# Patient Record
Sex: Male | Born: 1955 | Race: White | Hispanic: No | Marital: Married | Smoking: Former smoker
Health system: Southern US, Community
[De-identification: ages and names within clinical notes are randomized; demographics above are authoritative.]

## PROBLEM LIST (undated history)

## (undated) DIAGNOSIS — F419 Anxiety disorder, unspecified: Secondary | ICD-10-CM

## (undated) DIAGNOSIS — G4733 Obstructive sleep apnea (adult) (pediatric): Secondary | ICD-10-CM

## (undated) DIAGNOSIS — R030 Elevated blood-pressure reading, without diagnosis of hypertension: Secondary | ICD-10-CM

## (undated) DIAGNOSIS — R0609 Other forms of dyspnea: Secondary | ICD-10-CM

## (undated) DIAGNOSIS — IMO0002 Reserved for concepts with insufficient information to code with codable children: Secondary | ICD-10-CM

## (undated) DIAGNOSIS — Z0000 Encounter for general adult medical examination without abnormal findings: Secondary | ICD-10-CM

## (undated) DIAGNOSIS — E119 Type 2 diabetes mellitus without complications: Secondary | ICD-10-CM

## (undated) DIAGNOSIS — Z923 Personal history of irradiation: Secondary | ICD-10-CM

## (undated) DIAGNOSIS — H6091 Unspecified otitis externa, right ear: Secondary | ICD-10-CM

## (undated) DIAGNOSIS — J189 Pneumonia, unspecified organism: Secondary | ICD-10-CM

## (undated) DIAGNOSIS — R0989 Other specified symptoms and signs involving the circulatory and respiratory systems: Secondary | ICD-10-CM

## (undated) DIAGNOSIS — J301 Allergic rhinitis due to pollen: Secondary | ICD-10-CM

## (undated) DIAGNOSIS — T148XXA Other injury of unspecified body region, initial encounter: Secondary | ICD-10-CM

## (undated) DIAGNOSIS — M940 Chondrocostal junction syndrome [Tietze]: Secondary | ICD-10-CM

## (undated) DIAGNOSIS — E039 Hypothyroidism, unspecified: Secondary | ICD-10-CM

## (undated) DIAGNOSIS — E782 Mixed hyperlipidemia: Secondary | ICD-10-CM

## (undated) DIAGNOSIS — C4441 Basal cell carcinoma of skin of scalp and neck: Secondary | ICD-10-CM

## (undated) DIAGNOSIS — C801 Malignant (primary) neoplasm, unspecified: Secondary | ICD-10-CM

## (undated) DIAGNOSIS — M79661 Pain in right lower leg: Secondary | ICD-10-CM

## (undated) DIAGNOSIS — R0602 Shortness of breath: Secondary | ICD-10-CM

## (undated) DIAGNOSIS — E663 Overweight: Secondary | ICD-10-CM

## (undated) DIAGNOSIS — E118 Type 2 diabetes mellitus with unspecified complications: Secondary | ICD-10-CM

## (undated) DIAGNOSIS — E785 Hyperlipidemia, unspecified: Secondary | ICD-10-CM

## (undated) DIAGNOSIS — K219 Gastro-esophageal reflux disease without esophagitis: Secondary | ICD-10-CM

## (undated) DIAGNOSIS — B353 Tinea pedis: Secondary | ICD-10-CM

## (undated) DIAGNOSIS — E669 Obesity, unspecified: Secondary | ICD-10-CM

## (undated) DIAGNOSIS — R600 Localized edema: Secondary | ICD-10-CM

## (undated) DIAGNOSIS — E1165 Type 2 diabetes mellitus with hyperglycemia: Secondary | ICD-10-CM

## (undated) DIAGNOSIS — I10 Essential (primary) hypertension: Secondary | ICD-10-CM

## (undated) DIAGNOSIS — C4490 Unspecified malignant neoplasm of skin, unspecified: Secondary | ICD-10-CM

## (undated) DIAGNOSIS — C3490 Malignant neoplasm of unspecified part of unspecified bronchus or lung: Secondary | ICD-10-CM

## (undated) HISTORY — DX: Pneumonia, unspecified organism: J18.9

## (undated) HISTORY — DX: Other injury of unspecified body region, initial encounter: T14.8XXA

## (undated) HISTORY — PX: OTHER SURGICAL HISTORY: SHX169

## (undated) HISTORY — DX: Hyperlipidemia, unspecified: E78.5

## (undated) HISTORY — DX: Pain in right lower leg: M79.661

## (undated) HISTORY — DX: Malignant (primary) neoplasm, unspecified: C80.1

## (undated) HISTORY — DX: Other forms of dyspnea: R06.09

## (undated) HISTORY — DX: Allergic rhinitis due to pollen: J30.1

## (undated) HISTORY — DX: Shortness of breath: R06.02

## (undated) HISTORY — DX: Mixed hyperlipidemia: E78.2

## (undated) HISTORY — DX: Other specified symptoms and signs involving the circulatory and respiratory systems: R09.89

## (undated) HISTORY — DX: Obstructive sleep apnea (adult) (pediatric): G47.33

## (undated) HISTORY — DX: Overweight: E66.3

## (undated) HISTORY — DX: Type 2 diabetes mellitus with hyperglycemia: E11.65

## (undated) HISTORY — DX: Reserved for concepts with insufficient information to code with codable children: IMO0002

## (undated) HISTORY — PX: TONSILLECTOMY AND ADENOIDECTOMY: SHX28

## (undated) HISTORY — DX: Tinea pedis: B35.3

## (undated) HISTORY — DX: Basal cell carcinoma of skin of scalp and neck: C44.41

## (undated) HISTORY — DX: Encounter for general adult medical examination without abnormal findings: Z00.00

## (undated) HISTORY — DX: Localized edema: R60.0

## (undated) HISTORY — DX: Elevated blood-pressure reading, without diagnosis of hypertension: R03.0

## (undated) HISTORY — DX: Obesity, unspecified: E66.9

## (undated) HISTORY — DX: Anxiety disorder, unspecified: F41.9

## (undated) HISTORY — DX: Hypothyroidism, unspecified: E03.9

## (undated) HISTORY — DX: Unspecified otitis externa, right ear: H60.91

## (undated) HISTORY — DX: Type 2 diabetes mellitus with unspecified complications: E11.8

## (undated) HISTORY — DX: Chondrocostal junction syndrome (tietze): M94.0

## (undated) HISTORY — DX: Type 2 diabetes mellitus without complications: E11.9

---

## 2001-06-06 ENCOUNTER — Encounter: Admission: RE | Admit: 2001-06-06 | Discharge: 2001-09-04 | Payer: Self-pay | Admitting: Internal Medicine

## 2003-06-11 ENCOUNTER — Emergency Department (HOSPITAL_COMMUNITY): Admission: EM | Admit: 2003-06-11 | Discharge: 2003-06-11 | Payer: Self-pay

## 2003-09-07 ENCOUNTER — Encounter: Admission: RE | Admit: 2003-09-07 | Discharge: 2003-09-07 | Payer: Self-pay | Admitting: Family Medicine

## 2008-12-07 ENCOUNTER — Ambulatory Visit: Admission: RE | Admit: 2008-12-07 | Discharge: 2008-12-07 | Payer: Self-pay | Admitting: Family Medicine

## 2009-04-25 ENCOUNTER — Encounter: Payer: Self-pay | Admitting: Family Medicine

## 2009-10-23 ENCOUNTER — Ambulatory Visit: Payer: Self-pay | Admitting: Family Medicine

## 2009-10-23 DIAGNOSIS — E119 Type 2 diabetes mellitus without complications: Secondary | ICD-10-CM | POA: Insufficient documentation

## 2009-10-23 DIAGNOSIS — R0609 Other forms of dyspnea: Secondary | ICD-10-CM | POA: Insufficient documentation

## 2009-10-23 DIAGNOSIS — J301 Allergic rhinitis due to pollen: Secondary | ICD-10-CM

## 2009-10-23 DIAGNOSIS — G4733 Obstructive sleep apnea (adult) (pediatric): Secondary | ICD-10-CM

## 2009-10-23 DIAGNOSIS — E782 Mixed hyperlipidemia: Secondary | ICD-10-CM

## 2009-10-23 DIAGNOSIS — E663 Overweight: Secondary | ICD-10-CM

## 2009-10-23 DIAGNOSIS — Z9989 Dependence on other enabling machines and devices: Secondary | ICD-10-CM

## 2009-10-23 DIAGNOSIS — R0789 Other chest pain: Secondary | ICD-10-CM | POA: Insufficient documentation

## 2009-10-23 DIAGNOSIS — R0989 Other specified symptoms and signs involving the circulatory and respiratory systems: Secondary | ICD-10-CM

## 2009-10-23 HISTORY — DX: Other specified symptoms and signs involving the circulatory and respiratory systems: R09.89

## 2009-10-23 HISTORY — DX: Other specified symptoms and signs involving the circulatory and respiratory systems: R06.09

## 2009-10-23 HISTORY — DX: Overweight: E66.3

## 2009-10-23 HISTORY — DX: Mixed hyperlipidemia: E78.2

## 2009-10-23 HISTORY — DX: Obstructive sleep apnea (adult) (pediatric): G47.33

## 2009-10-23 HISTORY — DX: Type 2 diabetes mellitus without complications: E11.9

## 2009-10-23 HISTORY — DX: Allergic rhinitis due to pollen: J30.1

## 2009-10-25 LAB — CONVERTED CEMR LAB
Albumin: 4.4 g/dL (ref 3.5–5.2)
BUN: 18 mg/dL (ref 6–23)
Basophils Absolute: 0 10*3/uL (ref 0.0–0.1)
CO2: 28 meq/L (ref 19–32)
Calcium: 9 mg/dL (ref 8.4–10.5)
Chloride: 99 meq/L (ref 96–112)
Cholesterol: 166 mg/dL (ref 0–200)
Eosinophils Absolute: 0.2 10*3/uL (ref 0.0–0.7)
HCT: 44.8 % (ref 39.0–52.0)
HDL: 34 mg/dL — ABNORMAL LOW (ref >39.00)
Hemoglobin: 15.4 g/dL (ref 13.0–17.0)
Lymphs Abs: 2 10*3/uL (ref 0.7–4.0)
MCHC: 34.5 g/dL (ref 30.0–36.0)
MCV: 89.9 fL (ref 78.0–100.0)
Neutro Abs: 5 10*3/uL (ref 1.4–7.7)
RDW: 14.3 % (ref 11.5–14.6)
TSH: 2.39 microintl units/mL (ref 0.35–5.50)
Total Protein: 6.9 g/dL (ref 6.0–8.3)
VLDL: 99.4 mg/dL — ABNORMAL HIGH (ref 0.0–40.0)

## 2009-10-28 ENCOUNTER — Telehealth: Payer: Self-pay | Admitting: Family Medicine

## 2009-11-01 ENCOUNTER — Encounter: Payer: Self-pay | Admitting: Family Medicine

## 2009-11-04 ENCOUNTER — Encounter: Payer: Self-pay | Admitting: Family Medicine

## 2009-11-07 ENCOUNTER — Telehealth: Payer: Self-pay | Admitting: Family Medicine

## 2010-03-07 ENCOUNTER — Telehealth (INDEPENDENT_AMBULATORY_CARE_PROVIDER_SITE_OTHER): Payer: Self-pay | Admitting: *Deleted

## 2010-03-25 ENCOUNTER — Encounter (INDEPENDENT_AMBULATORY_CARE_PROVIDER_SITE_OTHER): Payer: Self-pay | Admitting: *Deleted

## 2010-03-25 ENCOUNTER — Emergency Department (HOSPITAL_COMMUNITY)
Admission: EM | Admit: 2010-03-25 | Discharge: 2010-03-26 | Payer: Self-pay | Source: Home / Self Care | Admitting: Emergency Medicine

## 2010-03-26 ENCOUNTER — Telehealth: Payer: Self-pay | Admitting: Family Medicine

## 2010-03-28 ENCOUNTER — Ambulatory Visit
Admission: RE | Admit: 2010-03-28 | Discharge: 2010-03-28 | Payer: Self-pay | Source: Home / Self Care | Attending: Family Medicine | Admitting: Family Medicine

## 2010-04-01 ENCOUNTER — Telehealth (INDEPENDENT_AMBULATORY_CARE_PROVIDER_SITE_OTHER): Payer: Self-pay | Admitting: *Deleted

## 2010-04-08 ENCOUNTER — Telehealth: Payer: Self-pay | Admitting: Family Medicine

## 2010-04-18 ENCOUNTER — Telehealth (INDEPENDENT_AMBULATORY_CARE_PROVIDER_SITE_OTHER): Payer: Self-pay | Admitting: *Deleted

## 2010-04-23 ENCOUNTER — Ambulatory Visit
Admission: RE | Admit: 2010-04-23 | Discharge: 2010-04-23 | Payer: Self-pay | Source: Home / Self Care | Attending: Family Medicine | Admitting: Family Medicine

## 2010-04-23 DIAGNOSIS — R030 Elevated blood-pressure reading, without diagnosis of hypertension: Secondary | ICD-10-CM | POA: Insufficient documentation

## 2010-04-23 DIAGNOSIS — J209 Acute bronchitis, unspecified: Secondary | ICD-10-CM | POA: Insufficient documentation

## 2010-04-23 HISTORY — DX: Elevated blood-pressure reading, without diagnosis of hypertension: R03.0

## 2010-04-24 ENCOUNTER — Telehealth: Payer: Self-pay | Admitting: Family Medicine

## 2010-04-25 ENCOUNTER — Telehealth: Payer: Self-pay | Admitting: Family Medicine

## 2010-04-29 NOTE — Assessment & Plan Note (Signed)
Summary: TO BE EST/NJR   Vital Signs:  Patient profile:   55 year old male Height:      78 inches (198.12 cm) Weight:      276 pounds (125.45 kg) BMI:     32.01 O2 Sat:      97 % on Room air Temp:     98.0 degrees F (36.67 degrees C) oral Pulse rate:   98 / minute BP sitting:   134 / 92  (left arm) Cuff size:   large  Vitals Entered By: Josph Macho RMA (October 23, 2009 8:08 AM)  O2 Flow:  Room air CC: Establis new pt/ CF Is Patient Diabetic? Yes   History of Present Illness: Patient in for new patient appt. His previous MD is leaving the area. He is a diabetic who has been having some right shoulder and atypical CP. He reports a roughly 3 week history of fleeting right shoulder pain, anterior and posteriorly, with some associated right upper chest wall pain which he says happens when he rotates his shoulder or does heavy lifting. The pain resolves when he moves his shoulder to a different position. He has an episode every few days and there are no associated symptoms such as SOB/palp/diaphoresis/nausea/heartburn. He does note about a 3 month history of mild increase in DOE, never at rest never awakening him from sleep.  He acknowledges doing a bad job of following a diabetic diet. He has been eating out a lot and eating lots of baked goods and carbs. His am sugars have been above 200 frequently in the past month  Preventive Screening-Counseling & Management  Alcohol-Tobacco     Alcohol drinks/day: <1     Smoking Status: quit  Caffeine-Diet-Exercise     Does Patient Exercise: no  Safety-Violence-Falls     Seat Belt Use: yes      Sexual History:  currently monogamous.        Drug Use:  never and no.    Current Problems (verified): 1)  Overweight  (ICD-278.02) 2)  Mixed Hyperlipidemia  (ICD-272.2) 3)  Sleep Apnea, Obstructive  (ICD-327.23) 4)  Allergic Rhinitis, Seasonal  (ICD-477.0)  Current Medications (verified): 1)  Januvia 100 Mg Tabs (Sitagliptin Phosphate) ....  Take 1 Tablet Once A Day 2)  Fenofibrate 160 Mg Tabs (Fenofibrate) .... Take 1 Tablet Once A Day 3)  Crestor 20 Mg Tabs (Rosuvastatin Calcium) .... Take 1/2-1 Tablet As Directed Once A Day At Bedtime 4)  Glipizide Xl 10 Mg Xr24h-Tab (Glipizide) .... Take 2 Tablets Every Day 5)  Metformin Hcl 500 Mg Tabs (Metformin Hcl) .... Take 2 Tablets Two Times A Day With Meals  Allergies (verified): No Known Drug Allergies  Past History:  Past Surgical History: Tonsillectomy & Adenoidectomy  Family History: Father: deceased@74 , stroke, hyperlipidemia, heart disease s/p valve replacement Mother: deceased@75 , CHF Siblings:  M1/2Brother: 46, hyperlipidemia M1/2Brother: 63< hyperlipidemia, overweight M1/2Brother: 60, overweight, back pain Brother: 7, panic attacks, hperlipidemia, HTN Brother: 14, hyperlidipemia MGM: deceased@79 , old age MGF: deceased in 72s PGM: deceased in 61s PGF: deceased in 55s Children: Son: 39, A&W  Social History: 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  The patient denies anorexia, fever, weight loss, weight gain, vision loss, decreased hearing, hoarseness, syncope, peripheral edema, prolonged cough, headaches, hemoptysis, abdominal pain, melena, hematochezia, severe indigestion/heartburn, hematuria, incontinence, muscle weakness, suspicious skin lesions, transient blindness, difficulty walking, depression, unusual weight change, abnormal bleeding, and enlarged lymph nodes.    Physical Exam  General:  Well-developed,well-nourished,in no acute distress; alert,appropriate and cooperative throughout examination Head:  Normocephalic and  atraumatic without obvious abnormalities. No apparent alopecia or balding. Eyes:  No corneal or conjunctival inflammation noted. EOMI. Perrla. Funduscopic exam benign, without hemorrhages, exudates or papilledema. Vision grossly normal. Ears:  External ear exam shows no significant lesions or deformities.  Otoscopic examination reveals clear canals, tympanic membranes are intact bilaterally without bulging, retraction, inflammation or discharge. Hearing is grossly normal bilaterally. Nose:  External nasal examination shows no deformity or inflammation. Nasal mucosa are pink and moist without lesions or exudates. Mouth:  Oral mucosa and oropharynx without lesions or exudates.  Teeth in good repair. Neck:  No deformities, masses, or tenderness noted. Lungs:  Normal respiratory effort, chest expands symmetrically. Lungs are clear to auscultation, no crackles or wheezes. Heart:  Normal rate and regular rhythm. S1 and S2 normal without gallop, murmur, click, rub or other extra sounds. Abdomen:  Bowel sounds positive,abdomen soft and non-tender without masses, organomegaly or hernias noted. Msk:  No deformity or scoliosis noted of thoracic or lumbar spine.   Pulses:  R and L carotid,radial,femoral,dorsalis pedis and posterior tibial pulses are full and equal bilaterally Extremities:  No clubbing, cyanosis, edema, or deformity noted with normal full range of motion of all joints.   Neurologic:  No cranial nerve deficits noted. Station and gait are normal. Plantar reflexes are down-going bilaterally. DTRs are symmetrical throughout. Sensory, motor and coordinative functions appear intact. Skin:  Intact without suspicious lesions or rashes. Scattered cherry angiomas and benign appearing freckles on trunk Cervical Nodes:  No lymphadenopathy noted Psych:  Cognition and judgment appear intact. Alert and cooperative with normal attention span and concentration. No apparent delusions, illusions,  hallucinations   Impression & Recommendations:  Problem # 1:  DIABETES MELLITUS, TYPE II (ICD-250.00)  His updated medication list for this problem includes:    Januvia 100 Mg Tabs (Sitagliptin phosphate) .Marland Kitchen... Take 1 tablet once a day    Glipizide Xl 10 Mg Xr24h-tab (Glipizide) .Marland Kitchen... Take 2 tablets every day    Metformin Hcl 500 Mg Tabs (Metformin hcl) .Marland Kitchen... Take 2 tablets two times a day with meals  Orders: TLB-Renal Function Panel (80069-RENAL) TLB-CBC Platelet - w/Differential (85025-CBCD) TLB-A1C / Hgb A1C (Glycohemoglobin) (83036-A1C) Cardiology Referral (Cardiology) Cardio-Pulmonary Stress Test Referral (Cardio-Pulmon) discussed at length need to avoid simple carbs, use complex carbs, lean proteins, increase exercise and monitor sugars daily and prn  Problem # 2:  CHEST PAIN, ATYPICAL (ICD-786.59)  Orders: Cardiology Referral (Cardiology) Cardio-Pulmonary Stress Test Referral (Cardio-Pulmon) Likely musculoskeletal but with significant risk factors will refer for further testing at this time  Problem # 3:  MIXED HYPERLIPIDEMIA (ICD-272.2)  His updated medication list for this problem includes:    Fenofibrate 160 Mg Tabs (Fenofibrate) .Marland Kitchen... Take 1 tablet once a day    Crestor 20 Mg Tabs (Rosuvastatin calcium) .Marland Kitchen... Take 1/2-1 tablet as directed once a day at bedtime  Orders: TLB-Renal Function Panel (80069-RENAL) TLB-Hepatic/Liver Function Pnl (80076-HEPATIC) TLB-Lipid Panel (80061-LIPID) TLB-A1C / Hgb A1C (Glycohemoglobin) (24401-U2V) Cardiology Referral (Cardiology) Cardio-Pulmonary Stress Test Referral (Cardio-Pulmon) Avoid trans fats, minimize saturated fats and await test results  Problem # 4:  OVERWEIGHT (  ICD-278.02)  Orders: TLB-TSH (Thyroid Stimulating Hormone) (03474-QVZ) Cardiology Referral (Cardiology) Cardio-Pulmonary Stress Test Referral (Cardio-Pulmon) Encouraged walking 30 minutes daily and decrease serving sizes  Problem # 5:  SLEEP APNEA,  OBSTRUCTIVE (ICD-327.23)  Orders: Cardiology Referral (Cardiology) Cardio-Pulmonary Stress Test Referral (Cardio-Pulmon) Continue CPAP use nightly  Complete Medication List: 1)  Januvia 100 Mg Tabs (Sitagliptin phosphate) .... Take 1 tablet once a day 2)  Fenofibrate 160 Mg Tabs (Fenofibrate) .... Take 1 tablet once a day 3)  Crestor 20 Mg Tabs (Rosuvastatin calcium) .... Take 1/2-1 tablet as directed once a day at bedtime 4)  Glipizide Xl 10 Mg Xr24h-tab (Glipizide) .... Take 2 tablets every day 5)  Metformin Hcl 500 Mg Tabs (Metformin hcl) .... Take 2 tablets two times a day with meals  Patient Instructions: 1)  Please schedule a follow-up appointment in 3 months .  2)  It is important that you exercise reguarly at least 20 minutes 5 times a week. If you develop chest pain, have severe difficulty breathing, or feel very tired, stop exercising immediately and seek medical attention.  3)  You need to lose weight. Consider a lower calorie diet and regular exercise.  4)  Check your blood sugars regularly. If your readings are usually above:  or below 70 you should contact our office.  5)  Check your feet each night  for sore areas, calluses or signs of infection.  6)  BMP prior to visit, ICD-9: 250.0 7)  Hepatic Panel prior to visit ICD-9: 272.0 8)  Lipid panel prior to visit ICD-9 : 272.0 9)  TSH prior to visit ICD-9 : 250.0 10)  CBC w/ Diff prior to visit ICD-9 : 250.0 11)  HgBA1c prior to visit  ICD-9: 250.0 12)  Urine Microalbumin prior to visit ICD-9 : 250.0 13)  Release of Records [facility], Dr Joselyn Arrow   Appended Document: Orders Update    Clinical Lists Changes  Orders: Added new Service order of Venipuncture (56387) - Signed Added new Service order of Specimen Handling (56433) - Signed

## 2010-04-29 NOTE — Progress Notes (Signed)
  Phone Note Call from Patient   Caller: Patient Call For: Danise Edge MD Summary of Call: Calling regarding stress test?  Please call [phone] Initial call taken by: [facility] CMA,  October 28, 2009 10:41 AM  Follow-up for Phone Call        I called pt and explained we were waiting to hear back from his Insurance co.  I will request consult appt for Cardiologist and call pt back with appt info. Follow-up by: Corky Mull,  October 28, 2009 11:30 AM

## 2010-04-29 NOTE — Miscellaneous (Signed)
Summary: Orders Update  Clinical Lists Changes  Orders: Added new Referral order of Cardiolite (Cardiolite) - Signed 

## 2010-04-29 NOTE — Progress Notes (Signed)
Summary: med refills  Phone Note Call from Patient Call back at Home Phone [phone]   Caller: Patient Call For: Danise Edge MD Summary of Call: pt needs metformin 500mg  ,crestor20 mg ,glipizde 10 mg xr,januvia 100 mg and fenofibrate 160 mg [phone] pt is out of meds Initial call taken by: Heron Sabins,  November 07, 2009 4:15 PM    Prescriptions: METFORMIN HCL 500 MG TABS (METFORMIN HCL) take 2 tablets two times a day with meals  #180 x 3   Entered by:   Lynann Beaver CMA   Authorized by:   Danise Edge MD   Signed by:   Lynann Beaver CMA on 11/07/2009   Method used:   [facility] [phone][facility] (retail)       [address]       [place], [place]  [address]       Ph: [phone] or [phone]       Fax: [phone]   RxID:   [id number] GLIPIZIDE XL 10 MG XR24H-TAB (GLIPIZIDE) Take 2 tablets every day  #90 x 3   Entered by:   Lynann Beaver CMA   Authorized by:   Danise Edge MD   Signed by:   Lynann Beaver CMA on 11/07/2009   Method used:   [facility]ically to        [facility] [phone]* (retail)       [address]       [place], [place]  [address]       Ph: [phone] or [phone]       Fax: [phone]   RxID:   [id number] CRESTOR 20 MG TABS (ROSUVASTATIN CALCIUM) take 1/2-1 tablet as directed once a day at bedtime  #90 x 3   Entered by:   Lynann Beaver CMA   Authorized by:   Danise Edge MD   Signed by:   Lynann Beaver CMA on 11/07/2009   Method used:   Electronically to        [facility] [phone]* (retail)       [address]       [place], [place]  [address]       Ph: [phone] or [phone]       Fax: [phone]   RxID:   [id number] FENOFIBRATE 160 MG TABS (FENOFIBRATE) take 1 tablet once a day  #90 x 3   Entered by:   Lynann Beaver CMA   Authorized by:   Danise Edge MD   Signed by:   Lynann Beaver CMA on 11/07/2009   Method used:    Electronically to        [facility] [phone]* (retail)       [address]       [place], [place]  [address]       Ph: [phone] or [phone]       Fax: [phone]   RxID:   [id number] JANUVIA 100 MG TABS (SITAGLIPTIN PHOSPHATE) take 1 tablet once a day  #90 x 3   Entered by:   Lynann Beaver CMA   Authorized by:   Danise Edge MD   Signed by:   Lynann Beaver CMA on 11/07/2009   Method used:   Electronically to  [facility]  [address] [phone]* (retail)       [address]       [place], [place]  [address]       Ph: [phone] or [phone]       Fax: [phone]   RxID:   [id number]

## 2010-04-29 NOTE — Letter (Signed)
Summary: Records from [facility] 2009 - 2011  Records from [facility] 2009 - 2011   Imported By: Maryln Gottron 11/07/2009 10:14:04  _____________________________________________________________________  External Attachment:    Type:   Image     Comment:   External Document

## 2010-04-29 NOTE — Progress Notes (Signed)
Summary: Flu vaccination    Immunization History:  Influenza Immunization History:    Influenza:  historical (02/11/2010)     Review of Systems       [facility] [place]

## 2010-04-29 NOTE — Miscellaneous (Signed)
Summary: Appointment Canceled  Appointment status changed to canceled by [employer] on 11/04/2009 12:03 PM.  Cancellation Comments --------------------- crs/dx:chest pain/wt:276/ins:bcbs/Dr blyth  Appointment Information ----------------------- Appt Type:  CARDIOLOGY NUCLEAR TESTING      Date:  Thursday, November 07, 2009      Time:  8:00 AM for 15 min   Urgency:  Routine   Made By:  Pearson Grippe  To Visit:  [id number]    Reason:  crs/dx:chest pain/wt:276/ins:bcbs/Dr blyth  Appt Comments ------------- -- 11/04/09 12:03: ([facility]) CANCELED -- crs/dx:chest pain/wt:276/ins:bcbs/Dr blyth -- 11/01/09 10:15: ([facility]EM[facility]) BOOKED -- Routine CARDIOLOGY NUCLEAR TESTING at 11/07/2009 8:00 AM for 15 min crs/dx:chest pain/wt:276/ins:bcbs/Dr blyth

## 2010-05-01 NOTE — Assessment & Plan Note (Signed)
Summary: DISCUSS ELEVATED BLOOD SUGARS//SP   Vital Signs:  Patient profile:   55 year old male Height:      78 inches Weight:      270 pounds BMI:     31.31 Pulse rate:   112 / minute BP sitting:   131 / 90  (right arm) Cuff size:   large  Vitals Entered By: Francee Piccolo CMA Duncan Dull) (March 28, 2010 8:35 AM) CC: discuss elevated blood sugars/SP Is Patient Diabetic? Yes   History of Present Illness: 55 y/o WM here for emergency dept visit follow up. Last o/v was 09/2009, at which time his HbA1c was 10% and he increased his metformin. Has been compliant with meds, says fasting glucose range is 240-270, occasional 2H PP check reveals same range.  No hypoglycemia.      Developed acute GI illness 03/22/10---n/v, abd cramps, subjective fever, malaise.  Two other family members developed the same symptoms.  He presented to [facility] ED 03/25/10, where he was given some IVF and phenergan, labs done (all normal except glucose was 340).  He has felt much better since then, feels almost back to normal now.  Didn't f/u as scheduled after 09/2009 visit b/c of some work issues that required extra long hours. No exercise.  Adheres to diabetic diet poorly most of the time (has "spurts" of doing good with this).  Medications Prior to Update: 1)  Januvia 100 Mg Tabs (Sitagliptin Phosphate) .... Take 1 Tablet Once A Day 2)  Fenofibrate 160 Mg Tabs (Fenofibrate) .... Take 1 Tablet Once A Day 3)  Crestor 20 Mg Tabs (Rosuvastatin Calcium) .... Take 1/2-1 Tablet As Directed Once A Day At Bedtime 4)  Glipizide Xl 10 Mg Xr24h-Tab (Glipizide) .... Take 2 Tablets Every Day 5)  Metformin Hcl 500 Mg Tabs (Metformin Hcl) .... Take 2 Tablets Two Times A Day With Meals  Current Medications (verified): 1)  Januvia 100 Mg Tabs (Sitagliptin Phosphate) .... Take 1 Tablet Once A Day 2)  Fenofibrate 160 Mg Tabs (Fenofibrate) .... Take 1 Tablet Once A Day 3)  Crestor 20 Mg Tabs (Rosuvastatin Calcium) .... Take 1/2-1  Tablet As Directed Once A Day At Bedtime 4)  Glipizide Xl 10 Mg Xr24h-Tab (Glipizide) .... Take 2 Tablets Every Day 5)  Metformin Hcl 500 Mg Tabs (Metformin Hcl) .... Take 2 Tablets Two Times A Day With Meals 6)  Promethazine Hcl 25 Mg Tabs (Promethazine Hcl) .... Take 1 Tablet By Mouth Every 6  Hours As Needed As Nausea  Allergies (verified): No Known Drug Allergies  Past History:  Past Surgical History: Last updated: 11-07-09 Tonsillectomy & Adenoidectomy  Family History: Last updated: 11-07-09 Father: deceased@74 , stroke, hyperlipidemia, heart disease s/p valve replacement Mother: deceased@75 , CHF Siblings:  M1/2Brother: 56, hyperlipidemia M1/2Brother: 63< hyperlipidemia, overweight M1/2Brother: 60, overweight, back pain Brother: 28, panic attacks, hperlipidemia, HTN Brother: 19, hyperlidipemia MGM: deceased@79 , old age MGF: deceased in 85s PGM: deceased in 90s PGF: deceased in 49s Children: Son: 74, A&W  Social History: Last updated: 11-07-09 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 Hyperlipidemia OSA Obesity  Review of Systems  The patient denies weight loss, weight gain, vision loss, decreased hearing, hoarseness, chest pain, syncope, dyspnea on exertion, peripheral edema, prolonged cough, hemoptysis, melena, hematochezia, hematuria, incontinence, genital sores,  muscle weakness, suspicious skin lesions, transient blindness, difficulty walking, depression, unusual weight change, abnormal bleeding, enlarged lymph nodes, angioedema, breast masses, and testicular masses.    Physical Exam  General:  VS: noted, all normal. Gen: Alert, well appearing, oriented x 4. HEENT: Scalp without lesions or hair  loss.  Ears: EACs clear, normal epithelium.  TMs with good light reflex and landmarks bilaterally.  Eyes: no injection, icteris, swelling, or exudate.  EOMI, PERRLA. Nose: no drainage or turbinate edema/swelling.  No inection or focal lesion.  Mouth: lips without lesion/swelling.  Oral mucosa pink and moist.  Dentition intact and without obvious caries or gingival swelling.  Oropharynx without erythema, exudate, or swelling.  Neck: supple.  No lymphadenopathy, thyromegaly, or mass. Chest: symmetric expansion, with nonlabored respirations.  Clear and equal breath sounds in all lung fields.   CV: RRR, no m/r/g.  Peripheral pulses 2+/symmetric. ABD: soft, NT, ND, BS normal.  No hepatospenomegaly or mass.  No bruits. EXT: no clubbing, cyanosis, or edema.     Impression & Recommendations:  Problem # 1:  GASTROENTERITIS, ACUTE (ICD-558.9) Assessment New This has now resolved.  Entire record from ED visit 03/25/10 was reviewed today.  Problem # 2:  DIABETES MELLITUS, TYPE II (ICD-250.00) Assessment: Deteriorated His control continues to be poor.  Somewhat poor compliance with f/u. Discussed options and decided to d/c januvia and glipizide and start lantus at 10 units at bedtime. We discussed titration of lantus to get to a goal fasting glucose range of 100-110. Continue metformin 1000 mg two times a day.  Elected NOT to do HbA1c or lipids today. He'll call in 2 wks with report of his glucoses, and he'll return to see Dr. Abner Greenspan for o/v in 6 wks.  The following medications were removed from the medication list:    Januvia 100 Mg Tabs (Sitagliptin phosphate) .Marland Kitchen... Take 1 tablet once a day    Glipizide Xl 10 Mg Xr24h-tab (Glipizide) .Marland Kitchen... Take 2 tablets every day His updated medication list for this problem includes:    Metformin Hcl 500 Mg Tabs (Metformin hcl) .Marland Kitchen... Take 2 tablets two times a day with meals    Lantus Solostar 100 Unit/ml Soln (Insulin glargine) .Marland KitchenMarland KitchenMarland KitchenMarland Kitchen 10 u subq at  bedtime  Complete Medication List: 1)  Fenofibrate 160 Mg Tabs (Fenofibrate) .... Take 1 tablet once a day 2)  Crestor 20 Mg Tabs (Rosuvastatin calcium) .... Take 1/2-1 tablet as directed once a day at bedtime 3)  Metformin Hcl 500 Mg Tabs (Metformin hcl) .... Take 2 tablets two times a day with meals 4)  Promethazine Hcl 25 Mg Tabs (Promethazine hcl) .... Take 1 tablet by mouth every 6  hours as needed as nausea 5)  Lantus Solostar 100 Unit/ml Soln (Insulin glargine) .Marland Kitchen.. 10 u subq at bedtime  Patient Instructions: 1)  Arrange f/u appt in 6 wks with Dr. Abner Greenspan. 2)  Call in 2 wks and report your blood sugars to Dr. Mariel Aloe nurse. 3)  Call or return as needed for any problems. Prescriptions: LANTUS SOLOSTAR 100 UNIT/ML SOLN (INSULIN GLARGINE) 10 U subQ at bedtime  #1 box x 3   Entered and Authorized by:   Michell Heinrich M.D.   Signed by:   Michell Heinrich M.D. on 03/28/2010   Method used:   [facility] to        [facility] [phone]* (retail)       [address]       [place]  [place], [place]  [address]       Ph: [phone] or [phone]       Fax: [phone]   RxID:   [id number]    Orders Added: 1)  Est. Patient Level IV [03474]

## 2010-05-01 NOTE — Progress Notes (Signed)
Summary: Lantus Refill  Phone Note Refill Request Call back at Work Phone [phone] Message from:  Patient on April 24, 2010 9:25 AM  pt only received 1 box of Lantus which will only last 5 days, he needs more   Method Requested: Electronic Initial call taken by: Lannette Donath,  April 24, 2010 9:27 AM  Follow-up for Phone Call        I left a message for pt to return my call. RX that was sent for 3 pens X3? Follow-up by: Josph Macho RMA,  April 24, 2010 10:04 AM  Additional Follow-up for Phone Call Additional follow up Details #1::        Pt states he received a box with 5 pens. Pt states there is 100 units in each pen. Pt needs a new RX sent to pharmacy? Additional Follow-up by: Josph Macho RMA,  April 24, 2010 11:53 AM    Additional Follow-up for Phone Call Additional follow up Details #2::    rewrote rx for 4 boxes instead of pens and resent to pharmacy, please check with pharmacy and notify patient Follow-up by: Danise Edge MD,  April 24, 2010 12:14 PM  Additional Follow-up for Phone Call Additional follow up Details #3:: Details for Additional Follow-up Action Taken: Spoke with pharmacist and pen has 300 units in it and pt was given 5 pens. Pharmacist states that is enough for roughly 24 days. Pharmacist states that she believes pts insurance wouldn' t pay for anymore? Pharmacist is going to look into it further.  Pt informed and is going to contact pharmacy. Additional Follow-up by: Josph Macho RMA,  April 24, 2010 12:37 PM  Prescriptions: LANTUS SOLOSTAR 100 UNIT/ML SOLN (INSULIN GLARGINE) 50 units subcutaneously in am 10 units subcutaneously in pm  #4 boxes x 3   Entered and Authorized by:   Danise Edge MD   Signed by:   Danise Edge MD on 04/24/2010   Method used:   Electronically to        [facility] [phone]* (retail)       [address]       [place], [place]  [address]       Ph: [phone]       Fax:  [phone]   RxID:   [id number]

## 2010-05-01 NOTE — Progress Notes (Signed)
Summary: question about high blood sugar  Phone Note Call from Patient Call back at Work Phone [phone]   Caller: Patient Reason for Call: Talk to Nurse Summary of Call: Pt went to the hospital 03/25/10 and blood sugar 340, pls call, pt has questions about how to give himself insulin Initial call taken by: Lannette Donath,  March 26, 2010 4:32 PM  Follow-up for Phone Call        [facility] to pt.  Pt was seen in ER on 12/27 for nausea and vomiting.  Pt was given IV fluids and phenergan and feels somewhat better today.   Pt was also noted to have an elevated glucose.  Pt was given insulin while in the ED.  Pt states he continues to take oral diabetic meds.   Pt will come for appt on 12/30 to see Dr. Milinda Cave to eval elevated blood sugars. Follow-up by: Francee Piccolo CMA Duncan Dull),  March 26, 2010 5:00 PM

## 2010-05-01 NOTE — Assessment & Plan Note (Signed)
Summary: follow up, meds ,sugar check/vfw   Vital Signs:  Patient profile:   55 year old male Height:      78 inches (198.12 cm) Weight:      278.50 pounds (126.59 kg) O2 Sat:      95 % on Room air Temp:     97.4 degrees F (36.33 degrees C) oral Pulse rate:   90 / minute BP sitting:   145 / 97  (right arm) Cuff size:   large  Vitals Entered By: Josph Macho RMA (April 23, 2010 8:25 AM)  O2 Flow:  Room air  Serial Vital Signs/Assessments:  Time      Position  BP       Pulse  Resp  Temp     By                     148/90                         Danise Edge MD  CC: Follow up visit, meds, sugar- 260 in am, 260 in pm/ CF Is Patient Diabetic? Yes   History of Present Illness: Patient is a 55 yo Caucasian male in today for follow up on multiple medical problems and diabetes. He has changed his Lantus to am and is up to 52 units but continues to numbers consistently in the 200s. Yesterday in am and in pm it was 260. The lowest he has seen is 190 once. his numbers are down from the 300s and he does report feeling less fatigued and is struggling for his polyuria and polydipsia. He has been struggling with congestion going on 2 weeks now. He notes some dry cough irritated throat some increased shortness of breath. He has been using nyquil intermittently and did use it last night and he does believe it helps him sleep better. Denies fevers, chills, rhinorrhea, ear pain, headache, GI or GU complaints. He has tried to move more taking some short walks and is trying to avoid simple carbs and heavy meals. He is eating smaller amounts more frequently does believe that is helping him to feel somewhat better.  Anticoagulation Management History:      Positive risk factors for bleeding include presence of serious comorbidities.  Negative risk factors for bleeding include an age less than 57 years old.  The bleeding index is 'intermediate risk'.  Positive CHADS2 values include History of Diabetes.   Negative CHADS2 values include Age > 62 years old.     Current Medications (verified): 1)  Fenofibrate 160 Mg Tabs (Fenofibrate) .... Take 1 Tablet Once A Day 2)  Crestor 20 Mg Tabs (Rosuvastatin Calcium) .... Take 1/2-1 Tablet As Directed Once A Day At Bedtime 3)  Metformin Hcl 500 Mg Tabs (Metformin Hcl) .... Take 2 Tablets Two Times A Day With Meals 4)  Promethazine Hcl 25 Mg Tabs (Promethazine Hcl) .... Take 1 Tablet By Mouth Every 6  Hours As Needed As Nausea 5)  Lantus Solostar 100 Unit/ml Soln (Insulin Glargine) .... 52 U Subq in Am 6)  Accu-Chek Multiclix Lancets  Misc (Lancets) .... Use As Directed 7)  Accu-Chek Aviva  Strp (Glucose Blood) .... Check 3-4 Times A Day-Use As Directed  Allergies (verified): No Known Drug Allergies  Past History:  Past medical history reviewed for relevance to current acute and chronic problems. Social history (including risk factors) reviewed for relevance to current acute and chronic problems.  Past Medical History:  Reviewed history from 03/28/2010 and no changes required. DM 2, dx'd approx 2003. Hyperlipidemia OSA Obesity  Social History: Reviewed history from 10/23/2009 and no changes required. Occupation: deliver auto parts for [employer] Married Former Smoker started @18  stopped @ 27, 1ppd Alcohol use-yes, rarely Drug use-no  Review of Systems      See HPI  Physical Exam  General:  Well-developed,well-nourished,in no acute distress; alert,appropriate and cooperative throughout examination Head:  Normocephalic and atraumatic without obvious abnormalities. No apparent alopecia or balding. Mouth:  Oral mucosa and oropharynx without lesions or exudates.  Teeth in good repair. Mild erythema in oropharynx Neck:  No deformities, masses, or tenderness noted. Lungs:  Normal respiratory effort, chest expands symmetrically. Lungs with good aeration but scattered rhonchi Heart:  Normal rate and regular rhythm. S1 and S2 normal without gallop,  murmur, click, rub or other extra sounds. Abdomen:  Bowel sounds positive,abdomen soft and non-tender without masses, organomegaly or hernias noted. Msk:  No deformity or scoliosis noted of thoracic or lumbar spine.   Extremities:  No clubbing, cyanosis, edema, or deformity noted with normal full range of motion of all joints.   Skin:  Intact without suspicious lesions or rashes. Psych:  Cognition and judgment appear intact. Alert and cooperative with normal attention span and concentration. No apparent delusions, illusions, hallucinations   Impression & Recommendations:  Problem # 1:  DIABETES MELLITUS, TYPE II (ICD-250.00)  His updated medication list for this problem includes:    Metformin Hcl 500 Mg Tabs (Metformin hcl) .Marland Kitchen... Take 2 tablets two times a day with meals    Lantus Solostar 100 Unit/ml Soln (Insulin glargine) .Marland KitchenMarland KitchenMarland KitchenMarland Kitchen 50 units subcutaneously in am 10 units subcutaneously in pm    Lisinopril 10 Mg Tabs (Lisinopril) .Marland Kitchen... 1 tab by mouth daily, increase by 2 units every 3 days start by increasing in am and 3 days later increase pm dose if indicated Avoid simple carbs, use lean proteins, small frequent meals, increase exercise call with any concerning numbers  Problem # 2:  ELEVATED BLOOD PRESSURE (ICD-796.2)  His updated medication list for this problem includes:    Lisinopril 10 Mg Tabs (Lisinopril) .Marland Kitchen... 1 tab by mouth daily Avoid sodium, DM, D and Nyquil  Problem # 3:  GASTROENTERITIS, ACUTE (ICD-558.9) Resolved, no concerns  Problem # 4:  ACUTE BRONCHITIS (ICD-466.0)  His updated medication list for this problem includes:    Zithromax 250 Mg Tabs (Azithromycin) .Marland Kitchen... 2 tabs by mouth once and then 1 tab by mouth daily x 4 day    Mucinex 600 Mg Xr12h-tab (Guaifenesin) .Marland Kitchen... 1 tab by mouth two times a day x 10 day Stop Nyquil  Problem # 5:  ALLERGIC RHINITIS, SEASONAL (ICD-477.0) May cont Zyrtec in am and try Diphenhydramine at bedtime as needed   Problem # 6:  MIXED  HYPERLIPIDEMIA (ICD-272.2)  His updated medication list for this problem includes:    Fenofibrate 160 Mg Tabs (Fenofibrate) .Marland Kitchen... Take 1 tablet once a day    Crestor 20 Mg Tabs (Rosuvastatin calcium) .Marland Kitchen... Take 1/2-1 tablet as directed once a day at bedtime Avoid trans fats and continue efforts at weight loss and increased exercise  Complete Medication List: 1)  Fenofibrate 160 Mg Tabs (Fenofibrate) .... Take 1 tablet once a day 2)  Crestor 20 Mg Tabs (Rosuvastatin calcium) .... Take 1/2-1 tablet as directed once a day at bedtime 3)  Metformin Hcl 500 Mg Tabs (Metformin hcl) .... Take 2 tablets two times a day with meals 4)  Promethazine  Hcl 25 Mg Tabs (Promethazine hcl) .... Take 1 tablet by mouth every 6  hours as needed as nausea 5)  Lantus Solostar 100 Unit/ml Soln (Insulin glargine) .... 50 units subcutaneously in am 10 units subcutaneously in pm 6)  Accu-chek Multiclix Lancets Misc (Lancets) .... Use as directed 7)  Accu-chek Aviva Strp (Glucose blood) .... Check 3-4 times a day-use as directed 8)  Zithromax 250 Mg Tabs (Azithromycin) .... 2 tabs by mouth once and then 1 tab by mouth daily x 4 day 9)  Benadryl 25 Mg Tabs (Diphenhydramine hcl) .Marland Kitchen.. 1-2 tabs by mouth at bedtime as needed congestion/insomnia 10)  Mucinex 600 Mg Xr12h-tab (Guaifenesin) .Marland Kitchen.. 1 tab by mouth two times a day x 10 day 11)  Left Knee Soft Brace  .... Wear daily dx: knee instability 12)  Bd Ultra-fine Pen Needles, Mini 31 Gauge, 5mm  .... Use as directed to administer lantus two times a day 13)  Lisinopril 10 Mg Tabs (Lisinopril) .Marland Kitchen.. 1 tab by mouth daily 14)  Zyrtec Allergy 10 Mg Tabs (Cetirizine hcl) .Marland Kitchen.. 1 tab by mouth once daily as needed allergies  Patient Instructions: 1)  Please schedule a follow-up appointment in 1 month or as needed 2)  Avoid simple carbs, continue to exercise, track blood sugars carefully 3)  Increase Lantus as directed 4)  Take your antibiotic as prescribed until ALL of it is gone,  but stop if you develop a rash or swelling and contact our office as soon as possible.  5)  Acute Bronchitis symptoms for less then 10 days are not  helped by antibiotics. Take over the counter cough medications. Call if no improvement in 5-7 days, sooner if increasing cough, fever, or new symptoms ( shortness of breath, chest pain) .  6)  Start  Benefiber and use Almonds with breakfast in am. Prescriptions: LISINOPRIL 10 MG TABS (LISINOPRIL) 1 tab by mouth daily  #30 x 3   Entered and Authorized by:   Danise Edge MD   Signed by:   Danise Edge MD on 04/23/2010   Method used:   [facility] to        [facility] [phone]* (retail)       [address]       [place], [place]  [address]       Ph: [phone]       Fax: [phone]   RxID:   [id number] BD ULTRA-FINE PEN NEEDLES, MINI 31 GAUGE, Use as directed to administer Lantus two times a day  #1 box x 5   Entered and Authorized by:   Danise Edge MD   Signed by:   Danise Edge MD on 04/23/2010   Method used:   Faxed to ...       [facility]  [address] [phone]* (retail)       [address]       [place], [place]  [address]       Ph: [phone]       Fax: [phone]   RxID:   [id number] LEFT KNEE SOFT BRACE wear daily dx: knee instability  #1 x 0   Entered and Authorized by:   Danise Edge MD   Signed by:   Danise Edge MD on 04/23/2010   Method used:   Print then Give to Patient   RxID:   [id number] LANTUS SOLOSTAR 100 UNIT/ML SOLN (INSULIN GLARGINE) 50 units subcutaneously  in am 10 units subcutaneously in pm  #3 pens x 5   Entered and Authorized by:   Danise Edge MD   Signed by:   Danise Edge MD on 04/23/2010   Method used:   Electronically to        [facility]. [phone]* (retail)       [address]       [place], [place]  [address]       Ph: [phone]       Fax: [phone]   RxID:   [id number] ZITHROMAX 250 MG TABS (AZITHROMYCIN) 2 tabs by  mouth once and then 1 tab by mouth daily x 4 day  #6 x 0   Entered and Authorized by:   Danise Edge MD   Signed by:   Danise Edge MD on 04/23/2010   Method used:   Electronically to        [facility] [phone]* (retail)       [address]       [place], [place]  [address]       Ph: [phone]       Fax: [phone]   RxID:   [id number] FENOFIBRATE 160 MG TABS (FENOFIBRATE) take 1 tablet once a day  #90 x 3   Entered and Authorized by:   Danise Edge MD   Signed by:   Danise Edge MD on 04/23/2010   Method used:   Electronically to        [facility]. [phone]* (retail)       [address]       [place], [place]  [address]       Ph: [phone]       Fax: [phone]   RxID:   [id number] ACCU-CHEK AVIVA  STRP (GLUCOSE BLOOD) check 3-4 times a day-use as directed  #1 month x 3   Entered by:   Josph Macho RMA   Authorized by:   Danise Edge MD   Signed by:   Josph Macho RMA on 04/23/2010   Method used:   Electronically to        [facility]  [address] [phone]* (retail)       [address]       [place], [place]  [address]       Ph: [phone]       Fax: [phone]   RxID:   [id number] ACCU-CHEK MULTICLIX LANCETS  MISC (LANCETS) use as directed  #1 month x 3   Entered by:   Josph Macho RMA   Authorized by:   Danise Edge MD   Signed by:   Josph Macho RMA on 04/23/2010   Method used:   Electronically to        [facility]  [address] [phone]* (retail)       [address]       [place], [place]  [address]       Ph: [phone]       Fax: [phone]   RxID:   [id number]    Orders Added: 1)  Est. Patient Level IV [89211]

## 2010-05-01 NOTE — Progress Notes (Signed)
Summary: Insulin to [facility]  Phone Note Call from Patient   Summary of Call: Pt called stating he would like his Lantus to go to [facility]. He would like a 90 day supply sent there.  Initial call taken by: Josph Macho RMA,  April 25, 2010 9:32 AM    Prescriptions: LANTUS SOLOSTAR 100 UNIT/ML SOLN (INSULIN GLARGINE) 50 units subcutaneously in am 10 units subcutaneously in pm  #4 boxes x 3   Entered by:   Josph Macho RMA   Authorized by:   Danise Edge MD   Signed by:   Josph Macho RMA on 04/25/2010   Method used:   Faxed to ...       [facility] (mail-order)       [address] [place], [place]  [address]       Ph: [phone]       Fax: [phone]   RxID:   [id number]

## 2010-05-01 NOTE — Progress Notes (Signed)
Summary: Blood Sugar level  Phone Note Other Incoming Call back at Work Phone [phone]   Summary of Call: Pt called stating that his BS are usually in the 300's every now and then its around 250. Pt states he bumped his Lantus up to 13 last night, but BS was 275 when patient woke up and two hours after eating BS was 335. Pt states he is having loose bowel movements? Pt is wandering if this is a side effect from the Lantus? Pt would like a call back to [phone]. Initial call taken by: Josph Macho RMA,  April 01, 2010 5:02 PM Summary of Call: Patient informed Initial call taken by: Josph Macho RMA,  April 02, 2010 10:17 AM  Follow-up for Phone Call        Tell him to continue increasing his Lantus. His loose stools are not coming from this. He likely has a little bit of short term malabsorption that you can get from an acute case of gastroenteritis like he recently had.  This malabsorption can lead to loose stools for days or even weeks after the acute GI illness.  Tell him it may be helpful to avoid dairy products (milk, cheese, butter, cream) for a few weeks and take an over the counter probiotic (any) daily for a couple of weeks. Follow-up by: Michell Heinrich M.D.,  April 01, 2010 5:52 PM     Appended Document: Blood Sugar level Patient informed.

## 2010-05-01 NOTE — Progress Notes (Signed)
Summary: cold symptoms  Phone Note Call from Patient Call back at Work Phone [phone]   Summary of Call: Pt states he has a dry cough and sinus drainage X3-4 days. Pt states he feels like its settling in his chest. Pt would like to know if anything could be called in? Or could MD suggest anything over the counter. Pt has appt next Wed. If anything gets called [facility] [place]. Initial call taken by: Josph Macho RMA,  April 18, 2010 3:15 PM  Follow-up for Phone Call        Mucinex 600mg  by mouth two times a day x 10 days, increase clear fluids and consider a Zyrtec 10 mg daily, if symptoms persist fo r10 plus days then would be willing to call in abx or if hi fevers, green sputum develop Follow-up by: Danise Edge MD,  April 18, 2010 3:19 PM  Additional Follow-up for Phone Call Additional follow up Details #1::        Pt informed Additional Follow-up by: Josph Macho RMA,  April 18, 2010 3:57 PM

## 2010-05-01 NOTE — Progress Notes (Signed)
Summary: Pt's sugars are too high  Phone Note Call from Patient Call back at Work Phone [phone]   Caller: Patient Reason for Call: Talk to Nurse Summary of Call: pt states his meds are not "bringing his sugars down", he says his numbers are staying around 300 Initial call taken by: Lannette Donath,  April 08, 2010 9:34 AM  Follow-up for Phone Call        Pt states he is now up to 30 on his Lantus once daily. Pt is taking Lantus at bedtime. Pt wants to know if he should start the Januvia again? Pt is taking Metformin two times a day. Pt states we can leave a message on vm [phone]. Follow-up by: Josph Macho RMA,  April 08, 2010 9:44 AM  Additional Follow-up for Phone Call Additional follow up Details #1::        continue to monitor and record FSG and food intake, switch Lantus to am and increase to 32 units, continue to increase Lantust by 2 units every 3 days until bs consistently below 200, can refer to nutritionist and/or endocrinolgy for further education. Avoid simple carbs, eat small, frequent meals with lean proteins and brown carbs. Come in next week with sugar and food log Additional Follow-up by: Danise Edge MD,  April 08, 2010 2:29 PM    Additional Follow-up for Phone Call Additional follow up Details #2::    After speaking with MD do 32 today at 4pm and 32 units in the morning. Then on Thursday am go to 34. Pt informed all information and states he will call tomorrow to schedule an appt. Follow-up by: Josph Macho RMA,  April 08, 2010 2:37 PM

## 2010-05-09 ENCOUNTER — Encounter: Payer: Self-pay | Admitting: Family Medicine

## 2010-05-12 ENCOUNTER — Telehealth: Payer: Self-pay | Admitting: Family Medicine

## 2010-05-21 NOTE — Progress Notes (Signed)
Summary: Metformin refill  Phone Note Refill Request Message from:  Fax from Pharmacy on May 12, 2010 5:01 PM  Refills Requested: Medication #1:  METFORMIN HCL 500 MG TABS take 2 tablets two times a day with meals Initial call taken by: Josph Macho RMA,  May 12, 2010 5:01 PM    Prescriptions: METFORMIN HCL 500 MG TABS (METFORMIN HCL) take 2 tablets two times a day with meals  #180 x 3   Entered by:   Josph Macho RMA   Authorized by:   Danise Edge MD   Signed by:   Josph Macho RMA on 05/12/2010   Method used:   Electronically to        [facility] [phone]* (retail)       [address]       [place], [place]  [address]       Ph: [phone]       Fax: [phone]   RxID:   [id number]

## 2010-06-09 LAB — BASIC METABOLIC PANEL
BUN: 18 mg/dL (ref 6–23)
Chloride: 96 mEq/L (ref 96–112)
Creatinine, Ser: 0.82 mg/dL (ref 0.4–1.5)
GFR calc Af Amer: >60 mL/min (ref >60)
GFR calc non Af Amer: >60 mL/min (ref >60)

## 2010-06-09 LAB — DIFFERENTIAL
Basophils Absolute: 0 K/uL (ref 0.0–0.1)
Basophils Relative: 0 % (ref 0–1)
Eosinophils Absolute: 0.1 K/uL (ref 0.0–0.7)
Eosinophils Relative: 2 % (ref 0–5)
Lymphocytes Relative: 23 % (ref 12–46)
Lymphs Abs: 1.6 K/uL (ref 0.7–4.0)
Monocytes Absolute: 0.7 K/uL (ref 0.1–1.0)
Monocytes Relative: 10 % (ref 3–12)
Neutro Abs: 4.3 K/uL (ref 1.7–7.7)
Neutrophils Relative %: 65 % (ref 43–77)

## 2010-06-09 LAB — URINALYSIS, ROUTINE W REFLEX MICROSCOPIC
Bilirubin Urine: NEGATIVE
Glucose, UA: >1000 mg/dL — AB
Hgb urine dipstick: NEGATIVE
Ketones, ur: 15 mg/dL — AB
Leukocytes, UA: NEGATIVE
Nitrite: NEGATIVE
Protein, ur: NEGATIVE mg/dL
Specific Gravity, Urine: 1.02 (ref 1.005–1.030)
Urobilinogen, UA: 0.2 mg/dL (ref 0.0–1.0)
pH: 5 (ref 5.0–8.0)

## 2010-06-09 LAB — CBC
HCT: 42.1 % (ref 39.0–52.0)
Hemoglobin: 15.6 g/dL (ref 13.0–17.0)
MCH: 30.3 pg (ref 26.0–34.0)
MCHC: 37.1 g/dL — ABNORMAL HIGH (ref 30.0–36.0)
MCV: 81.7 fL (ref 78.0–100.0)
Platelets: 176 K/uL (ref 150–400)
RBC: 5.15 MIL/uL (ref 4.22–5.81)
RDW: 13.2 % (ref 11.5–15.5)
WBC: 6.7 K/uL (ref 4.0–10.5)

## 2010-06-09 LAB — GLUCOSE, CAPILLARY: Glucose-Capillary: 327 mg/dL — ABNORMAL HIGH (ref 70–99)

## 2010-06-09 LAB — URINE MICROSCOPIC-ADD ON

## 2010-07-10 ENCOUNTER — Encounter: Payer: Self-pay | Admitting: Family Medicine

## 2010-07-12 ENCOUNTER — Other Ambulatory Visit: Payer: Self-pay | Admitting: Family Medicine

## 2010-07-18 ENCOUNTER — Encounter: Payer: Self-pay | Admitting: Family Medicine

## 2010-07-18 ENCOUNTER — Ambulatory Visit (INDEPENDENT_AMBULATORY_CARE_PROVIDER_SITE_OTHER): Payer: BC Managed Care – PPO | Admitting: Family Medicine

## 2010-07-18 DIAGNOSIS — E785 Hyperlipidemia, unspecified: Secondary | ICD-10-CM

## 2010-07-18 DIAGNOSIS — I10 Essential (primary) hypertension: Secondary | ICD-10-CM

## 2010-07-18 DIAGNOSIS — E782 Mixed hyperlipidemia: Secondary | ICD-10-CM

## 2010-07-18 DIAGNOSIS — E119 Type 2 diabetes mellitus without complications: Secondary | ICD-10-CM

## 2010-07-18 DIAGNOSIS — E663 Overweight: Secondary | ICD-10-CM

## 2010-07-18 DIAGNOSIS — R0789 Other chest pain: Secondary | ICD-10-CM

## 2010-07-18 DIAGNOSIS — R0989 Other specified symptoms and signs involving the circulatory and respiratory systems: Secondary | ICD-10-CM

## 2010-07-18 DIAGNOSIS — R059 Cough, unspecified: Secondary | ICD-10-CM

## 2010-07-18 DIAGNOSIS — R030 Elevated blood-pressure reading, without diagnosis of hypertension: Secondary | ICD-10-CM

## 2010-07-18 DIAGNOSIS — R05 Cough: Secondary | ICD-10-CM

## 2010-07-18 DIAGNOSIS — J209 Acute bronchitis, unspecified: Secondary | ICD-10-CM

## 2010-07-18 DIAGNOSIS — R0609 Other forms of dyspnea: Secondary | ICD-10-CM

## 2010-07-18 DIAGNOSIS — G4733 Obstructive sleep apnea (adult) (pediatric): Secondary | ICD-10-CM

## 2010-07-18 DIAGNOSIS — J301 Allergic rhinitis due to pollen: Secondary | ICD-10-CM

## 2010-07-18 MED ORDER — LOSARTAN POTASSIUM 25 MG PO TABS: 25.0000 mg | ORAL_TABLET | Freq: Every day | ORAL | Status: DC

## 2010-07-18 MED ORDER — INSULIN GLARGINE 100 UNIT/ML ~~LOC~~ SOLN: SUBCUTANEOUS | Status: DC

## 2010-07-18 MED ORDER — RANITIDINE HCL 300 MG PO TABS: 300.0000 mg | ORAL_TABLET | Freq: Every day | ORAL | Status: DC

## 2010-07-18 MED ORDER — CIPROFLOXACIN HCL 500 MG PO TABS: 500.0000 mg | ORAL_TABLET | Freq: Two times a day (BID) | ORAL | Status: AC

## 2010-07-18 MED ORDER — ROSUVASTATIN CALCIUM 20 MG PO TABS: 20.0000 mg | ORAL_TABLET | Freq: Every day | ORAL | Status: DC

## 2010-07-18 MED ORDER — METFORMIN HCL ER (MOD) 500 MG PO TB24: ORAL_TABLET | ORAL | Status: DC

## 2010-07-18 MED ORDER — FENOFIBRATE 160 MG PO TABS: 160.0000 mg | ORAL_TABLET | Freq: Every day | ORAL | Status: DC

## 2010-07-18 NOTE — Patient Instructions (Addendum)
Diabetes and Exercise Regular exercise is important and can help:   Control blood glucose (sugar).   Decrease blood pressure.   Control blood lipids (cholesterol and triglycerides).   Improve overall health.  BENEFITS FROM EXERCISE:  Improved fitness.  Improved flexibility. Diabetes, Type 2 Diabetes is a lasting (chronic) disease. In type 2 diabetes, the pancreas does not make enough insulin (a hormone), and the body does not respond normally to the insulin that is made. This type of diabetes was also previously called adult onset diabetes. About 90% of all those who have diabetes have type 2. It usually occurs after the age of 74 but can occur at any age. CAUSES Unlike type 1 diabetes, which happens because insulin is no longer being made, type 2 diabetes happens because the body is making less insulin and has trouble using the insulin properly. SYMPTOMS Drinking more than usual.  Urinating more than usual.  Blurred vision.  Dry, itchy skin.  Frequent infection like yeast infections in women.  More tired than usual (fatigue).  TREATMENT Healthy eating.  Exercise.  Medication, if needed.  Monitoring blood glucose (sugar).  Seeing your caregiver regularly.  HOME CARE INSTRUCTIONS Check your blood glucose (sugar) at least once daily. More frequent monitoring may be necessary, depending on your medications and on how well your diabetes is controlled. Your caregiver will advise you.  Take your medicine as directed by your caregiver.  Do not smoke.  Make wise food choices. Ask your caregiver for information. Weight loss can improve your diabetes.  Learn about low blood glucose (hypoglycemia) and how to treat it.  Get your eyes checked regularly.  Have a yearly physical exam. Have your blood pressure checked. Get your blood and urine tested.  Wear a pendant or bracelet saying that you have diabetes.  Check your feet every night for sores. Let your caregiver know if you have sores  that are not healing.  SEEK MEDICAL CARE IF: You are having problems keeping your blood glucose at target range.  You feel you might be having problems with your medicines.  You have symptoms of an illness that is not improving after 24 hours.  You have a sore or wound that is not healing.  You notice a change in vision or a new problem with your vision.  You develop a fever of more than 121f.  Document Released: 03/16/2005 Document Re-Released: 04/07/2009  [facility] Patient Information 2011 [place], [place].  Improved endurance.   Increased bone density.   Weight control.   Increased muscle strength.   Decreased body fat.   Improvement of the body's use of a hormone called insulin.   Increased insulin sensitivity.   Reduction of insulin needs.   Helps you feel better.   Reduces stress and tension.  People with diabetes who add exercise to their lifestyle gain additional benefits.   Weight loss.   Reduces appetite.   Improves body's use of blood glucose (sugar).   Decreases risk factors for heart disease:   Lowering of cholesterol and triglycerides.   Raising the level of good cholesterol (high-density lipoproteins [HDL]).   Lowering blood sugar.   Decreases blood pressure.  TYPE 1 DIABETES AND EXERCISE  Exercise will usually lower your blood glucose.   If blood glucose is greater than 240 mg/dl, check urine ketones. If ketones are present, do not exercise.   Location of the insulin injection sites may need to be adjusted with exercise. Avoid injecting insulin into areas of the body that  will be exercised. For example, avoid injecting insulin into:   The arms when playing tennis.   The legs when jogging. For more information, discuss this with your caregiver.   Keep a record of:   Food intake.   Type and amount of exercise.   Expected peak times of insulin action.   Blood glucose (sugar) levels.  Do this before, during and after exercise. Review your  records with your caregiver(s). This will help you to develop guidelines for adjusting food intake and/or insulin amounts.  TYPE 2 DIABETES AND EXERCISE  Regular physical activity can help control blood glucose.   Exercise is important because it may:   Increase the body's sensitivity to insulin.   Improve blood glucose control.   Exercise reduces the risk of heart disease. It decreases serum cholesterol and triglycerides. It also lowers blood pressure.   Those who take insulin or oral hypoglycemic agents should watch for signs of hypoglycemia. These signs include dizziness, shaking, sweating, chills and confusion.   Body water is lost during exercise. It must be replaced. This will help to avoid loss of body fluids (dehydration) and/or heat stroke.  Be sure to talk to your caregiver before starting an exercise program to make sure it is safe for you. Remember, any activity is better than none.  Document Released: 06/06/2003 Document Re-Released: 01/11/2009 [facility] Patient Information 2011 [place], [place].  64 oz clear fluids daily and Mucinex 600mg  twice daily x 10 days

## 2010-07-22 ENCOUNTER — Encounter: Payer: Self-pay | Admitting: Family Medicine

## 2010-07-22 NOTE — Assessment & Plan Note (Signed)
No c/o today, continue exercise as tolerated

## 2010-07-22 NOTE — Assessment & Plan Note (Signed)
Avoid trans fats, minimize simple carbs, continue fish oil and Crestor continue to monitor

## 2010-07-22 NOTE — Assessment & Plan Note (Signed)
Improved, report return of symptoms

## 2010-07-22 NOTE — Assessment & Plan Note (Signed)
Patient has been titrating his Lantus as directed and is now at 73 units in am and 24 units in pm. Minimize simple carbs and since blood sugars still running slightly hi, may continue to titrate Lantus slowly or we may increase Metfromin to max dosing, patient will call if numbers running over 150

## 2010-07-22 NOTE — Assessment & Plan Note (Signed)
Improved with repeat check, continue Losartan

## 2010-07-22 NOTE — Assessment & Plan Note (Addendum)
Encouraged increased exercise and decreased po intake, consider DASH diet

## 2010-07-22 NOTE — Assessment & Plan Note (Signed)
Encouraged daily antihistamine and nasal saline prn and report worsening symptoms

## 2010-07-22 NOTE — Assessment & Plan Note (Addendum)
With morbid obesity, patient is attempting weight loss but continues to struggle with his weight, encouraged him to consider DASH diet

## 2010-07-23 NOTE — Progress Notes (Signed)
Steven Ibarra [id number] 05-20-1955 07/23/2010      Progress Note-Follow Up  Subjective  Chief Complaint  Chief Complaint  Patient presents with  . Diabetes    FBS 150 this AM, FBS has been as low as 100  . Cough    dry    HPI  Patient is a 55 year old Caucasian male in today for followup on multiple medical problems. He's generally been feeling well since his last visit and denies any recent illness, fevers, chills, headache, chest pain palpitations, shortness of breath, GI or GU complaints. He does have some mild trouble with his allergies with some itching and congestion at times but the symptoms are tolerable and he has not taken regular medications. Zyrtec is helpful when he needs it. He has titrated his Lantus to 73 units in the morning and 24 creatinine but still has blood sugars in the 140-160 range on regular occasions. Denies polyuria or polydipsia. He is trying to watch his diet and decrease his sugar intake. Past Medical History  Diagnosis Date  . Diabetes mellitus approx 2003    type 2  . Hyperlipidemia   . OSA (obstructive sleep apnea)   . Obesity   . SLEEP APNEA, OBSTRUCTIVE 10/23/2009  . Overweight 10/23/2009  . Mixed hyperlipidemia 10/23/2009  . ELEVATED BLOOD PRESSURE 04/23/2010  . DYSPNEA ON EXERTION 10/23/2009  . DIABETES MELLITUS, TYPE II 10/23/2009  . CHEST PAIN, ATYPICAL 10/23/2009  . ALLERGIC RHINITIS, SEASONAL 10/23/2009    Past Surgical History  Procedure Date  . Tonsillectomy and adenoidectomy     Family History  Problem Relation Age of Onset  . Other Mother     CHF  . Stroke Father   . Hyperlipidemia Father   . Heart disease Father     s/p valve replacement  . Hyperlipidemia Brother   . Obesity Brother   . Other Brother     Back pain  . Hyperlipidemia Brother   . Hypertension Brother   . Other Brother     Panic attacks  . Hyperlipidemia Brother     History   Social History  . Marital Status: Married    Spouse Name: N/A    Number of  Children: N/A  . Years of Education: N/A   Occupational History  . Not on file.   Social History Main Topics  . Smoking status: Former Smoker -- 1.0 packs/day for 9 years    Types: Cigarettes    Quit date: 03/31/1975  . Smokeless tobacco: Never Used  . Alcohol Use: 0.0 oz/week    0 drink(s) per week  . Drug Use: No  . Sexually Active: Not on file   Other Topics Concern  . Not on file   Social History Narrative  . No narrative on file    Current Outpatient Prescriptions on File Prior to Visit  Medication Sig Dispense Refill  . cetirizine (ZYRTEC) 10 MG tablet Take 10 mg by mouth daily as needed. For allergies        . glucose blood test strip 1 each by Other route as needed. Check 3-4 times a day       . NON FORMULARY Accu-check multiclix lancets- use as directed       . NON FORMULARY 2 (two) times daily. BD Ultra-fine pen needles, mini 31 gauge, - use as directed to administer Lantus       . Elastic Bandages & Supports (KNEE BRACE) MISC by Does not apply route. Left knee soft brace        .  guaiFENesin (MUCINEX) 600 MG 12 hr tablet Take 1,200 mg by mouth 2 (two) times daily. X 10 days         No Known Allergies  Review of Systems  Review of Systems  Constitutional: Negative for fever and malaise/fatigue.  HENT: Negative for congestion.   Eyes: Negative for discharge.  Respiratory: Negative for shortness of breath.   Cardiovascular: Negative for chest pain, palpitations and leg swelling.  Gastrointestinal: Negative for nausea, abdominal pain and diarrhea.  Genitourinary: Negative for dysuria.  Musculoskeletal: Negative for falls.  Skin: Negative for rash.  Neurological: Negative for loss of consciousness and headaches.  Endo/Heme/Allergies: Negative for polydipsia.  Psychiatric/Behavioral: Negative for depression and suicidal ideas. The patient is not nervous/anxious and does not have insomnia.     Objective  BP 126/80  Pulse 102  Wt 276 lb (125.193  kg)  Physical Exam  Physical Exam  Constitutional: He is oriented to person, place, and time and well-developed, well-nourished, and in no distress. No distress.  HENT:  Head: Normocephalic and atraumatic.  Eyes: Conjunctivae are normal.  Neck: Neck supple. No thyromegaly present.  Cardiovascular: Normal rate, regular rhythm and normal heart sounds.   No murmur heard. Pulmonary/Chest: Effort normal and breath sounds normal. No respiratory distress.  Abdominal: He exhibits no distension and no mass. There is no tenderness.  Musculoskeletal: He exhibits no edema.  Neurological: He is alert and oriented to person, place, and time.  Skin: Skin is warm.  Psychiatric: Memory, affect and judgment normal.    Lab Results  Component Value Date   TSH 2.39 10/23/2009   Lab Results  Component Value Date   WBC 6.7 03/25/2010   HGB 15.6 03/25/2010   HCT 42.1 03/25/2010   MCV 81.7 03/25/2010   PLT 176 03/25/2010   Lab Results  Component Value Date   CREATININE 0.82 03/25/2010   BUN 18 03/25/2010   NA 131* 03/25/2010   K 4.0 03/25/2010   CL 96 03/25/2010   CO2 24 03/25/2010   Lab Results  Component Value Date   ALT 27 10/23/2009   AST 22 10/23/2009   ALKPHOS 44 10/23/2009   BILITOT 0.7 10/23/2009   Lab Results  Component Value Date   CHOL 166 10/23/2009   Lab Results  Component Value Date   HDL 34.00* 10/23/2009   No results found for this basename: LDLCALC   Lab Results  Component Value Date   TRIG 497.0* 10/23/2009   Lab Results  Component Value Date   CHOLHDL 5 10/23/2009     Assessment & Plan  SLEEP APNEA, OBSTRUCTIVE With morbid obesity, patient is attempting weight loss but continues to struggle with his weight, encouraged him to consider DASH diet  OVERWEIGHT Encouraged increased exercise and decreased po intake, consider DASH diet  MIXED HYPERLIPIDEMIA Avoid trans fats, minimize simple carbs, continue fish oil and Crestor continue to monitor  ELEVATED  BLOOD PRESSURE Improved with repeat check, continue Losartan  DYSPNEA ON EXERTION No c/o today, continue exercise as tolerated  DIABETES MELLITUS, TYPE II Patient has been titrating his Lantus as directed and is now at 73 units in am and 24 units in pm. Minimize simple carbs and since blood sugars still running slightly hi, may continue to titrate Lantus slowly or we may increase Metfromin to max dosing, patient will call if numbers running over 150  CHEST PAIN, ATYPICAL Improved, report return of symptoms  ALLERGIC RHINITIS, SEASONAL Encouraged daily antihistamine and nasal saline prn and report worsening symptoms

## 2010-10-21 ENCOUNTER — Other Ambulatory Visit: Payer: Self-pay | Admitting: Family Medicine

## 2010-12-02 ENCOUNTER — Other Ambulatory Visit: Payer: Self-pay

## 2010-12-02 DIAGNOSIS — I10 Essential (primary) hypertension: Secondary | ICD-10-CM

## 2010-12-02 MED ORDER — LOSARTAN POTASSIUM 25 MG PO TABS: 25.0000 mg | ORAL_TABLET | Freq: Every day | ORAL | Status: DC

## 2011-02-03 ENCOUNTER — Other Ambulatory Visit: Payer: Self-pay | Admitting: Family Medicine

## 2011-02-11 ENCOUNTER — Other Ambulatory Visit: Payer: Self-pay

## 2011-02-11 MED ORDER — METFORMIN HCL ER (MOD) 500 MG PO TB24: ORAL_TABLET | ORAL | Status: DC

## 2011-02-20 ENCOUNTER — Other Ambulatory Visit: Payer: Self-pay | Admitting: Family Medicine

## 2011-03-19 ENCOUNTER — Encounter: Payer: Self-pay | Admitting: Family Medicine

## 2011-03-19 ENCOUNTER — Ambulatory Visit (INDEPENDENT_AMBULATORY_CARE_PROVIDER_SITE_OTHER): Payer: BC Managed Care – PPO | Admitting: Family Medicine

## 2011-03-19 VITALS — BP 136/87 | HR 90 | Temp 98.2°F | Ht 78.0 in | Wt 293.6 lb

## 2011-03-19 DIAGNOSIS — L0291 Cutaneous abscess, unspecified: Secondary | ICD-10-CM

## 2011-03-19 DIAGNOSIS — L02221 Furuncle of abdominal wall: Secondary | ICD-10-CM

## 2011-03-19 DIAGNOSIS — L0390 Cellulitis, unspecified: Secondary | ICD-10-CM

## 2011-03-19 DIAGNOSIS — L02229 Furuncle of trunk, unspecified: Secondary | ICD-10-CM

## 2011-03-19 MED ORDER — MUPIROCIN 2 % EX OINT
TOPICAL_OINTMENT | CUTANEOUS | Status: DC
Start: 2011-03-19 — End: 2011-05-19

## 2011-03-19 MED ORDER — "INSULIN SYRINGE-NEEDLE U-100 31G X 5/16"" 0.5 ML MISC": Status: DC

## 2011-03-19 MED ORDER — MUPIROCIN 2 % EX OINT: TOPICAL_OINTMENT | CUTANEOUS | Status: DC

## 2011-03-19 MED ORDER — SULFAMETHOXAZOLE-TMP DS 800-160 MG PO TABS: 1.0000 | ORAL_TABLET | Freq: Two times a day (BID) | ORAL | Status: DC

## 2011-03-19 MED ORDER — SULFAMETHOXAZOLE-TMP DS 800-160 MG PO TABS: 1.0000 | ORAL_TABLET | Freq: Two times a day (BID) | ORAL | Status: AC

## 2011-03-19 NOTE — Progress Notes (Signed)
OFFICE NOTE  03/19/2011  CC:  Chief Complaint  Patient presents with  . boil    on stomach X 7 days- sore     HPI: Patient is a 55 y.o. Caucasian male who is here for boil on stomach. Onset about a week ago as "pimple" sized bump that began to get red and enlarge. Now about the size of a quarter, leaks a bit, a bit painful b/c his belt/pant line rub against it a lot. No fever or malaise.  Glucoses have been high lately: 200s fasting, 300s later in day, says he's slowly titrating insulin. He has done nothing to try to make it better.    Pertinent PMH:  Denies hx of boil or cellulitis in the past. DM 2 HTN Hyperlipidemia OSA  Pertinent Meds: Crestor, cozaar, losartan, fenofibrate, zyrtec, Lantus, metformin, zantac, ASA   PE: Blood pressure 136/87, pulse 90, temperature 98.2 F (36.8 C), temperature source Oral, height 6\' 6"  (1.981 m), weight 293 lb 9.6 oz (133.176 kg), SpO2 94.00%. Gen: Alert, well appearing.  Patient is oriented to person, place, time, and situation.  Pleasant affect. ABD: right lower quadrant abd wall with 2cm circular subQ abscess--very superficial-feeling, with maybe a pea sized area beneath that could represent a pus pocket.  This 2cm superficial abscess area had an area of erythematous skin surrounding it that measured 6cm top to bottom and 11 cm across.  Mild tenderness to palpation.  Pinpoint drainage hole present with old dried drainage present.  No active drainage.   IMPRESSION AND PLAN: Abdominal wall abscess with cellulitis. Will treat as if this is MRSA--Bactrim DS 1 tab bid x 10d, mupirocin 2% ointment to the opening, apply dry heat 20 min at least 3 times per day to encourage drainage through skin.  Return in 5-7 d for recheck, earlier if worsening.  FOLLOW UP: as above

## 2011-03-19 NOTE — Progress Notes (Signed)
Addended by: Court Joy on: 03/19/2011 04:56 PM   Modules accepted: Orders

## 2011-03-26 ENCOUNTER — Encounter: Payer: Self-pay | Admitting: Family Medicine

## 2011-03-26 ENCOUNTER — Telehealth: Payer: Self-pay | Admitting: Family Medicine

## 2011-03-26 ENCOUNTER — Ambulatory Visit (INDEPENDENT_AMBULATORY_CARE_PROVIDER_SITE_OTHER): Payer: BC Managed Care – PPO | Admitting: Family Medicine

## 2011-03-26 VITALS — BP 134/91 | HR 97 | Temp 97.8°F | Ht 78.0 in | Wt 292.0 lb

## 2011-03-26 DIAGNOSIS — L02221 Furuncle of abdominal wall: Secondary | ICD-10-CM | POA: Insufficient documentation

## 2011-03-26 DIAGNOSIS — K118 Other diseases of salivary glands: Secondary | ICD-10-CM

## 2011-03-26 DIAGNOSIS — L02229 Furuncle of trunk, unspecified: Secondary | ICD-10-CM

## 2011-03-26 DIAGNOSIS — J029 Acute pharyngitis, unspecified: Secondary | ICD-10-CM

## 2011-03-26 LAB — CBC WITH DIFFERENTIAL/PLATELET
Basophils Relative: 1 % (ref 0.0–3.0)
Eosinophils Absolute: 0.3 10*3/uL (ref 0.0–0.7)
Eosinophils Relative: 4.7 % (ref 0.0–5.0)
HCT: 45.1 % (ref 39.0–52.0)
Hemoglobin: 15.6 g/dL (ref 13.0–17.0)
Lymphs Abs: 1.4 10*3/uL (ref 0.7–4.0)
MCHC: 34.5 g/dL (ref 30.0–36.0)
MCV: 89.4 fl (ref 78.0–100.0)
Monocytes Absolute: 0.7 10*3/uL (ref 0.1–1.0)
Neutro Abs: 4.3 10*3/uL (ref 1.4–7.7)
Neutrophils Relative %: 63.6 % (ref 43.0–77.0)
RBC: 5.05 Mil/uL (ref 4.22–5.81)
WBC: 6.7 10*3/uL (ref 4.5–10.5)

## 2011-03-26 LAB — COMPREHENSIVE METABOLIC PANEL
ALT: 23 U/L (ref 0–53)
AST: 20 U/L (ref 0–37)
Alkaline Phosphatase: 52 U/L (ref 39–117)
BUN: 17 mg/dL (ref 6–23)
Creatinine, Ser: 1.1 mg/dL (ref 0.4–1.5)
Total Bilirubin: 0.7 mg/dL (ref 0.3–1.2)

## 2011-03-26 LAB — SEDIMENTATION RATE: Sed Rate: 8 mm/hr (ref 0–22)

## 2011-03-26 LAB — POCT RAPID STREP A (OFFICE): Rapid Strep A Screen: NEGATIVE

## 2011-03-26 NOTE — Telephone Encounter (Signed)
Please call Steven Ibarra and tell him we need him to go to the lab for some tests prior to getting his CT scan: please order CBC, CMET, and ESR (doesn't need to be fasting) to be done here OR at [facility] in [place].--PM

## 2011-03-26 NOTE — Progress Notes (Signed)
Addended by: Luisa Dago on: 03/26/2011 09:44 AM   Modules accepted: Orders

## 2011-03-26 NOTE — Telephone Encounter (Signed)
Pt notified and has already returned for labs.

## 2011-03-26 NOTE — Progress Notes (Signed)
OFFICE NOTE  03/26/2011  CC:  Chief Complaint  Patient presents with  . Follow-up    furnucle of abdominal wall     HPI: Patient is a 55 y.o. Caucasian male who is here for 7 day f/u for abd wall furuncle (bactrim and bactroban + heat), plus has a ST. Furuncle draining a bit, is smaller, less sore.  No malaise or fever.  Taking abx--applying heat at least once daily.  ST onset yesterday, slight PND/nasal congestion but not too bad.  No cough, no f/c/malaise or body aches. No rash.  Has had swelling under right jaw bone about 6 wks now, says he thinks it may have gone down some briefly but then got bigger again. Mild discomfort, mainly when he touches it.  No worsening with eating/salivating.  Nothing has bee tried to make it better.  Pertinent PMH:  Past Medical History  Diagnosis Date  . Diabetes mellitus approx 2003    type 2  . Hyperlipidemia   . OSA (obstructive sleep apnea)   . Obesity   . SLEEP APNEA, OBSTRUCTIVE 10/23/2009  . Overweight 10/23/2009  . Mixed hyperlipidemia 10/23/2009  . ELEVATED BLOOD PRESSURE 04/23/2010  . DYSPNEA ON EXERTION 10/23/2009  . DIABETES MELLITUS, TYPE II 10/23/2009  . CHEST PAIN, ATYPICAL 10/23/2009  . ALLERGIC RHINITIS, SEASONAL 10/23/2009    Pertinent Meds: Bactrim DS 1 bid day 7 of 10, bactroban ointment, fenofibrate, lantus, metformin, losartan, zantac, crestor  PE: Blood pressure 134/91, pulse 97, temperature 97.8 F (36.6 C), temperature source Oral, height 6\' 6"  (1.981 m), weight 292 lb (132.45 kg). VS: noted--normal. Gen: alert, NAD, NONTOXIC APPEARING. HEENT: eyes without injection, drainage, or swelling.  Ears: EACs clear, TMs with normal light reflex and landmarks.  Nose: Clear rhinorrhea, with some dried, crusty exudate adherent to mildly injected mucosa.  No purulent d/c.  No paranasal sinus TTP.  No facial swelling.  Throat and mouth without focal lesion.  Mild uvula erythema/swelling, otherwise no pharyngial swelling, erythema,  or exudate.   Neck: supple, no LAD.  I feel a 3 cm oval subQ mass that is firm, slightly irregular-feeling on surface, and mildly tender to palpation located in right submandibular gland region.  It is moveable.  No overlying skin changes. LUNGS: CTA bilat, nonlabored resps.   CV: RRR, no m/r/g. EXT: no c/c/e SKIN: abd wall with 1-2 cm oval area of pinkish erythema, central drainage area without active drainage.  I can't feel any subQ abscess/fluctuance.  No surrounding erythema, no tenderness.    IMPRESSION AND PLAN: 1) Abd wall furuncle--resolving.  Finish abx.  Monitor for complete resolution, return if persists or worsens. 2) Viral pharyngitis, sent throat clx.  Discussed symptomatic care. 3) Right sided neck mass: feels like very enlarged submadibular gland--present >6 wks. Will further eval with neck CT with contrast.  FOLLOW UP: 24mo

## 2011-03-30 ENCOUNTER — Encounter (HOSPITAL_COMMUNITY): Payer: Self-pay

## 2011-03-30 ENCOUNTER — Ambulatory Visit (HOSPITAL_COMMUNITY)
Admission: RE | Admit: 2011-03-30 | Discharge: 2011-03-30 | Disposition: A | Discharge status: Home / Self Care | Payer: BC Managed Care – PPO | Source: Ambulatory Visit | Attending: Family Medicine | Admitting: Family Medicine

## 2011-03-30 DIAGNOSIS — R221 Localized swelling, mass and lump, neck: Secondary | ICD-10-CM | POA: Insufficient documentation

## 2011-03-30 DIAGNOSIS — K118 Other diseases of salivary glands: Secondary | ICD-10-CM

## 2011-03-30 DIAGNOSIS — R220 Localized swelling, mass and lump, head: Secondary | ICD-10-CM | POA: Insufficient documentation

## 2011-03-30 DIAGNOSIS — R599 Enlarged lymph nodes, unspecified: Secondary | ICD-10-CM | POA: Insufficient documentation

## 2011-03-30 MED ORDER — IOHEXOL 300 MG/ML  SOLN
75.0000 mL | Freq: Once | INTRAMUSCULAR | Status: AC | PRN
  Administered 2011-03-30: 75 mL via INTRAVENOUS

## 2011-03-31 NOTE — Progress Notes (Signed)
Quick Note:  Spoke with pt on phone 03/30/11 and discussed results and plan for ENT referral ASAP for further e/m. Will have Francee Piccolo, CMA, arrange this appt and get back with pt with details.---PM ______

## 2011-04-08 ENCOUNTER — Other Ambulatory Visit (HOSPITAL_COMMUNITY)
Admission: RE | Admit: 2011-04-08 | Discharge: 2011-04-08 | Disposition: A | Discharge status: Home / Self Care | Payer: BC Managed Care – PPO | Source: Ambulatory Visit | Attending: Otolaryngology | Admitting: Otolaryngology

## 2011-04-08 ENCOUNTER — Other Ambulatory Visit: Payer: Self-pay | Admitting: Otolaryngology

## 2011-04-08 DIAGNOSIS — R220 Localized swelling, mass and lump, head: Secondary | ICD-10-CM | POA: Insufficient documentation

## 2011-04-09 ENCOUNTER — Other Ambulatory Visit: Payer: Self-pay

## 2011-04-09 MED ORDER — INSULIN GLARGINE 100 UNIT/ML ~~LOC~~ SOLN: SUBCUTANEOUS | Status: DC

## 2011-04-21 ENCOUNTER — Other Ambulatory Visit (HOSPITAL_COMMUNITY): Payer: Self-pay | Admitting: Otolaryngology

## 2011-04-21 DIAGNOSIS — R221 Localized swelling, mass and lump, neck: Secondary | ICD-10-CM

## 2011-04-22 ENCOUNTER — Other Ambulatory Visit (HOSPITAL_COMMUNITY): Payer: Self-pay | Admitting: Otolaryngology

## 2011-05-01 ENCOUNTER — Other Ambulatory Visit (HOSPITAL_COMMUNITY): Payer: BC Managed Care – PPO

## 2011-05-02 ENCOUNTER — Other Ambulatory Visit: Payer: Self-pay | Admitting: Family Medicine

## 2011-05-04 ENCOUNTER — Encounter (HOSPITAL_COMMUNITY)
Admission: RE | Admit: 2011-05-04 | Discharge: 2011-05-04 | Disposition: A | Discharge status: Home / Self Care | Payer: BC Managed Care – PPO | Source: Ambulatory Visit | Attending: Otolaryngology | Admitting: Otolaryngology

## 2011-05-04 DIAGNOSIS — N281 Cyst of kidney, acquired: Secondary | ICD-10-CM | POA: Insufficient documentation

## 2011-05-04 DIAGNOSIS — K7689 Other specified diseases of liver: Secondary | ICD-10-CM | POA: Insufficient documentation

## 2011-05-04 DIAGNOSIS — R599 Enlarged lymph nodes, unspecified: Secondary | ICD-10-CM | POA: Insufficient documentation

## 2011-05-04 DIAGNOSIS — R221 Localized swelling, mass and lump, neck: Secondary | ICD-10-CM

## 2011-05-04 DIAGNOSIS — J9819 Other pulmonary collapse: Secondary | ICD-10-CM | POA: Insufficient documentation

## 2011-05-04 DIAGNOSIS — C44300 Unspecified malignant neoplasm of skin of unspecified part of face: Secondary | ICD-10-CM | POA: Insufficient documentation

## 2011-05-04 LAB — GLUCOSE, CAPILLARY: Glucose-Capillary: 153 mg/dL — ABNORMAL HIGH (ref 70–99)

## 2011-05-04 MED ORDER — FLUDEOXYGLUCOSE F - 18 (FDG) INJECTION
17.0000 | Freq: Once | INTRAVENOUS | Status: AC | PRN
  Administered 2011-05-04: 17 via INTRAVENOUS

## 2011-05-06 ENCOUNTER — Other Ambulatory Visit: Payer: Self-pay

## 2011-05-06 MED ORDER — LOSARTAN POTASSIUM 25 MG PO TABS: 25.0000 mg | ORAL_TABLET | Freq: Every day | ORAL | Status: DC

## 2011-05-07 ENCOUNTER — Other Ambulatory Visit (HOSPITAL_COMMUNITY): Payer: Self-pay | Admitting: Otolaryngology

## 2011-05-19 ENCOUNTER — Encounter (HOSPITAL_COMMUNITY): Payer: Self-pay | Admitting: Pharmacy Technician

## 2011-05-20 ENCOUNTER — Encounter (HOSPITAL_COMMUNITY): Payer: Self-pay

## 2011-05-20 ENCOUNTER — Encounter (HOSPITAL_COMMUNITY)
Admission: RE | Admit: 2011-05-20 | Discharge: 2011-05-20 | Disposition: A | Discharge status: Home / Self Care | Payer: BC Managed Care – PPO | Source: Ambulatory Visit | Attending: Otolaryngology | Admitting: Otolaryngology

## 2011-05-20 ENCOUNTER — Encounter (HOSPITAL_COMMUNITY)
Admission: RE | Admit: 2011-05-20 | Discharge: 2011-05-20 | Disposition: A | Discharge status: Home / Self Care | Payer: BC Managed Care – PPO | Source: Ambulatory Visit | Attending: Anesthesiology | Admitting: Anesthesiology

## 2011-05-20 ENCOUNTER — Other Ambulatory Visit: Payer: Self-pay | Admitting: Family Medicine

## 2011-05-20 ENCOUNTER — Other Ambulatory Visit: Payer: Self-pay

## 2011-05-20 HISTORY — DX: Gastro-esophageal reflux disease without esophagitis: K21.9

## 2011-05-20 HISTORY — DX: Essential (primary) hypertension: I10

## 2011-05-20 HISTORY — DX: Unspecified malignant neoplasm of skin, unspecified: C44.90

## 2011-05-20 LAB — CBC
Platelets: 164 10*3/uL (ref 150–400)
RBC: 4.89 MIL/uL (ref 4.22–5.81)
WBC: 8.3 10*3/uL (ref 4.0–10.5)

## 2011-05-20 LAB — BASIC METABOLIC PANEL
Calcium: 9.3 mg/dL (ref 8.4–10.5)
GFR calc non Af Amer: >90 mL/min (ref >90)
Sodium: 137 mEq/L (ref 135–145)

## 2011-05-20 LAB — SURGICAL PCR SCREEN: Staphylococcus aureus: NEGATIVE

## 2011-05-20 NOTE — Pre-Procedure Instructions (Signed)
20 DOLORES MCGOVERN  05/20/2011   Your procedure is scheduled on:  May 28, 2011  Report to [facility] at 0530 AM.  Call this number if you have problems the morning of surgery: [phone]   Remember:   Do not eat food:After Midnight.  May have clear liquids: up to 4 Hours before arrival.  Clear liquids include soda, tea, black coffee, apple or grape juice, broth.  Take these medicines the morning of surgery with A SIP OF WATER: Zantac, Take 1/2 dose of normal insulin the night before surgery     STOP all vitamins and osteo biflex 05/21/11  Do not wear jewelry, make-up or nail polish.  Do not wear lotions, powders, or perfumes. You may wear deodorant.  Do not shave 48 hours prior to surgery.  Do not bring valuables to the hospital.  Contacts, dentures or bridgework may not be worn into surgery.  Leave suitcase in the car. After surgery it may be brought to your room.  For patients admitted to the hospital, checkout time is 11:00 AM the day of discharge.   Patients discharged the day of surgery will not be allowed to drive home.  Name and phone number of your driver: Kavir Savoca [phone]  Special Instructions: CHG Shower Use Special Wash: 1/2 bottle night before surgery and 1/2 bottle morning of surgery.   Please read over the following fact sheets that you were given: Pain Booklet, Coughing and Deep Breathing, MRSA Information and Surgical Site Infection Prevention

## 2011-05-25 ENCOUNTER — Other Ambulatory Visit: Payer: Self-pay | Admitting: Family Medicine

## 2011-05-28 ENCOUNTER — Inpatient Hospital Stay (HOSPITAL_COMMUNITY)
Admission: RE | Admit: 2011-05-28 | Discharge: 2011-05-29 | DRG: 270 | Disposition: A | Discharge status: Home / Self Care | Payer: BC Managed Care – PPO | Source: Ambulatory Visit | Attending: Otolaryngology | Admitting: Otolaryngology

## 2011-05-28 ENCOUNTER — Encounter (HOSPITAL_COMMUNITY): Payer: Self-pay | Admitting: *Deleted

## 2011-05-28 ENCOUNTER — Ambulatory Visit (HOSPITAL_COMMUNITY): Payer: BC Managed Care – PPO | Admitting: *Deleted

## 2011-05-28 ENCOUNTER — Other Ambulatory Visit: Payer: Self-pay | Admitting: Family Medicine

## 2011-05-28 ENCOUNTER — Encounter (HOSPITAL_COMMUNITY)
Admission: RE | Disposition: A | Discharge status: Home / Self Care | Payer: Self-pay | Source: Ambulatory Visit | Attending: Otolaryngology

## 2011-05-28 DIAGNOSIS — Z7982 Long term (current) use of aspirin: Secondary | ICD-10-CM

## 2011-05-28 DIAGNOSIS — R220 Localized swelling, mass and lump, head: Principal | ICD-10-CM | POA: Diagnosis present

## 2011-05-28 DIAGNOSIS — C4441 Basal cell carcinoma of skin of scalp and neck: Secondary | ICD-10-CM

## 2011-05-28 DIAGNOSIS — I10 Essential (primary) hypertension: Secondary | ICD-10-CM | POA: Diagnosis present

## 2011-05-28 DIAGNOSIS — R221 Localized swelling, mass and lump, neck: Secondary | ICD-10-CM

## 2011-05-28 DIAGNOSIS — Z794 Long term (current) use of insulin: Secondary | ICD-10-CM

## 2011-05-28 DIAGNOSIS — Z87891 Personal history of nicotine dependence: Secondary | ICD-10-CM

## 2011-05-28 HISTORY — DX: Basal cell carcinoma of skin of scalp and neck: C44.41

## 2011-05-28 HISTORY — PX: RADICAL NECK DISSECTION: SHX2284

## 2011-05-28 LAB — GLUCOSE, CAPILLARY
Glucose-Capillary: 193 mg/dL — ABNORMAL HIGH (ref 70–99)
Glucose-Capillary: 182 mg/dL — ABNORMAL HIGH (ref 70–99)
Glucose-Capillary: 263 mg/dL — ABNORMAL HIGH (ref 70–99)
Glucose-Capillary: 282 mg/dL — ABNORMAL HIGH (ref 70–99)

## 2011-05-28 SURGERY — DISSECTION, NECK, RADICAL
Anesthesia: General | Site: Neck | Wound class: Clean

## 2011-05-28 MED ORDER — METFORMIN HCL 500 MG PO TABS
1000.0000 mg | ORAL_TABLET | Freq: Two times a day (BID) | ORAL | Status: DC
  Administered 2011-05-28 – 2011-05-29 (×2): 1000 mg via ORAL
  Filled 2011-05-28 (×6): qty 2

## 2011-05-28 MED ORDER — INSULIN ASPART 100 UNIT/ML ~~LOC~~ SOLN
SUBCUTANEOUS | Status: DC | PRN
  Administered 2011-05-28: 5 [IU] via SUBCUTANEOUS

## 2011-05-28 MED ORDER — INSULIN GLARGINE 100 UNIT/ML ~~LOC~~ SOLN
50.0000 [IU] | Freq: Every day | SUBCUTANEOUS | Status: DC
  Administered 2011-05-28: 50 [IU] via SUBCUTANEOUS
  Filled 2011-05-28: qty 3

## 2011-05-28 MED ORDER — 0.9 % SODIUM CHLORIDE (POUR BTL) OPTIME
TOPICAL | Status: DC | PRN
  Administered 2011-05-28: 1000 mL

## 2011-05-28 MED ORDER — ONDANSETRON HCL 4 MG/2ML IJ SOLN
4.0000 mg | Freq: Four times a day (QID) | INTRAMUSCULAR | Status: DC | PRN
  Administered 2011-05-28 (×3): 4 mg via INTRAVENOUS
  Filled 2011-05-28 (×3): qty 2

## 2011-05-28 MED ORDER — LACTATED RINGERS IV SOLN
INTRAVENOUS | Status: DC | PRN
  Administered 2011-05-28 (×2): via INTRAVENOUS

## 2011-05-28 MED ORDER — LOSARTAN POTASSIUM 25 MG PO TABS
25.0000 mg | ORAL_TABLET | Freq: Every day | ORAL | Status: DC
  Filled 2011-05-28 (×3): qty 1

## 2011-05-28 MED ORDER — METFORMIN HCL ER 500 MG PO TB24: 500.0000 mg | ORAL_TABLET | Freq: Three times a day (TID) | ORAL | Status: DC

## 2011-05-28 MED ORDER — PHENYLEPHRINE HCL 10 MG/ML IJ SOLN
INTRAMUSCULAR | Status: DC | PRN
  Administered 2011-05-28: 40 ug via INTRAVENOUS
  Administered 2011-05-28: 80 ug via INTRAVENOUS

## 2011-05-28 MED ORDER — FAMOTIDINE 10 MG PO TABS
10.0000 mg | ORAL_TABLET | Freq: Every day | ORAL | Status: DC
  Filled 2011-05-28 (×2): qty 1

## 2011-05-28 MED ORDER — HYDROMORPHONE HCL PF 1 MG/ML IJ SOLN
0.2500 mg | INTRAMUSCULAR | Status: DC | PRN
  Administered 2011-05-28 (×4): 0.5 mg via INTRAVENOUS

## 2011-05-28 MED ORDER — METFORMIN HCL 500 MG PO TABS
500.0000 mg | ORAL_TABLET | Freq: Every day | ORAL | Status: DC
  Filled 2011-05-28 (×2): qty 1

## 2011-05-28 MED ORDER — INSULIN GLARGINE 100 UNIT/ML ~~LOC~~ SOLN: 50.0000 [IU] | Freq: Two times a day (BID) | SUBCUTANEOUS | Status: DC

## 2011-05-28 MED ORDER — INSULIN GLARGINE 100 UNIT/ML ~~LOC~~ SOLN
100.0000 [IU] | Freq: Every day | SUBCUTANEOUS | Status: DC
  Filled 2011-05-28: qty 3

## 2011-05-28 MED ORDER — SUFENTANIL CITRATE 50 MCG/ML IV SOLN
INTRAVENOUS | Status: DC | PRN
  Administered 2011-05-28 (×5): 10 ug via INTRAVENOUS

## 2011-05-28 MED ORDER — BACITRACIN ZINC 500 UNIT/GM EX OINT
TOPICAL_OINTMENT | CUTANEOUS | Status: DC | PRN
  Administered 2011-05-28: 1 via TOPICAL

## 2011-05-28 MED ORDER — INSULIN GLARGINE 100 UNIT/ML ~~LOC~~ SOLN
100.0000 [IU] | Freq: Every day | SUBCUTANEOUS | Status: DC
  Administered 2011-05-29: 100 [IU] via SUBCUTANEOUS
  Filled 2011-05-28: qty 3

## 2011-05-28 MED ORDER — INSULIN ASPART 100 UNIT/ML ~~LOC~~ SOLN
0.0000 [IU] | Freq: Three times a day (TID) | SUBCUTANEOUS | Status: DC
  Administered 2011-05-28 – 2011-05-29 (×2): 8 [IU] via SUBCUTANEOUS
  Filled 2011-05-28: qty 3

## 2011-05-28 MED ORDER — PROPOFOL 10 MG/ML IV BOLUS
INTRAVENOUS | Status: DC | PRN
  Administered 2011-05-28: 40 mg via INTRAVENOUS
  Administered 2011-05-28: 200 mg via INTRAVENOUS

## 2011-05-28 MED ORDER — DEXTROSE-NACL 5-0.45 % IV SOLN
INTRAVENOUS | Status: DC
  Administered 2011-05-28: 125 mL/h via INTRAVENOUS
  Administered 2011-05-28 – 2011-05-29 (×2): via INTRAVENOUS

## 2011-05-28 MED ORDER — HYDROCODONE-ACETAMINOPHEN 5-325 MG PO TABS
1.0000 | ORAL_TABLET | ORAL | Status: DC | PRN
  Administered 2011-05-29: 2 via ORAL
  Administered 2011-05-29: 1 via ORAL
  Filled 2011-05-28 (×2): qty 2
  Filled 2011-05-28: qty 1

## 2011-05-28 MED ORDER — WHITE PETROLATUM GEL
Status: AC
  Filled 2011-05-28: qty 5

## 2011-05-28 MED ORDER — MORPHINE SULFATE 2 MG/ML IJ SOLN
2.0000 mg | INTRAMUSCULAR | Status: DC | PRN
  Administered 2011-05-28 (×3): 2 mg via INTRAVENOUS
  Filled 2011-05-28 (×3): qty 1

## 2011-05-28 MED ORDER — ONDANSETRON HCL 4 MG/2ML IJ SOLN: 4.0000 mg | Freq: Once | INTRAMUSCULAR | Status: DC | PRN

## 2011-05-28 MED ORDER — MIDAZOLAM HCL 5 MG/5ML IJ SOLN
INTRAMUSCULAR | Status: DC | PRN
  Administered 2011-05-28: 2 mg via INTRAVENOUS

## 2011-05-28 MED ORDER — FAMOTIDINE 20 MG PO TABS
20.0000 mg | ORAL_TABLET | Freq: Every day | ORAL | Status: DC
  Filled 2011-05-28 (×2): qty 1

## 2011-05-28 MED ORDER — ACETAMINOPHEN 650 MG RE SUPP: 650.0000 mg | Freq: Four times a day (QID) | RECTAL | Status: DC | PRN

## 2011-05-28 MED ORDER — ACETAMINOPHEN 325 MG PO TABS: 650.0000 mg | ORAL_TABLET | Freq: Four times a day (QID) | ORAL | Status: DC | PRN

## 2011-05-28 MED ORDER — MUPIROCIN 2 % EX OINT
1.0000 "application " | TOPICAL_OINTMENT | Freq: Two times a day (BID) | CUTANEOUS | Status: DC
  Filled 2011-05-28: qty 22

## 2011-05-28 MED ORDER — SUCCINYLCHOLINE CHLORIDE 20 MG/ML IJ SOLN
INTRAMUSCULAR | Status: DC | PRN
  Administered 2011-05-28: 100 mg via INTRAVENOUS

## 2011-05-28 SURGICAL SUPPLY — 57 items
ATTRACTOMAT 16X20 MAGNETIC DRP (DRAPES) ×1 IMPLANT
BLADE SURG 15 STRL LF DISP TIS (BLADE) IMPLANT
BLADE SURG 15 STRL SS (BLADE)
CANISTER SUCTION 2500CC (MISCELLANEOUS) ×2 IMPLANT
CLEANER TIP ELECTROSURG 2X2 (MISCELLANEOUS) ×2 IMPLANT
CLOTH BEACON ORANGE TIMEOUT ST (SAFETY) ×2 IMPLANT
CORDS BIPOLAR (ELECTRODE) ×1 IMPLANT
COVER SURGICAL LIGHT HANDLE (MISCELLANEOUS) ×2 IMPLANT
CRADLE DONUT ADULT HEAD (MISCELLANEOUS) ×1 IMPLANT
DRAIN CHANNEL 15F RND FF W/TCR (WOUND CARE) IMPLANT
DRAIN HEMOVAC 1/8 X 5 (WOUND CARE) IMPLANT
DRAIN JACKSON RD 7FR 3/32 (WOUND CARE) ×1 IMPLANT
ELECT COATED BLADE 2.86 ST (ELECTRODE) ×2 IMPLANT
ELECT REM PT RETURN 9FT ADLT (ELECTROSURGICAL) ×2
ELECTRODE REM PT RTRN 9FT ADLT (ELECTROSURGICAL) ×1 IMPLANT
EVACUATOR SILICONE 100CC (DRAIN) ×2 IMPLANT
GAUZE SPONGE 4X4 16PLY XRAY LF (GAUZE/BANDAGES/DRESSINGS) ×2 IMPLANT
GLOVE BIO SURGEON STRL SZ7.5 (GLOVE) ×1 IMPLANT
GLOVE BIOGEL PI IND STRL 6.5 (GLOVE) IMPLANT
GLOVE BIOGEL PI IND STRL 7.5 (GLOVE) IMPLANT
GLOVE BIOGEL PI INDICATOR 6.5 (GLOVE) ×2
GLOVE BIOGEL PI INDICATOR 7.5 (GLOVE) ×1
GLOVE ECLIPSE 6.5 STRL STRAW (GLOVE) ×1 IMPLANT
GLOVE ECLIPSE 7.5 STRL STRAW (GLOVE) ×1 IMPLANT
GLOVE SS BIOGEL STRL SZ 7.5 (GLOVE) ×2 IMPLANT
GLOVE SUPERSENSE BIOGEL SZ 7.5 (GLOVE) ×1
GLOVE SURG SS PI 7.0 STRL IVOR (GLOVE) ×2 IMPLANT
GOWN STRL NON-REIN LRG LVL3 (GOWN DISPOSABLE) ×7 IMPLANT
KIT BASIN OR (CUSTOM PROCEDURE TRAY) ×2 IMPLANT
KIT ROOM TURNOVER OR (KITS) ×2 IMPLANT
LOCATOR NERVE 3 VOLT (DISPOSABLE) IMPLANT
MARKER SKIN DUAL TIP RULER LAB (MISCELLANEOUS) ×1 IMPLANT
NS IRRIG 1000ML POUR BTL (IV SOLUTION) ×2 IMPLANT
PAD ARMBOARD 7.5X6 YLW CONV (MISCELLANEOUS) ×3 IMPLANT
PENCIL FOOT CONTROL (ELECTRODE) ×2 IMPLANT
PROBE NERVBE PRASS .33 (MISCELLANEOUS) ×1 IMPLANT
SPECIMEN JAR MEDIUM (MISCELLANEOUS) ×1 IMPLANT
SPONGE INTESTINAL PEANUT (DISPOSABLE) IMPLANT
SPONGE LAP 18X18 X RAY DECT (DISPOSABLE) ×2 IMPLANT
STAPLER VISISTAT 35W (STAPLE) ×2 IMPLANT
SUT CHROMIC 3 0 SH 27 (SUTURE) ×6 IMPLANT
SUT CHROMIC 5 0 P 3 (SUTURE) IMPLANT
SUT ETHILON 3 0 PS 1 (SUTURE) ×1 IMPLANT
SUT ETHILON 5 0 PS 2 18 (SUTURE) IMPLANT
SUT SILK 2 0 (SUTURE) ×4
SUT SILK 2 0 SH CR/8 (SUTURE) ×2 IMPLANT
SUT SILK 2-0 18XBRD TIE 12 (SUTURE) ×1 IMPLANT
SUT SILK 4 0 (SUTURE) ×8
SUT SILK 4-0 18XBRD TIE 12 (SUTURE) ×4 IMPLANT
SUT VIC AB 3-0 FS2 27 (SUTURE) IMPLANT
SUT VIC AB 3-0 SH 18 (SUTURE) IMPLANT
TOWEL OR 17X24 6PK STRL BLUE (TOWEL DISPOSABLE) ×2 IMPLANT
TOWEL OR 17X26 10 PK STRL BLUE (TOWEL DISPOSABLE) ×2 IMPLANT
TRAY ENT MC OR (CUSTOM PROCEDURE TRAY) ×2 IMPLANT
TRAY FOLEY CATH 14FRSI W/METER (CATHETERS) IMPLANT
TUBE FEEDING 10FR FLEXIFLO (MISCELLANEOUS) IMPLANT
WATER STERILE IRR 1000ML POUR (IV SOLUTION) ×1 IMPLANT

## 2011-05-28 NOTE — Op Note (Signed)
Preoperative diagnosis/postoperative diagnosis: Right neck mass Procedure right suprahyoid neck dissection Anesthesia: Gen. Estimated blood loss approximately 25 cc Indications this is a 56 year old a mass in his right submandibular region. It was worked up with a CT scan and fine-needle aspiration. 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 He was informed risks and benefits of the procedure and options were discussed all questions are answered and consent was obtained. Operation: Patient taken to the operating room placed in the supine position after general endotracheal tube anesthesia the facial nerve monitor probes were placed into the lower lip and calibrated with good impedance. The patient was prepped and draped in the usual sterile manner. A apron type incision was outlined and opened after prep and drape in the usual sterile manner. The incision was made with  electrocautery. Dissection was carried through the platysma a subplatysmal flap was elevated superiorly. The platysma muscle looked normal but the tumor was right at the edge of the fascia at its epicenter. The hemostat was used to carefully attempt to dissect and find the marginal mandibular nerve. The nerve appeared to fall right into this scar tumor band that was very firm and coming up from the deep structures. It was felt that this was not safe to preserved given the possibility of malignant tumor. The dissection was then proceeded taking the vessels right at the mandibular rim  and dissecting down along the superior aspect. There was a good margin of the tumor in this location . The submandibular gland was brought down inferiorly and the dissection was carried over toward the digastric and mylohyoid where the vessels and were divided. The dissection was carried along the anterior belly of digastric down inferior where the submandibular gland was obviously ptotic outside the lower rim of the  digastric plane. The mylohyoid was retracted and the lingual nerve was identified and the ganglion was clamped. The dissection was then brought inferiorly as the tumor and submandibular gland were brought out of submandibular triangle. The hypoglossal nerve was seen and left intact in the floor of the submandibular triangle. The dissection was carried down along the posterior belly of digastric where the vessel was clamped and divided and then dissection was continued along the belly of the digastric to remove the mass. The wound was irrigated with saline and then he #7 JP drain placed. The wound was closed with interrupted 4-0 chromic. The drain was secured with 3-0 nylon. The skin was closed with skin staples. Patient was awake and brought to cover stable condition counts correct.

## 2011-05-28 NOTE — Anesthesia Preprocedure Evaluation (Addendum)
Anesthesia Evaluation  Patient identified by MRN, date of birth, ID band Patient awake    Reviewed: Allergy & Precautions, H&P , NPO status , Patient's Chart, lab work & pertinent test results, reviewed documented beta blocker date and time   Airway Mallampati: III TM Distance: >3 FB Neck ROM: Full    Dental  (+) Teeth Intact, Caps and Dental Advisory Given   Pulmonary sleep apnea and Continuous Positive Airway Pressure Ventilation ,  clear to auscultation        Cardiovascular hypertension, Pt. on medications Regular Normal    Neuro/Psych    GI/Hepatic   Endo/Other  Diabetes mellitus-, Oral Hypoglycemic Agents  Renal/GU      Musculoskeletal   Abdominal   Peds  Hematology   Anesthesia Other Findings   Reproductive/Obstetrics                         Anesthesia Physical Anesthesia Plan  ASA: III  Anesthesia Plan: General   Post-op Pain Management:    Induction: Intravenous  Airway Management Planned: Oral ETT  Additional Equipment:   Intra-op Plan:   Post-operative Plan: Extubation in OR  Informed Consent: I have reviewed the patients History and Physical, chart, labs and discussed the procedure including the risks, benefits and alternatives for the proposed anesthesia with the patient or authorized representative who has indicated his/her understanding and acceptance.   Dental advisory given  Plan Discussed with: CRNA, Anesthesiologist and Surgeon  Anesthesia Plan Comments: (R. Salivary gland tumor Obesity, sleep apnea on CPAP Type 2 DM glucose 193 Htn  Plan GA with ETT  Kipp Brood, MD)       Anesthesia Quick Evaluation

## 2011-05-28 NOTE — H&P (Signed)
Steven Ibarra is an 56 y.o. male.   Chief Complaint: 2HPI: Right neck mass Patient is here for treatment of the right neck mass that that he has had for a number of months.  Past Medical History  Diagnosis Date  . Hyperlipidemia   . Obesity   . Overweight 10/23/2009  . Mixed hyperlipidemia 10/23/2009  . ELEVATED BLOOD PRESSURE 04/23/2010  . DYSPNEA ON EXERTION 10/23/2009  . CHEST PAIN, ATYPICAL 10/23/2009  . ALLERGIC RHINITIS, SEASONAL 10/23/2009  . OSA (obstructive sleep apnea)   . SLEEP APNEA, OBSTRUCTIVE 10/23/2009    Sleep study Done at [facility]  . Diabetes mellitus approx 2003    type 2  . DIABETES MELLITUS, TYPE II 10/23/2009  . Hypertension     Does not see a cardiologist, has not had a stress, echo   . Skin cancer     on nose  . GERD (gastroesophageal reflux disease)     Past Surgical History  Procedure Date  . Tonsillectomy and adenoidectomy   . Skin cancer removal     Family History  Problem Relation Age of Onset  . Other Mother     CHF  . Stroke Father   . Hyperlipidemia Father   . Heart disease Father     s/p valve replacement  . Hyperlipidemia Brother   . Obesity Brother   . Other Brother     Back pain  . Hyperlipidemia Brother   . Hypertension Brother   . Other Brother     Panic attacks  . Hyperlipidemia Brother   . Anesthesia problems Neg Hx    Social History:  reports that he quit smoking about 36 years ago. His smoking use included Cigarettes. He has a 9 pack-year smoking history. He has never used smokeless tobacco. He reports that he drinks alcohol. He reports that he does not use illicit drugs.  Allergies: No Known Allergies  No current facility-administered medications on file as of 05/28/2011.   Medications Prior to Admission  Medication Sig Dispense Refill  . Ascorbic Acid (VITAMIN C) 1000 MG tablet Take 1,000 mg by mouth daily.      . B-D UF III MINI PEN NEEDLES 31G X 5 MM MISC 1 each by Other route 2 (two) times daily. Use twice daily  as directed with Lantus      . ibuprofen (ADVIL,MOTRIN) 200 MG tablet Take 600 mg by mouth every 6 (six) hours as needed. For pain      . insulin glargine (LANTUS) 100 UNIT/ML injection Inject 50-100 Units into the skin 2 (two) times daily. 100 units in am and 50 units in pm      . losartan (COZAAR) 25 MG tablet Take 25 mg by mouth daily.      . metFORMIN (GLUMETZA) 500 MG (MOD) 24 hr tablet Take 500-1,000 mg by mouth 3 (three) times daily. Take 2 tablets in morning, 1 tablet mid-day, and 2 tablets in evening      . Misc Natural Products (OSTEO BI-FLEX TRIPLE STRENGTH PO) Take 2 tablets by mouth daily.      . Multiple Vitamin (MULITIVITAMIN WITH MINERALS) TABS Take 1 tablet by mouth daily.      . NON FORMULARY 1 each by Other route 4 (four) times daily. Accu-check multiclix lancets- use as directed      . ranitidine (ZANTAC) 300 MG tablet Take 300 mg by mouth at bedtime.      . rosuvastatin (CRESTOR) 20 MG tablet Take 20 mg by mouth  at bedtime.      Marland Kitchen aspirin 81 MG tablet Take 81 mg by mouth at bedtime.        . cetirizine (ZYRTEC) 10 MG tablet Take 10 mg by mouth daily as needed. For allergies       . mupirocin ointment (BACTROBAN) 2 % Apply 1 application topically 2 (two) times daily. Apply to affected area tid x 10d        Results for orders placed during the hospital encounter of 05/28/11 (from the past 48 hour(s))  GLUCOSE, CAPILLARY     Status: Abnormal   Collection Time   05/28/11  6:25 AM      Component Value Range Comment   Glucose-Capillary 193 (*) 70 - 99 (mg/dL)    No results found.  Review of Systems  Constitutional: Negative.   HENT: Negative.   Eyes: Negative.   Skin: Negative.     Blood pressure 146/87, pulse 90, temperature 97.9 F (36.6 C), temperature source Oral, resp. rate 20, SpO2 97.00%. Physical Exam  Constitutional: He appears well-developed and well-nourished.  HENT:  Head: Normocephalic and atraumatic.  Nose: Nose normal.  Mouth/Throat: Oropharynx is  clear and moist.  Eyes: Pupils are equal, round, and reactive to light.  Neck: Normal range of motion. Neck supple.  Cardiovascular: Normal rate.   Respiratory: Effort normal.     Assessment/Plan Right neck mass-he is here for right neck dissection for a mass she's had for a number of months with full workup suspicious for possible tumor. The procedure has been discussed and he is ready for to proceed.  Suzanna Obey 05/28/2011, 7:28 AM

## 2011-05-28 NOTE — Transfer of Care (Signed)
Immediate Anesthesia Transfer of Care Note  Patient: Steven Ibarra  Procedure(s) Performed: Procedure(s) (LRB): RADICAL NECK DISSECTION (N/A)  Patient Location: PACU  Anesthesia Type: General  Level of Consciousness: awake  Airway & Oxygen Therapy: Patient Spontanous Breathing and Patient connected to face mask oxygen  Post-op Assessment: Report given to PACU RN and Post -op Vital signs reviewed and stable  Post vital signs: Reviewed and stable  Complications: No apparent anesthesia complications

## 2011-05-28 NOTE — Progress Notes (Signed)
Day of Surgery  Subjective: Having some nausea and vomiting since surgery.  Pain controlled.  Some numbness of right side of tongue that seems to be improving.  Objective: Vital signs in last 24 hours: Temp:  [97 F (36.1 C)-98 F (36.7 C)] 97.4 F (36.3 C) (02/28 1736) Pulse Rate:  [82-102] 82  (02/28 1736) Resp:  [12-23] 18  (02/28 1736) BP: (141-176)/(76-105) 147/86 mmHg (02/28 1736) SpO2:  [91 %-98 %] 92 % (02/28 1736) Weight:  [132.36 kg (291 lb 12.8 oz)] 132.36 kg (291 lb 12.8 oz) (02/28 1300) Last BM Date: 05/28/11  Intake/Output from previous day:   Intake/Output this shift: Total I/O In: 2065 [P.O.:120; I.V.:1945] Out: 105 [Drains:30; Blood:75]  General appearance: alert, cooperative and no distress Neck: Right neck incision clean and intact.  No fluid collection.  Drain functioning.  CNs VII, XI, and XII normal and symmetric except for slight right lower lip weakness.  Lab Results:  No results found for this basename: WBC:2,HGB:2,HCT:2,PLT:2 in the last 72 hours BMET No results found for this basename: NA:2,K:2,CL:2,CO2:2,GLUCOSE:2,BUN:2,CREATININE:2,CALCIUM:2 in the last 72 hours PT/INR No results found for this basename: LABPROT:2,INR:2 in the last 72 hours ABG No results found for this basename: PHART:2,PCO2:2,PO2:2,HCO3:2 in the last 72 hours  Studies/Results: No results found.  Anti-infectives: Anti-infectives    None      Assessment/Plan: s/p Procedure(s) (LRB): RADICAL NECK DISSECTION (N/A) Doing well.  Continue drain.  LOS: 0 days    Jiovany Scheffel 05/28/2011

## 2011-05-28 NOTE — Anesthesia Postprocedure Evaluation (Signed)
  Anesthesia Post-op Note  Patient: Steven Ibarra  Procedure(s) Performed: Procedure(s) (LRB): RADICAL NECK DISSECTION (N/A)  Patient Location: PACU  Anesthesia Type: General  Level of Consciousness: awake, alert  and oriented  Airway and Oxygen Therapy: Patient Spontanous Breathing and Patient connected to nasal cannula oxygen  Post-op Pain: mild  Post-op Assessment: Post-op Vital signs reviewed and Patient's Cardiovascular Status Stable  Post-op Vital Signs: stable  Complications: No apparent anesthesia complications

## 2011-05-28 NOTE — Preoperative (Signed)
Beta Blockers   Reason not to administer Beta Blockers:Not Applicable 

## 2011-05-29 MED ORDER — HYDROCODONE-ACETAMINOPHEN 7.5-500 MG PO TABS: 1.0000 | ORAL_TABLET | Freq: Four times a day (QID) | ORAL | Status: AC | PRN

## 2011-05-29 MED FILL — Hydromorphone HCl Inj 1 MG/ML: INTRAMUSCULAR | Qty: 1 | Status: AC

## 2011-05-29 MED FILL — Ondansetron HCl Inj 4 MG/2ML (2 MG/ML): INTRAMUSCULAR | Qty: 2 | Status: AC

## 2011-05-29 MED FILL — Insulin Regular (Human) Inj 100 Unit/ML: INTRAMUSCULAR | Qty: 1 | Status: AC

## 2011-05-29 NOTE — Progress Notes (Signed)
Discharged home. Home discharged instruction given to patient, no question verbalized. Incision, clean and dry, staples intact, no drainage. Patient denies pain, not in any distress.

## 2011-05-29 NOTE — Progress Notes (Signed)
He is doing well. No problems or complaints except for a slight numbness of his tip of the right thumb.  The wound looks excellent. The drain was removed as there was minimal amount of JP drainage. The tongue has good movement and testing with the forceps the tongue seems to have sensation in all locations. The right lower lip seems to function well. He has a good postoperative course and will discharged. To followup in one week.

## 2011-05-29 NOTE — Discharge Summary (Signed)
Physician Discharge Summary  Patient ID: Steven Ibarra MRN: [id number] DOB/AGE: 55/04/1955 55 y.o.  Admit date: 05/28/2011 Discharge date: 05/29/2011  Admission Diagnoses: Right neck mass  Discharge Diagnoses:  Active Problems:  * No active hospital problems. *    Discharged Condition: good  Hospital Course: He is admitted after having right suprahyoid neck dissection. He did well postoperatively. His wound looked excellent and he had no significant drainage. The drain was removed. He had a slight numbness to the tip of his tongue but otherwise his nerves seem to be intact. He was discharged to followup in one week for suture removal. Wound instructions were given.  Consults: None  Significant Diagnostic Studies: none  Treatments: surgery: Right suprahyoid neck dissection  Discharge Exam: Blood pressure 144/82, pulse 90, temperature 97.2 F (36.2 C), temperature source Oral, resp. rate 19, height 6\' 6"  (1.981 m), weight 132.36 kg (291 lb 12.8 oz), SpO2 91.00%. Awake and alert, nose is clear, oral cavity/oropharynx-no lesions or swelling. Tongue does not seem to have any deficit tested by hemostat. Neck-the wound looks excellent with no evidence of infection or hematoma. Staples are intact. Drain was removed.  Disposition: Final discharge disposition not confirmed  Discharge Orders    Future Orders Please Complete By Expires   Diet - low sodium heart healthy      Increase activity slowly      Discharge instructions      Comments:   He will followup in one week. He's to call if he has any issues or questions or problems. Wound instructions were discussed.   Call MD for:  temperature >100.4      Call MD for:  persistant nausea and vomiting      Call MD for:  severe uncontrolled pain      Call MD for:  redness, tenderness, or signs of infection (pain, swelling, redness, odor or green/yellow discharge around incision site)      Call MD for:  difficulty breathing, headache or  visual disturbances      Call MD for:  hives      Call MD for:  persistant dizziness or light-headedness      Call MD for:  extreme fatigue        Medication List  As of 05/29/2011  8:44 AM   STOP taking these medications         mupirocin ointment 2 %         TAKE these medications         ACCU-CHEK AVIVA PLUS test strip   Generic drug: glucose blood   TEST 3-4 TIMES DAILY      aspirin 81 MG tablet   Take 81 mg by mouth at bedtime.      B-D UF III MINI PEN NEEDLES 31G X 5 MM Misc   Generic drug: Insulin Pen Needle   1 each by Other route 2 (two) times daily. Use twice daily as directed with Lantus      cetirizine 10 MG tablet   Commonly known as: ZYRTEC   Take 10 mg by mouth daily as needed. For allergies        fenofibrate 160 MG tablet   TAKE 1 TABLET BY MOUTH EVERY DAY      HYDROcodone-acetaminophen 7.5-500 MG per tablet   Commonly known as: LORTAB   Take 1 tablet by mouth every 6 (six) hours as needed for pain.      ibuprofen 200 MG tablet   Commonly known as: ADVIL,MOTRIN  Take 600 mg by mouth every 6 (six) hours as needed. For pain      insulin glargine 100 UNIT/ML injection   Commonly known as: LANTUS   Inject 50-100 Units into the skin 2 (two) times daily. 100 units in am and 50 units in pm      losartan 25 MG tablet   Commonly known as: COZAAR   Take 25 mg by mouth daily.      metFORMIN 500 MG tablet   Commonly known as: GLUCOPHAGE   Take 500-1,000 mg by mouth 3 (three) times daily with meals. Takes 2 tablets (1000 mg) with breakfast and dinner and takes 1 tablet (500 mg) with lunch      mulitivitamin with minerals Tabs   Take 1 tablet by mouth daily.      NON FORMULARY   1 each by Other route 4 (four) times daily. Accu-check multiclix lancets- use as directed      OSTEO BI-FLEX TRIPLE STRENGTH PO   Take 2 tablets by mouth daily.      ranitidine 300 MG tablet   Commonly known as: ZANTAC   Take 300 mg by mouth at bedtime.      ranitidine 300  MG tablet   Commonly known as: ZANTAC   TAKE 1 TABLET AT BEDTIME      rosuvastatin 20 MG tablet   Commonly known as: CRESTOR   Take 20 mg by mouth at bedtime.      vitamin C 1000 MG tablet   Take 1,000 mg by mouth daily.             SignedSuzanna Obey 05/29/2011, 8:44 AM

## 2011-05-29 NOTE — Progress Notes (Signed)
Patient wearing home CPAP unit via [facility] full face mask. Pt. Tolerating well at this time.

## 2011-05-30 ENCOUNTER — Encounter (HOSPITAL_COMMUNITY): Payer: Self-pay | Admitting: Otolaryngology

## 2011-06-11 ENCOUNTER — Encounter: Payer: Self-pay | Admitting: *Deleted

## 2011-06-11 DIAGNOSIS — K219 Gastro-esophageal reflux disease without esophagitis: Secondary | ICD-10-CM | POA: Insufficient documentation

## 2011-06-11 DIAGNOSIS — I10 Essential (primary) hypertension: Secondary | ICD-10-CM | POA: Insufficient documentation

## 2011-06-11 DIAGNOSIS — E1169 Type 2 diabetes mellitus with other specified complication: Secondary | ICD-10-CM | POA: Insufficient documentation

## 2011-06-11 DIAGNOSIS — E669 Obesity, unspecified: Secondary | ICD-10-CM | POA: Insufficient documentation

## 2011-06-12 ENCOUNTER — Encounter: Payer: Self-pay | Admitting: Radiation Oncology

## 2011-06-12 ENCOUNTER — Telehealth: Payer: Self-pay | Admitting: *Deleted

## 2011-06-12 ENCOUNTER — Ambulatory Visit
Admission: RE | Admit: 2011-06-12 | Discharge: 2011-06-12 | Disposition: A | Discharge status: Home / Self Care | Payer: BC Managed Care – PPO | Source: Ambulatory Visit | Attending: Radiation Oncology | Admitting: Radiation Oncology

## 2011-06-12 VITALS — BP 152/88 | HR 102 | Temp 97.7°F | Resp 20 | Ht 78.0 in | Wt 294.0 lb

## 2011-06-12 DIAGNOSIS — Z87891 Personal history of nicotine dependence: Secondary | ICD-10-CM | POA: Insufficient documentation

## 2011-06-12 DIAGNOSIS — C4491 Basal cell carcinoma of skin, unspecified: Secondary | ICD-10-CM

## 2011-06-12 DIAGNOSIS — R110 Nausea: Secondary | ICD-10-CM | POA: Insufficient documentation

## 2011-06-12 DIAGNOSIS — E669 Obesity, unspecified: Secondary | ICD-10-CM | POA: Insufficient documentation

## 2011-06-12 DIAGNOSIS — I10 Essential (primary) hypertension: Secondary | ICD-10-CM | POA: Insufficient documentation

## 2011-06-12 DIAGNOSIS — J029 Acute pharyngitis, unspecified: Secondary | ICD-10-CM | POA: Insufficient documentation

## 2011-06-12 DIAGNOSIS — K121 Other forms of stomatitis: Secondary | ICD-10-CM | POA: Insufficient documentation

## 2011-06-12 DIAGNOSIS — L988 Other specified disorders of the skin and subcutaneous tissue: Secondary | ICD-10-CM | POA: Insufficient documentation

## 2011-06-12 DIAGNOSIS — G4733 Obstructive sleep apnea (adult) (pediatric): Secondary | ICD-10-CM | POA: Insufficient documentation

## 2011-06-12 DIAGNOSIS — Z510 Encounter for antineoplastic radiation therapy: Secondary | ICD-10-CM | POA: Insufficient documentation

## 2011-06-12 DIAGNOSIS — E782 Mixed hyperlipidemia: Secondary | ICD-10-CM | POA: Insufficient documentation

## 2011-06-12 DIAGNOSIS — C770 Secondary and unspecified malignant neoplasm of lymph nodes of head, face and neck: Secondary | ICD-10-CM | POA: Insufficient documentation

## 2011-06-12 DIAGNOSIS — Z79899 Other long term (current) drug therapy: Secondary | ICD-10-CM | POA: Insufficient documentation

## 2011-06-12 DIAGNOSIS — C4441 Basal cell carcinoma of skin of scalp and neck: Secondary | ICD-10-CM

## 2011-06-12 DIAGNOSIS — E119 Type 2 diabetes mellitus without complications: Secondary | ICD-10-CM | POA: Insufficient documentation

## 2011-06-12 DIAGNOSIS — Z85828 Personal history of other malignant neoplasm of skin: Secondary | ICD-10-CM | POA: Insufficient documentation

## 2011-06-12 NOTE — Progress Notes (Signed)
Takes Hydrocodone qhs to help him rest; otherwise pt's R neck, surgical site is "tender". Appetite good, no diff swallowing.  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

## 2011-06-12 NOTE — Telephone Encounter (Signed)
XXXX 

## 2011-06-12 NOTE — Progress Notes (Signed)
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 male who reports that he had a basal cell carcinoma excised from the right nasal crease and [place] about 9-10 years ago. He reports that this was excised and reexcised to clear the margins. He had an unremarkable course until about 6 months ago he noted a right submandibular lump. This waxed and waned over the next few months but then it became progressively harder and [place]. This prompted him to see his primary doctor who referred him to otolaryngology. Fine-needle aspiration of the submandibular mass showed basaloid neoplasm. The results were nonspecific but suspicious for basal cell carcinoma or pleomorphic adenoma versus adenoid cystic carcinoma. CT scan of the patient's neck performed on 03-30-11 revealed 3 enhancing masses in the right submandibular region. The largest mass was 2.0 cm in dimension.  PET scan was performed on 05/04/2011 and this revealed moderate hypermetabolic activity in the right submandibular lymph nodes.  Dr. Jearld Fenton performed a right suprahyoid neck dissection on 05/28/2011. I reviewed his operative note and have also spoken with Dr. Jearld Fenton. Dr. Jearld Fenton assessment there was one firm mass consistent with a lymph node. The lymph node mass appeared to be directly invading the submandibular gland. It is unclear whether or one lymph node was involved with carcinoma or 2-3 lymph nodes were involved based on the pathology report. The pathology demonstrated basal cell carcinoma with focal squamous differentiation.  Based on the pathology report there was no clear perineural invasion. Based  on Dr. Jearld Fenton impression there was no major nerve involved, but he cannot rule out that minor nerves were involved.  Based on the pathology report and Dr. Jearld Fenton' impression there is concern for positive margins, particularly at the platysma muscle.  The patient is recovering from surgery. He reports that he does have some numbness at the right lateral tongue and in the submandibular region. His right lower lip is still recovering motor function.    PAST MEDICAL HISTORY:  has a past medical history of Hyperlipidemia; Obesity; Overweight (10/23/2009); Mixed hyperlipidemia (10/23/2009); ELEVATED BLOOD PRESSURE (04/23/2010); DYSPNEA ON EXERTION (10/23/2009); CHEST PAIN, ATYPICAL (10/23/2009); ALLERGIC RHINITIS, SEASONAL (10/23/2009); OSA (obstructive sleep apnea); SLEEP APNEA, OBSTRUCTIVE (10/23/2009); Cancer; Hearing loss; Hypertension; GERD (gastroesophageal reflux disease); Diabetes mellitus (approx 2003); DIABETES MELLITUS, TYPE II (10/23/2009); Skin cancer; and Basal cell cancer (05/28/11).     PAST SURGICAL HISTORY:  Past Surgical History  Procedure Date  . Tonsillectomy and adenoidectomy   . Skin cancer removal   . Radical neck dissection 05/28/2011  . Tonsillectomy   . Radical neck dissection 05/28/2011    Procedure: RADICAL NECK DISSECTION;  Surgeon: Suzanna Obey, MD;  Location: [facility] OR;  Service: ENT;  Laterality: N/A;  Suprahyoid Neck Dissection     FAMILY HISTORY: family history includes Heart disease in his father; Hyperlipidemia in his brothers and father; Hypertension in his brother; Obesity in his brother; Other in his brothers and mother; and Stroke in his father.  There is no history of Anesthesia problems.   SOCIAL HISTORY:  reports that he quit smoking about 36 years ago. His smoking use included Cigarettes. He has a 9 pack-year  smoking history. He has never used smokeless tobacco. He reports that he drinks alcohol. He reports that he does not use illicit drugs.   ALLERGIES: Review of  patient's allergies indicates no known allergies.   MEDICATIONS:  Current Outpatient Prescriptions  Medication Sig Dispense Refill  . ACCU-CHEK AVIVA PLUS test strip TEST 3-4 TIMES DAILY  100 strip  1  . Ascorbic Acid (VITAMIN C) 1000 MG tablet Take 1,000 mg by mouth daily.      Marland Kitchen aspirin 81 MG tablet Take 81 mg by mouth at bedtime.        . B-D UF III MINI PEN NEEDLES 31G X 5 MM MISC 1 each by Other route 2 (two) times daily. Use twice daily as directed with Lantus      . cetirizine (ZYRTEC) 10 MG tablet Take 10 mg by mouth daily as needed. For allergies       . fenofibrate 160 MG tablet TAKE 1 TABLET BY MOUTH EVERY DAY  90 tablet  2  . HYDROcodone-acetaminophen (NORCO) 7.5-325 MG per tablet Take 1 tablet by mouth every 6 (six) hours as needed.      Marland Kitchen ibuprofen (ADVIL,MOTRIN) 200 MG tablet Take 600 mg by mouth every 6 (six) hours as needed. For pain      . insulin glargine (LANTUS) 100 UNIT/ML injection Inject 50-100 Units into the skin 2 (two) times daily. 100 units in am and 50 units in pm      . losartan (COZAAR) 25 MG tablet Take 25 mg by mouth daily.      . metFORMIN (GLUCOPHAGE) 500 MG tablet Take 500-1,000 mg by mouth 3 (three) times daily with meals. Takes 2 tablets (1000 mg) with breakfast and dinner and takes 1 tablet (500 mg) with lunch      . Misc Natural Products (OSTEO BI-FLEX TRIPLE STRENGTH PO) Take 2 tablets by mouth daily.      . Multiple Vitamin (MULITIVITAMIN WITH MINERALS) TABS Take 1 tablet by mouth daily.      . NON FORMULARY 1 each by Other route 4 (four) times daily. Accu-check multiclix lancets- use as directed      . ranitidine (ZANTAC) 300 MG tablet Take 300 mg by mouth at bedtime.      . ranitidine (ZANTAC) 300 MG tablet TAKE 1 TABLET AT BEDTIME  30 tablet  2  . rosuvastatin (CRESTOR) 20 MG tablet Take 20 mg by mouth at bedtime.         REVIEW OF SYSTEMS:  Notable for that described above. He denies any weight loss. His energy is good.    PHYSICAL EXAM:   height is 6\' 6"  (1.981 m) and weight is 294 lb (133.358 kg). His oral temperature is 97.7 F (36.5 C). His blood pressure is 152/88 and his pulse is 102. His respiration is 20.   General: Alert and oriented, in no acute distress HEENT: Head is normocephalic. Pupils are equally round and reactive to light. Extraocular movements are intact. Oropharynx is clear. Dentition in decent repair. Mucous membranes are moist.  His tongue is midline. The right lower lip appears to have incomplete motor function. Subjectively he reports decreased sensation over the right lateral tongue and in the submandibular region on the right Neck: Neck demonstrates no palpable cervical or supraclavicular lymphadenopathy. The scar appears to be here healing well in the right submandibular region. Heart: Regular in rate and rhythm with no murmurs, rubs, or gallops. Chest: Clear to auscultation bilaterally, with no rhonchi, wheezes, or  rales. Abdomen: Soft, nontender, nondistended, with no rigidity or guarding. Extremities: No cyanosis or edema. Lymphatics: No concerning lymphadenopathy. Skin: No concerning lesions. Musculoskeletal: symmetric strength and muscle tone throughout. Neurologic: Cranial nerves II through XII are grossly intact with exception to that described above in HEENT. No obvious focalities. Speech is fluent. Coordination is intact. Psychiatric: Judgment and insight are intact. Affect is appropriate.   LABORATORY DATA:  Lab Results  Component Value Date   WBC 8.3 05/20/2011   HGB 14.7 05/20/2011   HCT 41.3 05/20/2011   MCV 84.5 05/20/2011   PLT 164 05/20/2011   CMP     Component Value Date/Time   NA 137 05/20/2011 0845   K 3.8 05/20/2011 0845   CL 103 05/20/2011 0845   CO2 25 05/20/2011 0845   GLUCOSE 181* 05/20/2011 0845   BUN 21 05/20/2011 0845   CREATININE 0.95 05/20/2011 0845   CALCIUM 9.3 05/20/2011 0845   PROT 6.8 03/26/2011 0945   ALBUMIN 4.3 03/26/2011 0945   AST 20 03/26/2011 0945   ALT 23  03/26/2011 0945   ALKPHOS 52 03/26/2011 0945   BILITOT 0.7 03/26/2011 0945   GFRNONAA >90 05/20/2011 0845   GFRAA >90 05/20/2011 0845      PATHOLOGY: As above   RADIOLOGY: As above  IMPRESSION/PLAN: This is a very pleasant 56 year old gentleman with a history of basal cell carcinoma of the right lateral nose crease excised about a decade ago. He had an unremarkable course until he developed right submandibular swelling which corresponded with lymphadenopathy. There is concern postoperatively for positive margins; this appeared to be a fairly aggressive mass which invaded the submandibular gland. Although there was no obvious invasion of PNI, the pathology report cannot rule out perineural invasion with certainty: therefore I am going to order an MRI of the patient's base of skull through the upper neck to rule out clinical signs of major nerve invasion. If this were the case, it would change my radiation fields and I would need to track the major nerve to the base of skull.  Tentatively I plan to treat the patient's right neck to approximately 66 Gray in 33 fractions with IMRT in order to spare his total structures adequately including his oral cavity his mandible his esophagus his brachial plexus and spinal cord and his brainstem.  I will refer him to dentistry for a pre-radiotherapy evaluation. Once he is cleared by our dentist I will start his treatment planning.  I will also send the patient to our nutritionist to help him maintain good nutritional status during treatment.  It was a pleasure meeting the patient today. We discussed the risks, benefits, and side effects of radiotherapy. These include but are not necessarily limited to mucosal soreness in the mouth and pharynx, changes in salivary function, changes in taste, increased risk of dental problems, and hypothyroidism.  Rare injury to the mandible and spinal cord were discussed.  No guarantees of treatment were given. A consent form was  signed and placed in the patient's medical record. The patient is enthusiastic about proceeding with treatment. I look forward to participating in the patient's care.  I spent 60 minutes minutes face to face with the patient and more than 50% of that time was spent in counseling and/or coordination of care.

## 2011-06-12 NOTE — Progress Notes (Signed)
Please see the Nurse Progress Note in the MD Initial Consult Encounter for this patient. 

## 2011-06-16 NOTE — Progress Notes (Signed)
Encounter addended by: Delynn Flavin, RN on: 06/16/2011  5:49 PM<BR>     Documentation filed: Charges VN

## 2011-06-17 ENCOUNTER — Ambulatory Visit (HOSPITAL_COMMUNITY)
Admission: RE | Admit: 2011-06-17 | Discharge: 2011-06-17 | Disposition: A | Discharge status: Home / Self Care | Payer: BC Managed Care – PPO | Source: Ambulatory Visit | Attending: Radiation Oncology | Admitting: Radiation Oncology

## 2011-06-17 ENCOUNTER — Other Ambulatory Visit (HOSPITAL_COMMUNITY): Payer: BC Managed Care – PPO

## 2011-06-17 DIAGNOSIS — C4491 Basal cell carcinoma of skin, unspecified: Secondary | ICD-10-CM

## 2011-06-17 DIAGNOSIS — C4441 Basal cell carcinoma of skin of scalp and neck: Secondary | ICD-10-CM | POA: Insufficient documentation

## 2011-06-17 MED ORDER — GADOBENATE DIMEGLUMINE 529 MG/ML IV SOLN
20.0000 mL | Freq: Once | INTRAVENOUS | Status: AC | PRN
  Administered 2011-06-17: 20 mL via INTRAVENOUS

## 2011-06-18 ENCOUNTER — Ambulatory Visit (HOSPITAL_COMMUNITY): Payer: Self-pay | Admitting: Dentistry

## 2011-06-18 ENCOUNTER — Encounter (HOSPITAL_COMMUNITY): Payer: Self-pay | Admitting: Dentistry

## 2011-06-18 DIAGNOSIS — C50919 Malignant neoplasm of unspecified site of unspecified female breast: Secondary | ICD-10-CM

## 2011-06-18 DIAGNOSIS — C4441 Basal cell carcinoma of skin of scalp and neck: Secondary | ICD-10-CM

## 2011-06-18 DIAGNOSIS — K036 Deposits [accretions] on teeth: Secondary | ICD-10-CM

## 2011-06-18 DIAGNOSIS — M264 Malocclusion, unspecified: Secondary | ICD-10-CM

## 2011-06-18 DIAGNOSIS — C779 Secondary and unspecified malignant neoplasm of lymph node, unspecified: Secondary | ICD-10-CM

## 2011-06-18 NOTE — Patient Instructions (Signed)

## 2011-06-18 NOTE — Progress Notes (Signed)
DENTAL CONSULTATION  Date of Consultation:  06/18/2011 Patient Name:   Steven Ibarra Date of Birth:   June 30, 1955 Medical Record Number: [id number]  VITALS: BP 152/88  Pulse 90  Temp 97.2 F (36.2 C)   REASON FOR CONSULT: Patient needs a preradiation therapy dental evaluation.  HPI: Steven Ibarra is a 56 year old male referred by Dr. Basilio Cairo for dental consultation. 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 Patient is status post neck dissection at 05/28/2011 with Dr. Jearld Fenton. Basal cell carcinoma was noted with focal squamous differentiation. Patient now with anticipated radiation therapy. Patient is now seen as part of a preradiation therapy dental protocol evaluation.   Patient currently denies acute toothache symptoms, swellings, or intraoral abscesses. Patient was last seen in December of 2012 for an exam and cleaning with doctors Blake Divine and Irene Limbo. Patient is usually seen 3 times a year. Patient denies having any active dental needs at this time. Patient has a history of previous orthotic therapy which is complete at the age of 81.    Patient Active Problem List  Diagnoses  . DIABETES MELLITUS, TYPE II  . MIXED HYPERLIPIDEMIA  . OVERWEIGHT  . SLEEP APNEA, OBSTRUCTIVE  . ALLERGIC RHINITIS, SEASONAL  . DYSPNEA ON EXERTION  . CHEST PAIN, ATYPICAL  . ELEVATED BLOOD PRESSURE  . Furuncle of abdominal wall  . Basal cell cancer  . Hypertension  . GERD (gastroesophageal reflux disease)  . Diabetes mellitus  . Basal cell carcinoma of neck    PMH: Past Medical History  Diagnosis Date  . Hyperlipidemia   . Obesity   . Overweight 10/23/2009  . Mixed hyperlipidemia 10/23/2009  . ELEVATED BLOOD PRESSURE 04/23/2010  . DYSPNEA ON EXERTION 10/23/2009  . CHEST PAIN, ATYPICAL 10/23/2009  . ALLERGIC RHINITIS, SEASONAL 10/23/2009  . OSA (obstructive sleep apnea)   . SLEEP APNEA, OBSTRUCTIVE 10/23/2009    Sleep study Done at  [facility]  . Cancer   . Hearing loss   . Hypertension     Does not see a cardiologist, has not had a stress, echo   . GERD (gastroesophageal reflux disease)   . Diabetes mellitus approx 2003    type 2  . DIABETES MELLITUS, TYPE II 10/23/2009  . Skin cancer     on nose, 9-10 yrs ago  . Basal cell cancer 05/28/11    r suprahyoid, radical neck dissection    PSH: Past Surgical History  Procedure Date  . Tonsillectomy and adenoidectomy   . Skin cancer removal   . Radical neck dissection 05/28/2011  . Tonsillectomy   . Radical neck dissection 05/28/2011    Procedure: RADICAL NECK DISSECTION;  Surgeon: Suzanna Obey, MD;  Location: [facility] OR;  Service: ENT;  Laterality: N/A;  Suprahyoid Neck Dissection    ALLERGIES: No Known Allergies  MEDICATIONS: Current Outpatient Prescriptions  Medication Sig Dispense Refill  . ACCU-CHEK AVIVA PLUS test strip TEST 3-4 TIMES DAILY  100 strip  1  . Ascorbic Acid (VITAMIN C) 1000 MG tablet Take 1,000 mg by mouth daily.      Marland Kitchen aspirin 81 MG tablet Take 81 mg by mouth at bedtime.        . B-D UF III MINI PEN NEEDLES 31G X 5 MM MISC 1 each by Other route 2 (two) times daily. Use twice daily as directed with Lantus      . cetirizine (ZYRTEC) 10 MG tablet Take 10 mg by mouth daily as needed. For allergies       .  fenofibrate 160 MG tablet TAKE 1 TABLET BY MOUTH EVERY DAY  90 tablet  2  . HYDROcodone-acetaminophen (NORCO) 7.5-325 MG per tablet Take 1 tablet by mouth every 6 (six) hours as needed.      Marland Kitchen ibuprofen (ADVIL,MOTRIN) 200 MG tablet Take 600 mg by mouth every 6 (six) hours as needed. For pain      . insulin glargine (LANTUS) 100 UNIT/ML injection Inject 50-100 Units into the skin 2 (two) times daily. 100 units in am and 50 units in pm      . losartan (COZAAR) 25 MG tablet Take 25 mg by mouth daily.      . metFORMIN (GLUCOPHAGE) 500 MG tablet Take 500-1,000 mg by mouth 3 (three) times daily with meals. Takes 2 tablets (1000 mg) with breakfast and dinner  and takes 1 tablet (500 mg) with lunch      . Misc Natural Products (OSTEO BI-FLEX TRIPLE STRENGTH PO) Take 2 tablets by mouth daily.      . Multiple Vitamin (MULITIVITAMIN WITH MINERALS) TABS Take 1 tablet by mouth daily.      . NON FORMULARY 1 each by Other route 4 (four) times daily. Accu-check multiclix lancets- use as directed      . ranitidine (ZANTAC) 300 MG tablet Take 300 mg by mouth at bedtime.      . ranitidine (ZANTAC) 300 MG tablet TAKE 1 TABLET AT BEDTIME  30 tablet  2  . rosuvastatin (CRESTOR) 20 MG tablet Take 20 mg by mouth at bedtime.       No current facility-administered medications for this visit.   Facility-Administered Medications Ordered in Other Visits  Medication Dose Route Frequency Provider Last Rate Last Dose  . gadobenate dimeglumine (MULTIHANCE) injection 20 mL  20 mL Intravenous Once PRN Medication Radiologist, MD   20 mL at 06/17/11 0943    LABS: Lab Results  Component Value Date   WBC 8.3 05/20/2011   HGB 14.7 05/20/2011   HCT 41.3 05/20/2011   MCV 84.5 05/20/2011   PLT 164 05/20/2011      Component Value Date/Time   NA 137 05/20/2011 0845   K 3.8 05/20/2011 0845   CL 103 05/20/2011 0845   CO2 25 05/20/2011 0845   GLUCOSE 181* 05/20/2011 0845   BUN 21 05/20/2011 0845   CREATININE 0.95 05/20/2011 0845   CALCIUM 9.3 05/20/2011 0845   GFRNONAA >90 05/20/2011 0845   GFRAA >90 05/20/2011 0845   No results found for this basename: INR, PROTIME   No results found for this basename: PTT    SOCIAL HISTORY: History   Social History  . Marital Status: Married    Spouse Name: N/A    Number of Children: N/A  . Years of Education: N/A   Occupational History  . Not on file.   Social History Main Topics  . Smoking status: Former Smoker -- 1.0 packs/day for 9 years    Types: Cigarettes    Quit date: 03/31/1975  . Smokeless tobacco: Never Used  . Alcohol Use: 0.0 oz/week    0 drink(s) per week     4 beers a month, 06/12/11 rarely uses now  . Drug Use: No    . Sexually Active: Yes   Other Topics Concern  . Not on file   Social History Narrative   Patient is married.Patient with a history of smoking one pack per day for approximately 29 years from ages of [age]. Patient denies ever having used smokeless tobacco. Patient with rare  use of alcohol.Mother died at age 37 from congestive heart failure complications. Father died at the age of 65 secondary to a stroke.    FAMILY HISTORY: Family History  Problem Relation Age of Onset  . Other Mother     CHF  . Stroke Father   . Hyperlipidemia Father   . Heart disease Father     s/p valve replacement  . Hyperlipidemia Brother   . Obesity Brother   . Other Brother     Back pain  . Hyperlipidemia Brother   . Hypertension Brother   . Other Brother     Panic attacks  . Hyperlipidemia Brother   . Anesthesia problems Neg Hx      REVIEW OF SYSTEMS: Reviewed with patient and positive as above.  DENTAL HISTORY: CC: Patient needs a preradiation therapy dental evaluation.  HPI: Steven Ibarra is a 56 year old male referred by Dr. Basilio Cairo for dental consultation. 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 Patient is status post neck dissection at 05/28/2011 with Dr. Jearld Fenton. Basal cell carcinoma was noted with focal squamous differentiation. Patient now with anticipated radiation therapy. Patient is now seen as part of a preradiation therapy dental protocol evaluation.   Patient currently denies acute toothache symptoms, swellings, or intraoral abscesses. Patient was last seen in December of 2012 for an exam and cleaning with doctors Blake Divine and Irene Limbo. Patient is usually seen 3 times a year. Patient denies having any active dental needs at this time. Patient has a history of previous orthotic therapy which is complete at the age of 25.   DENTAL EXAMINATION:  GENERAL: Patient is a well-developed, well-nourished male in no acute  distress. HEAD AND NECK: The right neck is consistent with previous neck dissection. There is no left neck lymphadenopathy. The patient denies acute TMJ symptoms. Patient does have evidence of some facial nerve deficits secondary to the neck dissection surgery. Patient indicates that the muscle function is improving however. INTRAORAL EXAM: Patient has normal saliva. Patient has a deep palatal vault. Patient has bilateral small mandibular tori. DENTITION: Patient is missing tooth numbers 1, 4, 13, 16, 17, 20, 24, 29, and 32. Most basis 7 closed secondary to orthodontic therapy completed at the age of 20. There is a bridge replacing tooth numbers 24. PERIODONTAL: Patient with chronic periodontitis with minimal plaque accumulations. No obvious tooth mobility is noted at this time. DENTAL CARIES/SUBOPTIMAL RESTORATIONS: Dental caries are noted to be affecting tooth #15 on the distal tip of the occlusal surface, and tooth #21 on the distal. ENDODONTIC: Patient currently denies acute pulpitis symptoms. There is no evidence of periapical pathology. Patient has had previous root canal therapies associated with tooth numbers 3, 9, 19, and 30. Patient has had previous apical surgery associated with the apex of tooth #9. CROWN AND BRIDGE: Patient has multiple crown, and chronic bridge restorations that appear to be acceptable at this time. PROSTHODONTIC: Patient has no need for partial dentures at this time. OCCLUSION: She with a poor occlusal scheme secondary to a deep overbite and malposition of tooth #2. The occlusion is stable at this time however.  RADIOGRAPHIC INTERPRETATION: A panoramic x-ray was taken and supplemented with a full series of dental radiographs.  There are multiple missing teeth. There is incipient to moderate bone loss. There are multiple previous root canal therapies noted. There is no evidence of current periapical pathology. Patient has had a previous apical surgery associated with the  apex of tooth #  9. There are dental caries noted to be affecting tooth #21.   ASSESSMENTS: 1. Chronic periodontitis with bone loss 2. Selective areas of gingival recession 3. Accretions-minimal 4. No significant tooth mobility 5. Multiple missing teeth with the spaces closed by either crown and bridge therapy or orthodontic therapy. 6. Bilateral, small mandibular tori 7. Dental caries associated with tooth numbers 15 and 21. 8. Deep palatal vault 9. Poor occlusal scheme but a stable occlusion 10. Anticipated radiation therapy to the maxillary and mandibular right teeth at a dose above 60 Gy with future risk for osteoradionecrosis.    PLAN/RECOMMENDATIONS: 1. I discussed the risks, benefits, and complications of various treatment options with the patient in relationship to the medical and dental conditions. We discussed various treatment options to include no treatment, multiple extractions of teeth and the primary field radiation therapy, alveoloplasty, pre-prosthetic surgery as indicated, periodontal therapy, dental restorations, root canal therapy, crown and bridge therapy, implant therapy, and replacement of missing teeth as indicated. The patient currently wishes to not have dental extractions at this time. Patient will followup with his primary dentist for periodontal therapy and evaluation for restoration of tooth #15 and 21 at this time. Patient did agree to impression for the fabrication of fluoride trays and scatter protection devices at this time. 2. Provision of written and verbal information on" radiation therapy and your mouth"-today. 3. Provision of a prescription for fluoride therapy. 4. Discussion of findings Dr. Basilio Cairo, and Drs. Bentsen and Irene Limbo and coordination of future medical and dental care.   Charlynne Pander, DDS

## 2011-06-22 ENCOUNTER — Ambulatory Visit (HOSPITAL_COMMUNITY): Payer: Self-pay | Admitting: Dentistry

## 2011-06-22 VITALS — BP 160/98 | HR 93 | Temp 97.2°F

## 2011-06-22 DIAGNOSIS — K029 Dental caries, unspecified: Secondary | ICD-10-CM

## 2011-06-22 DIAGNOSIS — Z463 Encounter for fitting and adjustment of dental prosthetic device: Secondary | ICD-10-CM

## 2011-06-22 DIAGNOSIS — Z0189 Encounter for other specified special examinations: Secondary | ICD-10-CM

## 2011-06-22 NOTE — Progress Notes (Signed)
"----------  Monday, June 22, 2011 at 8:43:30 AM----------" BP: 160/98          P:  93        T: 97.2  Steven Ibarra presents for insertion of upper and lower fluoride trays and Scatter protection devices. Patient has appointment with General Dentists for cleaning and restorations. I provided "paper" copy of radiographs to patient to give to Drs. Bentsen and Irene Limbo.(Primary DDS) Procedure: Appliances tried in and were adjusted prn. [place]. Trismus device fabricated as well with maximum interincisal opening of 33 mm and use of 20 sticks for trismus device. Postop instructions were provided in a written and verbal format on the use and care of appliances. All questions answered. RTC for periodic oral exam during radiation therapy in three weeks. Patient to call for appointment once treatment time is determined. Call if questions or problems before then. I called simulation and scheduled patient for 06/24/11 at 8 AM. Dr. Basilio Cairo was made aware of this appointment. Dr. Cindra Eves

## 2011-06-22 NOTE — Patient Instructions (Signed)
FLUORIDE TRAYS PATIENT INSTRUCTIONS    Obtain prescription from the pharmacy.  Don't be surprised if it needs to be ordered.   Be sure to let the pharmacy know when you are close to needing a new refill for them to have it ready for you without interruption of Fluoride use.   The best time to use your Fluoride is before bed time.   You must brush your teeth very well and floss before using the Fluoride in order to get the best use out of the Fluoride treatments.   Place 1 drop of Fluoride gel per tooth in the tray.   Place the tray on your lower teeth and/or your upper teeth.  Make sure the trays are seated all the way.  Remember, they only fit one way on your teeth.   Insert for 5 full minutes.   At the end of the 5 minutes, take the trays out.  SPIT OUT excess. .    Do NOT rinse your mouth!    Do NOT eat or drink after treatments for at least 30 minutes.  This is why the best time for your treatments is before bedtime.    Clean the inside of your Fluoride trays using COLD WATER and a toothbrush.    In order to keep your Trays from discoloring and free from odors, soak them overnight in denture cleaners such as Efferdent.  Do not use bleach or non denture products.    Store the trays in a safe dry place AWAY from any heat until your next treatment.    Bring the trays with you for your next dental check-up.  The dentist will confirm their fit.    If anything happens to your Fluoride trays, or they don't fit as well after any dental work, please let us know as soon as possible.  TRISMUS  Trismus is a condition where the jaw does not allow the mouth to open as wide as it usually does.  This can happen almost suddenly, or in other cases the process is so slow, it is hard to notice it-until it is too far along.  When the jaw joints and/or muscles have been exposed to radiation treatments, the onset of Trismus is very slow.  This is because the muscles are losing  their stretching ability over a long period of time, as long as 2 YEARS after the end of radiation.  It is therefore important to exercise these muscles and joints.  TRISMUS EXERCISES   Stack of tongue depressors measuring the same or a little less than the last documented MIO (Maximum Interincisal Opening).  Secure them with a rubber band on both ends.  Place the stack in the patient's mouth, supporting the other end.  Allow 30 seconds for muscle stretching.  Rest for a few seconds.  Repeat 3-5 times  For all radiation patients, this exercise is recommended in the mornings and evenings unless otherwise instructed.  The exercise should be done for a period of 2 YEARS after the end of radiation.  MIO should be checked routinely on recall dental visits by the general dentist or the hospital dentist.  The patient is advised to report any changes, soreness, or difficulties encountered when doing the exercises. 

## 2011-06-24 ENCOUNTER — Ambulatory Visit
Admission: RE | Admit: 2011-06-24 | Discharge: 2011-06-24 | Disposition: A | Discharge status: Home / Self Care | Payer: BC Managed Care – PPO | Source: Ambulatory Visit | Attending: Radiation Oncology | Admitting: Radiation Oncology

## 2011-06-24 ENCOUNTER — Encounter: Payer: Self-pay | Admitting: Nutrition

## 2011-06-24 DIAGNOSIS — C4441 Basal cell carcinoma of skin of scalp and neck: Secondary | ICD-10-CM

## 2011-06-24 NOTE — Progress Notes (Signed)
Simulation treatment planning note /IMRT treatment planning  The patient was taken to the CT simulator and laid in the supine position on the table. An Aquaplast head and shoulder mass was custom fitted to his anatomy. High-resolution CT axial imaging was obtained of his head and neck. I verified that the images were good for treatment planning.  Treatment planning note I plan to treat the patient with helical Tomotherapy for IMRT. The patient has a history of basal cell carcinoma that is metastatic to the right neck. 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 Therefore I will be using IMRT to cover this nerve to prevent a recurrence at the base of skull. I will also treat the patient's right neck from levels 1 through 4, his parotid gland on the right, and some of his his right facial lymph nodes as he has a prior history basal cell carcinoma of the right lateral nose crease. I plan to deliver 60-66 gray at 2 gray per fraction to his high risk tissues and approximately 54 gray at 2 gray per fraction to his lower risk tissues.  IMRT treatment planning:  IMRT is an important modality to deliver high-dose radiotherapy to his at risk tissues while sparing his normal tissues including his brainstem his spinal cord his oral cavity and his mandible.

## 2011-06-24 NOTE — Progress Notes (Signed)
Met with patient to discuss RO billing.  Patient had no concerns today. 

## 2011-06-26 ENCOUNTER — Ambulatory Visit: Payer: BC Managed Care – PPO | Admitting: Nutrition

## 2011-06-26 ENCOUNTER — Other Ambulatory Visit: Payer: Self-pay | Admitting: Radiation Oncology

## 2011-06-26 NOTE — Assessment & Plan Note (Signed)
REASON FOR ASSESSMENT:  Steven Ibarra is a 56 year old male patient of Dr. Karoline Caldwell, diagnosed with basal cell carcinoma metastatic right submandibular node.  MEDICAL HISTORY INCLUDES:  Status post neck dissection, diabetes, hyperlipidemia, obesity, sleep apnea, hypertension, GERD and tobacco.  MEDICATIONS INCLUDE:  Vitamin C, Lantus, Glucophage, multivitamin, Zantac, and Crestor.  LABS:  Glucose of 274.  HEIGHT:  78 inches. WEIGHT:  294 pounds; weight on April 20, 276 pounds. BMI:  33.98  PATIENT STATES:  He is interested in good nutrition during radiation therapy to promote healing.  He reports radiation begins on April 8th for approximately 6-7 weeks.  He is not having any chemotherapy.  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 He reports that he has actually increased his weight.  He was trying to eat more to promote healing from his recent surgery.  NUTRITION DIAGNOSIS:  Food and nutrition related knowledge deficit related to diagnosis of basal cell cancer as evidenced by no prior need for nutrition related information.  INTERVENTION:  I have educated the patient on a healthy plant based diet that is high in protein to promote maintenance of lean body mass. I discouraged drastic reduction in weight, however, I have educated him on strategies to improve glycemic control as well as incorporate protein in his meals and snacks throughout the day. I briefly touched on strategies for potential side effects he may experience and I have provided him with fact sheets and coupons.  MONITORING/EVALUATION (GOALS):  The patient will tolerate oral diet with adequate calories and protein to promote healing and maintenance of lean body mass.  NEXT VISIT:  Friday, April 19th.    ______________________________ Zenovia Jarred, RD, LDN Clinical Nutrition Specialist BN/MEDQ  D:  06/26/2011  T:   06/26/2011  Job:  [id number]

## 2011-06-29 NOTE — Progress Notes (Signed)
06/29/11 9:55am - spoke to [place] @ [employer] [place] - pre-cert req'd for IMRT and dx given.  Faxed clinicals with form to [phone].    Patient's new policy became effective 06/29/11 and carries $1300 deductible with 20% coins, and $4500 OOP max. (policy #[id number])  See website [email or web address] for provider forms.  Treatment approved (ref #[id number]) 07/03/11-01/02/12

## 2011-07-02 ENCOUNTER — Telehealth: Payer: Self-pay | Admitting: Radiation Oncology

## 2011-07-02 NOTE — Telephone Encounter (Signed)
1130 Patient phoned this writer requesting that Dr. Basilio Cairo call in a prescription for Ativan to his pharmacy. Ativan is listed as one of the patient's routine medications but, he reports that "he is out." Patient states,"I want to take something to help me relax before treatment." Patient reports that he has breathing difficulties. Instructed patient to arrive 15-20 minutes prior to his treatment time for evaluation of needs by Dr. Basilio Cairo. Patient verbalized understanding.

## 2011-07-06 ENCOUNTER — Ambulatory Visit
Admission: RE | Admit: 2011-07-06 | Discharge: 2011-07-06 | Disposition: A | Discharge status: Home / Self Care | Payer: BC Managed Care – PPO | Source: Ambulatory Visit | Attending: Radiation Oncology | Admitting: Radiation Oncology

## 2011-07-06 ENCOUNTER — Encounter: Payer: Self-pay | Admitting: Radiation Oncology

## 2011-07-06 ENCOUNTER — Other Ambulatory Visit: Payer: Self-pay | Admitting: Radiation Oncology

## 2011-07-06 VITALS — BP 143/100 | HR 92 | Resp 18 | Wt 297.3 lb

## 2011-07-06 DIAGNOSIS — C4441 Basal cell carcinoma of skin of scalp and neck: Secondary | ICD-10-CM

## 2011-07-06 NOTE — Progress Notes (Signed)
Patient presents to the clinic today accompanied by his wife for an under treat visit with Dr. Basilio Cairo. Patient is alert and oriented to person, place, and time. No distress noted. Steady gait noted. Pleasant affect noted. Patient denies pain. Patient has no complaints at this time. Upper left tooth toward back filled on Friday. Reported all findings to Dr. Basilio Cairo.

## 2011-07-06 NOTE — Progress Notes (Signed)
IMRT DEVICE NOTE  12.4 delivered field widths represent one IMRT treatment device. The code is [phone].   IMRT planning Note  IMRT is an important modality to deliver adequate dose to the patient's at risk tissues while sparing the patient's normal structures, including the:    Brainstem, cord, esophagus, parotid tissue, brain, Oral cavity . This justifies the use of IMRT in the patient's treatment.

## 2011-07-06 NOTE — Progress Notes (Signed)
   Weekly Management Note Current Dose:  200 cGy  Projected Dose: 6600 cGy   Narrative:  The patient presents for routine under treatment assessment.  CBCT/MVCT images/Port film x-rays were reviewed.  The chart was checked. He is doing well. He tolerated his first fraction of radiotherapy well this morning. He did not have any trouble breathing or relaxing.  Physical Findings: Weight: 297 lb 4.8 oz (134.854 kg). He is in no acute distress.  Impression:  The patient is tolerating radiotherapy.  Plan:  Continue radiotherapy as planned. I had a lengthy discussion with the patient and his wife regarding his MRI results. I did talk to the patient about this in person at simulation but wanted to review this with them in more detail. I was supposed to see the patient before his actual treatments, but there was confusion upstairs at the check in desk and he was never sent down to see me before his treatment as I had requested. In any case, following his first treatment, we reviewed the results of his MRI once more. 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 I am therefore tracking this nerve to the base of skull to prevent the base of skull recurrence. I explained that this will involve some radiation exposure of the right temporal lobe. It will also possibly affect his right cochlea and hearing. For this reason I will get some baseline audiogram tests. I spoke about the chance of decreased hearing as well as some possible short-term memory or swelling of cognition from the radiotherapy. However if this happens I think it is most likely to be subtle. He understands the importance of treating the nerve and is agreeable to the potential side effects. I also explained that he will have some irritation of his right cheek as I will be treating  facial nodes that extend from his nasal crease to his neck.  Of note, the patient is right-handed, and his left brain will not receive any  significant radiation dose.

## 2011-07-07 ENCOUNTER — Ambulatory Visit
Admission: RE | Admit: 2011-07-07 | Discharge: 2011-07-07 | Disposition: A | Discharge status: Home / Self Care | Payer: BC Managed Care – PPO | Source: Ambulatory Visit | Attending: Radiation Oncology | Admitting: Radiation Oncology

## 2011-07-08 ENCOUNTER — Ambulatory Visit
Admission: RE | Admit: 2011-07-08 | Discharge: 2011-07-08 | Disposition: A | Discharge status: Home / Self Care | Payer: BC Managed Care – PPO | Source: Ambulatory Visit | Attending: Radiation Oncology | Admitting: Radiation Oncology

## 2011-07-09 ENCOUNTER — Ambulatory Visit
Admission: RE | Admit: 2011-07-09 | Discharge: 2011-07-09 | Disposition: A | Discharge status: Home / Self Care | Payer: BC Managed Care – PPO | Source: Ambulatory Visit | Attending: Radiation Oncology | Admitting: Radiation Oncology

## 2011-07-10 ENCOUNTER — Other Ambulatory Visit: Payer: Self-pay | Admitting: Radiation Oncology

## 2011-07-10 ENCOUNTER — Ambulatory Visit
Admission: RE | Admit: 2011-07-10 | Discharge: 2011-07-10 | Disposition: A | Discharge status: Home / Self Care | Payer: BC Managed Care – PPO | Source: Ambulatory Visit | Attending: Radiation Oncology | Admitting: Radiation Oncology

## 2011-07-10 DIAGNOSIS — C770 Secondary and unspecified malignant neoplasm of lymph nodes of head, face and neck: Secondary | ICD-10-CM

## 2011-07-13 ENCOUNTER — Ambulatory Visit
Admission: RE | Admit: 2011-07-13 | Discharge: 2011-07-13 | Disposition: A | Discharge status: Home / Self Care | Payer: BC Managed Care – PPO | Source: Ambulatory Visit | Attending: Radiation Oncology | Admitting: Radiation Oncology

## 2011-07-13 ENCOUNTER — Encounter: Payer: Self-pay | Admitting: Radiation Oncology

## 2011-07-13 VITALS — Wt 300.9 lb

## 2011-07-13 DIAGNOSIS — C4441 Basal cell carcinoma of skin of scalp and neck: Secondary | ICD-10-CM

## 2011-07-13 MED ORDER — BIAFINE EX EMUL
Freq: Two times a day (BID) | CUTANEOUS | Status: DC
  Administered 2011-07-13: 11:00:00 via TOPICAL

## 2011-07-13 NOTE — Progress Notes (Signed)
Post sim ed completed, gave pt "Radiation and You" booklet, Biafine w/instructions. All questions answered, will review during treatment as needed. Pt has appt w/dietician 07/17/11. Pt had to return to work; per Dr Basilio Cairo pt will see Dr Dayton Scrape tomorrow for PUT visit. Pt's schedule flagged.

## 2011-07-14 ENCOUNTER — Other Ambulatory Visit: Payer: Self-pay | Admitting: Family Medicine

## 2011-07-14 ENCOUNTER — Ambulatory Visit
Admission: RE | Admit: 2011-07-14 | Discharge: 2011-07-14 | Disposition: A | Discharge status: Home / Self Care | Payer: BC Managed Care – PPO | Source: Ambulatory Visit | Attending: Radiation Oncology | Admitting: Radiation Oncology

## 2011-07-14 ENCOUNTER — Encounter: Payer: Self-pay | Admitting: Radiation Oncology

## 2011-07-14 VITALS — Wt 300.9 lb

## 2011-07-14 DIAGNOSIS — C4441 Basal cell carcinoma of skin of scalp and neck: Secondary | ICD-10-CM

## 2011-07-14 MED ORDER — LOSARTAN POTASSIUM 25 MG PO TABS: 25.0000 mg | ORAL_TABLET | Freq: Every day | ORAL | Status: DC

## 2011-07-14 MED ORDER — METFORMIN HCL 500 MG PO TABS: ORAL_TABLET | ORAL | Status: DC

## 2011-07-14 NOTE — Progress Notes (Signed)
Pt has no c/o today. Seeing Dr Kristin Bruins today. Applying Biafine to skin in tx area, advised to protect area from sunlight. Post sim ed completed yesterday.

## 2011-07-14 NOTE — Progress Notes (Signed)
Weekly Management Note:  Site:R face/neck/CN V3 Current Dose:  1400  cGy Projected Dose: 6600  cGy  Narrative: The patient is seen today for routine under treatment assessment. CBCT/MVCT images/port films were reviewed. The chart was reviewed.   He is without complaints today. He uses Biafine cream along his right face when necessary. He is to see Dr. Robin Searing today.  Physical Examination: There were no vitals filed for this visit..  Weight: 300 lb 14.4 oz (136.487 kg). There no significant skin changes. No palpable adenopathy in the neck. Oral cavity unremarkable to inspection.  Impression: Tolerating radiation therapy well.  Plan: Continue radiation therapy as planned.

## 2011-07-14 NOTE — Telephone Encounter (Signed)
RX sent

## 2011-07-15 ENCOUNTER — Ambulatory Visit
Admission: RE | Admit: 2011-07-15 | Discharge: 2011-07-15 | Disposition: A | Discharge status: Home / Self Care | Payer: BC Managed Care – PPO | Source: Ambulatory Visit | Attending: Radiation Oncology | Admitting: Radiation Oncology

## 2011-07-15 ENCOUNTER — Telehealth: Payer: Self-pay | Admitting: *Deleted

## 2011-07-16 ENCOUNTER — Ambulatory Visit
Admission: RE | Admit: 2011-07-16 | Discharge: 2011-07-16 | Disposition: A | Discharge status: Home / Self Care | Payer: BC Managed Care – PPO | Source: Ambulatory Visit | Attending: Radiation Oncology | Admitting: Radiation Oncology

## 2011-07-17 ENCOUNTER — Ambulatory Visit
Admission: RE | Admit: 2011-07-17 | Discharge: 2011-07-17 | Disposition: A | Discharge status: Home / Self Care | Payer: BC Managed Care – PPO | Source: Ambulatory Visit | Attending: Radiation Oncology | Admitting: Radiation Oncology

## 2011-07-17 ENCOUNTER — Encounter: Payer: Self-pay | Admitting: Nutrition

## 2011-07-17 ENCOUNTER — Telehealth: Payer: Self-pay | Admitting: Radiation Oncology

## 2011-07-17 ENCOUNTER — Other Ambulatory Visit: Payer: Self-pay | Admitting: Radiation Oncology

## 2011-07-17 DIAGNOSIS — R110 Nausea: Secondary | ICD-10-CM

## 2011-07-17 MED ORDER — PROMETHAZINE HCL 25 MG PO TABS: 25.0000 mg | ORAL_TABLET | Freq: Four times a day (QID) | ORAL | Status: DC | PRN

## 2011-07-17 NOTE — Telephone Encounter (Signed)
Patient phoned reporting "faint episode of nausea today." Patient denies emesis. Patient denies headache, dizziness or diarrhea. Patient has no other complaints. Patient requesting antiemetic. Reported all findings to Dr. Basilio Cairo. Dr. Basilio Cairo verbalized she would escribe Phenergan to [facility] [place]. Phoned patient with this information. Instructed patient phenergan may cause nausea. Patient verbalized understanding and expressed thanks for assistance.

## 2011-07-20 ENCOUNTER — Ambulatory Visit
Admission: RE | Admit: 2011-07-20 | Discharge: 2011-07-20 | Disposition: A | Discharge status: Home / Self Care | Payer: BC Managed Care – PPO | Source: Ambulatory Visit | Attending: Radiation Oncology | Admitting: Radiation Oncology

## 2011-07-20 ENCOUNTER — Encounter: Payer: Self-pay | Admitting: Radiation Oncology

## 2011-07-20 ENCOUNTER — Encounter: Payer: Self-pay | Admitting: Nutrition

## 2011-07-20 VITALS — BP 157/103 | HR 96 | Resp 18 | Wt 296.9 lb

## 2011-07-20 DIAGNOSIS — C4441 Basal cell carcinoma of skin of scalp and neck: Secondary | ICD-10-CM

## 2011-07-20 DIAGNOSIS — K1230 Oral mucositis (ulcerative), unspecified: Secondary | ICD-10-CM

## 2011-07-20 DIAGNOSIS — C4491 Basal cell carcinoma of skin, unspecified: Secondary | ICD-10-CM

## 2011-07-20 DIAGNOSIS — R110 Nausea: Secondary | ICD-10-CM

## 2011-07-20 MED ORDER — ONDANSETRON HCL 8 MG PO TABS: 8.0000 mg | ORAL_TABLET | Freq: Three times a day (TID) | ORAL | Status: AC | PRN

## 2011-07-20 MED ORDER — MAGIC MOUTHWASH W/LIDOCAINE: 10.0000 mL | Freq: Four times a day (QID) | ORAL | Status: DC

## 2011-07-20 NOTE — Progress Notes (Addendum)
   Weekly Management Note, head and neck cancer Current Dose:  2200 cGy  Projected Dose: 6600 cGy   Narrative:  The patient presents for routine under treatment assessment.  CBCT/MVCT images/Port film x-rays were reviewed.  The chart was checked. He has developed some sores in the right buccal mucosa. He's been nauseous but he gets too tired when he takes Phenergan. His weight is 5 pounds less than last week. His throat is slightly sore.  Physical Findings: Weight: 296 lb 14.4 oz (134.673 kg). He has early mucositis in the right buccal mucosa. His face is slightly erythematous on the right cheek  Impression:  The patient is tolerating radiotherapy.  Plan:  Continue radiotherapy as planned. I will prescribe Zofran for his nausea, Magic mouthwash for his mucositis. If he would like, for now he can apply Orajel or Anbesol over his inner cheek. He'll continue to be followed by her nutritionists. I recommended he not lose more than half a pound per week during radiotherapy.  I have reviewed the results of the patient's audiology testing which have been scanned into his electronic record.

## 2011-07-20 NOTE — Progress Notes (Signed)
Patient presents to the clinic today unaccompanied for an under treat visit with Dr. Basilio Cairo. Patient is alert and oriented to person, place, and time. No distress noted. Steady gait noted. Pleasant affect noted. Patient's blood pressure elevated despite taking BP medication this morning at 0700 as directed. Encouraged patient to contact PCP if bp continued to be elevated. Patient verbalized understanding. Patient reports taking Phenergan for nausea as directed but that it causes him to be drowsy. 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 Patient reports only one episode of emesis one week ago. Patient reports intermittent headaches. Patient reports sores inside his mouth on the right side that causes discomfort for which he takes motrin. Five pound weight loss noted since 07/14/2011. Patient reports he is getting closer to his "normal weight of 280." Patient reports some taste changes. Patient reports a sore throat when he swallows. Reported all findings to Dr. Basilio Cairo.

## 2011-07-21 ENCOUNTER — Ambulatory Visit
Admission: RE | Admit: 2011-07-21 | Discharge: 2011-07-21 | Disposition: A | Discharge status: Home / Self Care | Payer: BC Managed Care – PPO | Source: Ambulatory Visit | Attending: Radiation Oncology | Admitting: Radiation Oncology

## 2011-07-22 ENCOUNTER — Ambulatory Visit (HOSPITAL_COMMUNITY): Payer: Medicaid - Dental | Admitting: Dentistry

## 2011-07-22 ENCOUNTER — Ambulatory Visit
Admission: RE | Admit: 2011-07-22 | Discharge: 2011-07-22 | Disposition: A | Discharge status: Home / Self Care | Payer: BC Managed Care – PPO | Source: Ambulatory Visit | Attending: Radiation Oncology | Admitting: Radiation Oncology

## 2011-07-22 VITALS — BP 148/67 | HR 99 | Temp 97.2°F

## 2011-07-22 DIAGNOSIS — K117 Disturbances of salivary secretion: Secondary | ICD-10-CM

## 2011-07-22 DIAGNOSIS — K121 Other forms of stomatitis: Secondary | ICD-10-CM

## 2011-07-22 DIAGNOSIS — R432 Parageusia: Secondary | ICD-10-CM

## 2011-07-22 DIAGNOSIS — K1230 Oral mucositis (ulcerative), unspecified: Secondary | ICD-10-CM

## 2011-07-22 DIAGNOSIS — R1310 Dysphagia, unspecified: Secondary | ICD-10-CM

## 2011-07-22 DIAGNOSIS — K1233 Oral mucositis (ulcerative) due to radiation: Secondary | ICD-10-CM

## 2011-07-22 NOTE — Progress Notes (Signed)
Wednesday, July 22, 2011  BP: 148/67                   P:  99            T: 97.2            Wgt:297 lbs now still the same today.  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 Patient currently undergoing radiation therapy. The patient now presents for a periodic oral examination during radiation therapy.   Patient has completed 13 of 33 radiation treatments. Patient indicates that he had a dental cleaning and restoration with Dr. Blake Divine and Irene Limbo. The patient indicates that he had a restoration on the upper left molar but could not remember if he had the lower left premolar restoration.  REVIEW OF CHIEF COMPLAINTS: DRY MOUTH: Yes, sometimes HARD TO SWALLOW: Yes. it is starting to become sore. HURT TO SWALLOW: Yes, a little. SYMPTOM RELIEF:  Using Biotene Rinses. HOME ORAL HYGIENE REGIMEN:  BRUSHING: 2X a day  FLOSSING: 1X a day RINSING: see above FLUORIDE: Doing fluoride at night having no problems. TRISMUS EXERCISES: Maximim interincisal opening: 38 mm.   DENTAL EXAM: ORAL HYGIENE(PLAQUE): good oral hygiene-minimal plaque. Oral hygiene stressed. LOCATION OF MUCOSITIS: Right buccal mucosa at level of the occlusion. DESCRIPTION OF SALIVA: Decreased. Mild xerostomia. ANY EXPOSED BONE: None noted. OTHER WATCHED AREAS:  DIAGNOSES: 1.  Xerostomia  2.  Dysgeusia 3.  Dysphagia 4.  Odynophagia 5.  Mucositis 6.  Weight Loss   RECOMMENDATIONS: 1. Brush after meals and at bedtime. Use fluoride at bedtime. 2. Use trismus exercises as directed. 3. Use Biotene Rinse or salt water/baking soda rinses. 4. Multiple sips of water as needed. 5. RTC in two months for periodic oral examination status post radiation therapy . Call if problems before then. Dr. Kristin Bruins

## 2011-07-23 ENCOUNTER — Ambulatory Visit
Admission: RE | Admit: 2011-07-23 | Discharge: 2011-07-23 | Disposition: A | Discharge status: Home / Self Care | Payer: BC Managed Care – PPO | Source: Ambulatory Visit | Attending: Radiation Oncology | Admitting: Radiation Oncology

## 2011-07-24 ENCOUNTER — Ambulatory Visit: Payer: BC Managed Care – PPO | Admitting: Nutrition

## 2011-07-24 ENCOUNTER — Ambulatory Visit
Admission: RE | Admit: 2011-07-24 | Discharge: 2011-07-24 | Disposition: A | Discharge status: Home / Self Care | Payer: BC Managed Care – PPO | Source: Ambulatory Visit | Attending: Radiation Oncology | Admitting: Radiation Oncology

## 2011-07-24 NOTE — Progress Notes (Signed)
Steven Ibarra presents to nutrition followup.  His weight has increased to 302 pounds from 294 pounds on March 29th.  The patient reports that food is starting to taste bland.  He has had nausea, but he now takes his nausea medication around the clock, so that has resolved.  He is beginning to experience some mouth sores but was prescribed Magic mouthwash.  Bowels are moving with MiraLAX.  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  He reports he has been drinking 2% milk with [place] protein powder 3 times a day, but he reports hunger during the time that he is driving. Corliss Marcus is providing approximately 220 calories and 28 g of protein per serving.  This is an adequate amount of supplement for him.   NUTRITION DIAGNOSIS:  Food and nutrition related knowledge deficit continues.  INTERVENTION:  I have encouraged the patient to puree or blenderize more vegetables and complex carbohydrates, such as dry beans and peas, or lean protein sources, such as chicken or [place].  I have suggested low-fat yogurt and small amounts of pureed fruit.  I have encouraged him to consider cream soups if he begins to be unable to tolerate soft foods. I have encouraged him to add vegetables to these foods to help with his increased appetite.  I have stressed the importance of weight maintenance during treatment and not necessarily weight gain.  The patient verbalizes understanding.  I have provided additional samples of nutritional supplements for him today along with additional fact sheets.  MONITORING, EVALUATION AND GOALS:  The patient has been able to tolerate oral diet to minimize weight loss and maintain lean body mass.  He will continue to work to minimize gain during treatment.  NEXT VISIT:  Friday, May 10th, after radiation therapy.    ______________________________ Zenovia Jarred, RD, LDN Clinical Nutrition Specialist BN/MEDQ  D:  07/24/2011  T:  07/24/2011  Job:  [id number]

## 2011-07-27 ENCOUNTER — Ambulatory Visit
Admission: RE | Admit: 2011-07-27 | Discharge: 2011-07-27 | Disposition: A | Discharge status: Home / Self Care | Payer: BC Managed Care – PPO | Source: Ambulatory Visit | Attending: Radiation Oncology | Admitting: Radiation Oncology

## 2011-07-27 ENCOUNTER — Encounter: Payer: Self-pay | Admitting: Radiation Oncology

## 2011-07-27 VITALS — Wt 299.6 lb

## 2011-07-27 DIAGNOSIS — C4491 Basal cell carcinoma of skin, unspecified: Secondary | ICD-10-CM

## 2011-07-27 DIAGNOSIS — C4441 Basal cell carcinoma of skin of scalp and neck: Secondary | ICD-10-CM

## 2011-07-27 DIAGNOSIS — K219 Gastro-esophageal reflux disease without esophagitis: Secondary | ICD-10-CM

## 2011-07-27 MED ORDER — RANITIDINE HCL 150 MG PO TABS: 150.0000 mg | ORAL_TABLET | Freq: Two times a day (BID) | ORAL | Status: DC

## 2011-07-27 NOTE — Progress Notes (Signed)
   Weekly Management Note, Right Basal Cell cancer metastatic to the R Neck Current Dose:   3200 cGy  Projected Dose: 6600 cGy   Narrative:  The patient presents for routine under treatment assessment.  CBCT/MVCT images/Port film x-rays were reviewed.  The chart was checked. He is doing well. He has some soreness in his mouth and some odynophagia. His skin is turning pink. His taste buds have been affected by the radiotherapy. He also has some irritation in his right nostril.  Physical Findings: Weight: 299 lb 9.6 oz (135.898 kg). He has erythema over his right face  and right neck. No conjunctivitis. He has early mucositis in the right buccal mucosa. Mucous membranes are moist.  Impression:  The patient is tolerating radiotherapy.  Plan:  Continue radiotherapy as planned. Continue ibuprofen and magic mouthwash swish/swallow.

## 2011-07-27 NOTE — Progress Notes (Signed)
Pt states his throat hurts when he sneezes, Ibuprofen w/good relief. Taking Zofran 1-2 x/day, Phenergan nightly. Biafine to skin left neck, face; re-educated on avoiding sun exposure.

## 2011-07-28 ENCOUNTER — Ambulatory Visit
Admission: RE | Admit: 2011-07-28 | Discharge: 2011-07-28 | Disposition: A | Discharge status: Home / Self Care | Payer: BC Managed Care – PPO | Source: Ambulatory Visit | Attending: Radiation Oncology | Admitting: Radiation Oncology

## 2011-07-29 ENCOUNTER — Ambulatory Visit
Admission: RE | Admit: 2011-07-29 | Discharge: 2011-07-29 | Disposition: A | Discharge status: Home / Self Care | Payer: BC Managed Care – PPO | Source: Ambulatory Visit | Attending: Radiation Oncology | Admitting: Radiation Oncology

## 2011-07-30 ENCOUNTER — Ambulatory Visit
Admission: RE | Admit: 2011-07-30 | Discharge: 2011-07-30 | Disposition: A | Discharge status: Home / Self Care | Payer: BC Managed Care – PPO | Source: Ambulatory Visit | Attending: Radiation Oncology | Admitting: Radiation Oncology

## 2011-07-30 DIAGNOSIS — C4491 Basal cell carcinoma of skin, unspecified: Secondary | ICD-10-CM

## 2011-07-30 MED ORDER — ONDANSETRON HCL 8 MG PO TABS: 8.0000 mg | ORAL_TABLET | Freq: Three times a day (TID) | ORAL | Status: AC | PRN

## 2011-07-31 ENCOUNTER — Ambulatory Visit
Admission: RE | Admit: 2011-07-31 | Discharge: 2011-07-31 | Disposition: A | Discharge status: Home / Self Care | Payer: BC Managed Care – PPO | Source: Ambulatory Visit | Attending: Radiation Oncology | Admitting: Radiation Oncology

## 2011-08-03 ENCOUNTER — Encounter: Payer: Self-pay | Admitting: Radiation Oncology

## 2011-08-03 ENCOUNTER — Ambulatory Visit
Admission: RE | Admit: 2011-08-03 | Discharge: 2011-08-03 | Disposition: A | Discharge status: Home / Self Care | Payer: BC Managed Care – PPO | Source: Ambulatory Visit | Attending: Radiation Oncology | Admitting: Radiation Oncology

## 2011-08-03 DIAGNOSIS — C4491 Basal cell carcinoma of skin, unspecified: Secondary | ICD-10-CM

## 2011-08-03 DIAGNOSIS — C4441 Basal cell carcinoma of skin of scalp and neck: Secondary | ICD-10-CM

## 2011-08-03 MED ORDER — BIAFINE EX EMUL
CUTANEOUS | Status: DC | PRN
  Administered 2011-08-03: 1 via TOPICAL

## 2011-08-03 NOTE — Progress Notes (Signed)
21/33 fractions to right face and right neck.. Erythema noted with dryness of right face and  Right ear.  C/o right ear feeling  "stopped up" since this weekend with some decrease inhearing.  Oral mucosa with mild redness on the inside of right jaw.  Split noted in corner of right mouth and Pt. C/o discomfort in this area.

## 2011-08-03 NOTE — Progress Notes (Signed)
   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  CBCT/MVCT images/Port film x-rays were reviewed.  The chart was checked. He is doing relatively well. He reports skin irritation over his face and at the corner of his right mouth. His right ear he feels a little stuffed up. His mouth is a little bit sore but he doesn't note much soreness in his throat  Physical Findings: 295 pounds. He is in no acute distress. He shows erythema over his right face and ear and his right neck. He has some dry desquamation at the external right ear canal.  He has a tiny area of bleeding at the right  commissure of his lips. Oropharynx is slightly erythematous but the mucous membranes are moist and there is no sign of infection  Impression:  The patient is tolerating radiotherapy.  Plan:  Continue radiotherapy as planned. I recommended continuing Biafine over his face and ear and adding a Little Neosporin to the area of bleeding at the skin adjacent to the lip commissure. I explained to him that the stuffiness in his right ear is probably due to the dry desquamation of the skin cells. I expect that this will slowly resolve after completion of radiotherapy

## 2011-08-04 ENCOUNTER — Ambulatory Visit
Admission: RE | Admit: 2011-08-04 | Discharge: 2011-08-04 | Disposition: A | Discharge status: Home / Self Care | Payer: BC Managed Care – PPO | Source: Ambulatory Visit | Attending: Radiation Oncology | Admitting: Radiation Oncology

## 2011-08-05 ENCOUNTER — Ambulatory Visit
Admission: RE | Admit: 2011-08-05 | Discharge: 2011-08-05 | Disposition: A | Discharge status: Home / Self Care | Payer: BC Managed Care – PPO | Source: Ambulatory Visit | Attending: Radiation Oncology | Admitting: Radiation Oncology

## 2011-08-06 ENCOUNTER — Ambulatory Visit
Admission: RE | Admit: 2011-08-06 | Discharge: 2011-08-06 | Disposition: A | Discharge status: Home / Self Care | Payer: BC Managed Care – PPO | Source: Ambulatory Visit | Attending: Radiation Oncology | Admitting: Radiation Oncology

## 2011-08-07 ENCOUNTER — Ambulatory Visit
Admission: RE | Admit: 2011-08-07 | Discharge: 2011-08-07 | Disposition: A | Discharge status: Home / Self Care | Payer: BC Managed Care – PPO | Source: Ambulatory Visit | Attending: Radiation Oncology | Admitting: Radiation Oncology

## 2011-08-07 ENCOUNTER — Ambulatory Visit: Payer: BC Managed Care – PPO | Admitting: Nutrition

## 2011-08-07 NOTE — Progress Notes (Signed)
Steven Ibarra and his wife present for nutrition followup.  His weight has decreased to 294.4 pounds from 302 pounds on April 26th.  The patient reports that he is having taste alterations.  He has a little bit of nausea, but continues to take his nausea medication.  He does report some mouth sores, but he is tolerating some soft foods and he drinks protein shakes or Ensure or Glucerna 2-3 a day.  He reports drinking 6 or 7 sixteen-ounce bottles of water every day.  He does report episodes of constipation and is taking MiraLAX every 3rd day or so.  NUTRITION DIAGNOSIS:  Food and nutrition-related knowledge deficit continues.  INTERVENTION:  I have educated the patient to increase oral nutrition supplements to 2 protein shakes plus 2 Ensure Plus or Boost Plus daily, in addition to soft moist foods at meals and between as desired.  He is to continue free water intake.  He is to continue to take nausea medication and MiraLAX to help with his side effects.  He understands that his goal is to minimize weight loss and he is agreeable to interventions.  MONITORING/EVALUATION/GOALS:  The patient has been able to continue to eat soft foods and drinks oral nutrition supplements, but has had some weight loss.  NEXT VISIT:  Friday, May 17th.    ______________________________ Zenovia Jarred, RD, LDN Clinical Nutrition Specialist BN/MEDQ  D:  08/07/2011  T:  08/07/2011  Job:  [id number]

## 2011-08-10 ENCOUNTER — Ambulatory Visit
Admission: RE | Admit: 2011-08-10 | Discharge: 2011-08-10 | Disposition: A | Discharge status: Home / Self Care | Payer: BC Managed Care – PPO | Source: Ambulatory Visit | Attending: Radiation Oncology | Admitting: Radiation Oncology

## 2011-08-10 ENCOUNTER — Encounter: Payer: Self-pay | Admitting: Radiation Oncology

## 2011-08-10 DIAGNOSIS — C4491 Basal cell carcinoma of skin, unspecified: Secondary | ICD-10-CM

## 2011-08-10 DIAGNOSIS — R110 Nausea: Secondary | ICD-10-CM

## 2011-08-10 DIAGNOSIS — C4441 Basal cell carcinoma of skin of scalp and neck: Secondary | ICD-10-CM

## 2011-08-10 MED ORDER — ONDANSETRON HCL 8 MG PO TABS: 8.0000 mg | ORAL_TABLET | Freq: Three times a day (TID) | ORAL | Status: AC | PRN

## 2011-08-10 NOTE — Progress Notes (Signed)
HERE TODAY FOR PUT OF RIGHT NECK AND FACE.  APPETITE DECREASED DUE TO TASTE CHANGES, ALSO C/O SORE THROAT, RATES 4/10.  TAKES ADVIL AND GETS SOME RELIEF.  SKIN RED / BROWN WITH SMALL AMT OF DRY DESQUAMATION.   VS...135/84....99.....18......97

## 2011-08-10 NOTE — Progress Notes (Signed)
   Weekly Management Note Current Dose:   5200 cGy  Projected Dose:  6600 cGy   Narrative:  The patient presents for routine under treatment assessment.  CBCT/MVCT images/Port film x-rays were reviewed.  The chart was checked. He is doing fairly well. His nausea is controlled with ondansetron. His sore throat is 4/10. He does have a prescription for Magic mouthwash to use as needed. Advil is helping as well.  Physical Findings: Weight:  . Weight is 293 pounds. Blood pressure 135/84 pulse 99; temperature 97 degrees; respiratory rate 18. He is in no acute distress. His skin is erythematous with mild dry desquamation over his upper neck and ear. The small bleeding fissure at the right commissure of his lip is resolving. He has erythema within the right buccal mucosa. No thrush  Impression:  The patient is tolerating radiotherapy.  Plan:  Continue radiotherapy as planned. Patient requests a refill for ondansetron and I will fill that today. After completion of radiotherapy he will see me back in about one month. Next week he'll see my partner since I will be on leave for conference.

## 2011-08-11 ENCOUNTER — Ambulatory Visit
Admission: RE | Admit: 2011-08-11 | Discharge: 2011-08-11 | Disposition: A | Discharge status: Home / Self Care | Payer: BC Managed Care – PPO | Source: Ambulatory Visit | Attending: Radiation Oncology | Admitting: Radiation Oncology

## 2011-08-12 ENCOUNTER — Ambulatory Visit
Admission: RE | Admit: 2011-08-12 | Discharge: 2011-08-12 | Disposition: A | Discharge status: Home / Self Care | Payer: BC Managed Care – PPO | Source: Ambulatory Visit | Attending: Radiation Oncology | Admitting: Radiation Oncology

## 2011-08-13 ENCOUNTER — Ambulatory Visit
Admission: RE | Admit: 2011-08-13 | Discharge: 2011-08-13 | Disposition: A | Discharge status: Home / Self Care | Payer: BC Managed Care – PPO | Source: Ambulatory Visit | Attending: Radiation Oncology | Admitting: Radiation Oncology

## 2011-08-13 ENCOUNTER — Encounter: Payer: Self-pay | Admitting: Radiation Oncology

## 2011-08-14 ENCOUNTER — Encounter: Payer: Self-pay | Admitting: Nutrition

## 2011-08-14 ENCOUNTER — Ambulatory Visit
Admission: RE | Admit: 2011-08-14 | Discharge: 2011-08-14 | Disposition: A | Discharge status: Home / Self Care | Payer: BC Managed Care – PPO | Source: Ambulatory Visit | Attending: Radiation Oncology | Admitting: Radiation Oncology

## 2011-08-17 ENCOUNTER — Ambulatory Visit
Admission: RE | Admit: 2011-08-17 | Discharge: 2011-08-17 | Disposition: A | Discharge status: Home / Self Care | Payer: BC Managed Care – PPO | Source: Ambulatory Visit | Attending: Radiation Oncology | Admitting: Radiation Oncology

## 2011-08-17 VITALS — BP 131/84 | HR 104 | Temp 97.0°F | Resp 20 | Wt 289.8 lb

## 2011-08-17 DIAGNOSIS — C4441 Basal cell carcinoma of skin of scalp and neck: Secondary | ICD-10-CM

## 2011-08-17 MED ORDER — BIAFINE EX EMUL
Freq: Two times a day (BID) | CUTANEOUS | Status: DC
  Administered 2011-08-17: 11:00:00 via TOPICAL

## 2011-08-17 NOTE — Progress Notes (Signed)
Encounter addended by: Glennie Hawk, RN on: 08/17/2011 10:33 AM<BR>     Documentation filed: Inpatient MAR, Orders

## 2011-08-17 NOTE — Progress Notes (Signed)
Pt cont to have nausea, alternates Zofran w/Phenergan. Needs refill on generic Phenergan. Applying Biafine to tx area right neck 3 x/day. Gave another tube. Pt takes Advil 600 mg 2-3 x day w/good pain control. His pain is usually skin reaction related, occass sore throat.  Loss of appetite and fatigue. States "food doesn't taste right." Seeing dietician regularly. Completes tx Wed, has FU appt scheduled.

## 2011-08-17 NOTE — Progress Notes (Signed)
Weekly Management Note:  Site:R face/neck/CNV3 Current Dose:  6200  cGy Projected Dose: 6600  cGy  Narrative: The patient is seen today for routine under treatment assessment. CBCT/MVCT images/port films were reviewed. The chart was reviewed.   He is generally doing well and continues to work. His only complaint is that of nausea for which she takes Phenergan, alternating with Zofran. He requests a refill for his Phenergan. He takes Advil when necessary for  right oral cavity discomfort. He is not on any narcotics.  Physical Examination:  Filed Vitals:   08/17/11 0900  BP: 131/84  Pulse: 104  Temp:   Resp:   .  Weight: 289 lb 12.8 oz (131.452 kg). There is moderate erythema along the right face/neck with patchy dry desquamation. On inspection oral cavity there is mild erythema/he decide is along the right oral cavity with no evidence for candidiasis.  Impression: Tolerating radiation therapy well. He continues to work. He will finish his radiation therapy this Wednesday.  Plan: Continue radiation therapy as planned. I see that he has 2 refills of his Phenergan, and if these have already been filled I will e-prescribe a refill. He will finish his radiation therapy this Wednesday and then see Dr. Basilio Cairo for a one-month followup visit.

## 2011-08-18 ENCOUNTER — Ambulatory Visit
Admission: RE | Admit: 2011-08-18 | Discharge: 2011-08-18 | Disposition: A | Discharge status: Home / Self Care | Payer: BC Managed Care – PPO | Source: Ambulatory Visit | Attending: Radiation Oncology | Admitting: Radiation Oncology

## 2011-08-19 ENCOUNTER — Ambulatory Visit
Admission: RE | Admit: 2011-08-19 | Discharge: 2011-08-19 | Disposition: A | Discharge status: Home / Self Care | Payer: BC Managed Care – PPO | Source: Ambulatory Visit | Attending: Radiation Oncology | Admitting: Radiation Oncology

## 2011-08-19 ENCOUNTER — Encounter: Payer: Self-pay | Admitting: Radiation Oncology

## 2011-08-20 ENCOUNTER — Ambulatory Visit: Payer: BC Managed Care – PPO

## 2011-08-21 ENCOUNTER — Ambulatory Visit: Payer: BC Managed Care – PPO

## 2011-09-03 ENCOUNTER — Other Ambulatory Visit: Payer: Self-pay | Admitting: Family Medicine

## 2011-09-03 MED ORDER — ACCU-CHEK MULTICLIX LANCETS MISC: 1.0000 | Status: DC

## 2011-09-03 NOTE — Telephone Encounter (Signed)
RX sent

## 2011-09-08 ENCOUNTER — Other Ambulatory Visit: Payer: Self-pay

## 2011-09-08 MED ORDER — ROSUVASTATIN CALCIUM 20 MG PO TABS: 20.0000 mg | ORAL_TABLET | Freq: Every day | ORAL | Status: DC

## 2011-09-11 NOTE — Progress Notes (Signed)
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 Rueda tolerated radiotherapy well without complications.      His nausea responded to Ondansetron. His sore throat was treated with Magic Mouthwash. He had skin irritation (erythema with dry desquamation)  from radiotherapy as well.            PLAN: Routine followup in one month. Patient instructed to call if questions or worsening complaints in interim.

## 2011-09-22 ENCOUNTER — Ambulatory Visit (HOSPITAL_COMMUNITY): Payer: Medicaid - Dental | Admitting: Dentistry

## 2011-09-22 ENCOUNTER — Encounter (HOSPITAL_COMMUNITY): Payer: Self-pay | Admitting: Dentistry

## 2011-09-22 VITALS — BP 145/84 | HR 90 | Temp 97.2°F

## 2011-09-22 DIAGNOSIS — K117 Disturbances of salivary secretion: Secondary | ICD-10-CM

## 2011-09-22 DIAGNOSIS — R439 Unspecified disturbances of smell and taste: Secondary | ICD-10-CM

## 2011-09-22 DIAGNOSIS — K0530 Chronic periodontitis, unspecified: Secondary | ICD-10-CM

## 2011-09-22 DIAGNOSIS — R432 Parageusia: Secondary | ICD-10-CM

## 2011-09-22 NOTE — Progress Notes (Signed)
Tuesday, September 22, 2011   BP:145/84     P:  90            T:  97.2           Wgt: 280 lbs now. Patient weighed 295 lbs at start of treatments.  Steven Ibarra is a 56 year old male previously diagnosed with Basal cell Carcinoma metastatic to the Right submandibular node.   Patient underwent 07/06/2011 through 08/19/2011 with Dr. Basilio Cairo.  Patient now presents for a periodic oral examination after radiation therapy.    REVIEW OF CHIEF COMPLAINTS: DRY MOUTH: Yes. I am still dry. HARD TO SWALLOW: No. HURT TO SWALLOW: No TASTE CHANGES: Taste is coming back. SORES IN MOUTH: No TRISMUS SYMPTOMS: Having no problems with trismus. SYMPTOM RELIEF:  Using Biotene Rinses. HOME ORAL HYGIENE REGIMEN:  BRUSHING: 2 - 3X a day FLOSSING: 1X a day RINSING: see above FLUORIDE: Doing fluoride at night having no problems. TRISMUS EXERCISES: Maximum interincisal opening: 40 mm.   DENTAL EXAM: ORAL HYGIENE(PLAQUE): Good oral hygiene. Cleaning appointment scheduled for July, 2013. LOCATION OF MUCOSITIS: None noted. DESCRIPTION OF SALIVA: Decreased. Foamy. Moderate xerostomia. ANY EXPOSED BONE: None noted. OTHER WATCHED AREAS: #21 -distal. To follow with primary Dentist for restoration as needed.  DIAGNOSES: 1.  Xerostomia  2.  Dysgeusia-resolving 3. Chronic periodontitis   RECOMMENDATIONS: 1. Brush after meals and at bedtime. Use fluoride at bedtime. 2. Use trismus exercises as directed. 3. Use Biotene Rinse or salt water/baking soda rinses.  4. Multiple sips of water as needed. Consider use of Artificial saliva or Oral Balance for dry mouth as needed. 5. Discuss possible Salagen or Evoxac prescription with Dr. Rosezena Sensor. 6. Follow up with Drs. Bentsen and Irene Limbo as scheduled. Consider Rx for Prevident 5000 Plus by primary dentists.   Dr. Cindra Eves

## 2011-09-24 DIAGNOSIS — C4490 Unspecified malignant neoplasm of skin, unspecified: Secondary | ICD-10-CM | POA: Insufficient documentation

## 2011-09-25 ENCOUNTER — Other Ambulatory Visit (INDEPENDENT_AMBULATORY_CARE_PROVIDER_SITE_OTHER): Payer: BC Managed Care – PPO

## 2011-09-25 ENCOUNTER — Ambulatory Visit
Admission: RE | Admit: 2011-09-25 | Discharge: 2011-09-25 | Disposition: A | Discharge status: Home / Self Care | Payer: BC Managed Care – PPO | Source: Ambulatory Visit | Attending: Radiation Oncology | Admitting: Radiation Oncology

## 2011-09-25 ENCOUNTER — Encounter: Payer: Self-pay | Admitting: Radiation Oncology

## 2011-09-25 VITALS — BP 126/83 | HR 103 | Temp 97.8°F | Resp 18 | Wt 278.4 lb

## 2011-09-25 DIAGNOSIS — Z0000 Encounter for general adult medical examination without abnormal findings: Secondary | ICD-10-CM

## 2011-09-25 DIAGNOSIS — C4441 Basal cell carcinoma of skin of scalp and neck: Secondary | ICD-10-CM

## 2011-09-25 HISTORY — DX: Personal history of irradiation: Z92.3

## 2011-09-25 LAB — POCT URINALYSIS DIPSTICK
Blood, UA: NEGATIVE
Leukocytes, UA: NEGATIVE
Nitrite, UA: NEGATIVE
Protein, UA: NEGATIVE
Urobilinogen, UA: 0.2
pH, UA: 6.5

## 2011-09-25 LAB — HEPATIC FUNCTION PANEL
Albumin: 3.7 g/dL (ref 3.5–5.2)
Alkaline Phosphatase: 38 U/L — ABNORMAL LOW (ref 39–117)
Bilirubin, Direct: 0.1 mg/dL (ref 0.0–0.3)
Total Protein: 6 g/dL (ref 6.0–8.3)

## 2011-09-25 LAB — RENAL FUNCTION PANEL
BUN: 11 mg/dL (ref 6–23)
CO2: 28 mEq/L (ref 19–32)
Chloride: 105 mEq/L (ref 96–112)
GFR: 89.68 mL/min (ref >60.00)
Phosphorus: 3.2 mg/dL (ref 2.3–4.6)
Potassium: 4 mEq/L (ref 3.5–5.1)

## 2011-09-25 LAB — CBC
HCT: 39.8 % (ref 39.0–52.0)
Hemoglobin: 13.4 g/dL (ref 13.0–17.0)
MCHC: 33.7 g/dL (ref 30.0–36.0)
MCV: 89.4 fl (ref 78.0–100.0)
RDW: 14.3 % (ref 11.5–14.6)
WBC: 4.4 10*3/uL — ABNORMAL LOW (ref 4.5–10.5)

## 2011-09-25 LAB — LIPID PANEL
HDL: 26 mg/dL — ABNORMAL LOW (ref >39.00)
Triglycerides: 230 mg/dL — ABNORMAL HIGH (ref 0.0–149.0)
VLDL: 46 mg/dL — ABNORMAL HIGH (ref 0.0–40.0)

## 2011-09-25 LAB — PSA: PSA: 1.46 ng/mL (ref 0.10–4.00)

## 2011-09-25 LAB — HEMOGLOBIN A1C: Hgb A1c MFr Bld: 6.6 % — ABNORMAL HIGH (ref 4.6–6.5)

## 2011-09-25 NOTE — Progress Notes (Signed)
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 Patient has followed up with Dr. Kristin Bruins of dentistry. He is going to follow up with the community dentist as scheduled. He has some xerostomia  and resolving dysgeusia. He reports a spot on his leg. He reports decreased hearing; his right ear is stuffed up.     No scheduled followup with a dermatologist or an otolaryngologist.         He reports occasional dysphagia in the mid esophagus   with food.  ALLERGIES:   has no known allergies.  Meds: Current Outpatient Prescriptions  Medication Sig Dispense Refill  . ACCU-CHEK AVIVA PLUS test strip TEST 3-4 TIMES DAILY  100 strip  1  . Alum & Mag Hydroxide-Simeth (MAGIC MOUTHWASH W/LIDOCAINE) SOLN Take 10 mLs by mouth 4 (four) times daily. Swish and swallow to soothe the mouth and throat 30-40 minutes before meals  480 mL  5  . Ascorbic Acid (VITAMIN C) 1000 MG tablet Take 1,000 mg by mouth daily.      Marland Kitchen aspirin 81 MG tablet Take 81 mg by mouth at bedtime.        . B-D UF III MINI PEN NEEDLES 31G X 5 MM MISC 1 each by Other route 2 (two) times daily. Use twice daily as directed with Lantus      . cetirizine (ZYRTEC) 10 MG tablet Take 10 mg by mouth daily as needed. For allergies       . emollient (BIAFINE) cream Apply topically 2 (two) times daily.      . fenofibrate 160 MG tablet TAKE 1 TABLET BY MOUTH EVERY DAY  90 tablet  2  . HYDROcodone-acetaminophen (NORCO) 7.5-325 MG per tablet Take 1 tablet by  mouth every 6 (six) hours as needed.      Marland Kitchen ibuprofen (ADVIL,MOTRIN) 200 MG tablet Take 600 mg by mouth every 6 (six) hours as needed. For pain      . insulin glargine (LANTUS) 100 UNIT/ML injection Inject 50-100 Units into the skin 2 (two) times daily. 100 units in am and 50 units in pm      . Lancets (ACCU-CHEK MULTICLIX) lancets 1 each by Other route as directed.  102 each  2  . losartan (COZAAR) 25 MG tablet Take 1 tablet (25 mg total) by mouth daily.  30 tablet  2  . metFORMIN (GLUCOPHAGE) 500 MG tablet Takes 2 tablets (1000 mg) with breakfast and dinner and takes 1 tablet (500 mg) with lunch  90 tablet  3  . Misc Natural Products (OSTEO BI-FLEX TRIPLE STRENGTH PO) Take 2 tablets by mouth daily.      . Multiple Vitamin (MULITIVITAMIN WITH MINERALS) TABS Take 1 tablet by mouth daily.      . NON FORMULARY 1 each by Other route 4 (four) times daily. Accu-check multiclix lancets- use as directed      . promethazine (PHENERGAN) 25 MG tablet Take 25 mg by mouth every 6 (six) hours as needed.      Marland Kitchen  ranitidine (ZANTAC) 150 MG tablet Take 1 tablet (150 mg total) by mouth 2 (two) times daily.  60 tablet  2  . rosuvastatin (CRESTOR) 20 MG tablet Take 1 tablet (20 mg total) by mouth at bedtime.  30 tablet  1    Physical Findings: The patient is in no acute distress. Patient is alert and oriented.  weight is 278 lb 6.4 oz (126.281 kg). His oral temperature is 97.8 F (36.6 C). His blood pressure is 126/83 and his pulse is 103. His respiration is 18. .  Oropharynx demonstrates a small patch of resolving mucositis in the right buccal mucosa; this could be an area where he inadvertently bit his mucosa. Mucous membranes are somewhat dry. Neck demonstrates good skin healing with no palpable adenopathy. Right ear canal is notable for resolving moist desquamation. Right leg demonstrates a raised lesion, consistent with seborrheic keratosis, but I recommended that he follow up with dermatology for full skin  exam.  Lab Findings: Lab Results  Component Value Date   WBC 8.3 05/20/2011   HGB 14.7 05/20/2011   HCT 41.3 05/20/2011   MCV 84.5 05/20/2011   PLT 164 05/20/2011     Radiographic Findings: No results found.  Impression:  The patient is recovering from the effects of radiation.   Plan:   1) followup with dentistry is scheduled  2) referral will be made to dermatology for regular skin exams in light of the patient's history of advanced skin cancer   3) referral will be made back to otolaryngology for postoperative/post radiotherapy exam. I told the patient to discuss his "stuffed up" ear to see if this can be drained and to also mention his dysphagia. The dysphagia is also possibly from the radiotherapy, and if it is, it should heal with time.  4) I explained to the patient that his xerostomia and dysgeusia should continue to resolve with time  5) in terms of followup, I will follow him clinically with physical exams. I spoke about the facial sensory changes that would be a sign to prompt further imaging. However, I do not think that regular MRIs are necessary as they may very well introduce the risk of false positives and consequent anxiety. Recurrence at the base of skull or along the nerve in spite of his aggressive radiotherapy would not hold very good options for salvage. I spoke to the patient about the pros and cons of regular followup with MRIs versus physical exams and he feels comfortable with physical exams after our lengthy discussion.   Lonie Peak, MD

## 2011-09-25 NOTE — Progress Notes (Signed)
HERE TODAY FOR FU OF NECK.  STILL HAVING SOME SWALLOWING PROBLEMS, DECREASED HEARING OF RIGHT EAR.  RIGHT SIDE OF NOSE HAS OPENED UP AND IS BLOWING A LOT.  SPOT ON RIGHT UPPER LEG.  STILL HAS DRY MOUTH ON RIGHT SIDE.  TASTE HAS COME BACK ABOUT 60%.  SKIN LOOKS GREAT.  STILL CAN'T TASTE SWEET

## 2011-09-28 ENCOUNTER — Ambulatory Visit (INDEPENDENT_AMBULATORY_CARE_PROVIDER_SITE_OTHER): Payer: BC Managed Care – PPO | Admitting: Family Medicine

## 2011-09-28 ENCOUNTER — Encounter: Payer: Self-pay | Admitting: Family Medicine

## 2011-09-28 ENCOUNTER — Telehealth: Payer: Self-pay | Admitting: *Deleted

## 2011-09-28 VITALS — BP 126/86 | HR 82 | Temp 98.0°F | Ht 78.0 in | Wt 279.8 lb

## 2011-09-28 DIAGNOSIS — Z0000 Encounter for general adult medical examination without abnormal findings: Secondary | ICD-10-CM

## 2011-09-28 DIAGNOSIS — H6090 Unspecified otitis externa, unspecified ear: Secondary | ICD-10-CM

## 2011-09-28 DIAGNOSIS — C4491 Basal cell carcinoma of skin, unspecified: Secondary | ICD-10-CM

## 2011-09-28 DIAGNOSIS — K219 Gastro-esophageal reflux disease without esophagitis: Secondary | ICD-10-CM

## 2011-09-28 DIAGNOSIS — IMO0001 Reserved for inherently not codable concepts without codable children: Secondary | ICD-10-CM

## 2011-09-28 DIAGNOSIS — H60399 Other infective otitis externa, unspecified ear: Secondary | ICD-10-CM

## 2011-09-28 DIAGNOSIS — E782 Mixed hyperlipidemia: Secondary | ICD-10-CM

## 2011-09-28 DIAGNOSIS — G4733 Obstructive sleep apnea (adult) (pediatric): Secondary | ICD-10-CM

## 2011-09-28 DIAGNOSIS — I10 Essential (primary) hypertension: Secondary | ICD-10-CM

## 2011-09-28 MED ORDER — LOSARTAN POTASSIUM 25 MG PO TABS: 25.0000 mg | ORAL_TABLET | Freq: Every day | ORAL | Status: DC

## 2011-09-28 MED ORDER — PANTOPRAZOLE SODIUM 40 MG PO TBEC: 40.0000 mg | DELAYED_RELEASE_TABLET | Freq: Every day | ORAL | Status: DC

## 2011-09-28 MED ORDER — NEOMYCIN-POLYMYXIN-HC 3.5-10000-1 OT SOLN: 3.0000 [drp] | Freq: Two times a day (BID) | OTIC | Status: AC

## 2011-09-28 MED ORDER — RANITIDINE HCL 150 MG PO TABS: 150.0000 mg | ORAL_TABLET | Freq: Two times a day (BID) | ORAL | Status: DC

## 2011-09-28 NOTE — Telephone Encounter (Signed)
CALLED PATIENT TO INFORM OF APPT. WITH DR. DAN JONES ON 03-15-12 - ARRIVAL TIME - 9:15 AM, AND AN APPT. WITH  DR. BYERS ON 10-07-11- ARRIVAL TIME - 4:00 PM, SPOKE WITH PATIENT AND HE IS AWARE OF THESE APPTS.

## 2011-09-28 NOTE — Assessment & Plan Note (Addendum)
Energy slowly improving and he is pleased with his response to surgery. Will to continue with oncology and ENT. Reports some decreased hearing in right ear s/p his radiation. Has appt to have this evaluated

## 2011-09-28 NOTE — Patient Instructions (Addendum)
Preventive Care for Adults, Male A healthy lifestyle and preventative care can promote health and wellness. Preventative health guidelines for men include the following key practices:  A routine yearly physical is a good way to check with your caregiver about your health and preventative screening. It is a chance to share any concerns and updates on your health, and to receive a thorough exam.   Visit your dentist for a routine exam and preventative care every 6 months. Brush your teeth twice a day and floss once a day. Good oral hygiene prevents tooth decay and gum disease.   The frequency of eye exams is based on your age, health, family medical history, use of contact lenses, and other factors. Follow your caregiver's recommendations for frequency of eye exams.   Eat a healthy diet. Foods like vegetables, fruits, whole grains, low-fat dairy products, and lean protein foods contain the nutrients you need without too many calories. Decrease your intake of foods high in solid fats, added sugars, and salt. Eat the right amount of calories for you.Get information about a proper diet from your caregiver, if necessary.   Regular physical exercise is one of the most important things you can do for your health. Most adults should get at least 150 minutes of moderate-intensity exercise (any activity that increases your heart rate and causes you to sweat) each week. In addition, most adults need muscle-strengthening exercises on 2 or more days a week.   Maintain a healthy weight. The body mass index (BMI) is a screening tool to identify possible weight problems. It provides an estimate of body fat based on height and weight. Your caregiver can help determine your BMI, and can help you achieve or maintain a healthy weight.For adults 20 years and older:   A BMI below 18.5 is considered underweight.   A BMI of 18.5 to 24.9 is normal.   A BMI of 25 to 29.9 is considered overweight.   A BMI of 30 and above  is considered obese.   Maintain normal blood lipids and cholesterol levels by exercising and minimizing your intake of saturated fat. Eat a balanced diet with plenty of fruit and vegetables. Blood tests for lipids and cholesterol should begin at age 20 and be repeated every 5 years. If your lipid or cholesterol levels are high, you are over 50, or you are a high risk for heart disease, you may need your cholesterol levels checked more frequently.Ongoing high lipid and cholesterol levels should be treated with medicines if diet and exercise are not effective.   If you smoke, find out from your caregiver how to quit. If you do not use tobacco, do not start.   If you choose to drink alcohol, do not exceed 2 drinks per day. One drink is considered to be 12 ounces (355 mL) of beer, 5 ounces (148 mL) of wine, or 1.5 ounces (44 mL) of liquor.   Avoid use of street drugs. Do not share needles with anyone. Ask for help if you need support or instructions about stopping the use of drugs.   High blood pressure causes heart disease and increases the risk of stroke. Your blood pressure should be checked at least every 1 to 2 years. Ongoing high blood pressure should be treated with medicines, if weight loss and exercise are not effective.   If you are 45 to 55 years old, ask your caregiver if you should take aspirin to prevent heart disease.   Diabetes screening involves taking a blood   sample to check your fasting blood sugar level. This should be done once every 3 years, after age 45, if you are within normal weight and without risk factors for diabetes. Testing should be considered at a younger age or be carried out more frequently if you are overweight and have at least 1 risk factor for diabetes.   Colorectal cancer can be detected and often prevented. Most routine colorectal cancer screening begins at the age of 50 and continues through age 75. However, your caregiver may recommend screening at an earlier  age if you have risk factors for colon cancer. On a yearly basis, your caregiver may provide home test kits to check for hidden blood in the stool. Use of a small camera at the end of a tube, to directly examine the colon (sigmoidoscopy or colonoscopy), can detect the earliest forms of colorectal cancer. Talk to your caregiver about this at age 50, when routine screening begins. Direct examination of the colon should be repeated every 5 to 10 years through age 75, unless early forms of pre-cancerous polyps or small growths are found.   Hepatitis C blood testing is recommended for all people born from 1945 through 1965 and any individual with known risks for hepatitis C.   Practice safe sex. Use condoms and avoid high-risk sexual practices to reduce the spread of sexually transmitted infections (STIs). STIs include gonorrhea, chlamydia, syphilis, trichomonas, herpes, HPV, and human immunodeficiency virus (HIV). Herpes, HIV, and HPV are viral illnesses that have no cure. They can result in disability, cancer, and death.   A one-time screening for abdominal aortic aneurysm (AAA) and surgical repair of large AAAs by sound wave imaging (ultrasonography) is recommended for ages 65 to 75 years who are current or former smokers.   Healthy men should no longer receive prostate-specific antigen (PSA) blood tests as part of routine cancer screening. Consult with your caregiver about prostate cancer screening.   Testicular cancer screening is not recommended for adult males who have no symptoms. Screening includes self-exam, caregiver exam, and other screening tests. Consult with your caregiver about any symptoms you have or any concerns you have about testicular cancer.   Use sunscreen with skin protection factor (SPF) of 30 or more. Apply sunscreen liberally and repeatedly throughout the day. You should seek shade when your shadow is shorter than you. Protect yourself by wearing long sleeves, pants, a  wide-brimmed hat, and sunglasses year round, whenever you are outdoors.   Once a month, do a whole body skin exam, using a mirror to look at the skin on your back. Notify your caregiver of new moles, moles that have irregular borders, moles that are larger than a pencil eraser, or moles that have changed in shape or color.   Stay current with required immunizations.   Influenza. You need a dose every fall (or winter). The composition of the flu vaccine changes each year, so being vaccinated once is not enough.   Pneumococcal polysaccharide. You need 1 to 2 doses if you smoke cigarettes or if you have certain chronic medical conditions. You need 1 dose at age 65 (or older) if you have never been vaccinated.   Tetanus, diphtheria, pertussis (Tdap, Td). Get 1 dose of Tdap vaccine if you are younger than age 65 years, are over 65 and have contact with an infant, are a healthcare worker, or simply want to be protected from whooping cough. After that, you need a Td booster dose every 10 years. Consult your caregiver if   you have not had at least 3 tetanus and diphtheria-containing shots sometime in your life or have a deep or dirty wound.   HPV. This vaccine is recommended for males 13 through 55 years of age. This vaccine may be given to men 22 through 55 years of age who have not completed the 3 dose series. It is recommended for men through age 26 who have sex with men or whose immune system is weakened because of HIV infection, other illness, or medications. The vaccine is given in 3 doses over 6 months.   Measles, mumps, rubella (MMR). You need at least 1 dose of MMR if you were born in 1957 or later. You may also need a 2nd dose.   Meningococcal. If you are age 19 to 21 years and a first-year college student living in a residence hall, or have one of several medical conditions, you need to get vaccinated against meningococcal disease. You may also need additional booster doses.   Zoster (shingles).  If you are age 60 years or older, you should get this vaccine.   Varicella (chickenpox). If you have never had chickenpox or you were vaccinated but received only 1 dose, talk to your caregiver to find out if you need this vaccine.   Hepatitis A. You need this vaccine if you have a specific risk factor for hepatitis A virus infection, or you simply wish to be protected from this disease. The vaccine is usually given as 2 doses, 6 to 18 months apart.   Hepatitis B. You need this vaccine if you have a specific risk factor for hepatitis B virus infection or you simply wish to be protected from this disease. The vaccine is given in 3 doses, usually over 6 months.  Preventative Service / Frequency Ages 19 to 39  Blood pressure check.** / Every 1 to 2 years.   Lipid and cholesterol check.** / Every 5 years beginning at age 20.   Hepatitis C blood test.** / For any individual with known risks for hepatitis C.   Skin self-exam. / Monthly.   Influenza immunization.** / Every year.   Pneumococcal polysaccharide immunization.** / 1 to 2 doses if you smoke cigarettes or if you have certain chronic medical conditions.   Tetanus, diphtheria, pertussis (Tdap,Td) immunization. / A one-time dose of Tdap vaccine. After that, you need a Td booster dose every 10 years.   HPV immunization. / 3 doses over 6 months, if 26 and younger.   Measles, mumps, rubella (MMR) immunization. / You need at least 1 dose of MMR if you were born in 1957 or later. You may also need a 2nd dose.   Meningococcal immunization. / 1 dose if you are age 19 to 21 years and a first-year college student living in a residence hall, or have one of several medical conditions, you need to get vaccinated against meningococcal disease. You may also need additional booster doses.   Varicella immunization.** / Consult your caregiver.   Hepatitis A immunization.** / Consult your caregiver. 2 doses, 6 to 18 months apart.   Hepatitis B  immunization.** / Consult your caregiver. 3 doses usually over 6 months.  Ages 40 to 64  Blood pressure check.** / Every 1 to 2 years.   Lipid and cholesterol check.** / Every 5 years beginning at age 20.   Fecal occult blood test (FOBT) of stool. / Every year beginning at age 50 and continuing until age 75. You may not have to do this test if   you get colonoscopy every 10 years.   Flexible sigmoidoscopy** or colonoscopy.** / Every 5 years for a flexible sigmoidoscopy or every 10 years for a colonoscopy beginning at age 50 and continuing until age 75.   Hepatitis C blood test.** / For all people born from 1945 through 1965 and any individual with known risks for hepatitis C.   Skin self-exam. / Monthly.   Influenza immunization.** / Every year.   Pneumococcal polysaccharide immunization.** / 1 to 2 doses if you smoke cigarettes or if you have certain chronic medical conditions.   Tetanus, diphtheria, pertussis (Tdap/Td) immunization.** / A one-time dose of Tdap vaccine. After that, you need a Td booster dose every 10 years.   Measles, mumps, rubella (MMR) immunization. / You need at least 1 dose of MMR if you were born in 1957 or later. You may also need a 2nd dose.   Varicella immunization.**/ Consult your caregiver.   Meningococcal immunization.** / Consult your caregiver.   Hepatitis A immunization.** / Consult your caregiver. 2 doses, 6 to 18 months apart.   Hepatitis B immunization.** / Consult your caregiver. 3 doses, usually over 6 months.  Ages 65 and over  Blood pressure check.** / Every 1 to 2 years.   Lipid and cholesterol check.**/ Every 5 years beginning at age 20.   Fecal occult blood test (FOBT) of stool. / Every year beginning at age 50 and continuing until age 75. You may not have to do this test if you get colonoscopy every 10 years.   Flexible sigmoidoscopy** or colonoscopy.** / Every 5 years for a flexible sigmoidoscopy or every 10 years for a colonoscopy  beginning at age 50 and continuing until age 75.   Hepatitis C blood test.** / For all people born from 1945 through 1965 and any individual with known risks for hepatitis C.   Abdominal aortic aneurysm (AAA) screening.** / A one-time screening for ages 65 to 75 years who are current or former smokers.   Skin self-exam. / Monthly.   Influenza immunization.** / Every year.   Pneumococcal polysaccharide immunization.** / 1 dose at age 65 (or older) if you have never been vaccinated.   Tetanus, diphtheria, pertussis (Tdap, Td) immunization. / A one-time dose of Tdap vaccine if you are over 65 and have contact with an infant, are a healthcare worker, or simply want to be protected from whooping cough. After that, you need a Td booster dose every 10 years.   Varicella immunization. ** / Consult your caregiver.   Meningococcal immunization.** / Consult your caregiver.   Hepatitis A immunization. ** / Consult your caregiver. 2 doses, 6 to 18 months apart.   Hepatitis B immunization.** / Check with your caregiver. 3 doses, usually over 6 months.  **Family history and personal history of risk and conditions may change your caregiver's recommendations. Document Released: 05/12/2001 Document Revised: 03/05/2011 Document Reviewed: 08/11/2010 [employer] Patient Information 2012 [employer]. 

## 2011-09-29 MED ORDER — INSULIN GLARGINE 100 UNIT/ML ~~LOC~~ SOLN: SUBCUTANEOUS | Status: DC

## 2011-09-29 NOTE — Assessment & Plan Note (Signed)
Tolerating current meds, avoid trans fats, increase exercise and add Krill oil

## 2011-09-29 NOTE — Assessment & Plan Note (Signed)
lantus 90 units in am and 50 units in pm. Encouraged to avoid simple carbs and will continue to monitor

## 2011-09-30 ENCOUNTER — Encounter: Payer: Self-pay | Admitting: Family Medicine

## 2011-09-30 DIAGNOSIS — Z0000 Encounter for general adult medical examination without abnormal findings: Secondary | ICD-10-CM

## 2011-09-30 HISTORY — DX: Encounter for general adult medical examination without abnormal findings: Z00.00

## 2011-09-30 NOTE — Assessment & Plan Note (Signed)
Well controlled on repeat check today, no changes

## 2011-09-30 NOTE — Assessment & Plan Note (Signed)
Occasional symptoms encouraged ongoing use of Ranitidine and report worsening symptoms avoid offending foods

## 2011-09-30 NOTE — Assessment & Plan Note (Signed)
Using CPAP 

## 2011-09-30 NOTE — Progress Notes (Signed)
Patient ID: Steven Ibarra, male   DOB: 1955-11-30, 56 y.o.   MRN: [id number] Since his radiation he has had persistent scaly, itchy skin in right ear  PE: scaly, dry skin on pinna, cheek and in external canal right ear  A/P Otitis Externa: started on Vosol [facility] and he is to report if no improvement

## 2011-09-30 NOTE — Progress Notes (Signed)
Patient ID: Steven Ibarra, male   DOB: 27-Feb-1956, 56 y.o.   MRN: [id number] Steven Ibarra [id number] [birth year] 09/30/2011      Progress Note New Patient  Subjective  Chief Complaint  Chief Complaint  Patient presents with  . Annual Exam    physical    HPI  Patient is a 56 year old Caucasian male who is in today for an eye exam. 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 He had resection and then 6 weeks of radiation. He tolerated this well but continues to struggle with fatigue and notes some hearing loss in the right ear status post his targeted radiation. His blood sugars continue to be difficult to manage and he is doing roughly 90 units of Lantus in the morning and 50 units in the evening. Sees numbers in the morning at 80-120 but will see me numbers above 200. He denies any recent illness, fevers, chest pain, palpitations, shortness of breath, GI or GU complaints at this time. Overall he feels he has tolerated his treatments well and denies any significant depression or anxiety. Is trying to maintain a low-fat low-carb diet but does better some days than others.  Past Medical History  Diagnosis Date  . Hyperlipidemia   . Obesity   . Overweight 10/23/2009  . Mixed hyperlipidemia 10/23/2009  . ELEVATED BLOOD PRESSURE 04/23/2010  . DYSPNEA ON EXERTION 10/23/2009  . ALLERGIC RHINITIS, SEASONAL 10/23/2009  . OSA (obstructive sleep apnea)   . SLEEP APNEA, OBSTRUCTIVE 10/23/2009    Sleep study Done at [facility]  . Cancer   . Hearing loss   . Hypertension     Does not see a cardiologist, has not had a stress, echo   . GERD (gastroesophageal reflux disease)   . Diabetes mellitus approx 2003    type 2  . DIABETES MELLITUS, TYPE II 10/23/2009  . Skin cancer     on nose, 9-10 yrs ago  . Basal cell cancer 05/28/11    r suprahyoid, radical neck dissection  . S/P radiation therapy 07/06/11 - 08/19/11    Right Facial and Righ neck Nodes and right  Trigeminal  Nerve to Base of Skull/ Total Dose 6600 cGy/ 33 Fractions  . Basal cell carcinoma of neck 05/28/2011    r suprahyoid, radical neck dissection S/p 6 weeks of targeted radiation therapy   . Preventative health care 09/30/2011    Past Surgical History  Procedure Date  . Tonsillectomy and adenoidectomy   . Skin cancer removal   . Radical neck dissection 05/28/2011  . Tonsillectomy   . Radical neck dissection 05/28/2011    Procedure: RADICAL NECK DISSECTION;  Surgeon: Suzanna Obey, MD;  Location: [facility] OR;  Service: ENT;  Laterality: N/A;  Suprahyoid Neck Dissection    Family History  Problem Relation Age of Onset  . Other Mother     CHF  . Stroke Father   . Hyperlipidemia Father   . Heart disease Father     s/p valve replacement  . Hyperlipidemia Brother   . Obesity Brother   . Other Brother     Back pain  . Hyperlipidemia Brother   . Hypertension Brother   . Other Brother     Panic attacks  . Hyperlipidemia Brother   . Anesthesia problems Neg Hx     History   Social History  . Marital Status: Married    Spouse Name: N/A    Number of Children: N/A  .  Years of Education: N/A   Occupational History  . Not on file.   Social History Main Topics  . Smoking status: Former Smoker -- 1.0 packs/day for 9 years    Types: Cigarettes    Quit date: 03/31/1975  . Smokeless tobacco: Never Used  . Alcohol Use: 0.0 oz/week    0 drink(s) per week     4 beers a month, 06/12/11 rarely uses now  . Drug Use: No  . Sexually Active: Yes   Other Topics Concern  . Not on file   Social History Narrative   Patient is married.Patient with a history of smoking one pack per day for approximately 29 years from ages of [age]. Patient denies ever having used smokeless tobacco. Patient with rare use of alcohol.Mother died at age 57 from congestive heart failure complications. Father died at the age of 33 secondary to a stroke.    Current Outpatient Prescriptions on File Prior to Visit    Medication Sig Dispense Refill  . ACCU-CHEK AVIVA PLUS test strip TEST 3-4 TIMES DAILY  100 strip  1  . Ascorbic Acid (VITAMIN C) 1000 MG tablet Take 1,000 mg by mouth daily.      . B-D UF III MINI PEN NEEDLES 31G X 5 MM MISC 1 each by Other route 2 (two) times daily. Use twice daily as directed with Lantus      . cetirizine (ZYRTEC) 10 MG tablet Take 10 mg by mouth daily as needed. For allergies       . fenofibrate 160 MG tablet TAKE 1 TABLET BY MOUTH EVERY DAY  90 tablet  2  . ibuprofen (ADVIL,MOTRIN) 200 MG tablet Take 600 mg by mouth every 6 (six) hours as needed. For pain      . insulin glargine (LANTUS) 100 UNIT/ML injection 90 units in am and 50 units in pm  3 mL  1  . Lancets (ACCU-CHEK MULTICLIX) lancets 1 each by Other route as directed.  102 each  2  . losartan (COZAAR) 25 MG tablet Take 1 tablet (25 mg total) by mouth daily.  30 tablet  2  . metFORMIN (GLUCOPHAGE) 500 MG tablet Takes 2 tablets (1000 mg) with breakfast and dinner and takes 1 tablet (500 mg) with lunch  90 tablet  3  . Misc Natural Products (OSTEO BI-FLEX TRIPLE STRENGTH PO) Take 2 tablets by mouth daily.      . Multiple Vitamin (MULITIVITAMIN WITH MINERALS) TABS Take 1 tablet by mouth daily.      . NON FORMULARY 1 each by Other route 4 (four) times daily. Accu-check multiclix lancets- use as directed      . ranitidine (ZANTAC) 150 MG tablet Take 1 tablet (150 mg total) by mouth 2 (two) times daily.  60 tablet  11  . rosuvastatin (CRESTOR) 20 MG tablet Take 1 tablet (20 mg total) by mouth at bedtime.  30 tablet  1  . aspirin 81 MG tablet Take 81 mg by mouth at bedtime.        Marland Kitchen HYDROcodone-acetaminophen (NORCO) 7.5-325 MG per tablet Take 1 tablet by mouth every 6 (six) hours as needed.        No Known Allergies  Review of Systems  Review of Systems  Constitutional: Positive for malaise/fatigue. Negative for fever and chills.  HENT: Negative for hearing loss, nosebleeds and congestion.   Eyes: Negative for  discharge.  Respiratory: Negative for cough, sputum production, shortness of breath and wheezing.   Cardiovascular: Negative for chest  pain, palpitations and leg swelling.  Gastrointestinal: Negative for heartburn, nausea, vomiting, abdominal pain, diarrhea, constipation and blood in stool.  Genitourinary: Negative for dysuria, urgency, frequency and hematuria.  Musculoskeletal: Negative for myalgias, back pain and falls.  Skin: Negative for rash.  Neurological: Negative for dizziness, tremors, sensory change, focal weakness, loss of consciousness, weakness and headaches.  Endo/Heme/Allergies: Negative for polydipsia. Does not bruise/bleed easily.  Psychiatric/Behavioral: Negative for depression and suicidal ideas. The patient is not nervous/anxious and does not have insomnia.     Objective  BP 126/86  Pulse 82  Temp 98 F (36.7 C) (Temporal)  Ht 6\' 6"  (1.981 m)  Wt 279 lb 12.8 oz (126.916 kg)  BMI 32.33 kg/m2  SpO2 96%  Physical Exam  Physical Exam  Constitutional: He is oriented to person, place, and time and well-developed, well-nourished, and in no distress. No distress.  HENT:  Head: Normocephalic and atraumatic.  Eyes: Conjunctivae are normal.  Neck: Neck supple. No thyromegaly present.       1 inch scar anterior neck  Cardiovascular: Normal rate, regular rhythm and normal heart sounds.   No murmur heard. Pulmonary/Chest: Effort normal and breath sounds normal. No respiratory distress.  Abdominal: He exhibits no distension and no mass. There is no tenderness.  Musculoskeletal: He exhibits no edema.  Neurological: He is alert and oriented to person, place, and time.  Skin: Skin is warm.  Psychiatric: Memory, affect and judgment normal.       Assessment & Plan  Basal cell cancer Energy slowly improving and he is pleased with his response to surgery. Will to continue with oncology and ENT. Reports some decreased hearing in right ear s/p his radiation. Has appt to  have this evaluated  Diabetes mellitus lantus 90 units in am and 50 units in pm. Encouraged to avoid simple carbs and will continue to monitor  MIXED HYPERLIPIDEMIA Tolerating current meds, avoid trans fats, increase exercise and add Krill oil  Hypertension Well controlled on repeat check today, no changes  GERD (gastroesophageal reflux disease) Occasional symptoms encouraged ongoing use of Ranitidine and report worsening symptoms avoid offending foods  SLEEP APNEA, OBSTRUCTIVE Using CPAP  Preventative health care Encouraged heart healthy diabetic diet and regular exercise.  given IFOB test to complete, he declines referral to colonoscopy at today's visit but agrees to proceed in future.

## 2011-09-30 NOTE — Assessment & Plan Note (Signed)
Encouraged heart healthy diabetic diet and regular exercise.

## 2011-10-16 ENCOUNTER — Telehealth: Payer: Self-pay | Admitting: Family Medicine

## 2011-10-16 NOTE — Telephone Encounter (Signed)
Caller: Sony/Patient; Phone Number: [phone]; Message from caller: Pt calling today 10/16/11 regarding has appt on 10/20/11 with Christy to clean his ears, wants to know if Dr.  Abner Greenspan will be there so he can see him at same time.  PLEASE CALL PT BACK AT [phone] TO ADVISE.

## 2011-10-16 NOTE — Telephone Encounter (Signed)
I spoke with pt and tried to offer pt an appt with MD but he can't make any of the times. Pt would like to just keep the nurse visit appt and see if the ear irrigation will help his dizziness.

## 2011-10-20 ENCOUNTER — Ambulatory Visit: Payer: Self-pay

## 2011-10-20 ENCOUNTER — Ambulatory Visit (INDEPENDENT_AMBULATORY_CARE_PROVIDER_SITE_OTHER): Payer: BC Managed Care – PPO

## 2011-10-20 DIAGNOSIS — H6120 Impacted cerumen, unspecified ear: Secondary | ICD-10-CM

## 2011-10-20 NOTE — Progress Notes (Signed)
  Subjective:    Patient ID: Steven Ibarra, male    DOB: [birth year], 55 y.o.   MRN: [id number]  HPI    Review of Systems     Objective:   Physical Exam        Assessment & Plan:  Patient came in this morning for an ear irrigation in both ears. Patient handled both ear cleanings ok. Didn't see any swelling or redness inside either ear. Patient was told to give [place] a call in a couple days if this doesn't help.

## 2011-10-21 ENCOUNTER — Telehealth: Payer: Self-pay

## 2011-10-21 MED ORDER — METFORMIN HCL 500 MG PO TABS: ORAL_TABLET | ORAL | Status: DC

## 2011-10-21 NOTE — Telephone Encounter (Signed)
[facility] needed to verify that pt takes 5 Metformin daily. Verified yes and ok to fill with 150 tablets

## 2011-10-27 ENCOUNTER — Other Ambulatory Visit: Payer: Self-pay

## 2011-10-27 MED ORDER — GLUCOSE BLOOD VI STRP: ORAL_STRIP | Status: DC

## 2011-11-11 ENCOUNTER — Other Ambulatory Visit: Payer: Self-pay

## 2011-11-11 MED ORDER — ROSUVASTATIN CALCIUM 20 MG PO TABS: 20.0000 mg | ORAL_TABLET | Freq: Every day | ORAL | Status: DC

## 2011-12-01 ENCOUNTER — Other Ambulatory Visit: Payer: Self-pay

## 2011-12-01 MED ORDER — METFORMIN HCL 500 MG PO TABS: ORAL_TABLET | ORAL | Status: DC

## 2011-12-23 ENCOUNTER — Other Ambulatory Visit: Payer: Self-pay

## 2011-12-23 MED ORDER — INSULIN GLARGINE 100 UNIT/ML ~~LOC~~ SOLN: SUBCUTANEOUS | Status: DC

## 2011-12-30 ENCOUNTER — Other Ambulatory Visit: Payer: Self-pay

## 2011-12-30 MED ORDER — LOSARTAN POTASSIUM 25 MG PO TABS: 25.0000 mg | ORAL_TABLET | Freq: Every day | ORAL | Status: DC

## 2012-01-06 ENCOUNTER — Other Ambulatory Visit: Payer: Self-pay

## 2012-01-06 MED ORDER — INSULIN PEN NEEDLE 31G X 5 MM MISC: 1.0000 | Freq: Two times a day (BID) | Status: DC

## 2012-01-21 ENCOUNTER — Other Ambulatory Visit (INDEPENDENT_AMBULATORY_CARE_PROVIDER_SITE_OTHER): Payer: BC Managed Care – PPO

## 2012-01-21 DIAGNOSIS — I10 Essential (primary) hypertension: Secondary | ICD-10-CM

## 2012-01-21 DIAGNOSIS — E119 Type 2 diabetes mellitus without complications: Secondary | ICD-10-CM

## 2012-01-21 LAB — CBC
HCT: 40.5 % (ref 39.0–52.0)
Platelets: 167 10*3/uL (ref 150.0–400.0)
RBC: 4.57 Mil/uL (ref 4.22–5.81)
WBC: 4.7 10*3/uL (ref 4.5–10.5)

## 2012-01-22 ENCOUNTER — Other Ambulatory Visit: Payer: BC Managed Care – PPO

## 2012-01-22 LAB — RENAL FUNCTION PANEL
Albumin: 3.9 g/dL (ref 3.5–5.2)
CO2: 23 mEq/L (ref 19–32)
Calcium: 9.3 mg/dL (ref 8.4–10.5)
Creatinine, Ser: 1.1 mg/dL (ref 0.4–1.5)
Glucose, Bld: 94 mg/dL (ref 70–99)
Sodium: 145 mEq/L (ref 135–145)

## 2012-01-22 LAB — HEPATIC FUNCTION PANEL
ALT: 19 U/L (ref 0–53)
AST: 17 U/L (ref 0–37)
Total Bilirubin: 0.9 mg/dL (ref 0.3–1.2)
Total Protein: 6.3 g/dL (ref 6.0–8.3)

## 2012-01-22 LAB — LIPID PANEL
Cholesterol: 121 mg/dL (ref 0–200)
LDL Cholesterol: 59 mg/dL (ref 0–99)
Total CHOL/HDL Ratio: 5

## 2012-01-27 ENCOUNTER — Ambulatory Visit: Payer: BC Managed Care – PPO | Admitting: Radiation Oncology

## 2012-01-28 ENCOUNTER — Ambulatory Visit (INDEPENDENT_AMBULATORY_CARE_PROVIDER_SITE_OTHER): Payer: BC Managed Care – PPO | Admitting: Family Medicine

## 2012-01-28 ENCOUNTER — Encounter: Payer: Self-pay | Admitting: Family Medicine

## 2012-01-28 ENCOUNTER — Encounter: Payer: Self-pay | Admitting: Internal Medicine

## 2012-01-28 VITALS — BP 129/88 | HR 85 | Temp 98.2°F | Ht 78.0 in | Wt 285.8 lb

## 2012-01-28 DIAGNOSIS — E782 Mixed hyperlipidemia: Secondary | ICD-10-CM

## 2012-01-28 DIAGNOSIS — C4441 Basal cell carcinoma of skin of scalp and neck: Secondary | ICD-10-CM

## 2012-01-28 DIAGNOSIS — E039 Hypothyroidism, unspecified: Secondary | ICD-10-CM

## 2012-01-28 DIAGNOSIS — Z1211 Encounter for screening for malignant neoplasm of colon: Secondary | ICD-10-CM

## 2012-01-28 DIAGNOSIS — K219 Gastro-esophageal reflux disease without esophagitis: Secondary | ICD-10-CM

## 2012-01-28 DIAGNOSIS — Z23 Encounter for immunization: Secondary | ICD-10-CM

## 2012-01-28 HISTORY — DX: Hypothyroidism, unspecified: E03.9

## 2012-01-28 MED ORDER — LEVOTHYROXINE SODIUM 25 MCG PO TABS: 25.0000 ug | ORAL_TABLET | Freq: Every day | ORAL | Status: DC

## 2012-01-28 NOTE — Progress Notes (Signed)
Patient ID: Steven Ibarra, male   DOB: Sep 07, 1955, 56 y.o.   MRN: [id number] Steven Ibarra [id number] Mar 22, 1956 01/28/2012      Progress Note-Follow Up  Subjective  Chief Complaint  Chief Complaint  Patient presents with  . Follow-up    4 month    HPI  Patient is a 55 year old Caucasian male who is in today for followup. Overall he is doing well. 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 His following closely with surgery and oncology and there is no sign of recurrence this far. He continues to struggle with some fatigue but overall feels well. No recent illness. Sugars are well controlled. No fevers, chills, headache, chest pain, palpitations, shortness of breath, GU complaints. He does have some intermittent dyspepsia but very minimal and not concerning.  Past Medical History  Diagnosis Date  . Hyperlipidemia   . Obesity   . Overweight 10/23/2009  . Mixed hyperlipidemia 10/23/2009  . ELEVATED BLOOD PRESSURE 04/23/2010  . DYSPNEA ON EXERTION 10/23/2009  . ALLERGIC RHINITIS, SEASONAL 10/23/2009  . OSA (obstructive sleep apnea)   . SLEEP APNEA, OBSTRUCTIVE 10/23/2009    Sleep study Done at [facility]  . Cancer   . Hearing loss   . Hypertension     Does not see a cardiologist, has not had a stress, echo   . GERD (gastroesophageal reflux disease)   . Diabetes mellitus approx 2003    type 2  . DIABETES MELLITUS, TYPE II 10/23/2009  . Skin cancer     on nose, 9-10 yrs ago  . Basal cell cancer 05/28/11    r suprahyoid, radical neck dissection  . S/P radiation therapy 07/06/11 - 08/19/11    Right Facial and Righ neck Nodes and right Trigeminal  Nerve to Base of Skull/ Total Dose 6600 cGy/ 33 Fractions  . Basal cell carcinoma of neck 05/28/2011    r suprahyoid, radical neck dissection S/p 6 weeks of targeted radiation therapy   . Preventative health care 09/30/2011  . Hypothyroid 01/28/2012    Past Surgical History  Procedure Date  .  Tonsillectomy and adenoidectomy   . Skin cancer removal   . Radical neck dissection 05/28/2011  . Tonsillectomy   . Radical neck dissection 05/28/2011    Procedure: RADICAL NECK DISSECTION;  Surgeon: Suzanna Obey, MD;  Location: [facility] OR;  Service: ENT;  Laterality: N/A;  Suprahyoid Neck Dissection    Family History  Problem Relation Age of Onset  . Other Mother     CHF  . Stroke Father   . Hyperlipidemia Father   . Heart disease Father     s/p valve replacement  . Hyperlipidemia Brother   . Obesity Brother   . Other Brother     Back pain  . Hyperlipidemia Brother   . Hypertension Brother   . Other Brother     Panic attacks  . Hyperlipidemia Brother   . Anesthesia problems Neg Hx     History   Social History  . Marital Status: Married    Spouse Name: N/A    Number of Children: N/A  . Years of Education: N/A   Occupational History  . Not on file.   Social History Main Topics  . Smoking status: Former Smoker -- 1.0 packs/day for 9 years    Types: Cigarettes    Quit date: 03/31/1975  . Smokeless tobacco: Never Used  . Alcohol Use: 0.0 oz/week    0 drink(s)  per week     4 beers a month, 06/12/11 rarely uses now  . Drug Use: No  . Sexually Active: Yes   Other Topics Concern  . Not on file   Social History Narrative   Patient is married.Patient with a history of smoking one pack per day for approximately 29 years from ages of [age]. Patient denies ever having used smokeless tobacco. Patient with rare use of alcohol.Mother died at age 39 from congestive heart failure complications. Father died at the age of 87 secondary to a stroke.    Current Outpatient Prescriptions on File Prior to Visit  Medication Sig Dispense Refill  . Ascorbic Acid (VITAMIN C) 1000 MG tablet Take 1,000 mg by mouth daily.      . cetirizine (ZYRTEC) 10 MG tablet Take 10 mg by mouth daily as needed. For allergies       . fenofibrate 160 MG tablet TAKE 1 TABLET BY MOUTH EVERY DAY  90 tablet  2  .  glucose blood (ACCU-CHEK AVIVA PLUS) test strip Use as instructed  100 each  10  . ibuprofen (ADVIL,MOTRIN) 200 MG tablet Take 600 mg by mouth every 6 (six) hours as needed. For pain      . Insulin Pen Needle (B-D UF III MINI PEN NEEDLES) 31G X 5 MM MISC 1 each by Other route 2 (two) times daily. Use twice daily as directed with Lantus  100 each  2  . Lancets (ACCU-CHEK MULTICLIX) lancets 1 each by Other route as directed.  102 each  2  . losartan (COZAAR) 25 MG tablet Take 1 tablet (25 mg total) by mouth daily.  30 tablet  2  . metFORMIN (GLUCOPHAGE) 500 MG tablet Takes 2 tablets (1000 mg) with breakfast and dinner and takes 1 tablet (500 mg) with lunch  150 tablet  5  . Misc Natural Products (OSTEO BI-FLEX TRIPLE STRENGTH PO) Take 2 tablets by mouth daily.      . Multiple Vitamin (MULITIVITAMIN WITH MINERALS) TABS Take 1 tablet by mouth daily.      . NON FORMULARY 1 each by Other route 4 (four) times daily. Accu-check multiclix lancets- use as directed      . ranitidine (ZANTAC) 150 MG tablet Take 1 tablet (150 mg total) by mouth 2 (two) times daily.  60 tablet  11  . rosuvastatin (CRESTOR) 20 MG tablet Take 1 tablet (20 mg total) by mouth at bedtime.  30 tablet  3  . DISCONTD: insulin glargine (LANTUS) 100 UNIT/ML injection 90 units in am and 50 units in pm  45 mL  1  . levothyroxine (LEVOTHROID) 25 MCG tablet Take 1 tablet (25 mcg total) by mouth daily.  30 tablet  3    No Known Allergies  Review of Systems  Review of Systems  Constitutional: Positive for malaise/fatigue. Negative for fever.  HENT: Negative for congestion.   Eyes: Negative for discharge.  Respiratory: Negative for shortness of breath.   Cardiovascular: Negative for chest pain, palpitations and leg swelling.  Gastrointestinal: Negative for nausea, abdominal pain and diarrhea.  Genitourinary: Negative for dysuria.  Musculoskeletal: Negative for falls.  Skin: Negative for rash.  Neurological: Negative for loss of  consciousness and headaches.  Endo/Heme/Allergies: Negative for polydipsia.  Psychiatric/Behavioral: Negative for depression and suicidal ideas. The patient is not nervous/anxious and does not have insomnia.     Objective  BP 129/88  Pulse 85  Temp 98.2 F (36.8 C) (Temporal)  Ht 6\' 6"  (1.981 m)  Wt 285 lb 12.8 oz (129.638 kg)  BMI 33.03 kg/m2  SpO2 96%  Physical Exam  Physical Exam  Constitutional: He is oriented to person, place, and time and well-developed, well-nourished, and in no distress. No distress.  HENT:  Head: Normocephalic and atraumatic.  Eyes: Conjunctivae normal are normal.  Neck: Neck supple. No thyromegaly present.  Cardiovascular: Normal rate, regular rhythm and normal heart sounds.   No murmur heard. Pulmonary/Chest: Effort normal and breath sounds normal. No respiratory distress.  Abdominal: He exhibits no distension and no mass. There is no tenderness.  Musculoskeletal: He exhibits no edema.  Neurological: He is alert and oriented to person, place, and time.  Skin: Skin is warm.  Psychiatric: Memory, affect and judgment normal.    Lab Results  Component Value Date   TSH 7.18* 01/21/2012   Lab Results  Component Value Date   WBC 4.7 01/21/2012   HGB 13.9 01/21/2012   HCT 40.5 01/21/2012   MCV 88.6 01/21/2012   PLT 167.0 01/21/2012   Lab Results  Component Value Date   CREATININE 1.1 01/21/2012   BUN 19 01/21/2012   NA 145 01/21/2012   K 4.2 01/21/2012   CL 109 01/21/2012   CO2 23 01/21/2012   Lab Results  Component Value Date   ALT 19 01/21/2012   AST 17 01/21/2012   ALKPHOS 34* 01/21/2012   BILITOT 0.9 01/21/2012   Lab Results  Component Value Date   CHOL 121 01/21/2012   Lab Results  Component Value Date   HDL 25.60* 01/21/2012   Lab Results  Component Value Date   LDLCALC 59 01/21/2012   Lab Results  Component Value Date   TRIG 180.0* 01/21/2012   Lab Results  Component Value Date   CHOLHDL 5 01/21/2012      Assessment & Plan  Basal cell carcinoma of neck Is following closely with oncology and surgery still and doing well.  Hypothyroid Has an elevated TSH, will start Levothyroxine 25 mcg daily  GERD (gastroesophageal reflux disease) Well controlled at the present time  MIXED HYPERLIPIDEMIA He agrees to start [employer] and we will recheck his profile in 4-6 months. Avoid trans fats.  Diabetes mellitus hgba1c is under 7, minimize simple carbs but no change in medications. Given flu shot today

## 2012-01-28 NOTE — Assessment & Plan Note (Signed)
Has an elevated TSH, will start Levothyroxine 25 mcg daily

## 2012-01-28 NOTE — Assessment & Plan Note (Signed)
Is following closely with oncology and surgery still and doing well.

## 2012-01-28 NOTE — Assessment & Plan Note (Addendum)
hgba1c is under 7, minimize simple carbs but no change in medications. Given flu shot today

## 2012-01-28 NOTE — Assessment & Plan Note (Signed)
He agrees to start [employer] and we will recheck his profile in 4-6 months. Avoid trans fats.

## 2012-01-28 NOTE — Patient Instructions (Signed)
Take 2 fish oil til gone then consider switching to megaRed krill oil caps daily by [employer] (often coupons on website)  Hypothyroidism The thyroid is a large gland located in the lower front of your neck. The thyroid gland helps control metabolism. Metabolism is how your body handles food. It controls metabolism with the hormone thyroxine. When this gland is underactive (hypothyroid), it produces too little hormone.  CAUSES These include:   Absence or destruction of thyroid tissue.  Goiter due to iodine deficiency.  Goiter due to medications.  Congenital defects (since birth).  Problems with the pituitary. This causes a lack of TSH (thyroid stimulating hormone). This hormone tells the thyroid to turn out more hormone. SYMPTOMS  Lethargy (feeling as though you have no energy)  Cold intolerance  Weight gain (in spite of normal food intake)  Dry skin  Coarse hair  Menstrual irregularity (if severe, may lead to infertility)  Slowing of thought processes Cardiac problems are also caused by insufficient amounts of thyroid hormone. Hypothyroidism in the newborn is cretinism, and is an extreme form. It is important that this form be treated adequately and immediately or it will lead rapidly to retarded physical and mental development. DIAGNOSIS  To prove hypothyroidism, your caregiver may do blood tests and ultrasound tests. Sometimes the signs are hidden. It may be necessary for your caregiver to watch this illness with blood tests either before or after diagnosis and treatment. TREATMENT  Low levels of thyroid hormone are increased by using synthetic thyroid hormone. This is a safe, effective treatment. It usually takes about four weeks to gain the full effects of the medication. After you have the full effect of the medication, it will generally take another four weeks for problems to leave. Your caregiver may start you on low doses. If you have had heart problems the dose may be  gradually increased. It is generally not an emergency to get rapidly to normal. HOME CARE INSTRUCTIONS   Take your medications as your caregiver suggests. Let your caregiver know of any medications you are taking or start taking. Your caregiver will help you with dosage schedules.  As your condition improves, your dosage needs may increase. It will be necessary to have continuing blood tests as suggested by your caregiver.  Report all suspected medication side effects to your caregiver. SEEK MEDICAL CARE IF: Seek medical care if you develop:  Sweating.  Tremulousness (tremors).  Anxiety.  Rapid weight loss.  Heat intolerance.  Emotional swings.  Diarrhea.  Weakness. SEEK IMMEDIATE MEDICAL CARE IF:  You develop chest pain, an irregular heart beat (palpitations), or a rapid heart beat. MAKE SURE YOU:   Understand these instructions.  Will watch your condition.  Will get help right away if you are not doing well or get worse. Document Released: 03/16/2005 Document Revised: 06/08/2011 Document Reviewed: 11/04/2007 [facility] Patient Information 2013 [place], [place].

## 2012-01-28 NOTE — Assessment & Plan Note (Signed)
Well-controlled at the present time. ?

## 2012-01-29 ENCOUNTER — Ambulatory Visit: Payer: BC Managed Care – PPO | Admitting: Radiation Oncology

## 2012-01-29 ENCOUNTER — Ambulatory Visit: Payer: BC Managed Care – PPO | Admitting: Family Medicine

## 2012-02-05 ENCOUNTER — Ambulatory Visit
Admission: RE | Admit: 2012-02-05 | Discharge: 2012-02-05 | Disposition: A | Discharge status: Home / Self Care | Payer: BC Managed Care – PPO | Source: Ambulatory Visit | Attending: Radiation Oncology | Admitting: Radiation Oncology

## 2012-02-05 ENCOUNTER — Encounter: Payer: Self-pay | Admitting: Radiation Oncology

## 2012-02-05 VITALS — BP 118/84 | HR 86 | Temp 97.4°F | Resp 16 | Wt 288.7 lb

## 2012-02-05 DIAGNOSIS — C4441 Basal cell carcinoma of skin of scalp and neck: Secondary | ICD-10-CM

## 2012-02-05 DIAGNOSIS — C4490 Unspecified malignant neoplasm of skin, unspecified: Secondary | ICD-10-CM

## 2012-02-05 NOTE — Patient Instructions (Signed)
Methods to protect the skin from sun damage include:  Limited sun exposure.  Protection with clothing, hats, sunscreens.  It is never too late to begin a program of sun protection to limit further damage.

## 2012-02-05 NOTE — Progress Notes (Signed)
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  He is doing well. He denies any pain or changes in sensation throughout his face. He denies any new palpable nodes in his neck. He has a little more firmness and swelling in the tissue of his neck. His mouth is somewhat dry and he uses Biotene. and for this. It is most dry in the morning. He is undergoing screening colonoscopy on December 20. He has his first appointment with the dermatologist on December 17. He has not seen Dr. Jearld Fenton of otolaryngology recently. He believes he is to supposed to see him back on a when necessary basis but he is not sure he is going to call the clinic to verify this. He was found by his primary doctor to have some hyperthyroidism and has started levothyroxine 25 mcg one week ago. He saw his dentist on November 1 and is doing well from that standpoint.     He denies any difficulty or pain with swallowing. Appetite is good.                     ALLERGIES:   has no known allergies.  Meds: Current Outpatient Prescriptions  Medication Sig Dispense Refill  . Ascorbic Acid (VITAMIN C) 1000 MG tablet Take 1,000 mg by mouth daily.      . cetirizine (ZYRTEC) 10 MG tablet Take 10 mg by mouth daily as needed. For allergies       . fenofibrate 160 MG tablet TAKE 1 TABLET BY MOUTH EVERY DAY  90 tablet  2  . glucose blood (ACCU-CHEK AVIVA PLUS) test strip Use as instructed  100 each  10  . ibuprofen (ADVIL,MOTRIN) 200 MG  tablet Take 600 mg by mouth every 6 (six) hours as needed. For pain      . insulin glargine (LANTUS) 100 UNIT/ML injection 100 units in am and 50 units in pm      . Insulin Pen Needle (B-D UF III MINI PEN NEEDLES) 31G X 5 MM MISC 1 each by Other route 2 (two) times daily. Use twice daily as directed with Lantus  100 each  2  . Lancets (ACCU-CHEK MULTICLIX) lancets 1 each by Other route as directed.  102 each  2  . levothyroxine (LEVOTHROID) 25 MCG tablet Take 1 tablet (25 mcg total) by mouth daily.  30 tablet  3  . losartan (COZAAR) 25 MG tablet Take 1 tablet (25 mg total) by mouth daily.  30 tablet  2  . metFORMIN (GLUCOPHAGE) 500 MG tablet Takes 2 tablets (1000 mg) with breakfast and dinner and takes 1 tablet (500 mg) with lunch  150 tablet  5  . Misc Natural Products (OSTEO BI-FLEX TRIPLE STRENGTH PO) Take 2 tablets by mouth daily.      . Multiple Vitamin (MULITIVITAMIN WITH MINERALS) TABS Take 1 tablet by mouth daily.      . NON FORMULARY 1 each by Other route 4 (four) times daily.  Accu-check multiclix lancets- use as directed      . ranitidine (ZANTAC) 150 MG tablet Take 1 tablet (150 mg total) by mouth 2 (two) times daily.  60 tablet  11  . rosuvastatin (CRESTOR) 20 MG tablet Take 1 tablet (20 mg total) by mouth at bedtime.  30 tablet  3    Physical Findings: The patient is in no acute distress. Patient is alert and oriented.  weight is 288 lb 11.2 oz (130.953 kg). His oral temperature is 97.4 F (36.3 C). His blood pressure is 118/84 and his pulse is 86. His respiration is 16 and oxygen saturation is 95%. Marland Kitchen He is in no acute distress. Extraocular movements intact. No scleral icterus. Oropharynx demonstrates somewhat dry mucous membranes. No oral lesions. No pharyngeal lesions visible. Neck is notable for subcutaneous edema most notable in the right neck. Postsurgical changes noted. No palpable lymphadenopathy appreciated in the cervical or supraclavicular lymph nodes.  Lab Findings: Lab  Results  Component Value Date   WBC 4.7 01/21/2012   HGB 13.9 01/21/2012   HCT 40.5 01/21/2012   MCV 88.6 01/21/2012   PLT 167.0 01/21/2012    CMP     Component Value Date/Time   NA 145 01/21/2012 0832   K 4.2 01/21/2012 0832   CL 109 01/21/2012 0832   CO2 23 01/21/2012 0832   GLUCOSE 94 01/21/2012 0832   BUN 19 01/21/2012 0832   CREATININE 1.1 01/21/2012 0832   CALCIUM 9.3 01/21/2012 0832   PROT 6.3 01/21/2012 0832   ALBUMIN 3.9 01/21/2012 0832   ALBUMIN 3.9 01/21/2012 0832   AST 17 01/21/2012 0832   ALT 19 01/21/2012 0832   ALKPHOS 34* 01/21/2012 0832   BILITOT 0.9 01/21/2012 0832   GFRNONAA >90 05/20/2011 0845   GFRAA >90 05/20/2011 0845     Radiographic Findings: No results found.  Impression/Plan:   Doing well with no evidence of disease recurrence. Continue thyroid supplement as prescribed; we discussed that hypothyroidism can sometimes be a result of radiotherapy. Appreciate primary doctor's involvement in this.   I encouraged him to call otolaryngology to make sure that there is no need for followup appointment there. He is going to see dermatology in December for skin screenings. I will refer him to physical therapy for the subcutaneous edema in his neck. I told him there certain exercises and massage as that may help with this. I will see him back in 6 months for followup. I instructed him on how to continue shielding his skin from excessive sun exposure. He's been encouraged to call if he has any issues in the interim. _____________________________________   Lonie Peak, MD

## 2012-02-05 NOTE — Progress Notes (Signed)
Patient presents to the clinic today unaccompanied for follow up appointment with Dr. Basilio Cairo. Patient alert and oriented to person, place, and time. No distress noted. Steady gait noted. Pleasant affect noted. Patient denies pain at this time. Skin of the right side of the neck normal in appearance without hyperpigmentation. Patient reports a good appetite. Patient reports that his taste is returning slowly. Patient reports having thick saliva only first thing in the morning. Patient reports an occasional cough related to allergies. Patient denies difficulty or painful swallowing. Patient reports dry mouth worse in the morning for which he uses biotene. Patient reports he started taking levothyroxine 25 mcg one week ago. Patient reports that December 20 he is scheduled for an endoscopy and colonoscopy. Patient has gained 3 pounds since 10/31. Reported all findings to Dr. Basilio Cairo.

## 2012-02-09 ENCOUNTER — Telehealth: Payer: Self-pay | Admitting: *Deleted

## 2012-02-09 NOTE — Telephone Encounter (Signed)
CALLED PATIENT TO INFORM OF PT APPT. FOR NOV. 20 - ARRIVAL TIME - 2:30 PM , PATIENT TO REPORT TO [address], MAILED APPT. CARD, LVM FOR A RETURN CALL

## 2012-02-17 ENCOUNTER — Ambulatory Visit: Payer: BC Managed Care – PPO | Admitting: Physical Therapy

## 2012-02-22 ENCOUNTER — Other Ambulatory Visit: Payer: Self-pay

## 2012-02-22 MED ORDER — INSULIN GLARGINE 100 UNIT/ML ~~LOC~~ SOLN: SUBCUTANEOUS | Status: DC

## 2012-02-23 ENCOUNTER — Other Ambulatory Visit: Payer: Self-pay

## 2012-02-23 MED ORDER — FENOFIBRATE 160 MG PO TABS: 160.0000 mg | ORAL_TABLET | Freq: Every day | ORAL | Status: DC

## 2012-02-23 MED ORDER — INSULIN GLARGINE 100 UNIT/ML ~~LOC~~ SOLN: SUBCUTANEOUS | Status: DC

## 2012-03-02 ENCOUNTER — Ambulatory Visit: Payer: BC Managed Care – PPO | Attending: Radiation Oncology | Admitting: Physical Therapy

## 2012-03-02 DIAGNOSIS — IMO0001 Reserved for inherently not codable concepts without codable children: Secondary | ICD-10-CM | POA: Insufficient documentation

## 2012-03-02 DIAGNOSIS — M2569 Stiffness of other specified joint, not elsewhere classified: Secondary | ICD-10-CM | POA: Insufficient documentation

## 2012-03-02 DIAGNOSIS — I890 Lymphedema, not elsewhere classified: Secondary | ICD-10-CM | POA: Insufficient documentation

## 2012-03-03 ENCOUNTER — Encounter: Payer: Self-pay | Admitting: Internal Medicine

## 2012-03-03 ENCOUNTER — Ambulatory Visit (AMBULATORY_SURGERY_CENTER): Payer: BC Managed Care – PPO | Admitting: *Deleted

## 2012-03-03 VITALS — Ht 78.0 in | Wt 290.0 lb

## 2012-03-03 DIAGNOSIS — Z1211 Encounter for screening for malignant neoplasm of colon: Secondary | ICD-10-CM

## 2012-03-03 MED ORDER — MOVIPREP 100 G PO SOLR: ORAL | Status: DC

## 2012-03-07 ENCOUNTER — Encounter: Payer: Self-pay | Admitting: Physical Therapy

## 2012-03-09 ENCOUNTER — Ambulatory Visit: Payer: BC Managed Care – PPO | Admitting: Physical Therapy

## 2012-03-16 ENCOUNTER — Ambulatory Visit: Payer: BC Managed Care – PPO

## 2012-03-17 ENCOUNTER — Encounter: Payer: Self-pay | Admitting: Internal Medicine

## 2012-03-18 ENCOUNTER — Encounter: Payer: Self-pay | Admitting: Internal Medicine

## 2012-03-18 ENCOUNTER — Ambulatory Visit (AMBULATORY_SURGERY_CENTER): Payer: BC Managed Care – PPO | Admitting: Internal Medicine

## 2012-03-18 VITALS — BP 143/93 | HR 77 | Temp 96.5°F | Resp 15 | Ht 78.0 in | Wt 290.0 lb

## 2012-03-18 DIAGNOSIS — K635 Polyp of colon: Secondary | ICD-10-CM

## 2012-03-18 DIAGNOSIS — D126 Benign neoplasm of colon, unspecified: Secondary | ICD-10-CM

## 2012-03-18 DIAGNOSIS — Z1211 Encounter for screening for malignant neoplasm of colon: Secondary | ICD-10-CM

## 2012-03-18 LAB — GLUCOSE, CAPILLARY: Glucose-Capillary: 96 mg/dL (ref 70–99)

## 2012-03-18 MED ORDER — SODIUM CHLORIDE 0.9 % IV SOLN: 500.0000 mL | INTRAVENOUS | Status: DC

## 2012-03-18 NOTE — Progress Notes (Signed)
Patient did not experience any of the following events: a burn prior to discharge; a fall within the facility; wrong site/side/patient/procedure/implant event; or a hospital transfer or hospital admission upon discharge from the facility. (G8907) Patient did not have preoperative order for IV antibiotic SSI prophylaxis. (G8918)  

## 2012-03-18 NOTE — Patient Instructions (Addendum)

## 2012-03-18 NOTE — Op Note (Signed)
[facility] [address] [place], [address]   COLONOSCOPY PROCEDURE REPORT  PATIENT: Steven Ibarra, Steven Ibarra  MR#: [id number] BIRTHDATE: [birth year] , 56  yrs. old GENDER: Male ENDOSCOPIST: Beverley Fiedler, MD REFERRED XB:JYNWG, Stacy PROCEDURE DATE:  03/18/2012 PROCEDURE:   Colonoscopy with cold biopsy polypectomy ASA CLASS:   Class III INDICATIONS:average risk screening and first colonoscopy. MEDICATIONS: MAC sedation, administered by CRNA and propofol (Diprivan) 300mg  IV  DESCRIPTION OF PROCEDURE:   After the risks benefits and alternatives of the procedure were thoroughly explained, informed consent was obtained.  A digital rectal exam revealed no rectal mass.   The LB [id number]  endoscope was introduced through the anus and advanced to the cecum, which was identified by both the appendix and ileocecal valve. No adverse events experienced. The quality of the prep was good, using MoviPrep  The instrument was then slowly withdrawn as the colon was fully examined.   COLON FINDINGS: Two sessile polyps ranging between 3-80mm in size were found at the hepatic flexure and in the transverse colon. Polypectomy was performed with cold forceps.  All resections were complete and all polyp tissue was completely retrieved.   There was mild diverticulosis noted in the sigmoid colon with associated muscular hypertrophy.   The colon mucosa was otherwise normal. Retroflexed views revealed internal hemorrhoids. The time to cecum=3 minutes 39 seconds.  Withdrawal time=16 minutes 23 seconds. The scope was withdrawn and the procedure completed. COMPLICATIONS: There were no complications.  ENDOSCOPIC IMPRESSION: 1.   Two sessile polyps ranging between 3-48mm in size were found at the hepatic flexure and in the transverse colon; Polypectomy was performed with cold forceps 2.   There was mild diverticulosis noted in the sigmoid colon 3.   The colon mucosa was otherwise  normal  RECOMMENDATIONS: 1.  Await pathology results 2.  High fiber diet 3.  If the polyps removed today are proven to be adenomatous (pre-cancerous) polyps, you will need a repeat colonoscopy in 5 years.  Otherwise you should continue to follow colorectal cancer screening guidelines for "routine risk" patients with colonoscopy in 10 years.  You will receive a letter within 1-2 weeks with the results of your biopsy as well as final recommendations.  Please call my office if you have not received a letter after 3 weeks.   eSigned:  Beverley Fiedler, MD 03/18/2012 8:53 AM   cc: Reuel Derby, MD and The Patient

## 2012-03-21 ENCOUNTER — Ambulatory Visit: Payer: BC Managed Care – PPO

## 2012-03-21 ENCOUNTER — Telehealth: Payer: Self-pay | Admitting: *Deleted

## 2012-03-21 NOTE — Telephone Encounter (Signed)
  Follow up Call-  Call back number 03/18/2012  Post procedure Call Back phone  # [phone] cell  Permission to leave phone message Yes     No answer,left message.

## 2012-03-25 ENCOUNTER — Other Ambulatory Visit (INDEPENDENT_AMBULATORY_CARE_PROVIDER_SITE_OTHER): Payer: BC Managed Care – PPO

## 2012-03-25 ENCOUNTER — Encounter: Payer: Self-pay | Admitting: Internal Medicine

## 2012-03-25 DIAGNOSIS — I10 Essential (primary) hypertension: Secondary | ICD-10-CM

## 2012-03-25 LAB — TSH: TSH: 5.12 u[IU]/mL (ref 0.35–5.50)

## 2012-03-28 ENCOUNTER — Encounter: Payer: Self-pay | Admitting: Physical Therapy

## 2012-04-01 ENCOUNTER — Other Ambulatory Visit: Payer: Self-pay | Admitting: Family Medicine

## 2012-04-01 NOTE — Progress Notes (Signed)
Quick Note:  Patient Informed and voiced understanding ______ 

## 2012-04-07 ENCOUNTER — Telehealth: Payer: Self-pay

## 2012-04-07 MED ORDER — AMOXICILLIN-POT CLAVULANATE 875-125 MG PO TABS: 1.0000 | ORAL_TABLET | Freq: Two times a day (BID) | ORAL | Status: DC

## 2012-04-07 NOTE — Telephone Encounter (Signed)
OK to give him an antibiotics Augmentin 875 po bid x 10 days and Mucinex 600 mg bid also and if no improvement come in, increase rest and fluids

## 2012-04-07 NOTE — Telephone Encounter (Signed)
Pt left a message stating that he had a sinus infection and it has now moved into his chest and has a cough. Pt would like to know what MD suggests he should take? Please advise?

## 2012-04-07 NOTE — Telephone Encounter (Signed)
RX sent to pharmacy. Left a message for patient to return my call

## 2012-04-18 ENCOUNTER — Other Ambulatory Visit: Payer: Self-pay | Admitting: Family Medicine

## 2012-05-06 ENCOUNTER — Other Ambulatory Visit: Payer: Self-pay | Admitting: Radiation Oncology

## 2012-05-06 DIAGNOSIS — C4441 Basal cell carcinoma of skin of scalp and neck: Secondary | ICD-10-CM

## 2012-05-07 ENCOUNTER — Other Ambulatory Visit: Payer: Self-pay | Admitting: Family Medicine

## 2012-05-16 ENCOUNTER — Ambulatory Visit: Payer: BC Managed Care – PPO | Attending: Radiation Oncology | Admitting: Physical Therapy

## 2012-05-16 DIAGNOSIS — M2569 Stiffness of other specified joint, not elsewhere classified: Secondary | ICD-10-CM | POA: Insufficient documentation

## 2012-05-16 DIAGNOSIS — IMO0001 Reserved for inherently not codable concepts without codable children: Secondary | ICD-10-CM | POA: Insufficient documentation

## 2012-05-16 DIAGNOSIS — I890 Lymphedema, not elsewhere classified: Secondary | ICD-10-CM | POA: Insufficient documentation

## 2012-05-17 ENCOUNTER — Ambulatory Visit: Payer: BC Managed Care – PPO | Admitting: Physical Therapy

## 2012-05-24 ENCOUNTER — Ambulatory Visit: Payer: BC Managed Care – PPO | Admitting: Physical Therapy

## 2012-05-27 ENCOUNTER — Ambulatory Visit: Payer: BC Managed Care – PPO | Admitting: *Deleted

## 2012-05-28 ENCOUNTER — Other Ambulatory Visit: Payer: Self-pay | Admitting: Family Medicine

## 2012-05-30 ENCOUNTER — Other Ambulatory Visit: Payer: Self-pay | Admitting: Family Medicine

## 2012-05-31 ENCOUNTER — Other Ambulatory Visit: Payer: Self-pay | Admitting: Family Medicine

## 2012-06-02 ENCOUNTER — Ambulatory Visit: Payer: BC Managed Care – PPO | Attending: Radiation Oncology | Admitting: Physical Therapy

## 2012-06-02 DIAGNOSIS — M2569 Stiffness of other specified joint, not elsewhere classified: Secondary | ICD-10-CM | POA: Insufficient documentation

## 2012-06-02 DIAGNOSIS — I890 Lymphedema, not elsewhere classified: Secondary | ICD-10-CM | POA: Insufficient documentation

## 2012-06-02 DIAGNOSIS — IMO0001 Reserved for inherently not codable concepts without codable children: Secondary | ICD-10-CM | POA: Insufficient documentation

## 2012-06-09 ENCOUNTER — Ambulatory Visit: Payer: BC Managed Care – PPO | Admitting: Physical Therapy

## 2012-06-13 ENCOUNTER — Ambulatory Visit: Payer: BC Managed Care – PPO | Admitting: *Deleted

## 2012-06-22 ENCOUNTER — Ambulatory Visit: Payer: BC Managed Care – PPO | Admitting: Physical Therapy

## 2012-06-27 ENCOUNTER — Ambulatory Visit: Payer: BC Managed Care – PPO | Admitting: Physical Therapy

## 2012-06-29 ENCOUNTER — Other Ambulatory Visit (INDEPENDENT_AMBULATORY_CARE_PROVIDER_SITE_OTHER): Payer: BC Managed Care – PPO

## 2012-06-29 DIAGNOSIS — E785 Hyperlipidemia, unspecified: Secondary | ICD-10-CM

## 2012-06-29 DIAGNOSIS — E119 Type 2 diabetes mellitus without complications: Secondary | ICD-10-CM

## 2012-06-29 DIAGNOSIS — E039 Hypothyroidism, unspecified: Secondary | ICD-10-CM

## 2012-06-29 DIAGNOSIS — I10 Essential (primary) hypertension: Secondary | ICD-10-CM

## 2012-06-29 LAB — HEPATIC FUNCTION PANEL
Bilirubin, Direct: 0.2 mg/dL (ref 0.0–0.3)
Total Bilirubin: 0.9 mg/dL (ref 0.3–1.2)

## 2012-06-29 LAB — CBC
Hemoglobin: 14.1 g/dL (ref 13.0–17.0)
Platelets: 135 10*3/uL — ABNORMAL LOW (ref 150.0–400.0)
RDW: 14.1 % (ref 11.5–14.6)
WBC: 4.5 10*3/uL (ref 4.5–10.5)

## 2012-06-29 LAB — RENAL FUNCTION PANEL
Albumin: 3.7 g/dL (ref 3.5–5.2)
Calcium: 9 mg/dL (ref 8.4–10.5)
Chloride: 100 mEq/L (ref 96–112)
Phosphorus: 4.7 mg/dL — ABNORMAL HIGH (ref 2.3–4.6)
Potassium: 4 mEq/L (ref 3.5–5.1)

## 2012-06-29 LAB — HEMOGLOBIN A1C: Hgb A1c MFr Bld: 9.3 % — ABNORMAL HIGH (ref 4.6–6.5)

## 2012-06-29 LAB — LIPID PANEL
HDL: 27.2 mg/dL — ABNORMAL LOW (ref >39.00)
Total CHOL/HDL Ratio: 4
VLDL: 81 mg/dL — ABNORMAL HIGH (ref 0.0–40.0)

## 2012-06-29 NOTE — Progress Notes (Signed)
Labs only

## 2012-06-30 ENCOUNTER — Other Ambulatory Visit: Payer: Self-pay | Admitting: Family Medicine

## 2012-07-01 NOTE — Progress Notes (Signed)
Quick Note:  Patient Informed and voiced understanding. Med list updated for levothyroxine ______

## 2012-07-05 ENCOUNTER — Ambulatory Visit: Payer: BC Managed Care – PPO | Attending: Radiation Oncology

## 2012-07-05 DIAGNOSIS — M2569 Stiffness of other specified joint, not elsewhere classified: Secondary | ICD-10-CM | POA: Insufficient documentation

## 2012-07-05 DIAGNOSIS — IMO0001 Reserved for inherently not codable concepts without codable children: Secondary | ICD-10-CM | POA: Insufficient documentation

## 2012-07-05 DIAGNOSIS — I890 Lymphedema, not elsewhere classified: Secondary | ICD-10-CM | POA: Insufficient documentation

## 2012-07-06 ENCOUNTER — Other Ambulatory Visit: Payer: Self-pay | Admitting: Family Medicine

## 2012-07-06 ENCOUNTER — Encounter: Payer: Self-pay | Admitting: Family Medicine

## 2012-07-06 ENCOUNTER — Ambulatory Visit (INDEPENDENT_AMBULATORY_CARE_PROVIDER_SITE_OTHER): Payer: BC Managed Care – PPO | Admitting: Family Medicine

## 2012-07-06 VITALS — BP 132/90 | HR 95 | Temp 97.8°F | Ht 78.0 in | Wt 299.9 lb

## 2012-07-06 DIAGNOSIS — H6091 Unspecified otitis externa, right ear: Secondary | ICD-10-CM

## 2012-07-06 DIAGNOSIS — H60399 Other infective otitis externa, unspecified ear: Secondary | ICD-10-CM

## 2012-07-06 DIAGNOSIS — T148XXA Other injury of unspecified body region, initial encounter: Secondary | ICD-10-CM

## 2012-07-06 DIAGNOSIS — I10 Essential (primary) hypertension: Secondary | ICD-10-CM

## 2012-07-06 DIAGNOSIS — E785 Hyperlipidemia, unspecified: Secondary | ICD-10-CM

## 2012-07-06 DIAGNOSIS — E119 Type 2 diabetes mellitus without complications: Secondary | ICD-10-CM

## 2012-07-06 DIAGNOSIS — H60391 Other infective otitis externa, right ear: Secondary | ICD-10-CM

## 2012-07-06 DIAGNOSIS — E039 Hypothyroidism, unspecified: Secondary | ICD-10-CM

## 2012-07-06 DIAGNOSIS — E782 Mixed hyperlipidemia: Secondary | ICD-10-CM

## 2012-07-06 HISTORY — DX: Other injury of unspecified body region, initial encounter: T14.8XXA

## 2012-07-06 HISTORY — DX: Unspecified otitis externa, right ear: H60.91

## 2012-07-06 MED ORDER — ROSUVASTATIN CALCIUM 20 MG PO TABS: 20.0000 mg | ORAL_TABLET | Freq: Every day | ORAL | Status: DC

## 2012-07-06 MED ORDER — INSULIN LISPRO 100 UNIT/ML ~~LOC~~ SOLN: SUBCUTANEOUS | Status: DC

## 2012-07-06 MED ORDER — INSULIN GLARGINE 100 UNIT/ML ~~LOC~~ SOLN: SUBCUTANEOUS | Status: DC

## 2012-07-06 MED ORDER — CIPROFLOXACIN-DEXAMETHASONE 0.3-0.1 % OT SUSP: 4.0000 [drp] | Freq: Two times a day (BID) | OTIC | Status: DC

## 2012-07-06 MED ORDER — AMOXICILLIN-POT CLAVULANATE 875-125 MG PO TABS: 1.0000 | ORAL_TABLET | Freq: Two times a day (BID) | ORAL | Status: DC

## 2012-07-06 MED ORDER — LEVOTHYROXINE SODIUM 50 MCG PO TABS: 50.0000 ug | ORAL_TABLET | Freq: Every day | ORAL | Status: DC

## 2012-07-06 NOTE — Patient Instructions (Addendum)
  Start MegaRed krill oil caps daily Start a probiotic such as Digestive Advantage daily Otitis Externa Otitis externa is a bacterial or fungal infection of the outer ear canal. This is the area from the eardrum to the outside of the ear. Otitis externa is sometimes called "swimmer's ear." CAUSES  Possible causes of infection include:  Swimming in dirty water.  Moisture remaining in the ear after swimming or bathing.  Mild injury (trauma) to the ear.  Objects stuck in the ear (foreign body).  Cuts or scrapes (abrasions) on the outside of the ear. SYMPTOMS  The first symptom of infection is often itching in the ear canal. Later signs and symptoms may include swelling and redness of the ear canal, ear pain, and yellowish-white fluid (pus) coming from the ear. The ear pain may be worse when pulling on the earlobe. DIAGNOSIS  Your caregiver will perform a physical exam. A sample of fluid may be taken from the ear and examined for bacteria or fungi. TREATMENT  Antibiotic ear drops are often given for 10 to 14 days. Treatment may also include pain medicine or corticosteroids to reduce itching and swelling. PREVENTION   Keep your ear dry. Use the corner of a towel to absorb water out of the ear canal after swimming or bathing.  Avoid scratching or putting objects inside your ear. This can damage the ear canal or remove the protective wax that lines the canal. This makes it easier for bacteria and fungi to grow.  Avoid swimming in lakes, polluted water, or poorly chlorinated pools.  You may use ear drops made of rubbing alcohol and vinegar after swimming. Combine equal parts of white vinegar and alcohol in a bottle. Put 3 or 4 drops into each ear after swimming. HOME CARE INSTRUCTIONS   Apply antibiotic ear drops to the ear canal as prescribed by your caregiver.  Only take over-the-counter or prescription medicines for pain, discomfort, or fever as directed by your caregiver.  If you  have diabetes, follow any additional treatment instructions from your caregiver.  Keep all follow-up appointments as directed by your caregiver. SEEK MEDICAL CARE IF:   You have a fever.  Your ear is still red, swollen, painful, or draining pus after 3 days.  Your redness, swelling, or pain gets worse.  You have a severe headache.  You have redness, swelling, pain, or tenderness in the area behind your ear. MAKE SURE YOU:   Understand these instructions.  Will watch your condition.  Will get help right away if you are not doing well or get worse. Document Released: 03/16/2005 Document Revised: 06/08/2011 Document Reviewed: 04/02/2011 [facility] Patient Information 2013 [place], [place].

## 2012-07-06 NOTE — Telephone Encounter (Signed)
Please verify Dosage as there are no instructions for medication in patient's medication list/SLS Thanks.

## 2012-07-06 NOTE — Progress Notes (Signed)
Patient ID: Steven Ibarra, male   DOB: [birth year], 57 y.o.   MRN: [id number] Steven Ibarra [id number] 1955-12-13 07/06/2012      Progress Note-Follow Up  Subjective  Chief Complaint  Chief Complaint  Patient presents with  . Follow-up    6 month  . pulled muscle    in stomach    HPI  Patient is a 57 year old Caucasian male who is in today for followup. He notes for the last one to 2 weeks his right years been hurting. He's been struggling with increased fatigue and malaise have been present as well. Possible low-grade fevers. No significant chills. Some sneezing and 9 her nasal congestion also noted. No sore throat, chest pain, shortness of breath. He is struggling with some myalgias. He acknowledges she's been eating poorly the last 2 months. As his taste buds have improved after his chemotherapy he has been eating worse. He denies any depression and overall feels she's handling his health concerns will otherwise. Nose clear so far in his seasonal allergies except for some sneezing and  Past Medical History  Diagnosis Date  . Hyperlipidemia   . Obesity   . Overweight 10/23/2009  . Mixed hyperlipidemia 10/23/2009  . ELEVATED BLOOD PRESSURE 04/23/2010  . DYSPNEA ON EXERTION 10/23/2009  . ALLERGIC RHINITIS, SEASONAL 10/23/2009  . OSA (obstructive sleep apnea)   . SLEEP APNEA, OBSTRUCTIVE 10/23/2009    Sleep study Done at [facility]  . Hearing loss   . Hypertension     Does not see a cardiologist, has not had a stress, echo   . GERD (gastroesophageal reflux disease)   . Diabetes mellitus approx 2003    type 2  . DIABETES MELLITUS, TYPE II 10/23/2009  . S/P radiation therapy 07/06/11 - 08/19/11    Right Facial and Righ neck Nodes and right Trigeminal  Nerve to Base of Skull/ Total Dose 6600 cGy/ 33 Fractions  . Preventative health care 09/30/2011  . Hypothyroid 01/28/2012  . Cancer   . Skin cancer     on nose, 9-10 yrs ago  . Basal cell carcinoma of neck 05/28/2011    r suprahyoid,  radical neck dissection S/p 6 weeks of targeted radiation therapy   . Otitis externa of right ear 07/06/2012    Past Surgical History  Procedure Laterality Date  . Tonsillectomy and adenoidectomy    . Skin cancer removal    . Radical neck dissection  05/28/2011    Procedure: RADICAL NECK DISSECTION;  Surgeon: Suzanna Obey, MD;  Location: [facility] OR;  Service: ENT;  Laterality: N/A;  Suprahyoid Neck Dissection    Family History  Problem Relation Age of Onset  . Other Mother     CHF  . Stroke Father   . Hyperlipidemia Father   . Heart disease Father     s/p valve replacement  . Hyperlipidemia Brother   . Obesity Brother   . Other Brother     Back pain  . Hyperlipidemia Brother   . Hypertension Brother   . Other Brother     Panic attacks  . Hyperlipidemia Brother   . Anesthesia problems Neg Hx     History   Social History  . Marital Status: Married    Spouse Name: N/A    Number of Children: N/A  . Years of Education: N/A   Occupational History  . Not on file.   Social History Main Topics  . Smoking status: Former Smoker -- 1.00 packs/day for 9 years  Types: Cigarettes    Quit date: 03/31/1975  . Smokeless tobacco: Never Used  . Alcohol Use: No     Comment: 4 beers a month, 06/12/11 rarely uses now  . Drug Use: No  . Sexually Active: Yes   Other Topics Concern  . Not on file   Social History Narrative   Patient is married.   Patient with a history of smoking one pack per day for approximately 29 years from ages of [age]. Patient denies ever having used smokeless tobacco. Patient with rare use of alcohol.      Mother died at age 55 from congestive heart failure complications. Father died at the age of 56 secondary to a stroke.    Current Outpatient Prescriptions on File Prior to Visit  Medication Sig Dispense Refill  . Ascorbic Acid (VITAMIN C) 1000 MG tablet Take 1,000 mg by mouth daily.      . B-D UF III MINI PEN NEEDLES 31G X 5 MM MISC USE TWICE DAILY AS  DIRECTED WITH LANTUS  100 each  2  . cetirizine (ZYRTEC) 10 MG tablet Take 10 mg by mouth daily as needed. For allergies       . fenofibrate 160 MG tablet Take 1 tablet (160 mg total) by mouth daily.  90 tablet  2  . glucose blood (ACCU-CHEK AVIVA PLUS) test strip Use as instructed  100 each  10  . ibuprofen (ADVIL,MOTRIN) 200 MG tablet Take 600 mg by mouth every 6 (six) hours as needed. For pain      . Lancets (ACCU-CHEK MULTICLIX) lancets 1 each by Other route as directed.  102 each  2  . losartan (COZAAR) 25 MG tablet TAKE 1 TABLET EVERY DAY  30 tablet  2  . metFORMIN (GLUCOPHAGE) 500 MG tablet TAKE 2 TABLETS WITH BREAKFAST AND DINNER AND 1 TABLET WITH LUNCH  150 tablet  5  . Misc Natural Products (OSTEO BI-FLEX TRIPLE STRENGTH PO) Take 2 tablets by mouth daily.      . NON FORMULARY 1 each by Other route 4 (four) times daily. Accu-check multiclix lancets- use as directed      . ranitidine (ZANTAC) 300 MG tablet TAKE 1 TABLET AT BEDTIME  30 tablet  3   No current facility-administered medications on file prior to visit.    No Known Allergies  Review of Systems  Review of Systems  Constitutional: Positive for malaise/fatigue. Negative for fever.  HENT: Positive for ear pain and congestion.   Eyes: Negative for discharge.  Respiratory: Negative for shortness of breath.   Cardiovascular: Negative for chest pain, palpitations and leg swelling.  Gastrointestinal: Negative for nausea, abdominal pain and diarrhea.  Genitourinary: Negative for dysuria.  Musculoskeletal: Positive for myalgias. Negative for falls.  Skin: Negative for rash.  Neurological: Negative for loss of consciousness and headaches.  Endo/Heme/Allergies: Positive for polydipsia.  Psychiatric/Behavioral: Negative for depression and suicidal ideas. The patient is not nervous/anxious and does not have insomnia.     Objective  BP 132/90  Pulse 95  Temp(Src) 97.8 F (36.6 C) (Temporal)  Ht 6\' 6"  (1.981 m)  Wt 299 lb  14.4 oz (136.034 kg)  BMI 34.66 kg/m2  SpO2 93%  Physical Exam  Physical Exam  Constitutional: He is oriented to person, place, and time and well-developed, well-nourished, and in no distress. No distress.  HENT:  Head: Normocephalic and atraumatic.  Left Ear: External ear normal.  Nose: Nose normal.  right pinna and external canal erythematous and swollen slight  wax and fluid in canal, TM not erythematous  Eyes: Conjunctivae are normal.  Neck: Neck supple. No thyromegaly present.  Cardiovascular: Normal rate, regular rhythm and normal heart sounds.   No murmur heard. Pulmonary/Chest: Effort normal and breath sounds normal. No respiratory distress.  Abdominal: He exhibits no distension and no mass. There is no tenderness.  Musculoskeletal: He exhibits no edema.  Neurological: He is alert and oriented to person, place, and time.  Skin: Skin is warm.  Psychiatric: Memory, affect and judgment normal.    Lab Results  Component Value Date   TSH 6.04* 06/29/2012   Lab Results  Component Value Date   WBC 4.5 06/29/2012   HGB 14.1 06/29/2012   HCT 41.3 06/29/2012   MCV 88.0 06/29/2012   PLT 135.0* 06/29/2012   Lab Results  Component Value Date   CREATININE 0.9 06/29/2012   BUN 15 06/29/2012   NA 137 06/29/2012   K 4.0 06/29/2012   CL 100 06/29/2012   CO2 29 06/29/2012   Lab Results  Component Value Date   ALT 45 06/29/2012   AST 28 06/29/2012   ALKPHOS 52 06/29/2012   BILITOT 0.9 06/29/2012   Lab Results  Component Value Date   CHOL 109 06/29/2012   Lab Results  Component Value Date   HDL 27.20* 06/29/2012   Lab Results  Component Value Date   LDLCALC 59 01/21/2012   Lab Results  Component Value Date   TRIG 405.0* 06/29/2012   Lab Results  Component Value Date   CHOLHDL 4 06/29/2012     Assessment & Plan  Hypertension Well controlled, no changes  Diabetes mellitus hgba1c increased to 9.3.  He acknowledges that he has been eating very poorly. He is encouraged to consider the DASH diet  and minimize carbs. Given Humalog to use 4 units after dinner if sugar is above 200 after eating. Continue meds already prescribed.  MIXED HYPERLIPIDEMIA Continue Crestor and add a krill oil caps daily  Otitis externa of right ear Pinnae is swollen and erythematous. Given Augmentin and Ciprodex to use and he will report if not improving.

## 2012-07-06 NOTE — Assessment & Plan Note (Signed)
Pinnae is swollen and erythematous. Given Augmentin and Ciprodex to use and he will report if not improving.

## 2012-07-06 NOTE — Assessment & Plan Note (Signed)
hgba1c increased to 9.3.  He acknowledges that he has been eating very poorly. He is encouraged to consider the DASH diet and minimize carbs. Given Humalog to use 4 units after dinner if sugar is above 200 after eating. Continue meds already prescribed.

## 2012-07-06 NOTE — Assessment & Plan Note (Signed)
Well controlled, no changes 

## 2012-07-06 NOTE — Assessment & Plan Note (Signed)
Continue Crestor and add a krill oil caps daily

## 2012-07-12 ENCOUNTER — Ambulatory Visit: Payer: BC Managed Care – PPO

## 2012-07-14 ENCOUNTER — Telehealth: Payer: Self-pay | Admitting: Family Medicine

## 2012-07-14 DIAGNOSIS — H60391 Other infective otitis externa, right ear: Secondary | ICD-10-CM

## 2012-07-14 NOTE — Telephone Encounter (Signed)
Patient called back regarding this. He said that the fax # to fax this to is [phone]

## 2012-07-14 NOTE — Telephone Encounter (Signed)
Patient would like to know if Dr. Abner Greenspan would write a note excusing him from jury duty because of his diabetis

## 2012-07-17 NOTE — Telephone Encounter (Signed)
Please write him a letter excusing him from jury duty for his multiple medical problems, including diagnoses of cancer and diabetes this year.

## 2012-07-18 MED ORDER — CIPROFLOXACIN-DEXAMETHASONE 0.3-0.1 % OT SUSP: 4.0000 [drp] | Freq: Two times a day (BID) | OTIC | Status: DC

## 2012-07-18 NOTE — Telephone Encounter (Signed)
OK to continue ear drops, send in 1 rf but also if it is still hurting and swollen I need to see it again in the next week or so. It should be better by now

## 2012-07-18 NOTE — Telephone Encounter (Signed)
Pt called stating his ear is still sore- a little throbbing pain behind ear? Pt would like to know if he should continue the ear drops? If so, please send in a new RX for drops. Please advise?  Pt would also like the letter to be faxed to [phone] file#[phone]. I will write letter and fax.

## 2012-07-18 NOTE — Telephone Encounter (Signed)
RX sent and left a message on pts home answering machine to return my call

## 2012-07-19 NOTE — Telephone Encounter (Signed)
Patient informed and voiced understanding. Pt states the swelling is down just a little pain is left. Pt stated he will call back in a couple of days if this doesn't improve

## 2012-07-27 ENCOUNTER — Telehealth: Payer: Self-pay

## 2012-07-27 DIAGNOSIS — G4733 Obstructive sleep apnea (adult) (pediatric): Secondary | ICD-10-CM

## 2012-07-27 DIAGNOSIS — Z9989 Dependence on other enabling machines and devices: Secondary | ICD-10-CM

## 2012-07-27 NOTE — Telephone Encounter (Signed)
Yes pleasae give [facility] the order they need to titrate his settings

## 2012-07-27 NOTE — Telephone Encounter (Signed)
Pt called stating he is having trouble breathing at night. [facility] told pt to call MD to authorize a titrate testing to [facility]. Please advise?

## 2012-07-28 ENCOUNTER — Encounter: Payer: Self-pay | Admitting: *Deleted

## 2012-07-29 NOTE — Telephone Encounter (Signed)
Order faxed.

## 2012-08-01 ENCOUNTER — Telehealth: Payer: Self-pay | Admitting: *Deleted

## 2012-08-01 NOTE — Telephone Encounter (Signed)
Received call from Shanda Bumps at [facility] requesting clarification of order for CPAP. She stated pt already has a CPAP machine. Advised her per 07/29/12 office note that pt needs a titration. She requests new Rx to state auto titration and include the pressure range and duration of test (1 or 2 weeks).  Please advise.

## 2012-08-01 NOTE — Telephone Encounter (Signed)
This request is the same as the order we wrote on 07/29/12, I guess it did not get to them somehow. Please call and clarify where this order needs to get to.

## 2012-08-02 NOTE — Telephone Encounter (Signed)
Re-sent fax.

## 2012-08-03 ENCOUNTER — Encounter: Payer: Self-pay | Admitting: Radiation Oncology

## 2012-08-05 ENCOUNTER — Encounter: Payer: Self-pay | Admitting: Radiation Oncology

## 2012-08-05 ENCOUNTER — Ambulatory Visit
Admission: RE | Admit: 2012-08-05 | Discharge: 2012-08-05 | Disposition: A | Discharge status: Home / Self Care | Payer: BC Managed Care – PPO | Source: Ambulatory Visit | Attending: Radiation Oncology | Admitting: Radiation Oncology

## 2012-08-05 VITALS — BP 141/96 | HR 89 | Resp 18 | Ht 78.0 in | Wt 295.0 lb

## 2012-08-05 DIAGNOSIS — G529 Cranial nerve disorder, unspecified: Secondary | ICD-10-CM

## 2012-08-05 DIAGNOSIS — C4441 Basal cell carcinoma of skin of scalp and neck: Secondary | ICD-10-CM

## 2012-08-05 NOTE — Progress Notes (Signed)
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  Had   problems with right ear pain in end of March/early April as well as right facial sensory changes (warmth, perhaps some tingling). Also in April  Had unsteadiness (I was walking into walls).  He felt quite poor overall.  He was found to have the following on PE by Dr Abner Greenspan on 07/06/12: right pinna and external canal erythematous and swollen slight wax and fluid in canal, TM not erythematous .  Started ABX, all symptoms largely resolved. Today, he has some mild b/l tinnitus.  He is seeing PT and doing exercises/ massage at home for his neck edema. Stable right facial weakness in lower face postoperatively.                 ALLERGIES:  has No Known Allergies.  Meds: Current Outpatient Prescriptions  Medication Sig Dispense Refill  . Ascorbic Acid (VITAMIN C) 1000 MG tablet Take 1,000 mg by mouth daily.      . B-D UF III MINI PEN NEEDLES 31G X 5 MM MISC USE TWICE DAILY AS DIRECTED WITH LANTUS  100 each  2  . cetirizine (ZYRTEC) 10 MG tablet Take 10 mg by mouth daily as needed. For allergies       . fenofibrate 160 MG tablet Take 1 tablet (160 mg total) by mouth daily.  90 tablet  2  . glucose blood (ACCU-CHEK AVIVA PLUS) test strip Use as instructed  100 each  10  . ibuprofen (ADVIL,MOTRIN) 200 MG tablet Take 600 mg by mouth every 6 (six) hours as needed. For pain      . insulin glargine (LANTUS SOLOSTAR) 100 UNIT/ML injection 100 units SQ in am  and 50 units SQ in pm  4500 mL  3  . insulin glargine (LANTUS SOLOSTAR) 100 UNIT/ML injection Inject 100 units in AM and 50 ml units in PM  45 mL  3  . insulin lispro (HUMALOG) 100 UNIT/ML injection 4 units SQ after dinner for blood sugars>200 and can titrate up as needed  10 mL  1  . Lancets (ACCU-CHEK MULTICLIX) lancets 1 each by Other route as directed.  102 each  2  . levothyroxine (SYNTHROID, LEVOTHROID) 50 MCG tablet Take 1 tablet (50 mcg total) by mouth daily.  90 tablet  3  . losartan (COZAAR) 25 MG tablet TAKE 1 TABLET EVERY DAY  30 tablet  2  . metFORMIN (GLUCOPHAGE) 500 MG tablet TAKE 2 TABLETS WITH BREAKFAST AND DINNER AND 1 TABLET WITH LUNCH  150 tablet  5  . Misc Natural Products (OSTEO BI-FLEX TRIPLE STRENGTH PO) Take 2 tablets by mouth daily.      . NON FORMULARY 1 each by Other route 4 (four) times daily. Accu-check multiclix lancets- use as directed      . ranitidine (ZANTAC) 300 MG tablet TAKE 1 TABLET AT BEDTIME  30 tablet  3  . rosuvastatin (CRESTOR) 20 MG tablet Take 1 tablet (20 mg  total) by mouth daily.  30 tablet  3  . amoxicillin-clavulanate (AUGMENTIN) 875-125 MG per tablet Take 1 tablet by mouth 2 (two) times daily.  20 tablet  0  . ciprofloxacin-dexamethasone (CIPRODEX) otic suspension Place 4 drops into the right ear 2 (two) times daily.  7.5 mL  0   No current facility-administered medications for this encounter.    Physical Findings: The patient is in no acute distress. Patient is alert and oriented.  height is 6\' 6"  (1.981 m) and weight is 295 lb (133.811 kg). His blood pressure is 141/96 and his pulse is 89. His respiration is 18. .  General: Alert and oriented, in no acute distress HEENT: Head is normocephalic. Pupils are  round and reactive to light. Extraocular movements are intact. Oropharynx is clear. Mild right facial weakness, lower face. Tympanic membranes clear (but partly obstructed by significant cerumen. Denies numbness in face. Neck: Neck is with  modest residual edema, no palpable cervical or supraclavicular lymphadenopathy.    Lab Findings: Lab Results  Component Value Date   WBC 4.5 06/29/2012   HGB 14.1 06/29/2012   HCT 41.3 06/29/2012   MCV 88.0 06/29/2012   PLT 135.0* 06/29/2012    Radiographic Findings: No results found.  Impression/Plan: I am reassured that his symptoms have improved since April, but his history of facial sensory changes is a little concerning and I would like to rule out CN V recurrence with an MRI.  We will order this to be done.  Will see him back in 6 month, and get back in touch with pt re: the results of MRI which I hope will be reassuring.  Recommended f/u soon with Derm (scab of concern on ear) and ENT (for cerumen, post-op followup) - pt will call these MDs to reinitiate visits with them.  I spent 20 minutes minutes face to face with the patient and more than 50% of that time was spent in counseling and/or coordination of care. _____________________________________   Lonie Peak, MD

## 2012-08-05 NOTE — Progress Notes (Signed)
Patient presents to the clinic today unaccompanied for follow up with Dr. Basilio Cairo. Patient alert and oriented to person, place, and time. No distress noted. Steady gait noted. Pleasant affect noted. Patient denies pain at this time. Patient reports mild ringing in the ears. Patient reports 3 weeks ago he had a bad right ear infection requiring oral and otic medications. Patient reports during this same time frame he expressed extreme fatigue. Also, patient reports that during that same time he experience right parietal and right occipital intense headaches. Patient questions right upper ear scab x3 weeks and if he should see dermatologist. Patient reports that his thyroid medicine was double three weeks ago to 50 mcg. Patient reports that he was last seen in the lymphedema clinic on 4/20 but, continues the exercises he learned. Patient denies painful or difficulty swallowing. Reported all findings to Dr. Basilio Cairo.

## 2012-08-08 ENCOUNTER — Telehealth: Payer: Self-pay | Admitting: *Deleted

## 2012-08-08 NOTE — Telephone Encounter (Signed)
CALLED PATIENT TO INFORM OF TEST, LVM FOR A RETURN CALL 

## 2012-08-12 ENCOUNTER — Ambulatory Visit (HOSPITAL_COMMUNITY)
Admission: RE | Admit: 2012-08-12 | Discharge: 2012-08-12 | Disposition: A | Discharge status: Home / Self Care | Payer: BC Managed Care – PPO | Source: Ambulatory Visit | Attending: Radiation Oncology | Admitting: Radiation Oncology

## 2012-08-12 DIAGNOSIS — C779 Secondary and unspecified malignant neoplasm of lymph node, unspecified: Secondary | ICD-10-CM | POA: Insufficient documentation

## 2012-08-12 DIAGNOSIS — C4491 Basal cell carcinoma of skin, unspecified: Secondary | ICD-10-CM | POA: Insufficient documentation

## 2012-08-12 DIAGNOSIS — C50919 Malignant neoplasm of unspecified site of unspecified female breast: Secondary | ICD-10-CM | POA: Insufficient documentation

## 2012-08-12 DIAGNOSIS — Z923 Personal history of irradiation: Secondary | ICD-10-CM | POA: Insufficient documentation

## 2012-08-12 DIAGNOSIS — G529 Cranial nerve disorder, unspecified: Secondary | ICD-10-CM

## 2012-08-12 DIAGNOSIS — C4441 Basal cell carcinoma of skin of scalp and neck: Secondary | ICD-10-CM

## 2012-08-12 MED ORDER — GADOBENATE DIMEGLUMINE 529 MG/ML IV SOLN
20.0000 mL | Freq: Once | INTRAVENOUS | Status: AC | PRN
  Administered 2012-08-12: 20 mL via INTRAVENOUS

## 2012-08-15 ENCOUNTER — Encounter: Payer: Self-pay | Admitting: Radiation Oncology

## 2012-08-15 NOTE — Progress Notes (Signed)
Left VM message re: MRI results w/ callback #. Will present at tumor board.   ________________________________  Lonie Peak, MD

## 2012-08-17 ENCOUNTER — Other Ambulatory Visit (INDEPENDENT_AMBULATORY_CARE_PROVIDER_SITE_OTHER): Payer: Self-pay | Admitting: Radiation Oncology

## 2012-08-17 DIAGNOSIS — C4441 Basal cell carcinoma of skin of scalp and neck: Secondary | ICD-10-CM

## 2012-08-24 ENCOUNTER — Other Ambulatory Visit: Payer: Self-pay

## 2012-08-30 ENCOUNTER — Other Ambulatory Visit: Payer: Self-pay | Admitting: Radiation Oncology

## 2012-08-31 ENCOUNTER — Ambulatory Visit: Payer: Self-pay | Admitting: Family Medicine

## 2012-09-02 ENCOUNTER — Other Ambulatory Visit: Payer: Self-pay | Admitting: Family Medicine

## 2012-09-05 ENCOUNTER — Telehealth: Payer: Self-pay | Admitting: *Deleted

## 2012-09-05 ENCOUNTER — Other Ambulatory Visit: Payer: Self-pay | Admitting: Radiation Oncology

## 2012-09-05 DIAGNOSIS — C4441 Basal cell carcinoma of skin of scalp and neck: Secondary | ICD-10-CM

## 2012-09-05 NOTE — Telephone Encounter (Signed)
CALLED PATIENT TO INFORM OF LAB, AND TEST, SPOKE WITH PATIENT AND HE IS AWARE OF THESE APPTS. 

## 2012-09-12 ENCOUNTER — Ambulatory Visit
Admission: RE | Admit: 2012-09-12 | Discharge: 2012-09-12 | Disposition: A | Discharge status: Home / Self Care | Payer: BC Managed Care – PPO | Source: Ambulatory Visit | Attending: Radiation Oncology | Admitting: Radiation Oncology

## 2012-09-12 DIAGNOSIS — C4441 Basal cell carcinoma of skin of scalp and neck: Secondary | ICD-10-CM

## 2012-09-12 LAB — BUN AND CREATININE (CC13): Creatinine: 1 mg/dL (ref 0.7–1.3)

## 2012-09-13 ENCOUNTER — Ambulatory Visit (HOSPITAL_COMMUNITY)
Admission: RE | Admit: 2012-09-13 | Discharge: 2012-09-13 | Disposition: A | Discharge status: Home / Self Care | Payer: BC Managed Care – PPO | Source: Ambulatory Visit | Attending: Radiation Oncology | Admitting: Radiation Oncology

## 2012-09-13 ENCOUNTER — Encounter (HOSPITAL_COMMUNITY): Payer: Self-pay

## 2012-09-13 DIAGNOSIS — Z923 Personal history of irradiation: Secondary | ICD-10-CM | POA: Insufficient documentation

## 2012-09-13 DIAGNOSIS — C770 Secondary and unspecified malignant neoplasm of lymph nodes of head, face and neck: Secondary | ICD-10-CM | POA: Insufficient documentation

## 2012-09-13 DIAGNOSIS — C4441 Basal cell carcinoma of skin of scalp and neck: Secondary | ICD-10-CM

## 2012-09-13 MED ORDER — IOHEXOL 300 MG/ML  SOLN
100.0000 mL | Freq: Once | INTRAMUSCULAR | Status: AC | PRN
  Administered 2012-09-13: 100 mL via INTRAVENOUS

## 2012-09-14 ENCOUNTER — Telehealth: Payer: Self-pay

## 2012-09-14 DIAGNOSIS — Z0000 Encounter for general adult medical examination without abnormal findings: Secondary | ICD-10-CM

## 2012-09-14 NOTE — Telephone Encounter (Signed)
Patient informed and states he will come in sometime this week for his labs.  Labs ordered

## 2012-09-14 NOTE — Telephone Encounter (Signed)
We received paperwork from [facility] stating that pt shouldn't start Metformin until contacting MD first.   Per MD pt needs to get BUN and Creatine checked.   Left a message for pt to return my call

## 2012-09-15 ENCOUNTER — Telehealth: Payer: Self-pay | Admitting: Family Medicine

## 2012-09-15 ENCOUNTER — Telehealth: Payer: Self-pay

## 2012-09-15 LAB — BUN: BUN: 15 mg/dL (ref 6–23)

## 2012-09-15 MED ORDER — SYRINGE (DISPOSABLE) 3 ML MISC: Status: DC

## 2012-09-15 MED ORDER — "INSULIN SYRINGE-NEEDLE U-100 31G X 5/16"" 0.3 ML MISC": Status: DC

## 2012-09-15 NOTE — Telephone Encounter (Signed)
I spoke to patient and he stated he needs 31G X 5/16.  RX sent to pharmacy

## 2012-09-15 NOTE — Telephone Encounter (Signed)
Pt needs syringes 

## 2012-09-16 ENCOUNTER — Other Ambulatory Visit: Payer: Self-pay | Admitting: *Deleted

## 2012-09-16 ENCOUNTER — Encounter: Payer: Self-pay | Admitting: Radiation Oncology

## 2012-09-16 MED ORDER — INSULIN PEN NEEDLE 31G X 5 MM MISC: Status: DC

## 2012-09-16 NOTE — Progress Notes (Signed)
Left VM for patient on cell phone informing him that Dr. Jearld Fenton and I spoke, and his office will be contacting patient for biopsy plans of the submandibular lesion in his neck.  -----------------------------------  Lonie Peak, MD

## 2012-09-16 NOTE — Progress Notes (Signed)
Per [facility] request for pen needle refill/SLS

## 2012-09-16 NOTE — Telephone Encounter (Signed)
Quick Note:  Patient Informed and voiced understanding ______ 

## 2012-09-27 ENCOUNTER — Other Ambulatory Visit (HOSPITAL_COMMUNITY): Payer: Self-pay | Admitting: Otolaryngology

## 2012-09-27 DIAGNOSIS — R599 Enlarged lymph nodes, unspecified: Secondary | ICD-10-CM

## 2012-09-28 ENCOUNTER — Other Ambulatory Visit: Payer: Self-pay | Admitting: Radiology

## 2012-09-28 ENCOUNTER — Telehealth: Payer: Self-pay | Admitting: Family Medicine

## 2012-09-28 MED ORDER — RANITIDINE HCL 300 MG PO TABS: 300.0000 mg | ORAL_TABLET | Freq: Every day | ORAL | Status: DC

## 2012-09-28 MED ORDER — LOSARTAN POTASSIUM 25 MG PO TABS: 25.0000 mg | ORAL_TABLET | Freq: Every day | ORAL | Status: DC

## 2012-09-28 NOTE — Telephone Encounter (Signed)
Refill- ranitidine 300mg  tablet. Take one tablet at bedtime. Qty 30 last fill 5.31.14  Refill- losartan potassium 25mg  tab. Take one tablet every day. Qty 30 last fill 5.31.14

## 2012-10-03 ENCOUNTER — Other Ambulatory Visit (HOSPITAL_COMMUNITY): Payer: Self-pay

## 2012-10-03 ENCOUNTER — Telehealth: Payer: Self-pay | Admitting: Family Medicine

## 2012-10-03 MED ORDER — INSULIN PEN NEEDLE 31G X 5 MM MISC: Status: DC

## 2012-10-03 NOTE — Telephone Encounter (Signed)
Refill- bd ultra fine pen ndl 58mmx31g. Use twice daily as directed with lantus. Qty 100 last fill 6.10.14

## 2012-10-03 NOTE — Telephone Encounter (Signed)
Rx request to pharmacy/SLS  

## 2012-10-04 ENCOUNTER — Ambulatory Visit (HOSPITAL_COMMUNITY): Payer: BC Managed Care – PPO

## 2012-10-04 ENCOUNTER — Other Ambulatory Visit: Payer: Self-pay | Admitting: Radiology

## 2012-10-06 ENCOUNTER — Encounter (HOSPITAL_COMMUNITY): Payer: Self-pay | Admitting: Pharmacy Technician

## 2012-10-07 ENCOUNTER — Ambulatory Visit (HOSPITAL_COMMUNITY)
Admission: RE | Admit: 2012-10-07 | Discharge: 2012-10-07 | Disposition: A | Discharge status: Home / Self Care | Payer: BC Managed Care – PPO | Source: Ambulatory Visit | Attending: Otolaryngology | Admitting: Otolaryngology

## 2012-10-07 ENCOUNTER — Other Ambulatory Visit (HOSPITAL_COMMUNITY): Payer: Self-pay | Admitting: Otolaryngology

## 2012-10-07 DIAGNOSIS — R599 Enlarged lymph nodes, unspecified: Secondary | ICD-10-CM

## 2012-10-07 DIAGNOSIS — Z85828 Personal history of other malignant neoplasm of skin: Secondary | ICD-10-CM | POA: Insufficient documentation

## 2012-11-08 ENCOUNTER — Telehealth: Payer: Self-pay | Admitting: Family Medicine

## 2012-11-08 MED ORDER — GLUCOSE BLOOD VI STRP: ORAL_STRIP | Status: DC

## 2012-11-08 NOTE — Telephone Encounter (Signed)
Refill- accu chek aviva plus test strp. Use as instructed. Qty 100 last fill 4.16.14

## 2012-11-26 ENCOUNTER — Other Ambulatory Visit: Payer: Self-pay | Admitting: Family Medicine

## 2013-01-03 ENCOUNTER — Other Ambulatory Visit: Payer: Self-pay | Admitting: Family Medicine

## 2013-01-03 DIAGNOSIS — Z0000 Encounter for general adult medical examination without abnormal findings: Secondary | ICD-10-CM

## 2013-01-03 DIAGNOSIS — E1110 Type 2 diabetes mellitus with ketoacidosis without coma: Secondary | ICD-10-CM

## 2013-01-03 DIAGNOSIS — E039 Hypothyroidism, unspecified: Secondary | ICD-10-CM

## 2013-01-03 DIAGNOSIS — E782 Mixed hyperlipidemia: Secondary | ICD-10-CM

## 2013-01-03 DIAGNOSIS — I10 Essential (primary) hypertension: Secondary | ICD-10-CM

## 2013-01-03 NOTE — Telephone Encounter (Signed)
Accuchek aviva plus test strips sent to pharmacy. Pt was due for follow up in February and has no appts on file.  Please call pt to arrange appt.

## 2013-01-03 NOTE — Telephone Encounter (Signed)
Left message for patient to return my call.

## 2013-01-04 NOTE — Telephone Encounter (Signed)
Follow up appointment scheduled for 01/19/13.   Patient states that he needs labs prior. Neysa Bonito, can you check on this? Thanks!

## 2013-01-05 NOTE — Telephone Encounter (Signed)
Labs hgba1c, cbc, tsh, hepatic, renal, tsh, freeT4, cbc, lipid

## 2013-01-05 NOTE — Telephone Encounter (Signed)
Please advise labs and diagnosis? I don't see on last AVS

## 2013-01-05 NOTE — Telephone Encounter (Signed)
And PSA< for annual, diabetes, hyperlipid, HTN

## 2013-01-05 NOTE — Telephone Encounter (Signed)
Lab orders placed and detailed message left on patients vm

## 2013-01-16 ENCOUNTER — Telehealth: Payer: Self-pay

## 2013-01-16 DIAGNOSIS — E039 Hypothyroidism, unspecified: Secondary | ICD-10-CM

## 2013-01-16 DIAGNOSIS — E1110 Type 2 diabetes mellitus with ketoacidosis without coma: Secondary | ICD-10-CM

## 2013-01-16 DIAGNOSIS — E782 Mixed hyperlipidemia: Secondary | ICD-10-CM

## 2013-01-16 DIAGNOSIS — I10 Essential (primary) hypertension: Secondary | ICD-10-CM

## 2013-01-16 NOTE — Telephone Encounter (Signed)
Patient came in for a blood draw today. Please advise which labs and diagnosis needs to be done?

## 2013-01-16 NOTE — Telephone Encounter (Signed)
Needs hgba1c, lipid, renal, cbc, tsh, free T4, hepatic for HTN, DM, hypothyroid

## 2013-01-17 LAB — LIPID PANEL: LDL Cholesterol: 24 mg/dL (ref 0–99)

## 2013-01-17 LAB — HEPATIC FUNCTION PANEL
ALT: 19 U/L (ref 0–53)
Bilirubin, Direct: 0.1 mg/dL (ref 0.0–0.3)
Indirect Bilirubin: 0.4 mg/dL (ref 0.0–0.9)
Total Bilirubin: 0.5 mg/dL (ref 0.3–1.2)
Total Protein: 5.9 g/dL — ABNORMAL LOW (ref 6.0–8.3)

## 2013-01-17 LAB — RENAL FUNCTION PANEL
CO2: 26 mEq/L (ref 19–32)
Calcium: 8.9 mg/dL (ref 8.4–10.5)
Glucose, Bld: 181 mg/dL — ABNORMAL HIGH (ref 70–99)
Potassium: 4.3 mEq/L (ref 3.5–5.3)
Sodium: 140 mEq/L (ref 135–145)

## 2013-01-17 LAB — HEMOGLOBIN A1C
Hgb A1c MFr Bld: 8.3 % — ABNORMAL HIGH (ref <5.7)
Mean Plasma Glucose: 192 mg/dL — ABNORMAL HIGH (ref <117)

## 2013-01-17 LAB — CBC
Hemoglobin: 14.3 g/dL (ref 13.0–17.0)
MCH: 29.7 pg (ref 26.0–34.0)
RBC: 4.82 MIL/uL (ref 4.22–5.81)
WBC: 3 10*3/uL — ABNORMAL LOW (ref 4.0–10.5)

## 2013-01-17 LAB — TSH: TSH: 4.703 u[IU]/mL — ABNORMAL HIGH (ref 0.350–4.500)

## 2013-01-17 LAB — T4, FREE: Free T4: 1.01 ng/dL (ref 0.80–1.80)

## 2013-01-19 ENCOUNTER — Telehealth: Payer: Self-pay | Admitting: Family Medicine

## 2013-01-19 ENCOUNTER — Encounter: Payer: Self-pay | Admitting: Family Medicine

## 2013-01-19 ENCOUNTER — Ambulatory Visit (INDEPENDENT_AMBULATORY_CARE_PROVIDER_SITE_OTHER): Payer: BC Managed Care – PPO | Admitting: Family Medicine

## 2013-01-19 VITALS — BP 138/88 | HR 90 | Temp 98.1°F | Ht 78.0 in | Wt 301.1 lb

## 2013-01-19 DIAGNOSIS — E1110 Type 2 diabetes mellitus with ketoacidosis without coma: Secondary | ICD-10-CM

## 2013-01-19 DIAGNOSIS — I10 Essential (primary) hypertension: Secondary | ICD-10-CM

## 2013-01-19 DIAGNOSIS — E039 Hypothyroidism, unspecified: Secondary | ICD-10-CM

## 2013-01-19 DIAGNOSIS — Z0000 Encounter for general adult medical examination without abnormal findings: Secondary | ICD-10-CM

## 2013-01-19 DIAGNOSIS — Z23 Encounter for immunization: Secondary | ICD-10-CM

## 2013-01-19 DIAGNOSIS — E782 Mixed hyperlipidemia: Secondary | ICD-10-CM

## 2013-01-19 DIAGNOSIS — E1310 Other specified diabetes mellitus with ketoacidosis without coma: Secondary | ICD-10-CM

## 2013-01-19 DIAGNOSIS — E119 Type 2 diabetes mellitus without complications: Secondary | ICD-10-CM

## 2013-01-19 MED ORDER — LEVOTHYROXINE SODIUM 75 MCG PO TABS: 75.0000 ug | ORAL_TABLET | Freq: Every day | ORAL | Status: DC

## 2013-01-19 MED ORDER — LOSARTAN POTASSIUM 50 MG PO TABS: 50.0000 mg | ORAL_TABLET | Freq: Every day | ORAL | Status: DC

## 2013-01-19 MED ORDER — METFORMIN HCL 500 MG PO TABS: ORAL_TABLET | ORAL | Status: DC

## 2013-01-19 NOTE — Progress Notes (Signed)
Patient ID: Steven Ibarra, male   DOB: 01-May-1955, 57 y.o.   MRN: [id number] Steven Ibarra [id number] 1956-01-04 01/19/2013      Progress Note-Follow Up  Subjective  Chief Complaint  Chief Complaint  Patient presents with  . Follow-up  . Injections    Prevnar and flu    HPI  Patient is a 57 we'll Caucasian male who is in today for followup. Generally feeling well. Denies recent illness. He acknowledges his blood sugars are running somewhat high. Typically in the 150s to 170s before eating and several times he's been over 200 after eating. Denies any recent illness. Denies polyuria and polydipsia. No chest pain, palpitations, shortness of breath, GI or GU complaints. He is using medications as prescribed.  Past Medical History  Diagnosis Date  . Hyperlipidemia   . Obesity   . Overweight(278.02) 10/23/2009  . Mixed hyperlipidemia 10/23/2009  . ELEVATED BLOOD PRESSURE 04/23/2010  . DYSPNEA ON EXERTION 10/23/2009  . ALLERGIC RHINITIS, SEASONAL 10/23/2009  . OSA (obstructive sleep apnea)   . SLEEP APNEA, OBSTRUCTIVE 10/23/2009    Sleep study Done at [facility]  . Hearing loss   . Hypertension     Does not see a cardiologist, has not had a stress, echo   . GERD (gastroesophageal reflux disease)   . Diabetes mellitus approx 2003    type 2  . DIABETES MELLITUS, TYPE II 10/23/2009  . S/P radiation therapy 07/06/11 - 08/19/11    Right Facial and Right Neck Nodes and right Trigeminal  Nerve to Base of Skull/ Total Dose 6600 cGy/ 33 Fractions  . Preventative health care 09/30/2011  . Hypothyroid 01/28/2012  . Otitis externa of right ear 07/06/2012  . Pulled muscle 07/06/12  . Cancer   . Skin cancer     on nose, 9-10 yrs ago  . Basal cell carcinoma of neck 05/28/2011    r suprahyoid, radical neck dissection S/p 6 weeks of targeted radiation therapy     Past Surgical History  Procedure Laterality Date  . Tonsillectomy and adenoidectomy    . Skin cancer removal    . Radical neck dissection   05/28/2011    Procedure: RADICAL NECK DISSECTION;  Surgeon: Suzanna Obey, MD;  Location: [facility] OR;  Service: ENT;  Laterality: N/A;  Suprahyoid Neck Dissection    Family History  Problem Relation Age of Onset  . Other Mother     CHF  . Stroke Father   . Hyperlipidemia Father   . Heart disease Father     s/p valve replacement  . Hyperlipidemia Brother   . Obesity Brother   . Other Brother     Back pain  . Hyperlipidemia Brother   . Hypertension Brother   . Other Brother     Panic attacks  . Hyperlipidemia Brother   . Anesthesia problems Neg Hx     History   Social History  . Marital Status: Married    Spouse Name: N/A    Number of Children: N/A  . Years of Education: N/A   Occupational History  . Not on file.   Social History Main Topics  . Smoking status: Former Smoker -- 1.00 packs/day for 9 years    Types: Cigarettes    Quit date: 03/31/1975  . Smokeless tobacco: Never Used  . Alcohol Use: No     Comment: 4 beers a month, 06/12/11 rarely uses now  . Drug Use: No  . Sexual Activity: Yes   Other Topics Concern  .  Not on file   Social History Narrative   Patient is married.   Patient with a history of smoking one pack per day for approximately 29 years from ages of [age]. Patient denies ever having used smokeless tobacco. Patient with rare use of alcohol.      Mother died at age 64 from congestive heart failure complications. Father died at the age of 46 secondary to a stroke.    Current Outpatient Prescriptions on File Prior to Visit  Medication Sig Dispense Refill  . ACCU-CHEK AVIVA PLUS test strip USE AS INSTRUCTED  100 each  1  . Ascorbic Acid (VITAMIN C) 1000 MG tablet Take 1,000 mg by mouth daily.      . cetirizine (ZYRTEC) 10 MG tablet Take 10 mg by mouth daily as needed for allergies.       . fenofibrate 160 MG tablet Take 1 tablet (160 mg total) by mouth daily.  90 tablet  2  . ibuprofen (ADVIL,MOTRIN) 200 MG tablet Take 600 mg by mouth every 6 (six)  hours as needed for pain.       Marland Kitchen insulin lispro (HUMALOG) 100 UNIT/ML injection Inject 4-10 Units into the skin daily as needed for high blood sugar (Test daily after dinner and see sliding scale for blood sugar >200.).      Marland Kitchen Insulin Pen Needle (B-D UF III MINI PEN NEEDLES) 31G X 5 MM MISC USE TWICE DAILY AS DIRECTED WITH LANTUS DX: 250.0  100 each  2  . Insulin Syringe-Needle U-100 (B-D INSULIN SYRINGE) 31G X 5/16" 0.3 ML MISC Use bid as directed  DX 250.00  100 each  1  . Lancets (ACCU-CHEK MULTICLIX) lancets USE AS DIRECTED  102 each  0  . LANTUS SOLOSTAR 100 UNIT/ML SOPN INJECT 100 UNITS IN THE MORNING AND 50 UNITS IN THE EVENING  45 pen  3  . Misc Natural Products (OSTEO BI-FLEX TRIPLE STRENGTH PO) Take 2 tablets by mouth daily.      . NON FORMULARY 1 each by Other route 4 (four) times daily. Accu-check multiclix lancets- use as directed      . ranitidine (ZANTAC) 300 MG tablet Take 1 tablet (300 mg total) by mouth at bedtime.  30 tablet  3  . rosuvastatin (CRESTOR) 20 MG tablet Take 1 tablet (20 mg total) by mouth daily.  30 tablet  3  . Syringe, Disposable, 3 ML MISC DX 250.0  Use bid as directed  100 each  2   No current facility-administered medications on file prior to visit.    No Known Allergies  Review of Systems  Review of Systems  Constitutional: Negative for fever and malaise/fatigue.  HENT: Negative for congestion.   Eyes: Negative for discharge.  Respiratory: Negative for shortness of breath.   Cardiovascular: Negative for chest pain, palpitations and leg swelling.  Gastrointestinal: Negative for nausea, abdominal pain and diarrhea.  Genitourinary: Negative for dysuria.  Musculoskeletal: Negative for falls.  Skin: Negative for rash.  Neurological: Negative for loss of consciousness and headaches.  Endo/Heme/Allergies: Negative for polydipsia.  Psychiatric/Behavioral: Negative for depression and suicidal ideas. The patient is not nervous/anxious and does not have  insomnia.     Objective  BP 138/88  Pulse 90  Temp(Src) 98.1 F (36.7 C) (Oral)  Ht 6\' 6"  (1.981 m)  Wt 301 lb 1.9 oz (136.587 kg)  BMI 34.8 kg/m2  SpO2 94%  Physical Exam  Lab Results  Component Value Date   TSH 4.703* 01/16/2013  Lab Results  Component Value Date   WBC 3.0* 01/16/2013   HGB 14.3 01/16/2013   HCT 41.1 01/16/2013   MCV 85.3 01/16/2013   PLT 168 01/16/2013   Lab Results  Component Value Date   CREATININE 1.00 01/16/2013   BUN 16 01/16/2013   NA 140 01/16/2013   K 4.3 01/16/2013   CL 104 01/16/2013   CO2 26 01/16/2013   Lab Results  Component Value Date   ALT 19 01/16/2013   AST 15 01/16/2013   ALKPHOS 44 01/16/2013   BILITOT 0.5 01/16/2013   Lab Results  Component Value Date   CHOL 110 01/16/2013   Lab Results  Component Value Date   HDL 23* 01/16/2013   Lab Results  Component Value Date   LDLCALC 24 01/16/2013   Lab Results  Component Value Date   TRIG 313* 01/16/2013   Lab Results  Component Value Date   CHOLHDL 4.8 01/16/2013    Physical Exam  Constitutional: He is oriented to person, place, and time and well-developed, well-nourished, and in no distress. No distress.  HENT:  Head: Normocephalic and atraumatic.  Eyes: Conjunctivae are normal.  Neck: Neck supple. No thyromegaly present.  Cardiovascular: Normal rate, regular rhythm and normal heart sounds.   No murmur heard. Pulmonary/Chest: Effort normal and breath sounds normal. No respiratory distress.  Abdominal: He exhibits no distension and no mass. There is no tenderness.  Musculoskeletal: He exhibits no edema.  Neurological: He is alert and oriented to person, place, and time.  Skin: Skin is warm.  Psychiatric: Memory, affect and judgment normal.   Assessment & Plan  Hypertension Will increase Losartan to 50 mg daily due to higher numbers at home. Encouraged DASH diet.  Hypothyroid Raised Synthroid to 75 mcg daily  MIXED HYPERLIPIDEMIA Tolerating  Crestor. Avoid trans fats and simple carbs. Add krill oil caps  DM (diabetes mellitus) type 2, uncontrolled, with ketoacidosis Avoid simple carbs, increase Lantus by 2 units, check sugar tid and prn due ot poor control

## 2013-01-19 NOTE — Patient Instructions (Signed)

## 2013-01-19 NOTE — Telephone Encounter (Signed)
Lab order week of 05-06-2013 Labs prior to visit, lipid, renal, cbc, tsh, hepatic, hgba1c, PSA

## 2013-01-22 NOTE — Assessment & Plan Note (Signed)
Will increase Losartan to 50 mg daily due to higher numbers at home. Encouraged DASH diet.

## 2013-01-22 NOTE — Assessment & Plan Note (Signed)
Avoid simple carbs, increase Lantus by 2 units, check sugar tid and prn due ot poor control

## 2013-01-22 NOTE — Assessment & Plan Note (Signed)
Tolerating Crestor. Avoid trans fats and simple carbs. Add krill oil caps

## 2013-01-22 NOTE — Assessment & Plan Note (Signed)
Raised Synthroid to 75 mcg daily

## 2013-01-27 ENCOUNTER — Telehealth: Payer: Self-pay | Admitting: *Deleted

## 2013-01-27 ENCOUNTER — Ambulatory Visit
Admission: RE | Admit: 2013-01-27 | Payer: BC Managed Care – PPO | Source: Ambulatory Visit | Admitting: Radiation Oncology

## 2013-01-27 NOTE — Telephone Encounter (Signed)
RECEIVED PHONE CALL FROM THIS PATIENT STATING THAT HE COULD NOT MAKE HIS FU VISIT TODAY BECAUSE IT INTERFERED WITH HIS JOB, I SPOKE WITH DR. Basilio Cairo AND ASKED ABOUT RESCHEDULING THIS APPT. SHE ASKED ME TO PUT HIM ON FOR Tuesday NOV. 4 , I SPOKE WITH THIS PATIENT AND HE WILL BE HERE AT 8:20 AM , I ALSO LET HIM KNOW THAT SHE IS EXPECTING AND THAT HIS APPT. WITH HER MAY BE CANCELLED IN VIEW OF THE FACT THAT SHE MAY HAVE TO GO TO THE HOSPITAL, IN THAT EVENT THE APPT. WOULD BE RESCHEDULED WITH ONE OF THE OTHER PHYSICIANS, PATIENT UNDERSTOOD THIS AND IS IN AGREEMENT WITH THIS.

## 2013-01-31 ENCOUNTER — Telehealth: Payer: Self-pay

## 2013-01-31 ENCOUNTER — Ambulatory Visit
Admission: RE | Admit: 2013-01-31 | Discharge: 2013-01-31 | Disposition: A | Discharge status: Home / Self Care | Payer: BC Managed Care – PPO | Source: Ambulatory Visit | Attending: Radiation Oncology | Admitting: Radiation Oncology

## 2013-01-31 ENCOUNTER — Encounter: Payer: Self-pay | Admitting: Radiation Oncology

## 2013-01-31 ENCOUNTER — Ambulatory Visit: Payer: Self-pay | Admitting: Radiation Oncology

## 2013-01-31 VITALS — BP 138/90 | HR 91 | Temp 97.7°F | Resp 16 | Wt 298.2 lb

## 2013-01-31 DIAGNOSIS — C4441 Basal cell carcinoma of skin of scalp and neck: Secondary | ICD-10-CM

## 2013-01-31 NOTE — Telephone Encounter (Signed)
Pt left a message requesting a copy of his labs.  Labs printed and mailed per request and pt informed

## 2013-01-31 NOTE — Progress Notes (Addendum)
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      Overall, he feels well.  He currently denies any facial sensory issues. He does continue to have lymphedema in his neck and he performs massages talk to him by physical therapy. He did receive a foam compression collar but has not been using this lately. He does have a dry mouth and uses Biotine for this.  After I saw him in May, an MRI of his face and trigeminal nerves was ordered in light of some facial symptoms. This was reviewed at our multidisciplinary tumor board, and it revealed an area of enhancement in the right submandibular region. Of note, right submandibular gland has previously  been removed.  Enhancement surrounding the third mandibular division of the right fifth cranial nerve was decreased in extent and intensity compared to March 2013. There remains some asymmetric enhancement at the right foramen ovale level. To further workup the right mandibular enhancement, he underwent a CT of the soft tissues of the neck with contrast on 09/13/2012.  This demonstrated a spiculated enhancing area measuring 9 x 9 x 15 mm in the right submandibular region. Ultrasound-guided biopsy was recommended. The patient saw Dr. Jearld Fenton in light of this.              US guided biopsy was scheduled in July, but per report,  Findings: Limited soft tissue ultrasound demonstrates no  significant discrete nodule, mass, or adenopathy to  correlate with the described CT and MRI findings. Therefore biopsy was not performed.  IMPRESSION:  No visualized right neck surgical site abnormality by ultrasound. Findings discussed with the patientRecommend continued interval surveillance imaging of the neck at 6-12 months with CT.   Denies pain at this time. Denies right ear pain at this time. Denies painful or difficult swallowing. Reports dry mouth continues despite using Orajel and biotene. Reports he does his lymphedema exercises daily. Anterior neck lymphedema greatly improved. Right sided facial weakness appears stable. Denies nausea, vomiting, headache or dizziness.   Dr Rogelia Rohrer recently Raised Synthroid to 75 mcg daily   ALLERGIES:  has No Known Allergies.  Meds: Current Outpatient Prescriptions  Medication Sig Dispense Refill  . ACCU-CHEK AVIVA PLUS test strip USE AS INSTRUCTED  100 each  1  . Ascorbic Acid (VITAMIN C) 1000 MG tablet Take 1,000 mg by mouth daily.      Marland Kitchen aspirin 81 MG tablet Take 81 mg by mouth daily.      . cetirizine (ZYRTEC) 10 MG tablet Take 10 mg by mouth daily as needed for allergies.       . fenofibrate 160 MG tablet Take 1 tablet (160 mg total) by mouth daily.  90 tablet  2  . ibuprofen (ADVIL,MOTRIN) 200 MG tablet Take 600 mg by mouth every 6 (six) hours as needed for pain.       Marland Kitchen insulin lispro (HUMALOG) 100 UNIT/ML injection Inject 4-10 Units into the skin daily as  needed for high blood sugar (Test daily after dinner and see sliding scale for blood sugar >200.).      Marland Kitchen Insulin Pen Needle (B-D UF III MINI PEN NEEDLES) 31G X 5 MM MISC USE TWICE DAILY AS DIRECTED WITH LANTUS DX: 250.0  100 each  2  . Insulin Syringe-Needle U-100 (B-D INSULIN SYRINGE) 31G X 5/16" 0.3 ML MISC Use bid as directed  DX 250.00  100 each  1  . Lancets (ACCU-CHEK MULTICLIX) lancets USE AS DIRECTED  102 each  0  . LANTUS SOLOSTAR 100 UNIT/ML SOPN INJECT 100 UNITS IN THE MORNING AND 50 UNITS IN THE EVENING  45 pen  3  .  levothyroxine (SYNTHROID, LEVOTHROID) 75 MCG tablet Take 1 tablet (75 mcg total) by mouth daily.  30 tablet  5  . losartan (COZAAR) 50 MG tablet Take 1 tablet (50 mg total) by mouth daily.  30 tablet  5  . metFORMIN (GLUCOPHAGE) 500 MG tablet 2 tabs po bid and 1 tab po q noon  150 tablet  5  . Misc Natural Products (OSTEO BI-FLEX TRIPLE STRENGTH PO) Take 2 tablets by mouth daily.      . NON FORMULARY 1 each by Other route 4 (four) times daily. Accu-check multiclix lancets- use as directed      . ranitidine (ZANTAC) 300 MG tablet Take 1 tablet (300 mg total) by mouth at bedtime.  30 tablet  3  . rosuvastatin (CRESTOR) 20 MG tablet Take 1 tablet (20 mg total) by mouth daily.  30 tablet  3  . Syringe, Disposable, 3 ML MISC DX 250.0  Use bid as directed  100 each  2   No current facility-administered medications for this encounter.    Physical Findings: The patient is in no acute distress. Patient is alert and oriented.  weight is 298 lb 3.2 oz (135.263 kg). His oral temperature is 97.7 F (36.5 C). His blood pressure is 138/90 and his pulse is 91. His respiration is 16 and oxygen saturation is 100%. . Slight droop of right mouth. Modest residual lymphedema of neck. No palpable lymphadenopathy in cervical or supra clavicular regions. No lesions in oral cavity or oropharynx. Mucous membranes appear relatively moist. No sensory deficits in face.   Lab Findings: Lab Results  Component Value Date   WBC 3.0* 01/16/2013   HGB 14.3 01/16/2013   HCT 41.1 01/16/2013   MCV 85.3 01/16/2013   PLT 168 01/16/2013    Radiographic Findings: As above  Impression/Plan:    1) Head and Neck Cancer Status: No evidence of disease  2) Nutritional Status: no active issues  3) Swallowing: No issues  4) Dental: Encouraged to continue regular followup with dentistry, and dental hygiene including fluoride rinses.   5) Energy: Dr Abner Greenspan recently Raised Synthroid to 75 mcg daily  6) Social: No active  social issues to address at this time  7) Other: Continue followup as scheduled with Dr. Jearld Fenton of otolaryngology-he reports that Dr. Jearld Fenton is going to order a CT scan in December with followup thereafter in his clinic.  8) Follow-up in April 2015. The patient was encouraged to call with any issues or questions before then.  9) Resume use of compression collar from PT for edema of neck.  I spent 20 minutes minutes face to face with the patient and more than 50% of that time was spent in counseling and/or coordination of care. _____________________________________   Lonie Peak, MD

## 2013-01-31 NOTE — Progress Notes (Signed)
Denies pain at this time. Denies right ear pain at this time. Denies painful or difficult swallowing. Reports dry mouth continues despite using Orajel and biotene. Reports he does his lymphedema exercises daily. Anterior neck lymphedema greatly improved. Right sided facial weakness appears stable. Denies nausea, vomiting, headache or dizziness. Synthroid dose increased to 75 mcg and bp medication increase to 50 mg.

## 2013-02-01 ENCOUNTER — Other Ambulatory Visit: Payer: Self-pay | Admitting: Family Medicine

## 2013-02-08 ENCOUNTER — Ambulatory Visit: Payer: Self-pay

## 2013-02-08 ENCOUNTER — Ambulatory Visit: Payer: Self-pay | Admitting: Hematology and Oncology

## 2013-02-10 ENCOUNTER — Ambulatory Visit: Payer: BC Managed Care – PPO | Admitting: Radiation Oncology

## 2013-02-20 ENCOUNTER — Other Ambulatory Visit: Payer: Self-pay | Admitting: Otolaryngology

## 2013-02-20 DIAGNOSIS — K118 Other diseases of salivary glands: Secondary | ICD-10-CM

## 2013-02-20 DIAGNOSIS — C770 Secondary and unspecified malignant neoplasm of lymph nodes of head, face and neck: Secondary | ICD-10-CM

## 2013-02-22 ENCOUNTER — Ambulatory Visit
Admission: RE | Admit: 2013-02-22 | Discharge: 2013-02-22 | Disposition: A | Discharge status: Home / Self Care | Payer: BC Managed Care – PPO | Source: Ambulatory Visit | Attending: Otolaryngology | Admitting: Otolaryngology

## 2013-02-22 DIAGNOSIS — K118 Other diseases of salivary glands: Secondary | ICD-10-CM

## 2013-02-22 DIAGNOSIS — C770 Secondary and unspecified malignant neoplasm of lymph nodes of head, face and neck: Secondary | ICD-10-CM

## 2013-02-22 MED ORDER — IOHEXOL 300 MG/ML  SOLN
75.0000 mL | Freq: Once | INTRAMUSCULAR | Status: AC | PRN
  Administered 2013-02-22: 75 mL via INTRAVENOUS

## 2013-02-24 ENCOUNTER — Other Ambulatory Visit: Payer: Self-pay | Admitting: Family Medicine

## 2013-02-24 NOTE — Telephone Encounter (Signed)
Rx request to pharmacy/SLS  

## 2013-02-24 NOTE — Telephone Encounter (Signed)
Rx request Denied-Last Rx confirmed by pharmacy 11.05.14, #[id number]-To Soon for request/SLS

## 2013-03-27 ENCOUNTER — Telehealth: Payer: Self-pay | Admitting: Family Medicine

## 2013-03-27 MED ORDER — INSULIN GLARGINE 100 UNIT/ML SOLOSTAR PEN: PEN_INJECTOR | SUBCUTANEOUS | Status: DC

## 2013-03-27 NOTE — Telephone Encounter (Signed)
refill-lantus solostar 100 units/ml. Inject 100 units in the morning and 50 units in the evening. Qty 45ml  last fill 12.3.14

## 2013-05-12 ENCOUNTER — Encounter: Payer: BC Managed Care – PPO | Admitting: Family Medicine

## 2013-05-19 ENCOUNTER — Telehealth: Payer: Self-pay | Admitting: *Deleted

## 2013-05-19 MED ORDER — ACCU-CHEK MULTICLIX LANCETS MISC: Status: DC

## 2013-05-19 NOTE — Telephone Encounter (Signed)
Rx request to pharmacy/SLS  

## 2013-06-09 ENCOUNTER — Telehealth: Payer: Self-pay | Admitting: Family Medicine

## 2013-06-09 MED ORDER — FENOFIBRATE 160 MG PO TABS: 160.0000 mg | ORAL_TABLET | Freq: Every day | ORAL | Status: DC

## 2013-06-09 NOTE — Telephone Encounter (Signed)
Refill-fenofibrate

## 2013-06-19 ENCOUNTER — Telehealth: Payer: Self-pay | Admitting: Family Medicine

## 2013-06-19 NOTE — Telephone Encounter (Signed)
Requesting refill on BD Ultra-fine Pen NDL 5MMX31G, Qty 100 with 2 refills, please call into [facility]

## 2013-06-20 MED ORDER — INSULIN PEN NEEDLE 31G X 5 MM MISC: Status: DC

## 2013-06-20 NOTE — Telephone Encounter (Signed)
Needles sent to [facility].

## 2013-06-30 ENCOUNTER — Other Ambulatory Visit: Payer: Self-pay | Admitting: Radiation Oncology

## 2013-06-30 ENCOUNTER — Encounter: Payer: Self-pay | Admitting: Radiation Oncology

## 2013-06-30 ENCOUNTER — Ambulatory Visit
Admission: RE | Admit: 2013-06-30 | Discharge: 2013-06-30 | Disposition: A | Discharge status: Home / Self Care | Payer: BC Managed Care – PPO | Source: Ambulatory Visit | Attending: Radiation Oncology | Admitting: Radiation Oncology

## 2013-06-30 VITALS — BP 137/87 | HR 83 | Temp 97.6°F | Ht 78.0 in | Wt 305.5 lb

## 2013-06-30 DIAGNOSIS — C4441 Basal cell carcinoma of skin of scalp and neck: Secondary | ICD-10-CM

## 2013-06-30 NOTE — Progress Notes (Signed)
Radiation Oncology         [phone] ________________________________  Name: Steven Ibarra MRN: [id number]  Date: 06/30/2013  DOB: 08/14/1955  Follow-Up Visit Note  CC: Penni Homans, MD  Mosie Lukes, MD  Diagnosis and Prior Radiotherapy:   Diagnosis and Prior Radiotherapy: Basal Cell Carcinoma, Metastatic to the Submandibular Lymph Node, Right Neck  Interval Since Last Radiation: Completed total dose of 66Gy in 33 fractions to the right facial nodes through the right neck and right trigeminal nerve to base of skull on 08-19-11  Narrative:  The patient returns today for routine follow-up  He denies any difficulty swallowing, nor pain. Eating whatever he wants, but he chews slowly and he is pleased that he can taste his food. He also notes that he sings better since surgery and treatment.   He reports that he will have a TSH within the next 2 months by Dr. Charlett Blake. TSH from was 4.703 on 01/16/13. He has gained 4 lbs since Oct. 2014.  No tingling, numbness, or facial pain.  Sees dentist regularly but could use fluoride trays more often. Biotene Gel helps dry mouth. Performs massages on neck as taught by PT.                             ALLERGIES:  has No Known Allergies.  Meds: Current Outpatient Prescriptions  Medication Sig Dispense Refill  . ACCU-CHEK AVIVA PLUS test strip USE AS INSTRUCTED  100 each  1  . Ascorbic Acid (VITAMIN C) 1000 MG tablet Take 1,000 mg by mouth daily.      . cetirizine (ZYRTEC) 10 MG tablet Take 10 mg by mouth daily as needed for allergies.       Marland Kitchen CRESTOR 20 MG tablet TAKE 1 TABLET (20 MG TOTAL) BY MOUTH DAILY.  30 tablet  3  . fenofibrate 160 MG tablet Take 1 tablet (160 mg total) by mouth daily.  90 tablet  0  . ibuprofen (ADVIL,MOTRIN) 200 MG tablet Take 600 mg by mouth every 6 (six) hours as needed for pain.       . Insulin Glargine (LANTUS SOLOSTAR) 100 UNIT/ML SOPN Inject 100 units qam and 50 units in the evening  45 pen  3  . insulin lispro  (HUMALOG) 100 UNIT/ML injection Inject 4-10 Units into the skin daily as needed for high blood sugar (Test daily after dinner and see sliding scale for blood sugar >200.).      Marland Kitchen Insulin Pen Needle (B-D UF III MINI PEN NEEDLES) 31G X 5 MM MISC USE TWICE DAILY AS DIRECTED WITH LANTUS DX: 250.0  100 each  2  . Insulin Syringe-Needle U-100 (B-D INSULIN SYRINGE) 31G X 5/16" 0.3 ML MISC Use bid as directed  DX 250.00  100 each  1  . Krill Oil 300 MG CAPS Take 1 capsule by mouth daily.      . Lancets (ACCU-CHEK MULTICLIX) lancets USE AS DIRECTED  102 each  1  . levothyroxine (SYNTHROID, LEVOTHROID) 75 MCG tablet Take 1 tablet (75 mcg total) by mouth daily.  30 tablet  5  . losartan (COZAAR) 50 MG tablet Take 1 tablet (50 mg total) by mouth daily.  30 tablet  5  . metFORMIN (GLUCOPHAGE) 500 MG tablet 2 tabs po bid and 1 tab po q noon  150 tablet  5  . Misc Natural Products (OSTEO BI-FLEX TRIPLE STRENGTH PO) Take 2 tablets by mouth daily.      Marland Kitchen  NON FORMULARY 1 each by Other route 4 (four) times daily. Accu-check multiclix lancets- use as directed      . ranitidine (ZANTAC) 300 MG tablet TAKE 1 TABLET (300 MG TOTAL) BY MOUTH AT BEDTIME.  30 tablet  3  . Syringe, Disposable, 3 ML MISC DX 250.0  Use bid as directed  100 each  2  . aspirin 81 MG tablet Take 81 mg by mouth daily.       No current facility-administered medications for this encounter.    Physical Findings: The patient is in no acute distress. Patient is alert and oriented.  height is 6\' 6"  (1.981 m) and weight is 305 lb 8 oz (138.574 kg). His temperature is 97.6 F (36.4 C). His blood pressure is 137/87 and his pulse is 83. Marland Kitchen No palpable lymphadenopathy in cervical or supraclavicular regions. No lesions in oral cavity or oropharynx. Mucous membranes appear relatively moist.    Lab Findings: Lab Results  Component Value Date   WBC 3.0* 01/16/2013   HGB 14.3 01/16/2013   HCT 41.1 01/16/2013   MCV 85.3 01/16/2013   PLT 168  01/16/2013    Lab Results  Component Value Date   TSH 4.703* 01/16/2013    Radiographic Findings: No results found.  Impression/Plan:    1) Head and Neck Cancer Status: No evidence of disease   2) Nutritional Status: no active issues   3) Swallowing: No issues   4) Dental: Encouraged to continue regular followup with dentistry, and dental hygiene including fluoride rinses.   5) Energy: Dr Charlett Blake following TSH and management of hypothyroidism  6) Social: No active social issues to address at this time  7) Follow-up in 6 months. The patient was encouraged to call with any issues or questions before then. Continue f/u with Dr Janace Hoard  _________________   [name], MD

## 2013-06-30 NOTE — Progress Notes (Addendum)
Steven Ibarra here today for assessment s/p radiation therapy to the submandibular lymph node and right neck(Metastatic Basal Call Carcinoma).  He denies any difficulty swallowing, nor pain.  Eating whatever he wants, but he chews slowly and he is pleased that he can taste his food.  He also notes that he sings better since surgery and treatment. Oral mucosa moist and intact.  He reports that he will have a TSH within the next 2 months by Dr. Charlett Blake.  TSH from was 4.703 on 01/16/13.  He has gained 4 lbs since 01/19/13.

## 2013-07-02 ENCOUNTER — Other Ambulatory Visit: Payer: Self-pay | Admitting: Family Medicine

## 2013-07-03 ENCOUNTER — Telehealth: Payer: Self-pay | Admitting: *Deleted

## 2013-07-03 NOTE — Telephone Encounter (Signed)
CALLED PATIENT TO INFORM OF FU VISIT FOR 12-29-13- @ 4:30 PM, SPOKE WITH PATIENT AND HE IS AWARE OF THIS APPT.

## 2013-07-07 ENCOUNTER — Telehealth: Payer: Self-pay | Admitting: Family Medicine

## 2013-07-07 NOTE — Telephone Encounter (Signed)
This was sent on 06/20/2013 with 2 refills

## 2013-07-07 NOTE — Telephone Encounter (Signed)
Refill- BD ultra-fine pen ndl 27mmx31g

## 2013-07-10 ENCOUNTER — Other Ambulatory Visit: Payer: Self-pay

## 2013-07-10 MED ORDER — INSULIN PEN NEEDLE 31G X 5 MM MISC: Status: DC

## 2013-08-01 ENCOUNTER — Telehealth: Payer: Self-pay | Admitting: Family Medicine

## 2013-08-01 ENCOUNTER — Other Ambulatory Visit: Payer: Self-pay | Admitting: Family Medicine

## 2013-08-01 DIAGNOSIS — E039 Hypothyroidism, unspecified: Secondary | ICD-10-CM

## 2013-08-01 DIAGNOSIS — I10 Essential (primary) hypertension: Secondary | ICD-10-CM

## 2013-08-01 MED ORDER — LOSARTAN POTASSIUM 50 MG PO TABS: 50.0000 mg | ORAL_TABLET | Freq: Every day | ORAL | Status: DC

## 2013-08-01 MED ORDER — LEVOTHYROXINE SODIUM 75 MCG PO TABS: 75.0000 ug | ORAL_TABLET | Freq: Every day | ORAL | Status: DC

## 2013-08-01 NOTE — Telephone Encounter (Signed)
Refill- losartan  Refill- levothyroxine

## 2013-08-22 ENCOUNTER — Other Ambulatory Visit: Payer: Self-pay

## 2013-08-22 MED ORDER — INSULIN LISPRO 100 UNIT/ML ~~LOC~~ SOLN: 4.0000 [IU] | Freq: Every day | SUBCUTANEOUS | Status: DC | PRN

## 2013-08-24 ENCOUNTER — Telehealth: Payer: Self-pay

## 2013-08-24 NOTE — Telephone Encounter (Signed)
Patient left a message stating that he needs his humalog refilled?  refaxed RX to pharmacy along with fax confirmation page from 08-22-13

## 2013-08-29 ENCOUNTER — Telehealth: Payer: Self-pay | Admitting: Family Medicine

## 2013-08-29 NOTE — Telephone Encounter (Signed)
Received PA paperwork for Humalog, forward to nurse

## 2013-09-01 ENCOUNTER — Other Ambulatory Visit: Payer: Self-pay | Admitting: Family Medicine

## 2013-09-01 ENCOUNTER — Telehealth: Payer: Self-pay | Admitting: Family Medicine

## 2013-09-01 DIAGNOSIS — I10 Essential (primary) hypertension: Secondary | ICD-10-CM

## 2013-09-01 MED ORDER — LOSARTAN POTASSIUM 50 MG PO TABS: 50.0000 mg | ORAL_TABLET | Freq: Every day | ORAL | Status: DC

## 2013-09-01 NOTE — Telephone Encounter (Signed)
Refill- losartan  [facility] [address] in [place]

## 2013-09-05 ENCOUNTER — Telehealth: Payer: Self-pay

## 2013-09-05 DIAGNOSIS — E039 Hypothyroidism, unspecified: Secondary | ICD-10-CM

## 2013-09-05 MED ORDER — LEVOTHYROXINE SODIUM 75 MCG PO TABS: 75.0000 ug | ORAL_TABLET | Freq: Every day | ORAL | Status: DC

## 2013-09-05 NOTE — Telephone Encounter (Signed)
Please call and inform pt that 1 month of Levothyroxine was sent to pharmacy but pt needs to schedule an appt for addt refills. md wanted to see him around 04-21-13

## 2013-09-06 NOTE — Telephone Encounter (Signed)
Informed patient of medication refill and he scheduled appointment for Friday morning.

## 2013-09-08 ENCOUNTER — Ambulatory Visit (INDEPENDENT_AMBULATORY_CARE_PROVIDER_SITE_OTHER): Payer: BC Managed Care – PPO | Admitting: Family Medicine

## 2013-09-08 ENCOUNTER — Other Ambulatory Visit: Payer: Self-pay

## 2013-09-08 ENCOUNTER — Encounter: Payer: Self-pay | Admitting: Family Medicine

## 2013-09-08 VITALS — BP 131/92 | HR 88 | Temp 98.5°F | Resp 18 | Ht 66.0 in | Wt 296.0 lb

## 2013-09-08 DIAGNOSIS — K219 Gastro-esophageal reflux disease without esophagitis: Secondary | ICD-10-CM

## 2013-09-08 DIAGNOSIS — E119 Type 2 diabetes mellitus without complications: Secondary | ICD-10-CM

## 2013-09-08 DIAGNOSIS — E782 Mixed hyperlipidemia: Secondary | ICD-10-CM

## 2013-09-08 DIAGNOSIS — C4441 Basal cell carcinoma of skin of scalp and neck: Secondary | ICD-10-CM

## 2013-09-08 DIAGNOSIS — E1310 Other specified diabetes mellitus with ketoacidosis without coma: Secondary | ICD-10-CM

## 2013-09-08 DIAGNOSIS — E785 Hyperlipidemia, unspecified: Secondary | ICD-10-CM

## 2013-09-08 DIAGNOSIS — B070 Plantar wart: Secondary | ICD-10-CM

## 2013-09-08 DIAGNOSIS — E1110 Type 2 diabetes mellitus with ketoacidosis without coma: Secondary | ICD-10-CM

## 2013-09-08 DIAGNOSIS — I10 Essential (primary) hypertension: Secondary | ICD-10-CM

## 2013-09-08 DIAGNOSIS — E663 Overweight: Secondary | ICD-10-CM

## 2013-09-08 LAB — CBC
HEMATOCRIT: 43.3 % (ref 39.0–52.0)
HEMOGLOBIN: 14.8 g/dL (ref 13.0–17.0)
MCH: 29 pg (ref 26.0–34.0)
MCHC: 34.2 g/dL (ref 30.0–36.0)
MCV: 84.7 fL (ref 78.0–100.0)
Platelets: 182 10*3/uL (ref 150–400)
RBC: 5.11 MIL/uL (ref 4.22–5.81)
RDW: 14.1 % (ref 11.5–15.5)
WBC: 5.5 10*3/uL (ref 4.0–10.5)

## 2013-09-08 MED ORDER — INSULIN LISPRO 100 UNIT/ML ~~LOC~~ SOLN: 4.0000 [IU] | Freq: Every day | SUBCUTANEOUS | Status: DC | PRN

## 2013-09-08 NOTE — Patient Instructions (Signed)
Plantar Wart Warts are benign (noncancerous) growths of the outer skin layer. They can occur at any time in life but are most common during childhood and the teen years. Warts can occur on many skin surfaces of the body. When they occur on the underside (sole) of your foot they are called plantar warts. They often emerge in groups with several small warts encircling a larger growth. CAUSES  Human papillomavirus (HPV) is the cause of plantar warts. HPV attacks a break in the skin of the foot. Walking barefoot can lead to exposure to the wart virus. Plantar warts tend to develop over areas of pressure such as the heel and ball of the foot. Plantar warts often grow into the deeper layers of skin. They may spread to other areas of the sole but cannot spread to other areas of the body. SYMPTOMS  You may also notice a growth on the undersurface of your foot. The wart may grow directly into the sole of the foot, or rise above the surface of the skin on the sole of the foot, or both. They are most often flat from pressure. Warts generally do not cause itching but may cause pain in the area of the wart when you put weight on your foot. DIAGNOSIS  Diagnosis is made by physical examination. This means your caregiver discovers it while examining your foot.  TREATMENT  There are many ways to treat plantar warts. However, warts are very tough. Sometimes it is difficult to treat them so that they go away completely and do not grow back. Any treatment must be done regularly to work. If left untreated, most plantar warts will eventually disappear over a period of one to two years. Treatments you can do at home include:  Putting duct tape over the top of the wart (occlusion), has been found to be effective over several months. The duct tape should be removed each night and reapplied until the wart has disappeared.  Placing over-the-counter medications on top of the wart to help kill the wart virus and remove the wart  tissue (salicylic acid, cantharidin, and dichloroacetic acid ) are useful. These are called keratolytic agents. These medications make the skin soft and gradually layers will shed away. Theses compounds are usually placed on the wart each night and then covered with a band-aid. They are also available in pre-medicated band-aid form. Avoid surrounding skin when applying these liquids as these medications can burn healthy skin. The treatment may take several months of nightly use to be effective.  Cryotherapy to freeze the wart has recently become available over-the-counter for children 4 years and older. This system makes use of a soft narrow applicator connected to a bottle of compressed cold liquid that is applied directly to the wart. This medication can burn health skin and should be used with caution.  As with all over-the-counter medications, read the directions carefully before use. Treatments generally done in your caregiver's office include:  Some aggressive treatments may cause discomfort, discoloration and scaring of the surrounding skin. The risks and benefits of treatment should be discussed with your caregiver.  Freezing the wart with liquid nitrogen (cryotherapy, see above).  Burning the wart with use of very high heat (cautery).  Injecting medication into the wart.  Surgically removing or laser treatment of the wart.  Your caregiver may refer you to a dermatologist for difficult to treat, large sized or large numbers of warts. HOME CARE INSTRUCTIONS   Soak the affected area in warm water. Dry   the area completely when you are done. Remove the top layer of softened skin, then apply the chosen topical medication and reapply a bandage.  Remove the bandage daily and file excess wart tissue (pumice stone works well for this purpose). Repeat the entire process daily or every other day for weeks until the plantar wart disappears.  Several brands of salicylic acid pads are available as  over-the-counter remedies.  Pain can be relieved by wearing a doughnut bandage. This is a bandage with a hole in it. The bandage is put on with the hole over the wart. This helps take the pressure off the wart and gives pain relief. To help prevent plantar warts:  Wear shoes and socks and change them daily.  Keep feet clean and dry.  Check your feet and your children's feet regularly.  Avoid direct contact with warts on other people.  Have growths, or changes on your skin checked by your caregiver. Document Released: 06/06/2003 Document Revised: 06/08/2011 Document Reviewed: 11/14/2008 [employer] Patient Information 2014 [employer].  

## 2013-09-08 NOTE — Progress Notes (Signed)
Pre visit review using our clinic review tool, if applicable. No additional management support is needed unless otherwise documented below in the visit note. 

## 2013-09-08 NOTE — Progress Notes (Signed)
Patient ID: Steven Ibarra, male   DOB: 26-Jan-1956, 58 y.o.   MRN: [id number] Steven Ibarra [id number] 1955-08-24 09/08/2013      Progress Note-Follow Up  Subjective  Chief Complaint  Chief Complaint  Patient presents with  . Follow-up  . Medication Refill    Humalog    HPI  Patient is a 58 year old male in today for routine medical care. Patient is in today for followup of possible has no other acute complaints. Her sugars have been somewhat better controlled. He has been following with ophthalmology. No polyuria or polydipsia. Denies CP/palp/SOB/HA/congestion/fevers/GI or GU c/o. Taking meds as prescribed  Past Medical History  Diagnosis Date  . Hyperlipidemia   . Obesity   . Overweight 10/23/2009  . Mixed hyperlipidemia 10/23/2009  . ELEVATED BLOOD PRESSURE 04/23/2010  . DYSPNEA ON EXERTION 10/23/2009  . ALLERGIC RHINITIS, SEASONAL 10/23/2009  . OSA (obstructive sleep apnea)   . SLEEP APNEA, OBSTRUCTIVE 10/23/2009    Sleep study Done at [facility]  . Hearing loss   . Hypertension     Does not see a cardiologist, has not had a stress, echo   . GERD (gastroesophageal reflux disease)   . Diabetes mellitus approx 2003    type 2  . DIABETES MELLITUS, TYPE II 10/23/2009  . S/P radiation therapy 07/06/11 - 08/19/11    Right Facial and Right Neck Nodes and right Trigeminal  Nerve to Base of Skull/ Total Dose 6600 cGy/ 33 Fractions  . Preventative health care 09/30/2011  . Hypothyroid 01/28/2012  . Otitis externa of right ear 07/06/2012  . Pulled muscle 07/06/12  . Cancer   . Skin cancer     on nose, 9-10 yrs ago  . Basal cell carcinoma of neck 05/28/2011    r suprahyoid, radical neck dissection S/p 6 weeks of targeted radiation therapy     Past Surgical History  Procedure Laterality Date  . Tonsillectomy and adenoidectomy    . Skin cancer removal    . Radical neck dissection  05/28/2011    Procedure: RADICAL NECK DISSECTION;  Surgeon: Melissa Montane, MD;  Location: [facility] OR;  Service:  ENT;  Laterality: N/A;  Suprahyoid Neck Dissection    Family History  Problem Relation Age of Onset  . Other Mother     CHF  . Stroke Father   . Hyperlipidemia Father   . Heart disease Father     s/p valve replacement  . Hyperlipidemia Brother   . Obesity Brother   . Other Brother     Back pain  . Hyperlipidemia Brother   . Hypertension Brother   . Other Brother     Panic attacks  . Hyperlipidemia Brother   . Anesthesia problems Neg Hx     History   Social History  . Marital Status: Married    Spouse Name: N/A    Number of Children: N/A  . Years of Education: N/A   Occupational History  . Not on file.   Social History Main Topics  . Smoking status: Former Smoker -- 1.00 packs/day for 9 years    Types: Cigarettes    Quit date: 03/31/1975  . Smokeless tobacco: Never Used  . Alcohol Use: No     Comment: 4 beers a month, 06/12/11 rarely uses now  . Drug Use: No  . Sexual Activity: Yes   Other Topics Concern  . Not on file   Social History Narrative   Patient is married.   Patient with a  history of smoking one pack per day for approximately 29 years from ages of [age]. Patient denies ever having used smokeless tobacco. Patient with rare use of alcohol.      Mother died at age 39 from congestive heart failure complications. Father died at the age of 55 secondary to a stroke.    Current Outpatient Prescriptions on File Prior to Visit  Medication Sig Dispense Refill  . ACCU-CHEK AVIVA PLUS test strip USE AS INSTRUCTED  100 each  1  . Ascorbic Acid (VITAMIN C) 1000 MG tablet Take 1,000 mg by mouth daily.      Marland Kitchen aspirin 81 MG tablet Take 81 mg by mouth daily.      . cetirizine (ZYRTEC) 10 MG tablet Take 10 mg by mouth daily as needed for allergies.       Marland Kitchen CRESTOR 20 MG tablet TAKE 1 TABLET BY MOUTH EVERY DAY  30 tablet  3  . ibuprofen (ADVIL,MOTRIN) 200 MG tablet Take 600 mg by mouth every 6 (six) hours as needed for pain.       Marland Kitchen insulin lispro (HUMALOG) 100  UNIT/ML injection Inject 0.04-0.1 mLs (4-10 Units total) into the skin daily as needed for high blood sugar (Test daily after dinner and see sliding scale for blood sugar >200.).  10 mL  2  . Insulin Pen Needle (B-D UF III MINI PEN NEEDLES) 31G X 5 MM MISC USE TWICE DAILY AS DIRECTED WITH LANTUS DX: 250.0  100 each  2  . Insulin Syringe-Needle U-100 (B-D INSULIN SYRINGE) 31G X 5/16" 0.3 ML MISC Use bid as directed  DX 250.00  100 each  1  . Krill Oil 300 MG CAPS Take 1 capsule by mouth daily.      . Lancets (ACCU-CHEK MULTICLIX) lancets USE AS DIRECTED  102 each  1  . LANTUS SOLOSTAR 100 UNIT/ML Solostar Pen INJECT 100 UNITS IN THE MORNING AND 50 UNITS IN THE EVENING  45 mL  3  . levothyroxine (SYNTHROID, LEVOTHROID) 75 MCG tablet Take 1 tablet (75 mcg total) by mouth daily.  30 tablet  0  . losartan (COZAAR) 50 MG tablet Take 1 tablet (50 mg total) by mouth daily.  30 tablet  2  . metFORMIN (GLUCOPHAGE) 500 MG tablet 2 tabs po bid and 1 tab po q noon  150 tablet  5  . Misc Natural Products (OSTEO BI-FLEX TRIPLE STRENGTH PO) Take 2 tablets by mouth daily.      . NON FORMULARY 1 each by Other route 4 (four) times daily. Accu-check multiclix lancets- use as directed      . ranitidine (ZANTAC) 300 MG tablet TAKE 1 TABLET BY MOUTH AT BEDTIME  30 tablet  3  . Syringe, Disposable, 3 ML MISC DX 250.0  Use bid as directed  100 each  2   No current facility-administered medications on file prior to visit.    No Known Allergies  Review of Systems  Review of Systems  Constitutional: Negative for fever and malaise/fatigue.  HENT: Negative for congestion.   Eyes: Negative for discharge.  Respiratory: Negative for shortness of breath.   Cardiovascular: Negative for chest pain, palpitations and leg swelling.  Gastrointestinal: Negative for nausea, abdominal pain and diarrhea.  Genitourinary: Negative for dysuria.  Musculoskeletal: Negative for falls.  Skin: Negative for rash.  Neurological:  Negative for loss of consciousness and headaches.  Endo/Heme/Allergies: Negative for polydipsia.  Psychiatric/Behavioral: Negative for depression and suicidal ideas. The patient is not nervous/anxious and does  not have insomnia.     Objective  BP 131/92  Pulse 88  Temp(Src) 98.5 F (36.9 C) (Oral)  Resp 18  Ht 5\' 6"  (1.676 m)  Wt 296 lb (134.265 kg)  BMI 47.80 kg/m2  SpO2 95%  Physical Exam  Physical Exam  Constitutional: He is oriented to person, place, and time and well-developed, well-nourished, and in no distress. No distress.  HENT:  Head: Normocephalic and atraumatic.  Eyes: Conjunctivae are normal.  Neck: Neck supple. No thyromegaly present.  Cardiovascular: Normal rate, regular rhythm and normal heart sounds.   No murmur heard. Pulmonary/Chest: Effort normal and breath sounds normal. No respiratory distress.  Abdominal: He exhibits no distension and no mass. There is no tenderness.  Musculoskeletal: He exhibits no edema.  Neurological: He is alert and oriented to person, place, and time.  Skin: Skin is warm.  Psychiatric: Memory, affect and judgment normal.    Lab Results  Component Value Date   TSH 4.703* 01/16/2013   Lab Results  Component Value Date   WBC 3.0* 01/16/2013   HGB 14.3 01/16/2013   HCT 41.1 01/16/2013   MCV 85.3 01/16/2013   PLT 168 01/16/2013   Lab Results  Component Value Date   CREATININE 1.00 01/16/2013   BUN 16 01/16/2013   NA 140 01/16/2013   K 4.3 01/16/2013   CL 104 01/16/2013   CO2 26 01/16/2013   Lab Results  Component Value Date   ALT 19 01/16/2013   AST 15 01/16/2013   ALKPHOS 44 01/16/2013   BILITOT 0.5 01/16/2013   Lab Results  Component Value Date   CHOL 110 01/16/2013   Lab Results  Component Value Date   HDL 23* 01/16/2013   Lab Results  Component Value Date   LDLCALC 24 01/16/2013   Lab Results  Component Value Date   TRIG 313* 01/16/2013   Lab Results  Component Value Date   CHOLHDL 4.8  01/16/2013     Assessment & Plan  Hypertension Well controlled, no changes to meds. Encouraged heart healthy diet such as the DASH diet and exercise as tolerated.   GERD (gastroesophageal reflux disease) Avoid offending foods, start probiotics. Do not eat large meals in late evening and consider raising head of bed.   Type II or unspecified type diabetes mellitus with unspecified complication, uncontrolled A1C stable but elevated. Minimize simple carbs. Given refill on Humalog. Follows with Opthamology, referred to [place] statin, encouraged heart healthy diet, avoid trans fats, minimize simple carbs and saturated fats. Increase exercise as tolerated. Fenofibrate added  Basal cell carcinoma of neck Continues to follow with oncology, doing well  OVERWEIGHT Encouraged DASH diet, decrease po intake and increase exercise as tolerated. Needs 7-8 hours of sleep nightly. Avoid trans fats, eat small, frequent meals every 4-5 hours with lean proteins, complex carbs and healthy fats. Minimize simple carbs, GMO foods.

## 2013-09-09 ENCOUNTER — Telehealth: Payer: Self-pay | Admitting: Family Medicine

## 2013-09-09 LAB — RENAL FUNCTION PANEL
ALBUMIN: 4.1 g/dL (ref 3.5–5.2)
BUN: 21 mg/dL (ref 6–23)
CO2: 26 meq/L (ref 19–32)
Calcium: 9 mg/dL (ref 8.4–10.5)
Chloride: 103 mEq/L (ref 96–112)
Creat: 1.11 mg/dL (ref 0.50–1.35)
Glucose, Bld: 213 mg/dL — ABNORMAL HIGH (ref 70–99)
Phosphorus: 3.7 mg/dL (ref 2.3–4.6)
Potassium: 4.3 mEq/L (ref 3.5–5.3)
SODIUM: 140 meq/L (ref 135–145)

## 2013-09-09 LAB — LIPID PANEL
CHOL/HDL RATIO: 5.3 ratio
Cholesterol: 117 mg/dL (ref 0–200)
HDL: 22 mg/dL — AB (ref >39)
LDL CALC: 18 mg/dL (ref 0–99)
Triglycerides: 385 mg/dL — ABNORMAL HIGH (ref <150)
VLDL: 77 mg/dL — ABNORMAL HIGH (ref 0–40)

## 2013-09-09 LAB — HEPATIC FUNCTION PANEL
ALT: 16 U/L (ref 0–53)
AST: 14 U/L (ref 0–37)
Albumin: 4.1 g/dL (ref 3.5–5.2)
Alkaline Phosphatase: 44 U/L (ref 39–117)
BILIRUBIN DIRECT: 0.1 mg/dL (ref 0.0–0.3)
BILIRUBIN TOTAL: 0.4 mg/dL (ref 0.2–1.2)
Indirect Bilirubin: 0.3 mg/dL (ref 0.2–1.2)
Total Protein: 6 g/dL (ref 6.0–8.3)

## 2013-09-09 LAB — T4, FREE: Free T4: 1.05 ng/dL (ref 0.80–1.80)

## 2013-09-09 LAB — HEMOGLOBIN A1C
Hgb A1c MFr Bld: 8.2 % — ABNORMAL HIGH (ref <5.7)
MEAN PLASMA GLUCOSE: 189 mg/dL — AB (ref <117)

## 2013-09-09 LAB — TSH: TSH: 3.012 u[IU]/mL (ref 0.350–4.500)

## 2013-09-09 NOTE — Telephone Encounter (Signed)
Relevant patient education assigned to patient using Emmi. ° °

## 2013-09-11 MED ORDER — ROSUVASTATIN CALCIUM 40 MG PO TABS: 40.0000 mg | ORAL_TABLET | Freq: Every day | ORAL | Status: DC

## 2013-09-17 ENCOUNTER — Encounter: Payer: Self-pay | Admitting: Family Medicine

## 2013-09-17 NOTE — Assessment & Plan Note (Signed)
Encouraged DASH diet, decrease po intake and increase exercise as tolerated. Needs 7-8 hours of sleep nightly. Avoid trans fats, eat small, frequent meals every 4-5 hours with lean proteins, complex carbs and healthy fats. Minimize simple carbs, GMO foods. 

## 2013-09-17 NOTE — Assessment & Plan Note (Signed)
Continues to follow with oncology, doing well

## 2013-09-17 NOTE — Assessment & Plan Note (Signed)
Well controlled, no changes to meds. Encouraged heart healthy diet such as the DASH diet and exercise as tolerated.  °

## 2013-09-17 NOTE — Assessment & Plan Note (Addendum)
A1C stable but elevated. Minimize simple carbs. Given refill on Humalog. Follows with Opthamology, referred to Podiatry

## 2013-09-17 NOTE — Assessment & Plan Note (Signed)
Avoid offending foods, start probiotics. Do not eat large meals in late evening and consider raising head of bed.  

## 2013-09-17 NOTE — Assessment & Plan Note (Signed)
Tolerating statin, encouraged heart healthy diet, avoid trans fats, minimize simple carbs and saturated fats. Increase exercise as tolerated. Fenofibrate added

## 2013-09-22 MED ORDER — INSULIN ASPART 100 UNIT/ML ~~LOC~~ SOLN: SUBCUTANEOUS | Status: DC

## 2013-09-22 NOTE — Telephone Encounter (Signed)
Per md inform pt that insurance will not pay for Humalog but will pay forNovalog.  Send in new rx for novolog with same directions.  Left a detailed message on patients vm and sent in RX

## 2013-09-26 ENCOUNTER — Ambulatory Visit: Payer: Self-pay

## 2013-09-30 ENCOUNTER — Other Ambulatory Visit: Payer: Self-pay | Admitting: Family Medicine

## 2013-10-06 ENCOUNTER — Ambulatory Visit (INDEPENDENT_AMBULATORY_CARE_PROVIDER_SITE_OTHER): Payer: BC Managed Care – PPO

## 2013-10-06 ENCOUNTER — Other Ambulatory Visit: Payer: Self-pay | Admitting: Family Medicine

## 2013-10-06 VITALS — BP 127/82 | HR 87 | Resp 18

## 2013-10-06 DIAGNOSIS — E119 Type 2 diabetes mellitus without complications: Secondary | ICD-10-CM

## 2013-10-06 DIAGNOSIS — B070 Plantar wart: Secondary | ICD-10-CM

## 2013-10-06 DIAGNOSIS — Q828 Other specified congenital malformations of skin: Secondary | ICD-10-CM

## 2013-10-06 NOTE — Progress Notes (Signed)
   Subjective:    Patient ID: Steven Ibarra, male    DOB: 12/02/1955, 58 y.o.   MRN: [id number]  HPI I HAVE SOMETHING ON THE BOTTOM OF MY LEFT FOOT AND HAS BEEN GOING ON FOR ABOUT 6 MONTHS AND I AM A DIABETIC AND HAVE BEEN FOR ABOUT 12 YEARS AND SORE AND TENDER AND I HAVE NOT TRIED ANYTHING OVER THE COUNTER    Review of Systems  HENT:       Ringing in ears  All other systems reviewed and are negative.      Objective:   Physical Exam 58 year old white male well-developed well-nourished oriented x3 with vital signs stable. Lower extremity objective findings intact with pedal pulses palpable DP +2/4 bilateral PT +2/4 bilateral capillary refill timed 3-4 seconds all digits. Epicritic and proprioceptive sensations intact and symmetric bilateral the only loss of sensation was in the inferior heel area on [employer] testing both feet. Intact vibratory sensation bilateral there is normal plantar response DTRs not elicited. Dermatologically skin color pigment normal hair growth present but diminished distally nails unremarkable there is new he keratotic lesion but to 3 mm diameter sub-fifth MTP area left foot. No history of injury or trauma noted no other lesions no open wounds or ulcerations noted. Orthopedic biomechanical exam reveals rectus foot type bilateral rectus digits mild flexible digital contractures are noted otherwise unremarkable. Additional findings exam time is mild edema both ankles with mild varicosities noted no open wounds or ulcerations.       Assessment & Plan:  Assessment verruca plantaris for support keratoses sub-fifth metatarsal left foot. Plan at this time discussed options and recommendations for topical salicylic acid application applied to 79% salicylic acid under occlusion for 24 hours patient will then remove and cleanse the area and debridement of any loose or dead skin. Also dispensed written instructions for topical salicylic acid application if not resolved  he applied digital treatment under occlusion utilizing OTC salicylic acid and duct tape for 7-14 days if needed. If not resolved completely within the next month followup for possible more invasive options. Contact us immediately if there is any changes complications or exacerbations of symptoms, or any signs of infection.  Purchase Raiquan Chandler DPM

## 2013-10-06 NOTE — Patient Instructions (Signed)

## 2013-11-02 ENCOUNTER — Other Ambulatory Visit: Payer: Self-pay | Admitting: Family Medicine

## 2013-11-04 ENCOUNTER — Other Ambulatory Visit: Payer: Self-pay | Admitting: Family Medicine

## 2013-12-07 ENCOUNTER — Other Ambulatory Visit: Payer: Self-pay | Admitting: Family Medicine

## 2013-12-14 ENCOUNTER — Telehealth: Payer: Self-pay | Admitting: Family Medicine

## 2013-12-14 DIAGNOSIS — E118 Type 2 diabetes mellitus with unspecified complications: Secondary | ICD-10-CM

## 2013-12-14 DIAGNOSIS — E1165 Type 2 diabetes mellitus with hyperglycemia: Secondary | ICD-10-CM

## 2013-12-14 DIAGNOSIS — E038 Other specified hypothyroidism: Secondary | ICD-10-CM

## 2013-12-14 DIAGNOSIS — I10 Essential (primary) hypertension: Secondary | ICD-10-CM

## 2013-12-14 DIAGNOSIS — IMO0002 Reserved for concepts with insufficient information to code with codable children: Secondary | ICD-10-CM

## 2013-12-14 DIAGNOSIS — E782 Mixed hyperlipidemia: Secondary | ICD-10-CM

## 2013-12-14 NOTE — Telephone Encounter (Signed)
Caller name: Bao  Relation to pt: self  Call back [phone]   Reason for call:   pt would rather have he's blood work done in [employer] due to it being close to he's work. Pt scheduled lab for [id number] [facility] 12/19/13

## 2013-12-14 NOTE — Telephone Encounter (Signed)
Lab order placed.

## 2013-12-15 ENCOUNTER — Other Ambulatory Visit: Payer: BC Managed Care – PPO

## 2013-12-19 ENCOUNTER — Other Ambulatory Visit (INDEPENDENT_AMBULATORY_CARE_PROVIDER_SITE_OTHER): Payer: BC Managed Care – PPO

## 2013-12-19 DIAGNOSIS — Z0000 Encounter for general adult medical examination without abnormal findings: Secondary | ICD-10-CM

## 2013-12-19 DIAGNOSIS — I10 Essential (primary) hypertension: Secondary | ICD-10-CM

## 2013-12-19 DIAGNOSIS — E038 Other specified hypothyroidism: Secondary | ICD-10-CM

## 2013-12-19 DIAGNOSIS — E1110 Type 2 diabetes mellitus with ketoacidosis without coma: Secondary | ICD-10-CM

## 2013-12-19 DIAGNOSIS — E1165 Type 2 diabetes mellitus with hyperglycemia: Secondary | ICD-10-CM

## 2013-12-19 DIAGNOSIS — E039 Hypothyroidism, unspecified: Secondary | ICD-10-CM

## 2013-12-19 DIAGNOSIS — IMO0002 Reserved for concepts with insufficient information to code with codable children: Secondary | ICD-10-CM

## 2013-12-19 DIAGNOSIS — E782 Mixed hyperlipidemia: Secondary | ICD-10-CM

## 2013-12-19 DIAGNOSIS — E118 Type 2 diabetes mellitus with unspecified complications: Secondary | ICD-10-CM

## 2013-12-19 LAB — LIPID PANEL
CHOL/HDL RATIO: 6
Cholesterol: 103 mg/dL (ref 0–200)
HDL: 18.6 mg/dL — ABNORMAL LOW (ref >39.00)
NONHDL: 84.4
TRIGLYCERIDES: 363 mg/dL — AB (ref 0.0–149.0)
VLDL: 72.6 mg/dL — AB (ref 0.0–40.0)

## 2013-12-19 LAB — HEPATIC FUNCTION PANEL
ALT: 24 U/L (ref 0–53)
AST: 16 U/L (ref 0–37)
Albumin: 3.9 g/dL (ref 3.5–5.2)
Alkaline Phosphatase: 51 U/L (ref 39–117)
Bilirubin, Direct: 0.1 mg/dL (ref 0.0–0.3)
Total Bilirubin: 0.6 mg/dL (ref 0.2–1.2)
Total Protein: 6.6 g/dL (ref 6.0–8.3)

## 2013-12-19 LAB — CBC
HCT: 42.6 % (ref 39.0–52.0)
HEMOGLOBIN: 14.6 g/dL (ref 13.0–17.0)
MCHC: 34.4 g/dL (ref 30.0–36.0)
MCV: 86.2 fl (ref 78.0–100.0)
PLATELETS: 170 10*3/uL (ref 150.0–400.0)
RBC: 4.94 Mil/uL (ref 4.22–5.81)
RDW: 14.7 % (ref 11.5–15.5)
WBC: 4.7 10*3/uL (ref 4.0–10.5)

## 2013-12-19 LAB — TSH: TSH: 2.24 u[IU]/mL (ref 0.35–4.50)

## 2013-12-19 LAB — RENAL FUNCTION PANEL
Albumin: 3.9 g/dL (ref 3.5–5.2)
BUN: 19 mg/dL (ref 6–23)
CO2: 29 mEq/L (ref 19–32)
Calcium: 8.8 mg/dL (ref 8.4–10.5)
Chloride: 103 mEq/L (ref 96–112)
Creatinine, Ser: 1 mg/dL (ref 0.4–1.5)
GFR: 86.8 mL/min (ref >60.00)
Glucose, Bld: 257 mg/dL — ABNORMAL HIGH (ref 70–99)
Phosphorus: 3.4 mg/dL (ref 2.3–4.6)
Potassium: 4.1 mEq/L (ref 3.5–5.1)
Sodium: 137 mEq/L (ref 135–145)

## 2013-12-19 LAB — LDL CHOLESTEROL, DIRECT: Direct LDL: 40.8 mg/dL

## 2013-12-19 LAB — HEMOGLOBIN A1C: Hgb A1c MFr Bld: 9.2 % — ABNORMAL HIGH (ref 4.6–6.5)

## 2013-12-27 ENCOUNTER — Ambulatory Visit: Payer: BC Managed Care – PPO | Admitting: Radiation Oncology

## 2013-12-28 ENCOUNTER — Encounter: Payer: Self-pay | Admitting: Family Medicine

## 2013-12-28 ENCOUNTER — Ambulatory Visit (INDEPENDENT_AMBULATORY_CARE_PROVIDER_SITE_OTHER): Payer: BC Managed Care – PPO | Admitting: Family Medicine

## 2013-12-28 VITALS — BP 119/93 | HR 93 | Temp 97.9°F | Ht 66.0 in | Wt 299.2 lb

## 2013-12-28 DIAGNOSIS — Z23 Encounter for immunization: Secondary | ICD-10-CM

## 2013-12-28 DIAGNOSIS — IMO0002 Reserved for concepts with insufficient information to code with codable children: Secondary | ICD-10-CM

## 2013-12-28 DIAGNOSIS — I10 Essential (primary) hypertension: Secondary | ICD-10-CM

## 2013-12-28 DIAGNOSIS — C4441 Basal cell carcinoma of skin of scalp and neck: Secondary | ICD-10-CM

## 2013-12-28 DIAGNOSIS — Z0000 Encounter for general adult medical examination without abnormal findings: Secondary | ICD-10-CM

## 2013-12-28 DIAGNOSIS — E0869 Diabetes mellitus due to underlying condition with other specified complication: Secondary | ICD-10-CM

## 2013-12-28 DIAGNOSIS — E782 Mixed hyperlipidemia: Secondary | ICD-10-CM

## 2013-12-28 DIAGNOSIS — E663 Overweight: Secondary | ICD-10-CM

## 2013-12-28 DIAGNOSIS — E1165 Type 2 diabetes mellitus with hyperglycemia: Secondary | ICD-10-CM

## 2013-12-28 DIAGNOSIS — E039 Hypothyroidism, unspecified: Secondary | ICD-10-CM

## 2013-12-28 MED ORDER — INSULIN GLARGINE 100 UNIT/ML SOLOSTAR PEN: PEN_INJECTOR | SUBCUTANEOUS | Status: DC

## 2013-12-28 NOTE — Patient Instructions (Addendum)
Increase Lantus in the evening by 2 units every 3 days unless numbers start to drop to 130 or below. Max of 60 units, try to remember to use your Novolog    Labs prior to visit lipid, hgba1c, cbc, tsh, renal, hepatic, PSA Basic Carbohydrate Counting for Diabetes Mellitus Carbohydrate counting is a method for keeping track of the amount of carbohydrates you eat. Eating carbohydrates naturally increases the level of sugar (glucose) in your blood, so it is important for you to know the amount that is okay for you to have in every meal. Carbohydrate counting helps keep the level of glucose in your blood within normal limits. The amount of carbohydrates allowed is different for every person. A dietitian can help you calculate the amount that is right for you. Once you know the amount of carbohydrates you can have, you can count the carbohydrates in the foods you want to eat. Carbohydrates are found in the following foods:  Grains, such as breads and cereals.  Dried beans and soy products.  Starchy vegetables, such as potatoes, peas, and corn.  Fruit and fruit juices.  Milk and yogurt.  Sweets and snack foods, such as cake, cookies, candy, chips, soft drinks, and fruit drinks. CARBOHYDRATE COUNTING There are two ways to count the carbohydrates in your food. You can use either of the methods or a combination of both. Reading the "Nutrition Facts" on [place] The "Nutrition Facts" is an area that is included on the labels of almost all packaged food and beverages in the [place]. It includes the serving size of that food or beverage and information about the nutrients in each serving of the food, including the grams (g) of carbohydrate per serving.  Decide the number of servings of this food or beverage that you will be able to eat or drink. Multiply that number of servings by the number of grams of carbohydrate that is listed on the label for that serving. The total will be the amount of  carbohydrates you will be having when you eat or drink this food or beverage. Learning Standard Serving Sizes of Food When you eat food that is not packaged or does not include "Nutrition Facts" on the label, you need to measure the servings in order to count the amount of carbohydrates.A serving of most carbohydrate-rich foods contains about 15 g of carbohydrates. The following list includes serving sizes of carbohydrate-rich foods that provide 15 g ofcarbohydrate per serving:   1 slice of bread (1 oz) or 1 six-inch tortilla.    of a hamburger bun or English muffin.  4-6 crackers.   cup unsweetened dry cereal.    cup hot cereal.   cup rice or pasta.    cup mashed potatoes or  of a large baked potato.  1 cup fresh fruit or one small piece of fruit.    cup canned or frozen fruit or fruit juice.  1 cup milk.   cup plain fat-free yogurt or yogurt sweetened with artificial sweeteners.   cup cooked dried beans or starchy vegetable, such as peas, corn, or potatoes.  Decide the number of standard-size servings that you will eat. Multiply that number of servings by 15 (the grams of carbohydrates in that serving). For example, if you eat 2 cups of strawberries, you will have eaten 2 servings and 30 g of carbohydrates (2 servings x 15 g = 30 g). For foods such as soups and casseroles, in which more than one food is mixed in,  you will need to count the carbohydrates in each food that is included. EXAMPLE OF CARBOHYDRATE COUNTING Sample Dinner  3 oz chicken breast.   cup of brown rice.   cup of corn.  1 cup milk.   1 cup strawberries with sugar-free whipped topping.  Carbohydrate Calculation Step 1: Identify the foods that contain carbohydrates:   Rice.   Corn.   Milk.   Strawberries. Step 2:Calculate the number of servings eaten of each:   2 servings of rice.   1 serving of corn.   1 serving of milk.   1 serving of strawberries. Step 3:  Multiply each of those number of servings by 15 g:   2 servings of rice x 15 g = 30 g.   1 serving of corn x 15 g = 15 g.   1 serving of milk x 15 g = 15 g.   1 serving of strawberries x 15 g = 15 g. Step 4: Add together all of the amounts to find the total grams of carbohydrates eaten: 30 g + 15 g + 15 g + 15 g = 75 g. Document Released: 03/16/2005 Document Revised: 07/31/2013 Document Reviewed: 02/10/2013 [facility] Patient Information 2015 [place], [place]. This information is not intended to replace advice given to you by your health care provider. Make sure you discuss any questions you have with your health care provider.

## 2013-12-28 NOTE — Progress Notes (Signed)
Pre visit review using our clinic review tool, if applicable. No additional management support is needed unless otherwise documented below in the visit note. 

## 2013-12-29 ENCOUNTER — Ambulatory Visit: Payer: BC Managed Care – PPO | Admitting: Radiation Oncology

## 2013-12-31 NOTE — Progress Notes (Signed)
Patient ID: Steven Ibarra, male   DOB: 01-13-1956, 58 y.o.   MRN: [id number] Steven Ibarra [id number] [birth year] 12/31/2013      Progress Note-Follow Up  Subjective  Chief Complaint  Chief Complaint  Patient presents with  . Follow-up    A1C  . Injections    flu    HPI  Patient is a 58 year old male in today for routine medical care. Patient is in today for followup. Overall doing well technologist he has not been eating a good diabetic diet. In the last month he's had a low blood sugar of 170 and a high of 320. Most numbers have been above 200. Acknowledges increased fatigue as a result. Some mild polyuria and polydipsia are also noted. Denies CP/palp/SOB/HA/congestion/fevers/GI c/o. Taking meds as prescribed  Past Medical History  Diagnosis Date  . Hyperlipidemia   . Obesity   . Overweight(278.02) 10/23/2009  . Mixed hyperlipidemia 10/23/2009  . ELEVATED BLOOD PRESSURE 04/23/2010  . DYSPNEA ON EXERTION 10/23/2009  . ALLERGIC RHINITIS, SEASONAL 10/23/2009  . OSA (obstructive sleep apnea)   . SLEEP APNEA, OBSTRUCTIVE 10/23/2009    Sleep study Done at [facility]  . Hearing loss   . Hypertension     Does not see a cardiologist, has not had a stress, echo   . GERD (gastroesophageal reflux disease)   . Diabetes mellitus approx 2003    type 2  . DIABETES MELLITUS, TYPE II 10/23/2009  . S/P radiation therapy 07/06/11 - 08/19/11    Right Facial and Right Neck Nodes and right Trigeminal  Nerve to Base of Skull/ Total Dose 6600 cGy/ 33 Fractions  . Preventative health care 09/30/2011  . Hypothyroid 01/28/2012  . Otitis externa of right ear 07/06/2012  . Pulled muscle 07/06/12  . Cancer   . Skin cancer     on nose, 9-10 yrs ago  . Basal cell carcinoma of neck 05/28/2011    r suprahyoid, radical neck dissection S/p 6 weeks of targeted radiation therapy   . Type II or unspecified type diabetes mellitus with unspecified complication, uncontrolled     type 2     Past Surgical History   Procedure Laterality Date  . Tonsillectomy and adenoidectomy    . Skin cancer removal    . Radical neck dissection  05/28/2011    Procedure: RADICAL NECK DISSECTION;  Surgeon: Melissa Montane, MD;  Location: [facility] OR;  Service: ENT;  Laterality: N/A;  Suprahyoid Neck Dissection    Family History  Problem Relation Age of Onset  . Other Mother     CHF  . Stroke Father   . Hyperlipidemia Father   . Heart disease Father     s/p valve replacement  . Hyperlipidemia Brother   . Obesity Brother   . Other Brother     Back pain  . Hyperlipidemia Brother   . Hypertension Brother   . Other Brother     Panic attacks  . Hyperlipidemia Brother   . Anesthesia problems Neg Hx     History   Social History  . Marital Status: Married    Spouse Name: N/A    Number of Children: N/A  . Years of Education: N/A   Occupational History  . Not on file.   Social History Main Topics  . Smoking status: Former Smoker -- 1.00 packs/day for 9 years    Types: Cigarettes    Quit date: 03/31/1975  . Smokeless tobacco: Never Used  . Alcohol Use: No  Comment: 4 beers a month, 06/12/11 rarely uses now  . Drug Use: No  . Sexual Activity: Yes   Other Topics Concern  . Not on file   Social History Narrative   Patient is married.   Patient with a history of smoking one pack per day for approximately 29 years from ages of [age]. Patient denies ever having used smokeless tobacco. Patient with rare use of alcohol.      Mother died at age 54 from congestive heart failure complications. Father died at the age of 24 secondary to a stroke.    Current Outpatient Prescriptions on File Prior to Visit  Medication Sig Dispense Refill  . ACCU-CHEK AVIVA PLUS test strip USE AS INSTRUCTED  100 each  1  . Ascorbic Acid (VITAMIN C) 1000 MG tablet Take 1,000 mg by mouth daily.      Marland Kitchen aspirin 81 MG tablet Take 81 mg by mouth daily.      . B-D UF III MINI PEN NEEDLES 31G X 5 MM MISC USE TWICE DAILY AS DIRECTED WITH LANTUS  DX: 250.0  100 each  2  . cetirizine (ZYRTEC) 10 MG tablet Take 10 mg by mouth daily as needed for allergies.       Marland Kitchen CRESTOR 40 MG tablet TAKE 1 TABLET (40 MG TOTAL) BY MOUTH DAILY.  90 tablet  0  . fenofibrate 160 MG tablet       . fenofibrate 160 MG tablet TAKE 1 TABLET (160 MG TOTAL) BY MOUTH DAILY.  90 tablet  0  . ibuprofen (ADVIL,MOTRIN) 200 MG tablet Take 600 mg by mouth every 6 (six) hours as needed for pain.       Marland Kitchen insulin aspart (NOVOLOG) 100 UNIT/ML injection 4-10 units prn for high blood sugar. Test daily after dinner and see sliding scale for blood sugar  10 mL  4  . Insulin Syringe-Needle U-100 (B-D INSULIN SYRINGE) 31G X 5/16" 0.3 ML MISC Use bid as directed  DX 250.00  100 each  1  . Krill Oil 300 MG CAPS Take 1 capsule by mouth daily.      . Lancets (ACCU-CHEK MULTICLIX) lancets USE AS DIRECTED  102 each  1  . levothyroxine (SYNTHROID, LEVOTHROID) 75 MCG tablet TAKE 1 TABLET (75 MCG TOTAL) BY MOUTH DAILY.  30 tablet  0  . losartan (COZAAR) 50 MG tablet Take 1 tablet (50 mg total) by mouth daily.  30 tablet  2  . metFORMIN (GLUCOPHAGE) 500 MG tablet 2 tabs po bid and 1 tab po q noon  150 tablet  5  . Misc Natural Products (OSTEO BI-FLEX TRIPLE STRENGTH PO) Take 2 tablets by mouth daily.      . NON FORMULARY 1 each by Other route 4 (four) times daily. Accu-check multiclix lancets- use as directed      . ranitidine (ZANTAC) 300 MG tablet TAKE 1 TABLET BY MOUTH AT BEDTIME  30 tablet  3  . Syringe, Disposable, 3 ML MISC DX 250.0  Use bid as directed  100 each  2   No current facility-administered medications on file prior to visit.    No Known Allergies  Review of Systems  Review of Systems  Constitutional: Positive for malaise/fatigue. Negative for fever and chills.  HENT: Negative for congestion, hearing loss and nosebleeds.   Eyes: Negative for discharge.  Respiratory: Negative for cough, sputum production, shortness of breath and wheezing.   Cardiovascular:  Negative for chest pain, palpitations and leg swelling.  Gastrointestinal:  Negative for heartburn, nausea, vomiting, abdominal pain, diarrhea, constipation and blood in stool.  Genitourinary: Negative for dysuria, urgency, frequency and hematuria.  Musculoskeletal: Negative for back pain, falls and myalgias.  Skin: Negative for rash.  Neurological: Negative for dizziness, tremors, sensory change, focal weakness, loss of consciousness, weakness and headaches.  Endo/Heme/Allergies: Negative for polydipsia. Does not bruise/bleed easily.  Psychiatric/Behavioral: Negative for depression and suicidal ideas. The patient is not nervous/anxious and does not have insomnia.     Objective  BP 119/93  Pulse 93  Temp(Src) 97.9 F (36.6 C) (Oral)  Ht 5\' 6"  (1.676 m)  Wt 299 lb 3.2 oz (135.716 kg)  BMI 48.32 kg/m2  SpO2 96%  Physical Exam  Physical Exam  Constitutional: He is oriented to person, place, and time and well-developed, well-nourished, and in no distress. No distress.  HENT:  Head: Normocephalic and atraumatic.  Eyes: Conjunctivae are normal.  Neck: Neck supple. No thyromegaly present.  Cardiovascular: Normal rate, regular rhythm and normal heart sounds.   No murmur heard. Pulmonary/Chest: Effort normal and breath sounds normal. No respiratory distress.  Abdominal: He exhibits no distension and no mass. There is no tenderness.  Musculoskeletal: He exhibits no edema.  Neurological: He is alert and oriented to person, place, and time.  Skin: Skin is warm.  Psychiatric: Memory, affect and judgment normal.    Lab Results  Component Value Date   TSH 2.24 12/19/2013   Lab Results  Component Value Date   WBC 4.7 12/19/2013   HGB 14.6 12/19/2013   HCT 42.6 12/19/2013   MCV 86.2 12/19/2013   PLT 170.0 12/19/2013   Lab Results  Component Value Date   CREATININE 1.0 12/19/2013   BUN 19 12/19/2013   NA 137 12/19/2013   K 4.1 12/19/2013   CL 103 12/19/2013   CO2 29 12/19/2013   Lab  Results  Component Value Date   ALT 24 12/19/2013   AST 16 12/19/2013   ALKPHOS 51 12/19/2013   BILITOT 0.6 12/19/2013   Lab Results  Component Value Date   CHOL 103 12/19/2013   Lab Results  Component Value Date   HDL 18.60* 12/19/2013   Lab Results  Component Value Date   LDLCALC 18 09/08/2013   Lab Results  Component Value Date   TRIG 363.0* 12/19/2013   Lab Results  Component Value Date   CHOLHDL 6 12/19/2013     Assessment & Plan  Type II or unspecified type diabetes mellitus with unspecified complication, uncontrolled hgba1c not acceptable, minimize simple carbs. Increase exercise as tolerated. Continue current meds but increase Lantus to 60 units qhs as needed and as tolerated by 2 units every 3 days. Check sugars tid and qhs prn. Consider endocrinology referral if no improvement. Referred to Nutritionist today, requesting [facility] but is willing to go elsewhere if necessary  Hypertension Well controlled, no changes to meds. Encouraged heart healthy diet such as the DASH diet and exercise as tolerated.   Hypothyroid On Levothyroxine, continue to monitor  Overweight Encouraged DASH diet, decrease po intake and increase exercise as tolerated. Needs 7-8 hours of sleep nightly. Avoid trans fats, eat small, frequent meals every 4-5 hours with lean proteins, complex carbs and healthy fats. Minimize simple carbs, GMO foods.  Basal cell carcinoma of neck Is doing well has PET scan scheduled soon, no acute concerns.

## 2013-12-31 NOTE — Assessment & Plan Note (Signed)
On Levothyroxine, continue to monitor 

## 2013-12-31 NOTE — Assessment & Plan Note (Signed)
Well controlled, no changes to meds. Encouraged heart healthy diet such as the DASH diet and exercise as tolerated.  °

## 2013-12-31 NOTE — Assessment & Plan Note (Signed)
Is doing well has PET scan scheduled soon, no acute concerns.

## 2013-12-31 NOTE — Assessment & Plan Note (Addendum)
hgba1c not acceptable, minimize simple carbs. Increase exercise as tolerated. Continue current meds but increase Lantus to 60 units qhs as needed and as tolerated by 2 units every 3 days. Check sugars tid and qhs prn. Consider endocrinology referral if no improvement. Referred to Nutritionist today, requesting [facility] but is willing to go elsewhere if necessary

## 2013-12-31 NOTE — Assessment & Plan Note (Signed)
Encouraged DASH diet, decrease po intake and increase exercise as tolerated. Needs 7-8 hours of sleep nightly. Avoid trans fats, eat small, frequent meals every 4-5 hours with lean proteins, complex carbs and healthy fats. Minimize simple carbs, GMO foods. 

## 2014-01-04 ENCOUNTER — Telehealth: Payer: Self-pay | Admitting: Family Medicine

## 2014-01-04 DIAGNOSIS — E0869 Diabetes mellitus due to underlying condition with other specified complication: Secondary | ICD-10-CM

## 2014-01-04 MED ORDER — METFORMIN HCL 500 MG PO TABS: ORAL_TABLET | ORAL | Status: DC

## 2014-01-04 NOTE — Telephone Encounter (Signed)
Caller name: Chrystal Relation to pt: pharmacist Call back number: Pharmacy: [facility] [place]  Reason for call:  Pt is neediing a refill on metFORMIN (GLUCOPHAGE) 500 MG tablet ; pharmacist called in request

## 2014-01-05 ENCOUNTER — Ambulatory Visit
Admission: RE | Admit: 2014-01-05 | Discharge: 2014-01-05 | Disposition: A | Discharge status: Home / Self Care | Payer: BC Managed Care – PPO | Source: Ambulatory Visit | Attending: Radiation Oncology | Admitting: Radiation Oncology

## 2014-01-05 VITALS — BP 132/85 | HR 96 | Temp 97.4°F | Wt 299.2 lb

## 2014-01-05 DIAGNOSIS — C4441 Basal cell carcinoma of skin of scalp and neck: Secondary | ICD-10-CM

## 2014-01-05 NOTE — Progress Notes (Signed)
Patient  Here for routine follow up completion of head/neck cancer in 2013.Denies pain or any other problems.Last seen by Dr.Byers in February 2015.I did get appointment for follow up January 17, 2014.Holley Raring will check with Dr.Byers about ordering ct scan prior to this appointment.TSH level in normal limits 2.24.

## 2014-01-05 NOTE — Progress Notes (Signed)
Radiation Oncology         [phone]) [phone] ________________________________  Name: Steven Ibarra MRN: [id number]  Date: 01/05/2014  DOB: Dec 18, 1955  Follow-Up Visit Note  CC: Penni Homans, MD  Mosie Lukes, MD  Diagnosis and Prior Radiotherapy:     Basal Cell Carcinoma, Metastatic to the Submandibular Lymph Node, Right Neck  Interval Since Last Radiation: Completed total dose of 66Gy in 33 fractions to the right facial nodes through the right neck and right trigeminal nerve to base of skull on 08-19-11    ICD-9-CM ICD-10-CM  1. Basal cell carcinoma of neck 173.41 C44.41    Narrative:  The patient returns today for routine follow-up.         No complaints. No facial pain or tingling or numbness. No neck masses. Mouth slightly dry on right side, better with biotene gel.            ALLERGIES:  has No Known Allergies.  Meds: Current Outpatient Prescriptions  Medication Sig Dispense Refill  . ACCU-CHEK AVIVA PLUS test strip USE AS INSTRUCTED  100 each  1  . Ascorbic Acid (VITAMIN C) 1000 MG tablet Take 1,000 mg by mouth daily.      . B-D UF III MINI PEN NEEDLES 31G X 5 MM MISC USE TWICE DAILY AS DIRECTED WITH LANTUS DX: 250.0  100 each  2  . cetirizine (ZYRTEC) 10 MG tablet Take 10 mg by mouth daily as needed for allergies.       Marland Kitchen CRESTOR 40 MG tablet TAKE 1 TABLET (40 MG TOTAL) BY MOUTH DAILY.  90 tablet  0  . fenofibrate 160 MG tablet       . ibuprofen (ADVIL,MOTRIN) 200 MG tablet Take 600 mg by mouth every 6 (six) hours as needed for pain.       Marland Kitchen insulin aspart (NOVOLOG) 100 UNIT/ML injection 4-10 units prn for high blood sugar. Test daily after dinner and see sliding scale for blood sugar  10 mL  4  . Insulin Glargine (LANTUS SOLOSTAR) 100 UNIT/ML Solostar Pen 100 units SQ q am and 60 units SQ in pm  60 mL  3  . Insulin Syringe-Needle U-100 (B-D INSULIN SYRINGE) 31G X 5/16" 0.3 ML MISC Use bid as directed  DX 250.00  100 each  1  . Krill Oil 300 MG CAPS Take 1 capsule by  mouth daily.      . Lancets (ACCU-CHEK MULTICLIX) lancets USE AS DIRECTED  102 each  1  . levothyroxine (SYNTHROID, LEVOTHROID) 75 MCG tablet TAKE 1 TABLET (75 MCG TOTAL) BY MOUTH DAILY.  30 tablet  0  . losartan (COZAAR) 50 MG tablet Take 1 tablet (50 mg total) by mouth daily.  30 tablet  2  . metFORMIN (GLUCOPHAGE) 500 MG tablet 2 tabs po bid and 1 tab po q noon  150 tablet  5  . Misc Natural Products (OSTEO BI-FLEX TRIPLE STRENGTH PO) Take 2 tablets by mouth daily.      . NON FORMULARY 1 each by Other route 4 (four) times daily. Accu-check multiclix lancets- use as directed      . ranitidine (ZANTAC) 300 MG tablet TAKE 1 TABLET BY MOUTH AT BEDTIME  30 tablet  3  . Syringe, Disposable, 3 ML MISC DX 250.0  Use bid as directed  100 each  2  . aspirin 81 MG tablet Take 81 mg by mouth daily.      . fenofibrate 160 MG tablet TAKE 1 TABLET (  160 MG TOTAL) BY MOUTH DAILY.  90 tablet  0   No current facility-administered medications for this encounter.    Physical Findings: The patient is in no acute distress. Patient is alert and oriented.  weight is 299 lb 3.2 oz (135.716 kg). His temperature is 97.4 F (36.3 C). His blood pressure is 132/85 and his pulse is 96. His oxygen saturation is 94%. . Oropharyngeal mucosa is intact with no thrush or lesions. No palpable cervical or supraclavicular lymphadenopathy. Skin intact and smooth over neck.     Lab Findings: Lab Results  Component Value Date   WBC 4.7 12/19/2013   HGB 14.6 12/19/2013   HCT 42.6 12/19/2013   MCV 86.2 12/19/2013   PLT 170.0 12/19/2013    Lab Results  Component Value Date   TSH 2.24 12/19/2013    Radiographic Findings: No results found.  Impression/Plan:   1) Head and Neck Cancer Status: No evidence of disease  2) Nutritional Status: no active issues  3) Swallowing: No issues  4) Dental: Encouraged to continue regular followup with dentistry, and dental hygiene including fluoride rinses.  5) Energy: Dr Charlett Blake following  TSH and management of hypothyroidism  6) Social: No active social issues to address at this time  7) Follow-up in 7-8 mo. The patient was encouraged to call with any issues or questions before then. Continue f/u with Dr Janace Hoard - plans for CT scan in 1-2 months with him. We may be able to space out our follow-ups so we each see him yearly (ie Dr Janace Hoard in winter, me in summer) soon. But, if Dr Janace Hoard wants to see him more often, that is fine.  _____________________________________   [name], MD

## 2014-01-07 ENCOUNTER — Other Ambulatory Visit: Payer: Self-pay | Admitting: Family Medicine

## 2014-01-10 ENCOUNTER — Other Ambulatory Visit: Payer: Self-pay

## 2014-01-10 MED ORDER — RANITIDINE HCL 300 MG PO TABS: 150.0000 mg | ORAL_TABLET | Freq: Every day | ORAL | Status: DC

## 2014-01-11 ENCOUNTER — Other Ambulatory Visit: Payer: Self-pay | Admitting: Family Medicine

## 2014-01-18 ENCOUNTER — Encounter: Payer: BC Managed Care – PPO | Attending: Family Medicine | Admitting: Nutrition

## 2014-01-18 VITALS — Ht 78.0 in | Wt 305.7 lb

## 2014-01-18 DIAGNOSIS — E1165 Type 2 diabetes mellitus with hyperglycemia: Secondary | ICD-10-CM

## 2014-01-18 DIAGNOSIS — E118 Type 2 diabetes mellitus with unspecified complications: Principal | ICD-10-CM

## 2014-01-18 DIAGNOSIS — IMO0002 Reserved for concepts with insufficient information to code with codable children: Secondary | ICD-10-CM

## 2014-01-18 NOTE — Progress Notes (Signed)
  Medical Nutrition Therapy:  Appt start time: 0800 end time:  0930.   Assessment:  Primary concerns today: Diabetes. LIves with his wife. His wife does the cooking and shopping.  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 BS been running 200's in am and pm's. 100 units in am of Lanuts and 50 units in pm and then Novolog 10-15 units before breakfast and dinner.  Usually drinks an Unjury shake at lunch and pm snack. Unjury only has 4 g of CHO per packet and 21 g of protein. Doesn't like to stop and eat lunch. Uses a vial and syringe for his Novolog but a pen for his Lantus. Has been using his Novolog via for about three months and therefore using expired insulin most of the time.        He reports trying to eat better recently. 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   Preferred Learning Style:   Auditory  Visual  Learning Readiness:   Not ready  Contemplating  Ready  Change in progress  MEDICATIONS: See list   DIETARY INTAKE:  24-hr recall:  B ( AM): Wheaties and 1/2 banana  Snk ( AM): none  L ( PM): Unjury shake Snk ( PM): Unjury shake 4 g of CHO /21 g of protein D ( PM): Fish-fried, shrimp,baked potao and Diet sprite Snk ( PM): Few pb crackers (6) Beverages: water, Diet soda  Usual physical activity: Golf; has not been walking in lately but will start.  Estimated energy needs: 2000 calories 225 g carbohydrates 150 g protein 56 g fat  Progress Towards Goal(s):  In progress.   Nutritional Diagnosis:  NB-1.1 Food and nutrition-related knowledge deficit As related to Diabetes.  As evidenced by A1C > 9%.    Intervention:  Nutrition counseling and diabetes education on a lower fat, lower sodium, higher fiber CHO balanced diet.. Plan:  Aim for 3-4 Carb Choices per meal (45-60 grams) +/_ 1 either way Cut out snacks between meals. Include protein in moderation with your meals Consider reading food labels for Total Carbohydrate  Consider  increasing your activity  level 30 tor 60 minutes daily as tolerated Consider before all  meals and 2 hours after supper daily. Keep a BS log and bring at next visit. Continue  Taking Lantus and Novolog as directed by MD Goal: Get A1C to 7% in three months. 2.Lose 1 lb per week.  Talk to PCP about referral to Dr. Dorris Fetch  Ask PCP about getting Novolog in a pen for improved insulin control and take with meals as directed by MD.  Teaching Method Utilized:  Visual Auditory Hands on  Handouts given during visit include: T[facility] Nutrition Low Salt Diet Carb Counting and Food Label handouts Meal Plan Card The Plate Method  Barriers to learning/adherence to lifestyle change: none  Demonstrated degree of understanding via:  Teach Back   Monitoring/Evaluation:  Dietary intake, exercise, meal planning, SBG, and body weight in 1 month(s).

## 2014-01-18 NOTE — Patient Instructions (Signed)
Plan:  Aim for 3-4 Carb Choices per meal (45-60grams) +/- 1 either way  Cut out snacks Include protein in moderation with your meals and snacks Consider reading food labels for Total Carbohydrate  Consider  increasing your activity level by 30 for 60 minutes daily as tolerated Consider checking blood sugars before meals and 2 hours after supper daily. directed by MD  Consider taking medication  as directed by MD Goal: Get A1C to 7% in three months. 2.Lose 1 lb per week.

## 2014-01-19 ENCOUNTER — Telehealth: Payer: Self-pay | Admitting: Family Medicine

## 2014-01-19 MED ORDER — "INSULIN SYRINGE-NEEDLE U-100 31G X 5/16"" 0.3 ML MISC": Status: DC

## 2014-01-19 MED ORDER — RANITIDINE HCL 300 MG PO TABS: 300.0000 mg | ORAL_TABLET | Freq: Every day | ORAL | Status: DC

## 2014-01-19 NOTE — Telephone Encounter (Signed)
Caller name: Hilliard  Call back number:  [phone] Pharmacy: [place]  Reason for call: pt is needing these called to his pharmacy.   Insulin Syringe-Needle U-100 (B-D INSULIN SYRINGE) 31G X 5/16" 0.3 ML MISC  Syringe, Disposable, 3 ML MISC   Pt is also needing clarification for Rx ranitidine (ZANTAC) 300 MG tablet-  He states it says take 0.5 tablet (150mg ) but he states he has been taking 300mg  for the past few years so he has been taking the whole tablet.

## 2014-01-19 NOTE — Telephone Encounter (Signed)
Not sure why the ranitidine got sent in saying to take .5 mg.  Sent in correct rx

## 2014-01-19 NOTE — Telephone Encounter (Signed)
Pt was informed to take 300 mg. And that a new rx was sent

## 2014-01-23 ENCOUNTER — Telehealth: Payer: Self-pay

## 2014-01-23 ENCOUNTER — Other Ambulatory Visit: Payer: Self-pay | Admitting: Otolaryngology

## 2014-01-23 DIAGNOSIS — C770 Secondary and unspecified malignant neoplasm of lymph nodes of head, face and neck: Secondary | ICD-10-CM

## 2014-01-23 MED ORDER — INSULIN PEN NEEDLE 31G X 8 MM MISC: Status: DC

## 2014-01-23 NOTE — Telephone Encounter (Signed)
RX sent to pharmacy  

## 2014-01-25 ENCOUNTER — Telehealth: Payer: Self-pay | Admitting: Family Medicine

## 2014-01-25 MED ORDER — INSULIN ASPART 100 UNIT/ML ~~LOC~~ SOLN: SUBCUTANEOUS | Status: DC

## 2014-01-25 NOTE — Telephone Encounter (Signed)
Caller name: Aime Relation to pt: Call back Marie: [facility] [place].   Reason for call:  Pt states that he has increased his Rx insulin aspart (NOVOLOG) 100 UNIT/ML injection from 4-10 to 15 units 3 times daily. This was per his nutritionist.   He wants to have his Rx amount increased to accommodate.  Advise

## 2014-01-30 ENCOUNTER — Ambulatory Visit
Admission: RE | Admit: 2014-01-30 | Discharge: 2014-01-30 | Disposition: A | Discharge status: Home / Self Care | Payer: BC Managed Care – PPO | Source: Ambulatory Visit | Attending: Otolaryngology | Admitting: Otolaryngology

## 2014-01-30 ENCOUNTER — Other Ambulatory Visit: Payer: BC Managed Care – PPO

## 2014-01-30 DIAGNOSIS — C770 Secondary and unspecified malignant neoplasm of lymph nodes of head, face and neck: Secondary | ICD-10-CM

## 2014-01-30 MED ORDER — IOHEXOL 300 MG/ML  SOLN
75.0000 mL | Freq: Once | INTRAMUSCULAR | Status: AC | PRN
  Administered 2014-01-30: 75 mL via INTRAVENOUS

## 2014-02-02 ENCOUNTER — Other Ambulatory Visit (HOSPITAL_COMMUNITY): Payer: Self-pay | Admitting: Otolaryngology

## 2014-02-02 DIAGNOSIS — C770 Secondary and unspecified malignant neoplasm of lymph nodes of head, face and neck: Secondary | ICD-10-CM

## 2014-02-06 ENCOUNTER — Other Ambulatory Visit: Payer: Self-pay | Admitting: Family Medicine

## 2014-02-08 ENCOUNTER — Telehealth: Payer: Self-pay | Admitting: Family Medicine

## 2014-02-08 DIAGNOSIS — I10 Essential (primary) hypertension: Secondary | ICD-10-CM

## 2014-02-08 MED ORDER — LOSARTAN POTASSIUM 50 MG PO TABS: 50.0000 mg | ORAL_TABLET | Freq: Every day | ORAL | Status: DC

## 2014-02-08 NOTE — Telephone Encounter (Signed)
Losartan was sent today.  Pt informed that the RX for Ranitidine should be 300 mg  I informed pt that we sent the Ranitidine 300 mg in on 01-19-14 with 3 refills and we are showing confirmation. Pt states maybe the pharmacy refilled the old RX and is going to contact them

## 2014-02-08 NOTE — Telephone Encounter (Signed)
Caller name: Drelyn Relation to pt: self Call back number: [phone] [facility] [place]  Reason for call:   Patient states that he has called his pharmacy regarding losartan refill a few days ago. Please send refill.    Please call patient. He states the pharmacy messed up on ranitidine directions and dosage.

## 2014-02-12 ENCOUNTER — Encounter (HOSPITAL_COMMUNITY)
Admission: RE | Admit: 2014-02-12 | Discharge: 2014-02-12 | Disposition: A | Discharge status: Home / Self Care | Payer: BC Managed Care – PPO | Source: Ambulatory Visit | Attending: Otolaryngology | Admitting: Otolaryngology

## 2014-02-12 ENCOUNTER — Encounter (HOSPITAL_COMMUNITY): Payer: Self-pay

## 2014-02-12 DIAGNOSIS — C770 Secondary and unspecified malignant neoplasm of lymph nodes of head, face and neck: Secondary | ICD-10-CM | POA: Diagnosis present

## 2014-02-12 LAB — GLUCOSE, CAPILLARY: GLUCOSE-CAPILLARY: 193 mg/dL — AB (ref 70–99)

## 2014-02-12 MED ORDER — FLUDEOXYGLUCOSE F - 18 (FDG) INJECTION
14.9000 | Freq: Once | INTRAVENOUS | Status: AC | PRN
  Administered 2014-02-12: 14.9 via INTRAVENOUS

## 2014-02-15 ENCOUNTER — Other Ambulatory Visit (HOSPITAL_COMMUNITY): Payer: Self-pay | Admitting: Otolaryngology

## 2014-02-15 DIAGNOSIS — R918 Other nonspecific abnormal finding of lung field: Secondary | ICD-10-CM

## 2014-02-19 ENCOUNTER — Encounter: Payer: BC Managed Care – PPO | Attending: Family Medicine | Admitting: Nutrition

## 2014-02-19 ENCOUNTER — Encounter: Payer: Self-pay | Admitting: Nutrition

## 2014-02-19 VITALS — Ht 78.0 in | Wt 299.8 lb

## 2014-02-19 DIAGNOSIS — Z713 Dietary counseling and surveillance: Secondary | ICD-10-CM | POA: Insufficient documentation

## 2014-02-19 DIAGNOSIS — E1165 Type 2 diabetes mellitus with hyperglycemia: Secondary | ICD-10-CM

## 2014-02-19 DIAGNOSIS — IMO0002 Reserved for concepts with insufficient information to code with codable children: Secondary | ICD-10-CM

## 2014-02-19 DIAGNOSIS — E118 Type 2 diabetes mellitus with unspecified complications: Secondary | ICD-10-CM | POA: Insufficient documentation

## 2014-02-19 DIAGNOSIS — Z794 Long term (current) use of insulin: Secondary | ICD-10-CM | POA: Insufficient documentation

## 2014-02-19 NOTE — Patient Instructions (Signed)
Plan:  Aim for 3-4 Carb Choices per meal (45-60 grams) +/_ 1 either way Cut out snacks between meals. Be sure to eat lunch of 45-60 carbs daily. Include protein in moderation with your meals Consider reading food labels for Total Carbohydrate  Consider  increasing your activity level 30 tor 60 minutes daily as tolerated Consider checking blood sugars before all  meals and 2 hours after supper daily. Keep a BS log and bring at next visit. Continue  Taking Lantus 100 units in am and 52 at night and Novolog with meals as directed by MD Goal: Get A1C to 7% in three months. 2.Lose 1 lb per week.  Pt. Will wait to talk about referral to Dr. Dorris Fetch in February if needed. Ask PCP about getting Novolog in a pen for improved insulin control and take with meals as directed by MD.

## 2014-02-19 NOTE — Progress Notes (Signed)
  Medical Nutrition Therapy:  Appt start time: 0830 end time:  900  Assessment:  Primary concerns today: Diabetes Follow up. Lost 6 lbs since last visit. Eating only 2 meals per day and drinking Injury twice or three times per day. Skips lunch mostly and just drinks an Injury drink, sometimes with an apple. FBS are elevated in 180's. Pre/post meals BS are 150-200's. BS are better. Has been taking meal time insulin after breakfast rather than before meal. Eats snack between lunch and dinner and late at night also. Tries to take Novolog again with his 2 hr pp blood sugar after dinner and thought he was suppose to eat again to keep his BS from dropping. He hasn't been exercising yet. Had to have a PET and CAT Scan recently and wasn't able to take his metformin a few days before due to the dye used. Feels good. No complaints. Denies any low blood sugars.   Preferred Learning Style:   Auditory  Visual  Learning Readiness:   Not ready  Contemplating  Ready  Change in progress  MEDICATIONS: See list   DIETARY INTAKE:  24-hr recall:  B ( AM): Wheaties and 1/2 banana  Snk ( AM): none  L ( PM): Unjury shake + apple Snk ( PM): Unjury shake 4 g of CHO /21 g of protein D ( PM): Meat and three vegetables, water Snk ( PM): fruit and pb. Beverages: water, Diet soda Only drinking three bottles of water during the day usually. Encouraged to increase to 5-6 bottles per day to help improve blood sugars.  Usual physical activity: None right now.  Estimated energy needs: 2000 calories 225 g carbohydrates 150 g protein 56 g fat  Progress Towards Goal(s):  In progress.   Nutritional Diagnosis:  NB-1.1 Food and nutrition-related knowledge deficit As related to Diabetes.  As evidenced by A1C > 9%.    Intervention:  Nutrition counseling and diabetes education on a lower fat, lower sodium, higher fiber CHO balanced diet.. Plan:  Be sure to take Novolog before meals based on fasting blood sugar  before meal and not 2 hour after meal blood sugar. Aim for 3-4 Carb Choices per meal (45-60 grams) +/_ 1 either way Cut out snacks between meals. Be sure to eat lunch of 45-60 carbs daily. Include protein in moderation with your meals Consider reading food labels for Total Carbohydrate  Consider  increasing your activity level 30 tor 60 minutes daily as tolerated Consider checking blood sugars before all  meals and 2 hours after supper daily. Keep a BS log and bring at next visit. Continue  Taking Lantus 100 units in am and 52 at night and Novolog with meals as directed by MD Goal: Get A1C to 7% in three months. 2.Lose 1 lb per week.  Pt. Will wait to talk about referral to Dr. Dorris Fetch in February if needed. Ask PCP about getting Novolog in a pen for improved insulin control and take with meals as directed by MD.  Teaching Method Utilized:  Visual Auditory Hands on  Handouts given during visit include: [facility] Nutrition Low Salt Diet Carb Counting and Food Label handouts Meal Plan Card The Plate Method  Barriers to learning/adherence to lifestyle change: none  Demonstrated degree of understanding via:  Teach Back   Monitoring/Evaluation:  Dietary intake, exercise, meal planning, SBG, and body weight in 1 month(s).

## 2014-02-27 ENCOUNTER — Telehealth: Payer: Self-pay | Admitting: Family Medicine

## 2014-02-27 NOTE — Telephone Encounter (Signed)
Please advise 

## 2014-02-27 NOTE — Telephone Encounter (Signed)
Caller name: Keldrick, [place] Relation to HY:WVPX Call back number: [phone] Pharmacy:  Reason for call: pt is needing a rx faxed in to [facility] for a c-pap machine. Fax to [phone].

## 2014-02-27 NOTE — Telephone Encounter (Signed)
OK but I need more details, when was his sleep study, who did it. How long has he had his current machine. If it has been awhile he may need a new sleep study before I can prescribe. At the very least I need a copy of the last sleep study

## 2014-02-27 NOTE — Telephone Encounter (Signed)
Can you please check on this?

## 2014-02-28 NOTE — Telephone Encounter (Signed)
Please let him know I recommend a new study and will refer him to pulmonology if he agrees. With everything he has been through he could easily need new settings.

## 2014-02-28 NOTE — Telephone Encounter (Signed)
Sleep study is under media 01/01/2009. Machine is 57 years old. Does he need a more recent study?

## 2014-03-01 NOTE — Telephone Encounter (Signed)
Spoke with patient and advised recommendations. Agrees with plan.  Which pulmonologist would you like to refer to?

## 2014-03-02 ENCOUNTER — Other Ambulatory Visit: Payer: Self-pay | Admitting: Family Medicine

## 2014-03-02 DIAGNOSIS — G4730 Sleep apnea, unspecified: Secondary | ICD-10-CM

## 2014-03-02 NOTE — Telephone Encounter (Signed)
i referred to Dr Elsworth Soho here in [facility]

## 2014-03-05 ENCOUNTER — Other Ambulatory Visit: Payer: Self-pay | Admitting: Radiology

## 2014-03-07 ENCOUNTER — Telehealth: Payer: Self-pay | Admitting: Family Medicine

## 2014-03-07 DIAGNOSIS — R0489 Hemorrhage from other sites in respiratory passages: Secondary | ICD-10-CM

## 2014-03-07 NOTE — Telephone Encounter (Signed)
Caller name:Wofford, Yanis Relation to AC:ZYSA Call back number:[phone] Pharmacy:  Reason for call: pt states that he is unable to get in for his sleep study until next year, wanted to know if he can go somewhere else. Also pt states he needs an rx to get his c-pap machine before the end of the year for insurance purposes. States he has had his for 5 years and it is worn out. And the company needs a new rx.

## 2014-03-08 ENCOUNTER — Observation Stay (HOSPITAL_COMMUNITY)
Admission: RE | Admit: 2014-03-08 | Discharge: 2014-03-09 | Disposition: A | Discharge status: Home / Self Care | Payer: BC Managed Care – PPO | Source: Ambulatory Visit | Attending: Emergency Medicine | Admitting: Emergency Medicine

## 2014-03-08 ENCOUNTER — Encounter (HOSPITAL_COMMUNITY): Payer: Self-pay

## 2014-03-08 ENCOUNTER — Other Ambulatory Visit: Payer: Self-pay | Admitting: Family Medicine

## 2014-03-08 ENCOUNTER — Inpatient Hospital Stay (HOSPITAL_COMMUNITY): Payer: BC Managed Care – PPO

## 2014-03-08 DIAGNOSIS — E669 Obesity, unspecified: Secondary | ICD-10-CM | POA: Diagnosis not present

## 2014-03-08 DIAGNOSIS — C3490 Malignant neoplasm of unspecified part of unspecified bronchus or lung: Secondary | ICD-10-CM

## 2014-03-08 DIAGNOSIS — G4733 Obstructive sleep apnea (adult) (pediatric): Secondary | ICD-10-CM | POA: Diagnosis not present

## 2014-03-08 DIAGNOSIS — E039 Hypothyroidism, unspecified: Secondary | ICD-10-CM | POA: Insufficient documentation

## 2014-03-08 DIAGNOSIS — Z7982 Long term (current) use of aspirin: Secondary | ICD-10-CM | POA: Insufficient documentation

## 2014-03-08 DIAGNOSIS — Z79899 Other long term (current) drug therapy: Secondary | ICD-10-CM | POA: Diagnosis not present

## 2014-03-08 DIAGNOSIS — E119 Type 2 diabetes mellitus without complications: Secondary | ICD-10-CM | POA: Diagnosis not present

## 2014-03-08 DIAGNOSIS — Z794 Long term (current) use of insulin: Secondary | ICD-10-CM | POA: Diagnosis not present

## 2014-03-08 DIAGNOSIS — J9601 Acute respiratory failure with hypoxia: Secondary | ICD-10-CM

## 2014-03-08 DIAGNOSIS — Z923 Personal history of irradiation: Secondary | ICD-10-CM | POA: Diagnosis not present

## 2014-03-08 DIAGNOSIS — K219 Gastro-esophageal reflux disease without esophagitis: Secondary | ICD-10-CM | POA: Insufficient documentation

## 2014-03-08 DIAGNOSIS — I10 Essential (primary) hypertension: Secondary | ICD-10-CM | POA: Diagnosis not present

## 2014-03-08 DIAGNOSIS — R0489 Hemorrhage from other sites in respiratory passages: Secondary | ICD-10-CM | POA: Diagnosis not present

## 2014-03-08 DIAGNOSIS — R0902 Hypoxemia: Secondary | ICD-10-CM | POA: Diagnosis not present

## 2014-03-08 DIAGNOSIS — C3411 Malignant neoplasm of upper lobe, right bronchus or lung: Secondary | ICD-10-CM | POA: Diagnosis not present

## 2014-03-08 DIAGNOSIS — Z87891 Personal history of nicotine dependence: Secondary | ICD-10-CM | POA: Diagnosis not present

## 2014-03-08 DIAGNOSIS — R042 Hemoptysis: Secondary | ICD-10-CM

## 2014-03-08 DIAGNOSIS — Z859 Personal history of malignant neoplasm, unspecified: Secondary | ICD-10-CM | POA: Insufficient documentation

## 2014-03-08 DIAGNOSIS — Z9989 Dependence on other enabling machines and devices: Secondary | ICD-10-CM

## 2014-03-08 DIAGNOSIS — G4730 Sleep apnea, unspecified: Secondary | ICD-10-CM

## 2014-03-08 DIAGNOSIS — J9600 Acute respiratory failure, unspecified whether with hypoxia or hypercapnia: Secondary | ICD-10-CM | POA: Diagnosis not present

## 2014-03-08 DIAGNOSIS — E785 Hyperlipidemia, unspecified: Secondary | ICD-10-CM | POA: Diagnosis not present

## 2014-03-08 DIAGNOSIS — R918 Other nonspecific abnormal finding of lung field: Secondary | ICD-10-CM | POA: Diagnosis present

## 2014-03-08 HISTORY — PX: LUNG BIOPSY: SHX232

## 2014-03-08 HISTORY — DX: Hemorrhage from other sites in respiratory passages: R04.89

## 2014-03-08 HISTORY — DX: Malignant neoplasm of unspecified part of unspecified bronchus or lung: C34.90

## 2014-03-08 LAB — CBC
HCT: 42.4 % (ref 39.0–52.0)
HCT: 40.7 % (ref 39.0–52.0)
HEMATOCRIT: 41.2 % (ref 39.0–52.0)
HEMATOCRIT: 39.3 % (ref 39.0–52.0)
HEMOGLOBIN: 14.3 g/dL (ref 13.0–17.0)
Hemoglobin: 14.8 g/dL (ref 13.0–17.0)
Hemoglobin: 14.3 g/dL (ref 13.0–17.0)
Hemoglobin: 13.6 g/dL (ref 13.0–17.0)
MCH: 30 pg (ref 26.0–34.0)
MCH: 29.9 pg (ref 26.0–34.0)
MCH: 29.7 pg (ref 26.0–34.0)
MCH: 29.2 pg (ref 26.0–34.0)
MCHC: 34.9 g/dL (ref 30.0–36.0)
MCHC: 34.7 g/dL (ref 30.0–36.0)
MCHC: 35.1 g/dL (ref 30.0–36.0)
MCHC: 34.6 g/dL (ref 30.0–36.0)
MCV: 85.8 fL (ref 78.0–100.0)
MCV: 86.2 fL (ref 78.0–100.0)
MCV: 84.4 fL (ref 78.0–100.0)
MCV: 84.3 fL (ref 78.0–100.0)
PLATELETS: 140 10*3/uL — AB (ref 150–400)
PLATELETS: 167 10*3/uL (ref 150–400)
Platelets: 150 10*3/uL (ref 150–400)
Platelets: 159 10*3/uL (ref 150–400)
RBC: 4.94 MIL/uL (ref 4.22–5.81)
RBC: 4.78 MIL/uL (ref 4.22–5.81)
RBC: 4.82 MIL/uL (ref 4.22–5.81)
RBC: 4.66 MIL/uL (ref 4.22–5.81)
RDW: 13.8 % (ref 11.5–15.5)
RDW: 13.7 % (ref 11.5–15.5)
RDW: 13.7 % (ref 11.5–15.5)
RDW: 13.8 % (ref 11.5–15.5)
WBC: 4.9 10*3/uL (ref 4.0–10.5)
WBC: 6.5 10*3/uL (ref 4.0–10.5)
WBC: 7.8 10*3/uL (ref 4.0–10.5)
WBC: 6 10*3/uL (ref 4.0–10.5)

## 2014-03-08 LAB — GLUCOSE, CAPILLARY
Glucose-Capillary: 218 mg/dL — ABNORMAL HIGH (ref 70–99)
Glucose-Capillary: 202 mg/dL — ABNORMAL HIGH (ref 70–99)
Glucose-Capillary: 220 mg/dL — ABNORMAL HIGH (ref 70–99)
Glucose-Capillary: 162 mg/dL — ABNORMAL HIGH (ref 70–99)
Glucose-Capillary: 214 mg/dL — ABNORMAL HIGH (ref 70–99)
Glucose-Capillary: 166 mg/dL — ABNORMAL HIGH (ref 70–99)

## 2014-03-08 LAB — COMPREHENSIVE METABOLIC PANEL
ALT: 18 U/L (ref 0–53)
AST: 13 U/L (ref 0–37)
Albumin: 3.5 g/dL (ref 3.5–5.2)
Alkaline Phosphatase: 45 U/L (ref 39–117)
Anion gap: 13 (ref 5–15)
BUN: 24 mg/dL — AB (ref 6–23)
CALCIUM: 8.8 mg/dL (ref 8.4–10.5)
CHLORIDE: 103 meq/L (ref 96–112)
CO2: 25 meq/L (ref 19–32)
Creatinine, Ser: 0.93 mg/dL (ref 0.50–1.35)
GFR calc Af Amer: >90 mL/min (ref >90)
GFR calc non Af Amer: >90 mL/min (ref >90)
GLUCOSE: 229 mg/dL — AB (ref 70–99)
Potassium: 4.6 mEq/L (ref 3.7–5.3)
Sodium: 141 mEq/L (ref 137–147)
Total Bilirubin: 0.4 mg/dL (ref 0.3–1.2)
Total Protein: 6.2 g/dL (ref 6.0–8.3)

## 2014-03-08 LAB — MRSA PCR SCREENING: MRSA by PCR: NEGATIVE

## 2014-03-08 LAB — APTT: APTT: 28 s (ref 24–37)

## 2014-03-08 LAB — PROTIME-INR
INR: 0.95 (ref 0.00–1.49)
Prothrombin Time: 12.8 seconds (ref 11.6–15.2)

## 2014-03-08 MED ORDER — SODIUM CHLORIDE 0.9 % IV SOLN: Freq: Once | INTRAVENOUS | Status: DC

## 2014-03-08 MED ORDER — ACETAMINOPHEN 325 MG PO TABS: 650.0000 mg | ORAL_TABLET | Freq: Four times a day (QID) | ORAL | Status: DC | PRN

## 2014-03-08 MED ORDER — MIDAZOLAM HCL 2 MG/2ML IJ SOLN
INTRAMUSCULAR | Status: AC
  Filled 2014-03-08: qty 4

## 2014-03-08 MED ORDER — FENTANYL CITRATE 0.05 MG/ML IJ SOLN
INTRAMUSCULAR | Status: DC | PRN
  Administered 2014-03-08: 50 ug via INTRAVENOUS

## 2014-03-08 MED ORDER — FENTANYL CITRATE 0.05 MG/ML IJ SOLN
INTRAMUSCULAR | Status: DC
Start: 2014-03-08 — End: 2014-03-08
  Filled 2014-03-08: qty 4

## 2014-03-08 MED ORDER — INSULIN ASPART 100 UNIT/ML ~~LOC~~ SOLN
0.0000 [IU] | SUBCUTANEOUS | Status: DC
  Administered 2014-03-08 (×2): 4 [IU] via SUBCUTANEOUS
  Administered 2014-03-08: 7 [IU] via SUBCUTANEOUS
  Administered 2014-03-09 (×2): 4 [IU] via SUBCUTANEOUS
  Administered 2014-03-09: 7 [IU] via SUBCUTANEOUS

## 2014-03-08 MED ORDER — PANTOPRAZOLE SODIUM 40 MG IV SOLR
40.0000 mg | Freq: Every day | INTRAVENOUS | Status: DC
  Administered 2014-03-08: 40 mg via INTRAVENOUS
  Filled 2014-03-08 (×2): qty 40

## 2014-03-08 MED ORDER — SODIUM CHLORIDE 0.9 % IV SOLN
INTRAVENOUS | Status: DC
  Administered 2014-03-08: 12:00:00 via INTRAVENOUS

## 2014-03-08 MED ORDER — LIDOCAINE HCL 1 % IJ SOLN
INTRAMUSCULAR | Status: AC
  Filled 2014-03-08: qty 20

## 2014-03-08 MED ORDER — MIDAZOLAM HCL 2 MG/2ML IJ SOLN
INTRAMUSCULAR | Status: DC | PRN
  Administered 2014-03-08 (×2): 1 mg via INTRAVENOUS

## 2014-03-08 MED ORDER — SODIUM CHLORIDE 0.9 % IV SOLN: 250.0000 mL | INTRAVENOUS | Status: DC | PRN

## 2014-03-08 MED ORDER — LEVOTHYROXINE SODIUM 75 MCG PO TABS
75.0000 ug | ORAL_TABLET | Freq: Every day | ORAL | Status: DC
  Administered 2014-03-09: 75 ug via ORAL
  Filled 2014-03-08 (×2): qty 1

## 2014-03-08 MED ORDER — FENTANYL CITRATE 0.05 MG/ML IJ SOLN
25.0000 ug | INTRAMUSCULAR | Status: DC | PRN
  Administered 2014-03-08 (×3): 50 ug via INTRAVENOUS
  Administered 2014-03-08: 25 ug via INTRAVENOUS
  Administered 2014-03-08 – 2014-03-09 (×4): 50 ug via INTRAVENOUS
  Filled 2014-03-08 (×8): qty 2

## 2014-03-08 NOTE — Telephone Encounter (Signed)
Please see if anyone else, maybe in [facility] can see him and run his sleep study sooner than we can if not I will order the sleep study directly if they can accommodate that faster and then he can see pulmonology after that.

## 2014-03-08 NOTE — H&P (Signed)
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  Routine f/u scans revealed new bilat lung nodules and pt presented 12/10 for elective IR needle bx of RUL nodule.  Good bx specimens were collected but post procedure pt developed pulmonary hemorrhage and significant hypoxia and PCCM called to admit.   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  Routine f/u scans revealed new bilat lung nodules and pt presented 12/10 for elective IR needle bx of RUL nodule.  Good bx specimens were collected but post procedure pt developed pulmonary hemorrhage and significant hypoxia and PCCM called to admit. Seen in CT. Denies significant SOB, chest pain, lightheadedness.  Had 1 episode large amount hemorrhage, none further.    PAST MEDICAL HISTORY :   has a past medical history of Hyperlipidemia; Obesity; Overweight(278.02) (10/23/2009); Mixed hyperlipidemia (10/23/2009); ELEVATED BLOOD PRESSURE (04/23/2010); DYSPNEA ON EXERTION (10/23/2009); ALLERGIC RHINITIS, SEASONAL (10/23/2009); OSA (obstructive sleep apnea); SLEEP APNEA, OBSTRUCTIVE (10/23/2009); Hearing loss; Hypertension; GERD (gastroesophageal reflux disease); Diabetes mellitus (approx 2003); DIABETES MELLITUS, TYPE II (10/23/2009); S/P radiation therapy (07/06/11 - 08/19/11); Preventative health care (09/30/2011); Hypothyroid (01/28/2012); Otitis externa of right ear (07/06/2012); Pulled muscle (07/06/12); Cancer; Skin cancer; Basal cell carcinoma of neck  (05/28/2011); and Type II or unspecified type diabetes mellitus with unspecified complication, uncontrolled.  has past surgical history that includes Tonsillectomy and adenoidectomy; skin cancer removal; and Radical neck dissection (05/28/2011). Prior to Admission medications   Medication Sig Start Date End Date Taking? Authorizing Provider  ACCU-CHEK AVIVA PLUS test strip USE AS DIRECTED 02/06/14  Yes Mosie Lukes, MD  ACCU-CHEK FASTCLIX LANCETS MISC USE AS DIRECTED 02/06/14  Yes Mosie Lukes, MD  Ascorbic Acid (VITAMIN C) 1000 MG tablet Take 1,000 mg by mouth daily.   Yes Historical Provider, MD  cetirizine (ZYRTEC) 10 MG tablet Take 10 mg by mouth daily as needed for allergies.    Yes Historical Provider, MD  CRESTOR 40 MG tablet TAKE 1 TABLET (40 MG TOTAL) BY MOUTH DAILY. 12/07/13  Yes Mosie Lukes, MD  fenofibrate 160 MG tablet TAKE 1 TABLET (160 MG TOTAL) BY MOUTH DAILY. 12/07/13  Yes Mosie Lukes, MD  ibuprofen (ADVIL,MOTRIN) 200 MG tablet Take 600 mg by mouth every 6 (six) hours as needed for pain.    Yes Historical Provider, MD  insulin aspart (NOVOLOG) 100 UNIT/ML injection 15 units tid for high blood sugar. Test daily after dinner and see sliding scale for blood sugar  Please dispense the amount pt needs for 1 month 01/25/14  Yes Mosie Lukes, MD  Insulin Glargine (LANTUS SOLOSTAR) 100 UNIT/ML Solostar Pen 100 units SQ q am and 60 units SQ in pm 12/28/13  Yes Mosie Lukes, MD  Insulin Pen Needle 31G X 8 MM MISC Use bid prn  DX: E11.8 01/23/14  Yes Mosie Lukes, MD  Krill Oil 300 MG CAPS Take 1 capsule by mouth daily.   Yes Historical Provider, MD  levothyroxine (SYNTHROID, LEVOTHROID) 75 MCG tablet TAKE 1 TABLET (75 MCG TOTAL) BY MOUTH DAILY. 01/08/14  Yes Mosie Lukes, MD  losartan (COZAAR) 50 MG tablet Take 1 tablet (50 mg total) by mouth daily. 02/08/14  Yes Mosie Lukes, MD  metFORMIN (GLUCOPHAGE) 500 MG tablet 2 tabs po bid and 1 tab po q noon 01/04/14  Yes Mosie Lukes, MD  Misc Natural Products (OSTEO BI-FLEX TRIPLE STRENGTH PO) Take 2 tablets by mouth daily.   Yes Historical Provider, MD  NON FORMULARY 1 each by Other route 4 (four) times daily. Accu-check multiclix lancets- use as directed   Yes Historical Provider, MD  ranitidine (ZANTAC) 300 MG tablet Take 1 tablet (300 mg total) by mouth at bedtime. 01/19/14  Yes Mosie Lukes, MD  Syringe, Disposable, 3 ML MISC DX 250.0  Use bid as directed 09/15/12  Yes Mosie Lukes, MD  aspirin 81 MG tablet Take 81 mg by mouth daily.    Historical Provider, MD   No Known Allergies  FAMILY HISTORY:  indicated that his mother is deceased. He indicated that his father is deceased. He indicated that all of his three brothers are alive. He indicated that his maternal grandmother is deceased. He indicated that his maternal grandfather is deceased. He indicated that his paternal grandmother is deceased. He indicated that his paternal grandfather is deceased. He indicated that his son is alive.  SOCIAL HISTORY:  reports that he quit smoking about 38 years ago. His smoking use included Cigarettes. He has a 9 pack-year smoking history. He has never used smokeless tobacco. He reports that he does not drink alcohol or use illicit drugs.  REVIEW OF SYSTEMS:  As per HPI - All other systems reviewed and were neg.   SUBJECTIVE:   VITAL SIGNS: Temp:  [97.5 F (36.4 C)-98.3 F (36.8 C)] 98.3 F (36.8 C) (12/10 1052) Pulse Rate:  [90-102] 95 (12/10 1029) Resp:  [15-25] 21 (12/10 1029) BP: (127-166)/(47-105) 127/47 mmHg (12/10 1031) SpO2:  [78 %-97 %] 92 % (12/10 1029) FiO2 (%):  [100 %] 100 % (12/10 1050) Weight:  [295 lb (133.811 kg)] 295 lb (133.811 kg) (12/10 0748) HEMODYNAMICS:   VENTILATOR SETTINGS: Vent Mode:  [-]  FiO2 (%):  [100 %] 100 % INTAKE / OUTPUT: No intake or output data in the 24 hours ending 03/08/14 1054  PHYSICAL EXAMINATION: General:  Chronically ill appearing male, NAD on NRB in R decub  position  Neuro:  Drowsy post sedation but easily arousable, follows commands, appropriate, MAE  HEENT:  Mm dry, NRB, no JVD  Cardiovascular:  s1s2 rrr Lungs:  resps even, non labored on NRB, diminished R Abdomen:  Obese, soft, non tender,  Musculoskeletal:  Warm and dry, no sig edema   LABS:  CBC  Recent Labs Lab 03/08/14 0802  WBC 4.9  HGB 14.8  HCT 42.4  PLT 140*   Coag's  Recent Labs Lab 03/08/14 0802  APTT 28  INR 0.95   BMET No results for input(s): NA, K, CL, CO2, BUN, CREATININE, GLUCOSE in the last 168 hours. Electrolytes No results for input(s): CALCIUM, MG, PHOS in the last 168 hours. Sepsis Markers No results for input(s): LATICACIDVEN, PROCALCITON, O2SATVEN in the last 168 hours. ABG No results for input(s): PHART, PCO2ART, PO2ART in the last 168 hours. Liver Enzymes No results for input(s): AST, ALT, ALKPHOS, BILITOT, ALBUMIN in the last 168 hours. Cardiac Enzymes No results for input(s): TROPONINI, PROBNP in the last 168 hours.  Glucose  Recent Labs Lab 03/08/14 0748 03/08/14 1050  GLUCAP 218* 202*    Imaging No results found.   ASSESSMENT / PLAN:  PULMONARY Acute hypoxic respiratory failure - r/t pulmonary hemorrhage  Pulmonary hemorrhage - post needle bx RUL nodule.  Has slowed significantly,currently protecting airway.  Mult pulmonary nodules - 1.7 cm on the right and up to 2.9 cm in the lingula - hypermetabolic on PET.  Also ?recurrence of R submandibular area of metastatic basal cell.  P:   Admit ICU  Cont supplemental O2 F/u CXR in 3 hours and in am  Maintain R lateral decub position as tolerated  Low threshold for intubation if further hemorrhage  F/u path Will need further heme/onc input   CARDIOVASCULAR Hx HTN  P:  Hold home anti-htn for now   RENAL No active issue  P:   Check bmet   GASTROINTESTINAL No active issue  P:   PPI - home rx   HEMATOLOGIC Acute blood loss  P:  Serial cbc    INFECTIOUS No active  issue  P:   Monitor fever, wbc curve off abx   ENDOCRINE DM  Hypothyroid  P:   SSI Hold lantus for now while NPO - but takes LARGE doses at home so monitor closely  Synthroid   NEUROLOGIC No active issue  P:   PRN fentanyl for pain   FAMILY  - Updates:  Wife updated 12/10   Nickolas Madrid, NP 03/08/2014  10:54 AM Pager: [phone] or [phone]   Reviewed above, examined.  58 yo male with hx of metastatic basal cell carcinoma found to have have PET positive RUL nodule.  He was scheduled for bx with IR.  Developed hemorrhage with hypoxia after procedure.  He is maintaining his oxygenation and airway.  He has chest discomfort on Rt.  No further hemoptysis since initial event.    He has hx of OSA and uses CPAP at night, DM, HTN, HLD, GERD.  He will need close monitoring of his oxygenation and airway in ICU.  Will f/u CBC and CXR.  Hopefully will be able to avoid need for intubation.  D/w Dr. Earleen Newport and updated pt's wife at bedside.  CC time by me independent of APP time is 45 minutes.  Chesley Mires, MD [facility] Pulmonary/Critical Care 03/08/2014, 11:02 AM Pager:  [phone] After 3pm call: [phone]

## 2014-03-08 NOTE — Sedation Documentation (Signed)
Anesthesia in room to assess pt

## 2014-03-08 NOTE — Significant Event (Signed)
Rapid Response Event Note  Overview: Called by CT tech re:  Patient with distress post lung biopsy Time Called: 1000 Arrival Time: 1002 Event Type: Respiratory  Initial Focused Assessment:  On arrival patient supine in right decubitus position - warm and moist - arouses to name - nailbeds blue - oral mucosa blue - O2 sats 77% on NRB mask - moderate amount BRB being suctioned from airway - Dr. Earleen Newport  at [facility] - staff report patient here as outpatient with right lung biopsy - Dr. Earleen Newport reports successful biopsy - then had post-biopsy lung hemorrage confirmed with CT scan - states he had large bleed right lung  with initial scan - rescanned  - per Dr. Earleen Newport bleed not getting any larger.  Patient has good cough. Trachea midline - decreased BS right side -  Equal lung expansion -no crepitus.   BP 157/78  HR 105 ST RR 32 -   NS infusing.    Interventions:  Stat call to anesthesia for airway support as needed.  Stat call to PCCM on call.  2nd IV line started to [place].  Patient continued to be supported with NRB.  Call to patient placement for ICU bed. Dr. Earleen Newport talking with Dr. Halford Chessman - anesthesia CRNA present - updated on questionable need for intubation at this point - no more hemotpysis - patient more alert and talking - c/o only some pain with inspiration and states "I could use some more air".  Althia Forts NP with PCCM present.  Dr. Tobias Alexander present.  Dr. Halford Chessman present - decision to hold off on intubation at this point.  O2 sats remain 90-92% on NRB mask - decreased WOB at this point.  Spoke with RN Gerald Stabs and Elita Quick from 1M - report given.  Patient transferred to stretcher from CT table without incident for transport - HOB up - O2 sats climbing to 96% with HOB up.  Patient remains alert.  122/57 HR 87 RR 24 NRB mask.  Transported to 1M04 without incident.  Handoff to [employer] and [employer].  Event Summary: Name of Physician Notified: Dr. York Cerise Ward -  at  (present in room - procedure)  Name of Consulting  Physician Notified: Dr. Halford Chessman  PCCM at 1004  Outcome: Transferred (Comment) (outpatient procedure - tx to ICU)  Event End Time: 1045  Quin Hoop

## 2014-03-08 NOTE — Plan of Care (Signed)
Problem: Consults Goal: General Medical Patient Education See Patient Education Module for specific education. Outcome: Completed/Met Date Met:  03/08/14 Goal: Skin Care Protocol Initiated - if Braden Score 18 or less If consults are not indicated, leave blank or document N/A Outcome: Completed/Met Date Met:  03/08/14 Goal: Diabetes Guidelines if Diabetic/Glucose > 140 If diabetic or lab glucose is > 140 mg/dl - Initiate Diabetes/Hyperglycemia Guidelines & Document Interventions  Outcome: Completed/Met Date Met:  03/08/14  Problem: Phase I Progression Outcomes Goal: Pain controlled with appropriate interventions Outcome: Completed/Met Date Met:  03/08/14 Goal: Hemodynamically stable Outcome: Completed/Met Date Met:  03/08/14

## 2014-03-08 NOTE — Telephone Encounter (Signed)
That is correct I referred to pulmonary for them to order it but I guess now they are saying they cannot see him soon enough. I was saying see if someone in [facility] can see him but I will order sleep study and then he needs a pulmonary appt to follow it up as soon as possible

## 2014-03-08 NOTE — Progress Notes (Signed)
Referring Physician(s): Byers,John  Subjective:  OP Rt lung mass bx complicated by post procedure hemorrhage Now admitted to PCCM Pt sitting up in bed Only complaint is minimal chest pain with deep breath No hemoptysis since after procedure   Allergies: Review of patient's allergies indicates no known allergies.  Medications: Prior to Admission medications   Medication Sig Start Date End Date Taking? Authorizing Provider  ACCU-CHEK AVIVA PLUS test strip USE AS DIRECTED 02/06/14  Yes Mosie Lukes, MD  ACCU-CHEK FASTCLIX LANCETS MISC USE AS DIRECTED 02/06/14  Yes Mosie Lukes, MD  Ascorbic Acid (VITAMIN C) 1000 MG tablet Take 1,000 mg by mouth daily.   Yes Historical Provider, MD  cetirizine (ZYRTEC) 10 MG tablet Take 10 mg by mouth daily as needed for allergies.    Yes Historical Provider, MD  CRESTOR 40 MG tablet TAKE 1 TABLET (40 MG TOTAL) BY MOUTH DAILY. 12/07/13  Yes Mosie Lukes, MD  fenofibrate 160 MG tablet TAKE 1 TABLET (160 MG TOTAL) BY MOUTH DAILY. 12/07/13  Yes Mosie Lukes, MD  ibuprofen (ADVIL,MOTRIN) 200 MG tablet Take 600 mg by mouth every 6 (six) hours as needed for pain.    Yes Historical Provider, MD  insulin aspart (NOVOLOG) 100 UNIT/ML injection 15 units tid for high blood sugar. Test daily after dinner and see sliding scale for blood sugar  Please dispense the amount pt needs for 1 month 01/25/14  Yes Mosie Lukes, MD  Insulin Glargine (LANTUS SOLOSTAR) 100 UNIT/ML Solostar Pen 100 units SQ q am and 60 units SQ in pm 12/28/13  Yes Mosie Lukes, MD  Insulin Pen Needle 31G X 8 MM MISC Use bid prn  DX: E11.8 01/23/14  Yes Mosie Lukes, MD  Krill Oil 300 MG CAPS Take 1 capsule by mouth daily.   Yes Historical Provider, MD  levothyroxine (SYNTHROID, LEVOTHROID) 75 MCG tablet TAKE 1 TABLET (75 MCG TOTAL) BY MOUTH DAILY. 01/08/14  Yes Mosie Lukes, MD  losartan (COZAAR) 50 MG tablet Take 1 tablet (50 mg total) by mouth daily. 02/08/14  Yes Mosie Lukes, MD  metFORMIN (GLUCOPHAGE) 500 MG tablet 2 tabs po bid and 1 tab po q noon 01/04/14  Yes Mosie Lukes, MD  Misc Natural Products (OSTEO BI-FLEX TRIPLE STRENGTH PO) Take 2 tablets by mouth daily.   Yes Historical Provider, MD  NON FORMULARY 1 each by Other route 4 (four) times daily. Accu-check multiclix lancets- use as directed   Yes Historical Provider, MD  ranitidine (ZANTAC) 300 MG tablet Take 1 tablet (300 mg total) by mouth at bedtime. 01/19/14  Yes Mosie Lukes, MD  Syringe, Disposable, 3 ML MISC DX 250.0  Use bid as directed 09/15/12  Yes Mosie Lukes, MD  aspirin 81 MG tablet Take 81 mg by mouth daily.    Historical Provider, MD    Review of Systems  Vital Signs: BP 167/90 mmHg  Pulse 92  Temp(Src) 98.6 F (37 C) (Oral)  Resp 23  Ht 6\' 6"  (1.981 m)  Wt 134.9 kg (297 lb 6.4 oz)  BMI 34.38 kg/m2  SpO2 95%  Physical Exam  Constitutional:  pleasant  Pulmonary/Chest: Effort normal. He has wheezes.  Musculoskeletal:  Up in bed Moving all 4s  Nursing note and vitals reviewed.   Imaging: Ct Biopsy  03/08/2014   CLINICAL DATA:  58 year old male with a history of multiple lung nodules. He has been referred for percutaneous biopsy.  EXAM: CT GUIDED  CORE BIOPSY OF RIGHT UPPER LOBE LUNG NODULE  ANESTHESIA/SEDATION: 2.0  Mg IV Versed; 50 mcg IV Fentanyl  Total Moderate Sedation Time: 15 minutes.  PROCEDURE: The procedure risks, benefits, and alternatives were explained to the patient. Questions regarding the procedure were encouraged and answered. The patient understands and consents to the procedure.  Patient was placed in the supine position and CT scan of the chest was performed for planning purposes. Right upper lobe nodule was identified is the safest lesion for biopsy.  The right anterior chest was prepped with Betadinein a sterile fashion, and a sterile drape was applied covering the operative field. A sterile gown and sterile gloves were used for the procedure. Local  anesthesia was provided with 1% Lidocaine.  Once the patient is prepped and draped sterilely and the skin was infiltrated with 1% lidocaine for local anesthesia. A 17 gauge trocar needle was advanced in intercostal location to the right upper lobe nodule. The tip of the needle was identified on the proximal margin of the lesion with CT imaging.  Two separate 18 core biopsy were retrieved.  At this point, the patient developed hemoptysis. He was rapidly placed into a right decubitus position, and nasal cannulated was exchanged for a face mask with 15 L of oxygen.  A respiratory code was called, with assistance from critical care, anesthesia, and a rapid response team.  Sequential CT scans after the biopsy were performed in the supine position and also in right decubitus.  After a hemodynamics stable is a shin, the patient remained tachycardic at 104 beats per min. The patient was oxygen eating with a face massive 15 L approximately 95%. Blood pressure remained at 409 systolic. The patient was alert and appropriately answering questions.  Few transported from the CT scan of to the ICU for observation.  No significant blood loss was encountered.  Complications:  COMPLICATIONS: Hemoptysis with right pulmonary hemorrhage. Society of Interventional Radiology guidelines classified this as a class C complication, and given his admission to the ICU for observation.  FINDINGS: Initial CT in supine position demonstrates right upper lobe nodule measuring 17 mm, similar to the comparison CT and PET-CT.  After 2 separate 18 gauge biopsy, right upper lobe pulmonary hemorrhage is identified. No pneumothorax.  A sequential CT scan in the right decubitus position approximately 4 min after the first CT demonstrates similar volume of right upper lobe hemorrhage. The right decubitus study demonstrates trace pneumothorax in the in the dependent portion of the right thorax.  IMPRESSION: Status post right upper lobe nodule percutaneous  biopsy, with tissue specimen sent to pathology for complete histopathologic analysis.  Right-sided pulmonary hemorrhage with tiny right-sided pneumothorax.  Signed,  Dulcy Fanny. Earleen Newport, DO  [facility]  [facility]  PLAN: The patient was stabilized in the CT scanner be for transportation to the ICU for observation.  The ICU will be managing the patient's respiratory status, and at the time of transfer there was no need for intubation.  The need for follow-up chest x-ray was discussed given the risk of a growing pneumothorax.   Electronically Signed   By: Corrie Mckusick D.O.   On: 03/08/2014 12:40    Labs:  CBC:  Recent Labs  09/08/13 0936 12/19/13 0832 03/08/14 0802 03/08/14 1230  WBC 5.5 4.7 4.9 6.5  HGB 14.8 14.6 14.8 14.3  HCT 43.3 42.6 42.4 41.2  PLT 182 170.0 140* 150    COAGS:  Recent Labs  03/08/14 0802  INR 0.95  APTT 28    BMP:  Recent Labs  09/08/13 0936 12/19/13 0832 03/08/14 1230  NA 140 137 141  K 4.3 4.1 4.6  CL 103 103 103  CO2 26 29 25   GLUCOSE 213* 257* 229*  BUN 21 19 24*  CALCIUM 9.0 8.8 8.8  CREATININE 1.11 1.0 0.93  GFRNONAA  --   --  >90  GFRAA  --   --  >90    LIVER FUNCTION TESTS:  Recent Labs  09/08/13 0936 12/19/13 0832 03/08/14 1230  BILITOT 0.4 0.6 0.4  AST 14 16 13   ALT 16 24 18   ALKPHOS 44 51 45  PROT 6.0 6.6 6.2  ALBUMIN 4.1  4.1 3.9  3.9 3.5    Assessment and Plan:  Rt lung mass bx in IR today Post procedure hemorrhage Admitted to PCCM Will follow    I spent a total of 15 minutes face to face in clinical consultation/evaluation, greater than 50% of which was counseling/coordinating care for Lung mass bx  Signed: Woodley Petzold A 03/08/2014, 2:46 PM

## 2014-03-08 NOTE — Sedation Documentation (Signed)
Bleeding post- procedure, MD at bedside, Rapid Response   RN on scene, pt has post hemmorrhage; will continue monitoring- Respiratory called- to have airway in place if needed.

## 2014-03-08 NOTE — Progress Notes (Signed)
UR Completed.  Steven Ibarra [phone]

## 2014-03-08 NOTE — Sedation Documentation (Signed)
Sats 92% 100% NRB- pt still talking, alert. Explained to pt and wife events of what happened. Pt voices understanding

## 2014-03-08 NOTE — Sedation Documentation (Signed)
Sats dropping post-op, Rapid response called

## 2014-03-08 NOTE — Telephone Encounter (Signed)
I do not see where the patient is schedule for a sleep study or where you ordered one. Please advise

## 2014-03-08 NOTE — Telephone Encounter (Signed)
Jen--let me know what you need

## 2014-03-08 NOTE — Sedation Documentation (Signed)
MD talking top pts wife, pt awake and asking is he "doing all right". Pt awaiting bed assignment. BP stable, HR 98.

## 2014-03-08 NOTE — Progress Notes (Signed)
Rapid Response team called to CT 3 to see patient in distress. When we arrived, pateint was on 100% NRB with Yankauer in mouth, bleeding moderately. SAT was 88%. MD stated that he had had a pulmonary hemorrhage. We were concerned about his airway, CRNA paged. I went to grab vent from ED, CRNA and CCM arrived around the same time and decided to hold off on intubation. SAT 91% on NRB and patient starting to wake up. Transported to 11M on NRB, SAT 93% and stable throughout. RT will continue to monitor.

## 2014-03-08 NOTE — Sedation Documentation (Signed)
Moving pt to 71M bed.

## 2014-03-08 NOTE — Sedation Documentation (Signed)
2nd IV site placed- Will take to ICU to monitor pt overnight  post-procedure

## 2014-03-08 NOTE — Progress Notes (Signed)
   03/08/14 1103  Clinical Encounter Type  Visited With Family;Patient and family together;Health care provider  Visit Type Spiritual support  Referral From Nurse  Consult/Referral To Chaplain  Stress Factors  Patient Stress Factors Health changes  Family Stress Factors Health changes  Chaplain responded to referral from nurse that pt's spouse may need support. Pt had complications during procedure and was put on an ICU, which spouse was not expecting. Spouse called and asked one of her friends to come to hospital later for support. Chaplain walked with spouse to pt's room after nurse updated her, and chaplain provided prayer with pt and spouse. Page if needed.   Mariah Milling, Chaplain 03/08/2014 11:05 AM

## 2014-03-08 NOTE — H&P (Signed)
Chief Complaint: Hx basal cell ca New lung nodules: +PET  Referring Physician(s): Byers,John  History of Present Illness: Steven Ibarra is a 58 y.o. male   Known basal cell cancer 2013 Rt submandibular resection and radiation Routine follow up scans reveal new B lung nodules +PET Now scheduled for R lung mass biopsy  Past Medical History  Diagnosis Date  . Hyperlipidemia   . Obesity   . Overweight(278.02) 10/23/2009  . Mixed hyperlipidemia 10/23/2009  . ELEVATED BLOOD PRESSURE 04/23/2010  . DYSPNEA ON EXERTION 10/23/2009  . ALLERGIC RHINITIS, SEASONAL 10/23/2009  . OSA (obstructive sleep apnea)   . SLEEP APNEA, OBSTRUCTIVE 10/23/2009    Sleep study Done at [facility]  . Hearing loss   . Hypertension     Does not see a cardiologist, has not had a stress, echo   . GERD (gastroesophageal reflux disease)   . Diabetes mellitus approx 2003    type 2  . DIABETES MELLITUS, TYPE II 10/23/2009  . S/P radiation therapy 07/06/11 - 08/19/11    Right Facial and Right Neck Nodes and right Trigeminal  Nerve to Base of Skull/ Total Dose 6600 cGy/ 33 Fractions  . Preventative health care 09/30/2011  . Hypothyroid 01/28/2012  . Otitis externa of right ear 07/06/2012  . Pulled muscle 07/06/12  . Cancer   . Skin cancer     on nose, 9-10 yrs ago  . Basal cell carcinoma of neck 05/28/2011    r suprahyoid, radical neck dissection S/p 6 weeks of targeted radiation therapy   . Type II or unspecified type diabetes mellitus with unspecified complication, uncontrolled     type 2     Past Surgical History  Procedure Laterality Date  . Tonsillectomy and adenoidectomy    . Skin cancer removal    . Radical neck dissection  05/28/2011    Procedure: RADICAL NECK DISSECTION;  Surgeon: Melissa Montane, MD;  Location: [facility];  Service: ENT;  Laterality: N/A;  Suprahyoid Neck Dissection    Allergies: Review of patient's allergies indicates no known allergies.  Medications: Prior to Admission medications     Medication Sig Start Date End Date Taking? Authorizing Provider  ACCU-CHEK AVIVA PLUS test strip USE AS DIRECTED 02/06/14  Yes Mosie Lukes, MD  ACCU-CHEK FASTCLIX LANCETS MISC USE AS DIRECTED 02/06/14  Yes Mosie Lukes, MD  Ascorbic Acid (VITAMIN C) 1000 MG tablet Take 1,000 mg by mouth daily.   Yes Historical Provider, MD  cetirizine (ZYRTEC) 10 MG tablet Take 10 mg by mouth daily as needed for allergies.    Yes Historical Provider, MD  CRESTOR 40 MG tablet TAKE 1 TABLET (40 MG TOTAL) BY MOUTH DAILY. 12/07/13  Yes Mosie Lukes, MD  fenofibrate 160 MG tablet TAKE 1 TABLET (160 MG TOTAL) BY MOUTH DAILY. 12/07/13  Yes Mosie Lukes, MD  ibuprofen (ADVIL,MOTRIN) 200 MG tablet Take 600 mg by mouth every 6 (six) hours as needed for pain.    Yes Historical Provider, MD  insulin aspart (NOVOLOG) 100 UNIT/ML injection 15 units tid for high blood sugar. Test daily after dinner and see sliding scale for blood sugar  Please dispense the amount pt needs for 1 month 01/25/14  Yes Mosie Lukes, MD  Insulin Glargine (LANTUS SOLOSTAR) 100 UNIT/ML Solostar Pen 100 units SQ q am and 60 units SQ in pm 12/28/13  Yes Mosie Lukes, MD  Insulin Pen Needle 31G X 8 MM MISC Use bid prn  DX: E11.8  01/23/14  Yes Mosie Lukes, MD  Krill Oil 300 MG CAPS Take 1 capsule by mouth daily.   Yes Historical Provider, MD  levothyroxine (SYNTHROID, LEVOTHROID) 75 MCG tablet TAKE 1 TABLET (75 MCG TOTAL) BY MOUTH DAILY. 01/08/14  Yes Mosie Lukes, MD  losartan (COZAAR) 50 MG tablet Take 1 tablet (50 mg total) by mouth daily. 02/08/14  Yes Mosie Lukes, MD  metFORMIN (GLUCOPHAGE) 500 MG tablet 2 tabs po bid and 1 tab po q noon 01/04/14  Yes Mosie Lukes, MD  Misc Natural Products (OSTEO BI-FLEX TRIPLE STRENGTH PO) Take 2 tablets by mouth daily.   Yes Historical Provider, MD  NON FORMULARY 1 each by Other route 4 (four) times daily. Accu-check multiclix lancets- use as directed   Yes Historical Provider, MD  ranitidine  (ZANTAC) 300 MG tablet Take 1 tablet (300 mg total) by mouth at bedtime. 01/19/14  Yes Mosie Lukes, MD  Syringe, Disposable, 3 ML MISC DX 250.0  Use bid as directed 09/15/12  Yes Mosie Lukes, MD  aspirin 81 MG tablet Take 81 mg by mouth daily.    Historical Provider, MD    Family History  Problem Relation Age of Onset  . Other Mother     CHF  . Stroke Father   . Hyperlipidemia Father   . Heart disease Father     s/p valve replacement  . Hyperlipidemia Brother   . Obesity Brother   . Other Brother     Back pain  . Hyperlipidemia Brother   . Hypertension Brother   . Other Brother     Panic attacks  . Hyperlipidemia Brother   . Anesthesia problems Neg Hx     History   Social History  . Marital Status: Married    Spouse Name: N/A    Number of Children: N/A  . Years of Education: N/A   Social History Main Topics  . Smoking status: Former Smoker -- 1.00 packs/day for 9 years    Types: Cigarettes    Quit date: 03/31/1975  . Smokeless tobacco: Never Used  . Alcohol Use: No     Comment: 4 beers a month, 06/12/11 rarely uses now  . Drug Use: No  . Sexual Activity: Yes   Other Topics Concern  . None   Social History Narrative   Patient is married.   Patient with a history of smoking one pack per day for approximately 29 years from ages of [age]. Patient denies ever having used smokeless tobacco. Patient with rare use of alcohol.      Mother died at age 68 from congestive heart failure complications. Father died at the age of 38 secondary to a stroke.     Review of Systems: A 12 point ROS discussed and pertinent positives are indicated in the HPI above.  All other systems are negative.  Review of Systems  Constitutional: Negative for fever, activity change, appetite change, fatigue and unexpected weight change.  Respiratory: Negative for cough and shortness of breath.   Gastrointestinal: Negative for nausea and abdominal pain.  Genitourinary: Negative for  difficulty urinating.  Musculoskeletal: Negative for back pain.  Neurological: Negative for weakness.  Psychiatric/Behavioral: Negative for behavioral problems and confusion.    Vital Signs: BP 143/85 mmHg  Pulse 94  Temp(Src) 97.5 F (36.4 C) (Oral)  Resp 18  Ht 6\' 6"  (1.981 m)  Wt 133.811 kg (295 lb)  BMI 34.10 kg/m2  SpO2 96%  Physical Exam  Constitutional: He  is oriented to person, place, and time.  Cardiovascular: Normal rate, regular rhythm and normal heart sounds.   No murmur heard. Pulmonary/Chest: Effort normal and breath sounds normal. He has no wheezes.  Abdominal: Soft. Bowel sounds are normal. There is no tenderness.  Musculoskeletal: Normal range of motion.  Neurological: He is alert and oriented to person, place, and time.  Skin: Skin is warm and dry.  Psychiatric: He has a normal mood and affect. His behavior is normal. Judgment and thought content normal.    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 Prior right submandibular gland resection with surgery and radiation in 2013.  EXAM: NUCLEAR MEDICINE PET SKULL BASE TO THIGH  TECHNIQUE: 14.9 mCi F-18 FDG was injected intravenously. Full-ring PET imaging was performed from the skull base to thigh after the radiotracer. CT data was obtained and used for attenuation correction and anatomic localization.  FASTING BLOOD GLUCOSE:  Value: 193 mg/dl  COMPARISON:  Multiple exams, including 05/04/2011  FINDINGS: NECK  Faint focal activity at the resection site of the right submandibular gland, maximum standard uptake value 3.2 as compared to contralateral side 2.5. Polypoid mucoperiosteal thickening in both maxillary sinuses.  CHEST  1.7 cm right upper lobe pulmonary nodule maximum standard uptake value 5.8. Lingular nodule 2.9 by 2.6 cm, maximum standard  uptake value 3.7. There is some smaller nodules in the lingula adjacent to the diaphragm been pleural margin and which are technically too small to characterize. Mild atelectasis in the right lower lobe. Coronary artery atherosclerosis. Mitral valve calcification.  ABDOMEN/PELVIS  Left adrenal nodule 2.1 by 1.5 cm, internal density 23 Hounsfield units, maximum standard uptake value 3.7. Previous size on 05/04/2011 by my measurements was 2.0 by 1.6 cm.  Photopenic exophytic cyst, right kidney lower pole.  SKELETON  No focal hypermetabolic activity to suggest skeletal metastasis. Bone island, right femoral head.  IMPRESSION: 1. Single bilateral upper lobe hypermetabolic pulmonary nodules, measuring 1.7 cm on the right and up to 2.9 cm in the lingula, suspicious for pulmonary metastatic disease. There is some faint nodularity adjacent to the lingular nodule which could represent satellite lesions or inflammatory lesions. 2. No other compelling findings of metastatic disease. Faint focal activity in the right submandibular resection site is likely postoperative. A left adrenal nodule is borderline hypermetabolic but is not changed in size compared to 2013 and accordingly is probably benign. 3. Ancillary findings include chronic bilateral maxillary sinusitis, coronary artery atherosclerosis, and mitral valve calcification.   Electronically Signed   By: Sherryl Barters M.D.   On: 02/12/2014 09:09    Labs:  CBC:  Recent Labs  09/08/13 0936 12/19/13 0832 03/08/14 0802  WBC 5.5 4.7 4.9  HGB 14.8 14.6 14.8  HCT 43.3 42.6 42.4  PLT 182 170.0 140*    COAGS:  Recent Labs  03/08/14 0802  INR 0.95  APTT 28    BMP:  Recent Labs  09/08/13 0936 12/19/13 0832  NA 140 137  K 4.3 4.1  CL 103 103  CO2 26 29  GLUCOSE 213* 257*  BUN 21 19  CALCIUM 9.0 8.8  CREATININE 1.11 1.0    LIVER FUNCTION TESTS:  Recent Labs  09/08/13 0936 12/19/13 0832  BILITOT 0.4 0.6  AST 14 16  ALT 16 24    ALKPHOS 44 51  PROT 6.0 6.6  ALBUMIN  4.1  4.1 3.9  3.9    TUMOR MARKERS: No results for input(s): AFPTM, CEA, CA199, CHROMGRNA in the last 8760 hours.  Assessment and Plan:  Hc basal cell ca Rt submandibular resection/radiation 2013 New B lung nodules +PET Now for bx Rt lung mass Pt awatre of procedure benefits and risks and agreeable to proceed Consent signed andin chart  Thank you for this interesting consult.  I greatly enjoyed meeting ELAD MACPHAIL and look forward to participating in their care.    I spent a total of 20 minutes face to face in clinical consultation, greater than 50% of which was counseling/coordinating care for R lung mass bx  Signed: Aaliayah Miao A 03/08/2014, 8:54 AM

## 2014-03-08 NOTE — Progress Notes (Signed)
NP paul stated too hold off on cpap for tonight. RT to just monitor pt. On nasal cannula, due to pt.noted for Having hemorrhage earlier today. Pt. Currently sating 98% on 4L. Tolerating well at this time.

## 2014-03-08 NOTE — Progress Notes (Signed)
58 yo male, SP RUL nodule CT-guided biopsy, with bx-related hemorrhage. Acute desaturation with hemoptysis during procedure.  ICU admission for observation.   Currently patient is improving, with mid-90% oxygen saturation on 4L nasal cannula.  Improved from low 90 saturation on facemask with 15L at completion of procedure.  HR = 90, improved, with stable BP.   Agree with current plan of care.   Review of the CT-guided biopsy final images shows tiny right sided pneumothorax.  Discussed this result with the responsible nursing staff at 2:10pm. A portable CXR is pending at this time.    Family at bedside during interview and discussion of plan of care.   Will follow.   Call with questions or concerns.   Signed,  Dulcy Fanny. Earleen Newport, DO

## 2014-03-08 NOTE — Procedures (Signed)
Interventional Radiology Procedure Note  Procedure: CT guided lung biopsy (Right upper lobe nodule).   Findings:  2 x 18 core biopsy.  Complications: Pulmonary hemorrhage.  Admission to ICU for respiratory management.  Disposition:  Stable vital signs on transfer.  Wife is aware.  100 HR, Ox Sat 92% on facemask, BP is 161 systolic Recommendations:  - Care per ICU - follow up biopsy result   Signed,  Dulcy Fanny. Earleen Newport, DO

## 2014-03-09 ENCOUNTER — Inpatient Hospital Stay (HOSPITAL_COMMUNITY): Payer: BC Managed Care – PPO

## 2014-03-09 DIAGNOSIS — C3411 Malignant neoplasm of upper lobe, right bronchus or lung: Secondary | ICD-10-CM | POA: Diagnosis not present

## 2014-03-09 DIAGNOSIS — G4733 Obstructive sleep apnea (adult) (pediatric): Secondary | ICD-10-CM

## 2014-03-09 LAB — BASIC METABOLIC PANEL
Anion gap: 12 (ref 5–15)
BUN: 22 mg/dL (ref 6–23)
CHLORIDE: 101 meq/L (ref 96–112)
CO2: 25 meq/L (ref 19–32)
Calcium: 8.6 mg/dL (ref 8.4–10.5)
Creatinine, Ser: 0.87 mg/dL (ref 0.50–1.35)
GFR calc Af Amer: >90 mL/min (ref >90)
GFR calc non Af Amer: >90 mL/min (ref >90)
Glucose, Bld: 184 mg/dL — ABNORMAL HIGH (ref 70–99)
POTASSIUM: 3.9 meq/L (ref 3.7–5.3)
Sodium: 138 mEq/L (ref 137–147)

## 2014-03-09 LAB — GLUCOSE, CAPILLARY
GLUCOSE-CAPILLARY: 171 mg/dL — AB (ref 70–99)
GLUCOSE-CAPILLARY: 207 mg/dL — AB (ref 70–99)
Glucose-Capillary: 169 mg/dL — ABNORMAL HIGH (ref 70–99)

## 2014-03-09 NOTE — Discharge Summary (Signed)
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  Routine f/u scans revealed new bilat lung nodules and pt presented 12/10 for elective IR needle bx of RUL nodule.  Good bx specimens were collected but post procedure pt developed pulmonary hemorrhage and significant hypoxia and PCCM called to admit. He was followed in the ICU overnight. CXR's on 12/10 and 12/11 showed evidence of some pulmonary hemorrhage but no pneumothorax. His CPAP was not used 12/10 pm in order to avoid causing a pneumothorax. On 12/11 am he was stable, denied CP or hemoptysis. He was deemed stable for d/c to home 12/11 on his usual medications. He was told that he could restart his CPAP on the evening of 12/11. He needs a sleep study this year and then F/U to be seen by Pulmonary as an outpatient, being arranged by Dr Randel Pigg - will work on expediting this.   SIGNIFICANT EVENTS: 12/10 CT guided needle bx, pulmonary hemorrhage   SUBJECTIVE:  Denies CP, has not had hemoptysis Cough is a bit better  VITAL SIGNS: Temp:  [97.6 F (36.4 C)-98.6 F (37 C)] 97.6 F (36.4 C) (12/11 0740) Pulse Rate:  [78-102] 84 (12/11 0800) Resp:  [8-28] 10 (12/11 0800) BP: (114-184)/(47-114) 155/88 mmHg (12/11 0800) SpO2:  [78 %-100 %] 93 % (12/11 0800) FiO2 (%):  [100 %] 100 % (12/10 1050) Weight:  [132.7 kg (292 lb 8.8 oz)-134.9 kg (297 lb 6.4 oz)] 132.7 kg (292 lb 8.8 oz) (12/11 0500) HEMODYNAMICS:   VENTILATOR SETTINGS: Vent Mode:  [-]  FiO2 (%):  [100 %] 100 % INTAKE / OUTPUT:  Intake/Output Summary (Last 24 hours) at 03/09/14 0841 Last data filed at 03/09/14 0800  Gross per 24 hour  Intake 1160.5  ml  Output    550 ml  Net  610.5 ml    PHYSICAL EXAMINATION: General:  Comfortable, NAD on [place] O2 Neuro:  Awake, alert, non-focal HEENT:  Mm dry, NRB, no JVD  Cardiovascular:  s1s2 rrr Lungs:  resps even, non labored on NRB, diminished R Abdomen:  Obese, soft, non tender,  Musculoskeletal:  Warm and dry, no sig edema   LABS:  CBC  Recent Labs Lab 03/08/14 1230 03/08/14 1600 03/08/14 2229  WBC 6.5 7.8 6.0  HGB 14.3 14.3 13.6  HCT 41.2 40.7 39.3  PLT 150 167 159   Coag's  Recent Labs Lab 03/08/14 0802  APTT 28  INR 0.95   BMET  Recent Labs Lab 03/08/14 1230 03/09/14 0243  NA 141 138  K 4.6 3.9  CL 103 101  CO2 25 25  BUN 24* 22  CREATININE 0.93 0.87  GLUCOSE 229* 184*   Electrolytes  Recent Labs Lab 03/08/14 1230 03/09/14 0243  CALCIUM 8.8 8.6   Sepsis Markers No results for input(s): LATICACIDVEN, PROCALCITON, O2SATVEN in the last 168 hours. ABG No results for input(s): PHART, PCO2ART, PO2ART in the last 168 hours. Liver Enzymes  Recent Labs Lab 03/08/14 1230  AST 13  ALT 18  ALKPHOS 45  BILITOT 0.4  ALBUMIN 3.5   Cardiac Enzymes No results for input(s): TROPONINI, PROBNP in the last 168 hours. Glucose  Recent Labs Lab 03/08/14 1230 03/08/14 1550 03/08/14 1927 03/08/14 2314 03/09/14 0329 03/09/14 0738  GLUCAP 220* 162* 214* 166* 169* 171*    Imaging Ct Biopsy  03/08/2014   CLINICAL DATA:  58 year old male with a history of multiple lung nodules. He has been referred for percutaneous biopsy.  EXAM: CT GUIDED CORE BIOPSY OF RIGHT UPPER LOBE LUNG NODULE  ANESTHESIA/SEDATION: 2.0  Mg IV Versed; 50 mcg IV Fentanyl  Total Moderate Sedation Time: 15 minutes.  PROCEDURE: The procedure risks, benefits, and alternatives were explained to the patient. Questions regarding the procedure were encouraged and answered. The patient understands and consents to the procedure.  Patient was placed in the supine position and CT scan of the chest was  performed for planning purposes. Right upper lobe nodule was identified is the safest lesion for biopsy.  The right anterior chest was prepped with Betadinein a sterile fashion, and a sterile drape was applied covering the operative field. A sterile gown and sterile gloves were used for the procedure. Local anesthesia was provided with 1% Lidocaine.  Once the patient is prepped and draped sterilely and the skin was infiltrated with 1% lidocaine for local anesthesia. A 17 gauge trocar needle was advanced in intercostal location to the right upper lobe nodule. The tip of the needle was identified on the proximal margin of the lesion with CT imaging.  Two separate 18 core biopsy were retrieved.  At this point, the patient developed hemoptysis. He was rapidly placed into a right decubitus position, and nasal cannulated was exchanged for a face mask with 15 L of oxygen.  A respiratory code was called, with assistance from critical care, anesthesia, and a rapid response team.  Sequential CT scans after the biopsy were performed in the supine position and also in right decubitus.  After a hemodynamics stable is a shin, the patient remained tachycardic at 104 beats per min. The patient was oxygen eating with a face massive 15 L approximately 95%. Blood pressure remained at 025 systolic. The patient was alert and appropriately answering questions.  Few transported from the CT scan of to the ICU for observation.  No significant blood loss was encountered.  Complications:  COMPLICATIONS: Hemoptysis with right pulmonary hemorrhage. Society of Interventional Radiology guidelines classified this as a class C complication, and given his admission to the ICU for observation.  FINDINGS: Initial CT in supine position demonstrates right upper lobe nodule measuring 17 mm, similar to the comparison CT and PET-CT.  After 2 separate 18 gauge biopsy, right upper lobe pulmonary hemorrhage is identified. No pneumothorax.  A sequential CT  scan in the right decubitus position approximately 4 min after the first CT demonstrates similar volume of right upper lobe hemorrhage. The right decubitus study demonstrates trace pneumothorax in the in the dependent portion of the right thorax.  IMPRESSION: Status post right upper lobe nodule percutaneous biopsy, with tissue specimen sent to pathology for complete histopathologic analysis.  Right-sided pulmonary hemorrhage with tiny right-sided pneumothorax.  Signed,  Dulcy Fanny. Earleen Newport, DO  [facility]  [facility]  PLAN: The patient was stabilized in the CT scanner be for transportation to the ICU for observation.  The ICU will be managing the patient's respiratory status, and at the time of transfer there was no need for intubation.  The need for follow-up chest x-ray was discussed given the risk of a growing pneumothorax.   Electronically Signed   By: Corrie Mckusick D.O.   On: 03/08/2014  12:40   Dg Chest Port 1 View  03/08/2014   CLINICAL DATA:  Needle biopsy today.  RIGHT middle Lung biopsy.  EXAM: PORTABLE CHEST - 1 VIEW  COMPARISON:  Chest CT from biopsy today.  FINDINGS: Inferior RIGHT upper lobe opacification is present compatible with hemorrhage associated with needle biopsy. No pneumothorax. Lingular nodule not visible. Cardiopericardial silhouette within normal limits.  IMPRESSION: No pneumothorax status post RIGHT upper lobe nodule biopsy. Airspace disease in the inferior RIGHT upper lobe compatible with hemorrhage.   Electronically Signed   By: Dereck Ligas M.D.   On: 03/08/2014 15:17     ASSESSMENT / PLAN:  PULMONARY Acute hypoxic respiratory failure - r/t pulmonary hemorrhage, improved Pulmonary hemorrhage - post needle bx RUL nodule.  Has slowed significantly,currently protecting airway.  Mult pulmonary nodules - 1.7 cm on the right and up to 2.9 cm in the lingula - hypermetabolic on PET.  Also ?recurrence of R submandibular area of  metastatic basal cell.  OSA on home CPAP P:   Wean O2 to off Plan for D/c to home today OK to restart his CPAP tonight 12/11 at home He needs a PSG and Sleep medicine visit this year to avoid losing his (old) CPAP > will order a home study (since the inpatient sleep lab schedule is backed up). He will be called next week with details.  F/u path Will need further heme/onc input as an outpt  CARDIOVASCULAR Hx HTN  P:  Restart home HTN regimen at discharge   HEMATOLOGIC Acute blood loss, Hgb stable P:  No new interventions, no transfusion   INFECTIOUS No active issue  P:   Monitor fever, wbc curve off abx   ENDOCRINE DM  Hypothyroid  P:   SSI Restart lantus dose when d/c to home - he is taking and tolerating PO Synthroid   NEUROLOGIC No active issue  P:   PRN fentanyl for pain while admitted  FAMILY  - Updates:  Wife updated 12/10, patient Updated 12/11   FOLLOW UP:  - with his PCP Dr Randel Pigg in ~ 1 week - with Dr Janace Hoard in ENT in ~ 1 week - for Sleep Study  > we will arrange for a home Sleep Study through [facility]. He will be contacted next week by the Sleep Center to give details.    Baltazar Apo, MD, PhD 03/09/2014, 9:15 AM [facility] [phone] or if no answer [phone]

## 2014-03-09 NOTE — Telephone Encounter (Signed)
Awaiting prior auth for sleep Study

## 2014-03-09 NOTE — Progress Notes (Signed)
Discharge instructions reviewed with patient. All questions answered and patient verbalized understanding. Pt discharged home with his wife.

## 2014-03-09 NOTE — Progress Notes (Signed)
Pt's belonging including his glasses are returned to him.

## 2014-03-09 NOTE — Progress Notes (Signed)
Referring Physician(s): Byers,John  Subjective:  Rt lung mass bx 12/10 Post pulmonary hemorrhage Admitted to PCCM Doing well Slept well Breathing deeper per pt No hemoptysis CXR this am improved  Allergies: Review of patient's allergies indicates no known allergies.  Medications: Prior to Admission medications   Medication Sig Start Date End Date Taking? Authorizing Provider  ACCU-CHEK AVIVA PLUS test strip USE AS DIRECTED 02/06/14  Yes Mosie Lukes, MD  ACCU-CHEK FASTCLIX LANCETS MISC USE AS DIRECTED 02/06/14  Yes Mosie Lukes, MD  Ascorbic Acid (VITAMIN C) 1000 MG tablet Take 1,000 mg by mouth daily.   Yes Historical Provider, MD  cetirizine (ZYRTEC) 10 MG tablet Take 10 mg by mouth daily as needed for allergies.    Yes Historical Provider, MD  CRESTOR 40 MG tablet TAKE 1 TABLET (40 MG TOTAL) BY MOUTH DAILY. 12/07/13  Yes Mosie Lukes, MD  fenofibrate 160 MG tablet TAKE 1 TABLET (160 MG TOTAL) BY MOUTH DAILY. 12/07/13  Yes Mosie Lukes, MD  ibuprofen (ADVIL,MOTRIN) 200 MG tablet Take 600 mg by mouth every 6 (six) hours as needed for pain.    Yes Historical Provider, MD  insulin aspart (NOVOLOG) 100 UNIT/ML injection 15 units tid for high blood sugar. Test daily after dinner and see sliding scale for blood sugar  Please dispense the amount pt needs for 1 month 01/25/14  Yes Mosie Lukes, MD  Insulin Glargine (LANTUS SOLOSTAR) 100 UNIT/ML Solostar Pen 100 units SQ q am and 60 units SQ in pm 12/28/13  Yes Mosie Lukes, MD  Insulin Pen Needle 31G X 8 MM MISC Use bid prn  DX: E11.8 01/23/14  Yes Mosie Lukes, MD  Krill Oil 300 MG CAPS Take 1 capsule by mouth daily.   Yes Historical Provider, MD  levothyroxine (SYNTHROID, LEVOTHROID) 75 MCG tablet TAKE 1 TABLET (75 MCG TOTAL) BY MOUTH DAILY. 01/08/14  Yes Mosie Lukes, MD  losartan (COZAAR) 50 MG tablet Take 1 tablet (50 mg total) by mouth daily. 02/08/14  Yes Mosie Lukes, MD  metFORMIN (GLUCOPHAGE) 500 MG tablet  2 tabs po bid and 1 tab po q noon 01/04/14  Yes Mosie Lukes, MD  Misc Natural Products (OSTEO BI-FLEX TRIPLE STRENGTH PO) Take 2 tablets by mouth daily.   Yes Historical Provider, MD  NON FORMULARY 1 each by Other route 4 (four) times daily. Accu-check multiclix lancets- use as directed   Yes Historical Provider, MD  ranitidine (ZANTAC) 300 MG tablet Take 1 tablet (300 mg total) by mouth at bedtime. 01/19/14  Yes Mosie Lukes, MD  Syringe, Disposable, 3 ML MISC DX 250.0  Use bid as directed 09/15/12  Yes Mosie Lukes, MD  aspirin 81 MG tablet Take 81 mg by mouth daily.    Historical Provider, MD    Review of Systems  Vital Signs: BP 155/88 mmHg  Pulse 84  Temp(Src) 97.6 F (36.4 C) (Oral)  Resp 10  Ht 6\' 6"  (1.981 m)  Wt 132.7 kg (292 lb 8.8 oz)  BMI 33.81 kg/m2  SpO2 93%  Physical Exam  Constitutional:  Pleasant Up in bed  Pulmonary/Chest: He is in respiratory distress. He has wheezes.    Imaging: Ct Biopsy  03/08/2014   CLINICAL DATA:  58 year old male with a history of multiple lung nodules. He has been referred for percutaneous biopsy.  EXAM: CT GUIDED CORE BIOPSY OF RIGHT UPPER LOBE LUNG NODULE  ANESTHESIA/SEDATION: 2.0  Mg IV Versed; 50 mcg  IV Fentanyl  Total Moderate Sedation Time: 15 minutes.  PROCEDURE: The procedure risks, benefits, and alternatives were explained to the patient. Questions regarding the procedure were encouraged and answered. The patient understands and consents to the procedure.  Patient was placed in the supine position and CT scan of the chest was performed for planning purposes. Right upper lobe nodule was identified is the safest lesion for biopsy.  The right anterior chest was prepped with Betadinein a sterile fashion, and a sterile drape was applied covering the operative field. A sterile gown and sterile gloves were used for the procedure. Local anesthesia was provided with 1% Lidocaine.  Once the patient is prepped and draped sterilely and the  skin was infiltrated with 1% lidocaine for local anesthesia. A 17 gauge trocar needle was advanced in intercostal location to the right upper lobe nodule. The tip of the needle was identified on the proximal margin of the lesion with CT imaging.  Two separate 18 core biopsy were retrieved.  At this point, the patient developed hemoptysis. He was rapidly placed into a right decubitus position, and nasal cannulated was exchanged for a face mask with 15 L of oxygen.  A respiratory code was called, with assistance from critical care, anesthesia, and a rapid response team.  Sequential CT scans after the biopsy were performed in the supine position and also in right decubitus.  After a hemodynamics stable is a shin, the patient remained tachycardic at 104 beats per min. The patient was oxygen eating with a face massive 15 L approximately 95%. Blood pressure remained at 409 systolic. The patient was alert and appropriately answering questions.  Few transported from the CT scan of to the ICU for observation.  No significant blood loss was encountered.  Complications:  COMPLICATIONS: Hemoptysis with right pulmonary hemorrhage. Society of Interventional Radiology guidelines classified this as a class C complication, and given his admission to the ICU for observation.  FINDINGS: Initial CT in supine position demonstrates right upper lobe nodule measuring 17 mm, similar to the comparison CT and PET-CT.  After 2 separate 18 gauge biopsy, right upper lobe pulmonary hemorrhage is identified. No pneumothorax.  A sequential CT scan in the right decubitus position approximately 4 min after the first CT demonstrates similar volume of right upper lobe hemorrhage. The right decubitus study demonstrates trace pneumothorax in the in the dependent portion of the right thorax.  IMPRESSION: Status post right upper lobe nodule percutaneous biopsy, with tissue specimen sent to pathology for complete histopathologic analysis.  Right-sided  pulmonary hemorrhage with tiny right-sided pneumothorax.  Signed,  Dulcy Fanny. Earleen Newport, DO  [facility]  [facility]  PLAN: The patient was stabilized in the CT scanner be for transportation to the ICU for observation.  The ICU will be managing the patient's respiratory status, and at the time of transfer there was no need for intubation.  The need for follow-up chest x-ray was discussed given the risk of a growing pneumothorax.   Electronically Signed   By: Corrie Mckusick D.O.   On: 03/08/2014 12:40   Dg Chest Port 1 View  03/09/2014   CLINICAL DATA:  Pulmonary hemorrhage.  EXAM: PORTABLE CHEST - 1 VIEW  COMPARISON:  Pulmonary hemorrhage following biopsy  FINDINGS: The heart size is normal. Lung volumes remain low. There is slight decrease and the pulmonary hemorrhage in the right upper lobe. Left basilar airspace disease has increased, likely atelectasis.  IMPRESSION: 1. Improving opacity in the right upper lobe  compatible with known pulmonary hemorrhage. 2. Low lung volumes with increased left basilar airspace disease, likely atelectasis. Early infection or aspiration are also considered.   Electronically Signed   By: Lawrence Santiago M.D.   On: 03/09/2014 07:50   Dg Chest Port 1 View  03/08/2014   CLINICAL DATA:  Needle biopsy today.  RIGHT middle Lung biopsy.  EXAM: PORTABLE CHEST - 1 VIEW  COMPARISON:  Chest CT from biopsy today.  FINDINGS: Inferior RIGHT upper lobe opacification is present compatible with hemorrhage associated with needle biopsy. No pneumothorax. Lingular nodule not visible. Cardiopericardial silhouette within normal limits.  IMPRESSION: No pneumothorax status post RIGHT upper lobe nodule biopsy. Airspace disease in the inferior RIGHT upper lobe compatible with hemorrhage.   Electronically Signed   By: Dereck Ligas M.D.   On: 03/08/2014 15:17    Labs:  CBC:  Recent Labs  03/08/14 0802 03/08/14 1230 03/08/14 1600 03/08/14 2229    WBC 4.9 6.5 7.8 6.0  HGB 14.8 14.3 14.3 13.6  HCT 42.4 41.2 40.7 39.3  PLT 140* 150 167 159    COAGS:  Recent Labs  03/08/14 0802  INR 0.95  APTT 28    BMP:  Recent Labs  09/08/13 0936 12/19/13 0832 03/08/14 1230 03/09/14 0243  NA 140 137 141 138  K 4.3 4.1 4.6 3.9  CL 103 103 103 101  CO2 26 29 25 25   GLUCOSE 213* 257* 229* 184*  BUN 21 19 24* 22  CALCIUM 9.0 8.8 8.8 8.6  CREATININE 1.11 1.0 0.93 0.87  GFRNONAA  --   --  >90 >90  GFRAA  --   --  >90 >90    LIVER FUNCTION TESTS:  Recent Labs  09/08/13 0936 12/19/13 0832 03/08/14 1230  BILITOT 0.4 0.6 0.4  AST 14 16 13   ALT 16 24 18   ALKPHOS 44 51 45  PROT 6.0 6.6 6.2  ALBUMIN 4.1  4.1 3.9  3.9 3.5    Assessment and Plan:  Rt Lung mass Bx 12/10 as OP Post bx hemorrhage Admitted to PCCM Doing well Will follow Plan per PCCM    I spent a total of 15 minutes face to face in clinical consultation/evaluation, greater than 50% of which was counseling/coordinating care for Rt lung mass bx- post proced hemorrhage  Signed: Jamesina Gaugh A 03/09/2014, 8:17 AM

## 2014-03-13 ENCOUNTER — Other Ambulatory Visit: Payer: Self-pay | Admitting: Pulmonary Disease

## 2014-03-13 DIAGNOSIS — G4733 Obstructive sleep apnea (adult) (pediatric): Secondary | ICD-10-CM

## 2014-03-15 ENCOUNTER — Other Ambulatory Visit: Payer: Self-pay | Admitting: Family Medicine

## 2014-03-16 ENCOUNTER — Ambulatory Visit (INDEPENDENT_AMBULATORY_CARE_PROVIDER_SITE_OTHER): Payer: BC Managed Care – PPO | Admitting: Medical

## 2014-03-16 ENCOUNTER — Encounter: Payer: Self-pay | Admitting: Medical

## 2014-03-16 ENCOUNTER — Ambulatory Visit (HOSPITAL_BASED_OUTPATIENT_CLINIC_OR_DEPARTMENT_OTHER)
Admission: RE | Admit: 2014-03-16 | Discharge: 2014-03-16 | Disposition: A | Discharge status: Home / Self Care | Payer: BC Managed Care – PPO | Source: Ambulatory Visit | Attending: Medical | Admitting: Medical

## 2014-03-16 VITALS — BP 160/94 | HR 93 | Temp 97.9°F | Wt 306.2 lb

## 2014-03-16 DIAGNOSIS — R0981 Nasal congestion: Secondary | ICD-10-CM

## 2014-03-16 DIAGNOSIS — R042 Hemoptysis: Secondary | ICD-10-CM

## 2014-03-16 DIAGNOSIS — R918 Other nonspecific abnormal finding of lung field: Secondary | ICD-10-CM | POA: Insufficient documentation

## 2014-03-16 DIAGNOSIS — R5383 Other fatigue: Secondary | ICD-10-CM | POA: Insufficient documentation

## 2014-03-16 DIAGNOSIS — R0489 Hemorrhage from other sites in respiratory passages: Secondary | ICD-10-CM | POA: Insufficient documentation

## 2014-03-16 LAB — CBC WITH DIFFERENTIAL/PLATELET
Basophils Absolute: 0 10*3/uL (ref 0.0–0.1)
Basophils Relative: 0.3 % (ref 0.0–3.0)
Eosinophils Absolute: 0.3 10*3/uL (ref 0.0–0.7)
Eosinophils Relative: 6.6 % — ABNORMAL HIGH (ref 0.0–5.0)
HEMATOCRIT: 39.6 % (ref 39.0–52.0)
HEMOGLOBIN: 13.4 g/dL (ref 13.0–17.0)
LYMPHS PCT: 15.1 % (ref 12.0–46.0)
Lymphs Abs: 0.6 10*3/uL — ABNORMAL LOW (ref 0.7–4.0)
MCHC: 33.8 g/dL (ref 30.0–36.0)
MCV: 85.8 fl (ref 78.0–100.0)
Monocytes Absolute: 0.4 10*3/uL (ref 0.1–1.0)
Monocytes Relative: 8.5 % (ref 3.0–12.0)
Neutro Abs: 3 10*3/uL (ref 1.4–7.7)
Neutrophils Relative %: 69.5 % (ref 43.0–77.0)
Platelets: 172 10*3/uL (ref 150.0–400.0)
RBC: 4.62 Mil/uL (ref 4.22–5.81)
RDW: 14.2 % (ref 11.5–15.5)
WBC: 4.3 10*3/uL (ref 4.0–10.5)

## 2014-03-16 NOTE — Progress Notes (Signed)
Subjective:    Patient ID: Steven Ibarra, male    DOB: 03/07/1956, 58 y.o.   MRN: [id number]  HPI   Pt in for follow up. He is stressed waiting for biopsy results. Pt has nodules on both sides. Pt states biospsy was on on December. Pt had pulmonary hemmorhage after biopsy. He was in intensive care for 24 hours. Pt denies any abnormal respiratory symptoms since discharge. No productive cough and not cough. No back pain. No chest pain. No leg pain.  Pt is back to work. 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  Pt feels just mild fatigue.    Past Medical History  Diagnosis Date  . Hyperlipidemia   . Obesity   . Overweight(278.02) 10/23/2009  . Mixed hyperlipidemia 10/23/2009  . ELEVATED BLOOD PRESSURE 04/23/2010  . DYSPNEA ON EXERTION 10/23/2009  . ALLERGIC RHINITIS, SEASONAL 10/23/2009  . OSA (obstructive sleep apnea)   . SLEEP APNEA, OBSTRUCTIVE 10/23/2009    Sleep study Done at [facility]  . Hearing loss   . Hypertension     Does not see a cardiologist, has not had a stress, echo   . GERD (gastroesophageal reflux disease)   . Diabetes mellitus approx 2003    type 2  . DIABETES MELLITUS, TYPE II 10/23/2009  . S/P radiation therapy 07/06/11 - 08/19/11    Right Facial and Right Neck Nodes and right Trigeminal  Nerve to Base of Skull/ Total Dose 6600 cGy/ 33 Fractions  . Preventative health care 09/30/2011  . Hypothyroid 01/28/2012  . Otitis externa of right ear 07/06/2012  . Pulled muscle 07/06/12  . Cancer   . Skin cancer     on nose, 9-10 yrs ago  . Basal cell carcinoma of neck 05/28/2011    r suprahyoid, radical neck dissection S/p 6 weeks of targeted radiation therapy   . Type II or unspecified type diabetes mellitus with unspecified complication, uncontrolled     type 2     History   Social History  . Marital Status: Married    Spouse Name: N/A    Number of Children: N/A  . Years of Education: N/A   Occupational History  . Not on file.   Social History Main  Topics  . Smoking status: Former Smoker -- 1.00 packs/day for 9 years    Types: Cigarettes    Quit date: 03/31/1975  . Smokeless tobacco: Never Used  . Alcohol Use: No     Comment: 4 beers a month, 06/12/11 rarely uses now  . Drug Use: No  . Sexual Activity: Yes   Other Topics Concern  . Not on file   Social History Narrative   Patient is married.   Patient with a history of smoking one pack per day for approximately 29 years from ages of [age]. Patient denies ever having used smokeless tobacco. Patient with rare use of alcohol.      Mother died at age 43 from congestive heart failure complications. Father died at the age of 52 secondary to a stroke.    Past Surgical History  Procedure Laterality Date  . Tonsillectomy and adenoidectomy    . Skin cancer removal    . Radical neck dissection  05/28/2011    Procedure: RADICAL NECK DISSECTION;  Surgeon: Melissa Montane, MD;  Location: [facility];  Service: ENT;  Laterality: N/A;  Suprahyoid Neck Dissection  . Lung biopsy Right 03/08/2014    Family History  Problem Relation Age of Onset  .  Other Mother     CHF  . Stroke Father   . Hyperlipidemia Father   . Heart disease Father     s/p valve replacement  . Hyperlipidemia Brother   . Obesity Brother   . Other Brother     Back pain  . Hyperlipidemia Brother   . Hypertension Brother   . Other Brother     Panic attacks  . Hyperlipidemia Brother   . Anesthesia problems Neg Hx     No Known Allergies  Current Outpatient Prescriptions on File Prior to Visit  Medication Sig Dispense Refill  . ACCU-CHEK AVIVA PLUS test strip USE AS DIRECTED 100 each 2  . ACCU-CHEK FASTCLIX LANCETS MISC USE AS DIRECTED 102 each 1  . Ascorbic Acid (VITAMIN C) 1000 MG tablet Take 1,000 mg by mouth daily.    Marland Kitchen aspirin 81 MG tablet Take 81 mg by mouth daily.    . cetirizine (ZYRTEC) 10 MG tablet Take 10 mg by mouth daily as needed for allergies.     Marland Kitchen CRESTOR 40 MG tablet TAKE 1 TABLET (40 MG TOTAL) BY MOUTH  DAILY. 90 tablet 0  . fenofibrate 160 MG tablet TAKE 1 TABLET (160 MG TOTAL) BY MOUTH DAILY. 90 tablet 0  . ibuprofen (ADVIL,MOTRIN) 200 MG tablet Take 600 mg by mouth every 6 (six) hours as needed for pain.     Marland Kitchen insulin aspart (NOVOLOG) 100 UNIT/ML injection 15 units tid for high blood sugar. Test daily after dinner and see sliding scale for blood sugar  Please dispense the amount pt needs for 1 month 10 mL 4  . Insulin Glargine (LANTUS SOLOSTAR) 100 UNIT/ML Solostar Pen 100 units SQ q am and 60 units SQ in pm 60 mL 3  . Insulin Pen Needle 31G X 8 MM MISC Use bid prn  DX: E11.8 100 each 1  . Krill Oil 300 MG CAPS Take 1 capsule by mouth daily.    Marland Kitchen levothyroxine (SYNTHROID, LEVOTHROID) 75 MCG tablet TAKE 1 TABLET (75 MCG TOTAL) BY MOUTH DAILY. 30 tablet 2  . losartan (COZAAR) 50 MG tablet Take 1 tablet (50 mg total) by mouth daily. 30 tablet 5  . metFORMIN (GLUCOPHAGE) 500 MG tablet 2 tabs po bid and 1 tab po q noon 150 tablet 5  . Misc Natural Products (OSTEO BI-FLEX TRIPLE STRENGTH PO) Take 2 tablets by mouth daily.    . NON FORMULARY 1 each by Other route 4 (four) times daily. Accu-check multiclix lancets- use as directed    . ranitidine (ZANTAC) 300 MG tablet Take 1 tablet (300 mg total) by mouth at bedtime. 30 tablet 3  . Syringe, Disposable, 3 ML MISC DX 250.0  Use bid as directed 100 each 2   No current facility-administered medications on file prior to visit.    BP 160/94 mmHg  Pulse 93  Temp(Src) 97.9 F (36.6 C) (Oral)  Wt 306 lb 3.2 oz (138.891 kg)  SpO2 95%      Review of Systems  Constitutional: Positive for fatigue. Negative for fever, chills and diaphoresis.       Mild fatigue.  HENT: Positive for congestion. Negative for drooling, ear discharge, facial swelling, hearing loss, nosebleeds, postnasal drip, rhinorrhea, sinus pressure, sneezing, sore throat and tinnitus.        Very faint minimal nasal congestion for 1 day.  Respiratory: Negative for cough,  choking, chest tightness, shortness of breath and wheezing.   Cardiovascular: Negative for chest pain and palpitations.  Gastrointestinal: Negative.  Musculoskeletal: Negative for back pain.  Neurological: Negative for dizziness, tremors, seizures, syncope, facial asymmetry, speech difficulty, weakness, light-headedness, numbness and headaches.       Objective:   Physical Exam  General  Mental Status - Alert. General Appearance - Well groomed. Not in acute distress.  Skin Rashes- No Rashes.  HEENT Head- Normal. Ear Auditory Canal - Left- Normal. Right - Normal.Tympanic Membrane- Left- Normal. Right- Normal. Eye Sclera/Conjunctiva- Left- Normal. Right- Normal. Nose & Sinuses Nasal Mucosa- Left-  Not oggy or Congested. Right-  Not  boggy or Congested. Mouth & Throat Lips: Upper Lip- Normal: no dryness, cracking, pallor, cyanosis, or vesicular eruption. Lower Lip-Normal: no dryness, cracking, pallor, cyanosis or vesicular eruption. Buccal Mucosa- Bilateral- No Aphthous ulcers. Oropharynx- No Discharge or Erythema. Tonsils: Characteristics- Bilateral- No Erythema or Congestion. Size/Enlargement- Bilateral- No enlargement. Discharge- bilateral-None.  Neck Neck- Supple. No Masses. No tracheal deviation.   Chest and Lung Exam Auscultation: Breath Sounds:- even and unlabored. Lying supine o2 at 96%. Cardiovascular Auscultation:Rythm- Regular, rate and rhythm. Murmurs & Other Heart Sounds:Ausculatation of the heart reveal- No Murmurs.  Lymphatic Head & Neck General Head & Neck Lymphatics:   Lower ext- no pedal edema. Negative homans sign bilaterally. Bilateral: Description- No Localized lymphadenopathy.        Assessment & Plan:

## 2014-03-16 NOTE — Assessment & Plan Note (Signed)
For your nasal congestion which is very mild and hx of possible allergies use flonase spray otc. If your symptoms worsen let us know.

## 2014-03-16 NOTE — Telephone Encounter (Signed)
Repeat cxr order to do in 3 weeks.

## 2014-03-16 NOTE — Assessment & Plan Note (Signed)
Recent pulmonary hemorrhage. Will get cxr today to compare to prior. He looks stable today.

## 2014-03-16 NOTE — Assessment & Plan Note (Signed)
Mild fatigue about 2 weeks post hemorrhage. Will get cbc today.

## 2014-03-16 NOTE — Patient Instructions (Addendum)
Recent pulmonary hemorrhage. Will get cxr today to compare to prior. He looks stable today.  Mild fatigue about 2 weeks post hemorrhage. Will get cbc today.  For your nasal congestion which is very mild and hx of possible allergies use flonase spray otc. If your symptoms worsen let us know.  Follow up as regularly scheduled with Dr. Randel Pigg or as needed.

## 2014-03-16 NOTE — Progress Notes (Signed)
Pre visit review using our clinic review tool, if applicable. No additional management support is needed unless otherwise documented below in the visit note. 

## 2014-03-18 ENCOUNTER — Other Ambulatory Visit: Payer: Self-pay | Admitting: Family Medicine

## 2014-03-19 ENCOUNTER — Ambulatory Visit: Payer: BC Managed Care – PPO | Attending: Pulmonary Disease | Admitting: Sleep Medicine

## 2014-03-19 DIAGNOSIS — G4710 Hypersomnia, unspecified: Secondary | ICD-10-CM | POA: Insufficient documentation

## 2014-03-19 DIAGNOSIS — Z794 Long term (current) use of insulin: Secondary | ICD-10-CM | POA: Diagnosis not present

## 2014-03-19 DIAGNOSIS — R0683 Snoring: Secondary | ICD-10-CM | POA: Diagnosis not present

## 2014-03-19 DIAGNOSIS — Z6835 Body mass index (BMI) 35.0-35.9, adult: Secondary | ICD-10-CM | POA: Insufficient documentation

## 2014-03-19 DIAGNOSIS — G4733 Obstructive sleep apnea (adult) (pediatric): Secondary | ICD-10-CM | POA: Insufficient documentation

## 2014-03-19 DIAGNOSIS — Z79899 Other long term (current) drug therapy: Secondary | ICD-10-CM | POA: Insufficient documentation

## 2014-03-27 ENCOUNTER — Other Ambulatory Visit: Payer: Self-pay

## 2014-03-27 MED ORDER — RANITIDINE HCL 300 MG PO TABS: 300.0000 mg | ORAL_TABLET | Freq: Every day | ORAL | Status: DC

## 2014-04-02 ENCOUNTER — Encounter: Payer: Self-pay | Admitting: Radiation Oncology

## 2014-04-02 NOTE — Progress Notes (Signed)
Thoracic Location of Tumor / Histology: right lung, upper lobe  Patient presented 1  months ago with symptoms of: 02/12/14 routine FU PET scan revealed new lung nodules, bilateral   Biopsies of  RUL lung revealed:  03/08/14 Diagnosis Lung, needle/core biopsy(ies), right upper lobe - INVASIVE SQUAMOUS CELL CARCINOMA, SEE COMMENT.  Tobacco/Marijuana/Snuff/ETOH use: smoked cigarettes 1 PPD x 9 years, quit 1977,  No smokeless tobacco, history of alcohol, rare since 2013, no drug use  Past/Anticipated interventions by cardiothoracic surgery, if any: biopsy of RUL lung nodule, pulmonary hemorrhage after bx  Past/Anticipated interventions by medical oncology, if any: no appointment scheduled at this time  Signs/Symptoms  Weight changes, if any: no  Respiratory complaints, if any: no  Hemoptysis, if any: not since post bx bleed Pain issues, if any:  No  SAFETY ISSUES:  Prior radiation? YES 07/06/11- 08/19/11 right facial nodes thru R neck nodes, R trigeminal nerve to base of skull/ total dose 6600 vGy 33 fx  Pacemaker/ICD?  no  Possible current pregnancy? na  Is the patient on methotrexate? 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

## 2014-04-04 ENCOUNTER — Encounter: Payer: Self-pay | Admitting: Radiation Oncology

## 2014-04-04 ENCOUNTER — Ambulatory Visit
Admission: RE | Admit: 2014-04-04 | Discharge: 2014-04-04 | Disposition: A | Discharge status: Home / Self Care | Payer: BLUE CROSS/BLUE SHIELD | Source: Ambulatory Visit | Attending: Radiation Oncology | Admitting: Radiation Oncology

## 2014-04-04 VITALS — BP 135/89 | HR 92 | Temp 98.0°F | Resp 20 | Wt 304.0 lb

## 2014-04-04 DIAGNOSIS — I10 Essential (primary) hypertension: Secondary | ICD-10-CM | POA: Diagnosis not present

## 2014-04-04 DIAGNOSIS — C7801 Secondary malignant neoplasm of right lung: Secondary | ICD-10-CM | POA: Diagnosis not present

## 2014-04-04 DIAGNOSIS — Z794 Long term (current) use of insulin: Secondary | ICD-10-CM | POA: Diagnosis not present

## 2014-04-04 DIAGNOSIS — E039 Hypothyroidism, unspecified: Secondary | ICD-10-CM | POA: Diagnosis not present

## 2014-04-04 DIAGNOSIS — C7800 Secondary malignant neoplasm of unspecified lung: Secondary | ICD-10-CM | POA: Insufficient documentation

## 2014-04-04 DIAGNOSIS — Z85828 Personal history of other malignant neoplasm of skin: Secondary | ICD-10-CM | POA: Insufficient documentation

## 2014-04-04 DIAGNOSIS — Z87891 Personal history of nicotine dependence: Secondary | ICD-10-CM | POA: Insufficient documentation

## 2014-04-04 DIAGNOSIS — Z510 Encounter for antineoplastic radiation therapy: Secondary | ICD-10-CM | POA: Insufficient documentation

## 2014-04-04 DIAGNOSIS — K219 Gastro-esophageal reflux disease without esophagitis: Secondary | ICD-10-CM | POA: Insufficient documentation

## 2014-04-04 DIAGNOSIS — E119 Type 2 diabetes mellitus without complications: Secondary | ICD-10-CM | POA: Diagnosis not present

## 2014-04-04 DIAGNOSIS — Z923 Personal history of irradiation: Secondary | ICD-10-CM | POA: Diagnosis not present

## 2014-04-04 DIAGNOSIS — Z7982 Long term (current) use of aspirin: Secondary | ICD-10-CM | POA: Diagnosis not present

## 2014-04-04 DIAGNOSIS — C7802 Secondary malignant neoplasm of left lung: Secondary | ICD-10-CM | POA: Diagnosis not present

## 2014-04-04 DIAGNOSIS — E785 Hyperlipidemia, unspecified: Secondary | ICD-10-CM | POA: Insufficient documentation

## 2014-04-04 DIAGNOSIS — C770 Secondary and unspecified malignant neoplasm of lymph nodes of head, face and neck: Secondary | ICD-10-CM | POA: Diagnosis not present

## 2014-04-04 DIAGNOSIS — J95830 Postprocedural hemorrhage and hematoma of a respiratory system organ or structure following a respiratory system procedure: Secondary | ICD-10-CM | POA: Diagnosis not present

## 2014-04-04 HISTORY — DX: Malignant neoplasm of unspecified part of unspecified bronchus or lung: C34.90

## 2014-04-04 NOTE — Progress Notes (Signed)
Radiation Oncology         [phone] ________________________________  Outpatient Re-Consultation  Name: Steven Ibarra MRN: [id number]  Date: 04/04/2014  DOB: 06-Feb-1956  TI:RWERX, Steven Levine, MD  Steven Montane, MD   REFERRING PHYSICIAN: Melissa Montane, MD     ICD-9-CM ICD-10-CM   1. Malignant neoplasm metastatic to both lungs 197.0 C78.01     C78.02     DIAGNOSIS: Basal Cell Carcinoma of the skin with squamous differentiation, Metastatic to the Submandibular Lymph Node, Right Neck -- now with bilateral lung metastases (Stage IV)  Interval Since Last Radiation: Completed total dose of 66Gy in 33 fractions to the right facial nodes through the right neck and right trigeminal nerve to base of skull on 08-19-11  Steven Ibarra is a 59 y.o. male well known by me who unfortunately re-presents with lung metastases.  Dr Steven Ibarra ordered surveillance imaging (CT of neck w/ Cont) on 01/30/14 and this showed some enhancement in the right neck, possible for subcentimeter recurrence.  This was followed by a PET on 02/12/14 and that unfortunately showed hypermetabolic bilateral lung masses. Faint focal activity in the right submandibular resection site is likely postoperative per radiology report but active disease is also a distinct possibility given tumor board review of his serial imaging. The right upper lung mass was biopsied on 03/08/14 showing invasive squamous cell carcinoma. I discussed this with pathology - his original neck disease was a basal cell carcinoma with squamous differentiation; this appears consistent with a metastasis from his neck disease. He has been reviewed at ENT tumor board.  He is asymptomatic. He had some post operative hemorrhage in his RUL and is getting CXRs to follow this. It is resolving.  He smoked cigarettes 1 PPD x 9 years, quit 1977.  PREVIOUS RADIATION THERAPY: Yes as above  PAST MEDICAL HISTORY:  has a past medical history of  Hyperlipidemia; Obesity; Overweight(278.02) (10/23/2009); Mixed hyperlipidemia (10/23/2009); ELEVATED BLOOD PRESSURE (04/23/2010); DYSPNEA ON EXERTION (10/23/2009); ALLERGIC RHINITIS, SEASONAL (10/23/2009); OSA (obstructive sleep apnea); SLEEP APNEA, OBSTRUCTIVE (10/23/2009); Hearing loss; Hypertension; GERD (gastroesophageal reflux disease); Diabetes mellitus (approx 2003); DIABETES MELLITUS, TYPE II (10/23/2009); S/P radiation therapy (07/06/11 - 08/19/11); Preventative health care (09/30/2011); Hypothyroid (01/28/2012); Otitis externa of right ear (07/06/2012); Pulled muscle (07/06/12); Cancer; Skin cancer; Basal cell carcinoma of neck (05/28/2011); Type II or unspecified type diabetes mellitus with unspecified complication, uncontrolled; and Lung cancer (03/08/14).    PAST SURGICAL HISTORY: Past Surgical History  Procedure Laterality Date  . Tonsillectomy and adenoidectomy    . Skin cancer removal    . Radical neck dissection  05/28/2011    Procedure: RADICAL NECK DISSECTION;  Surgeon: Steven Montane, MD;  Location: [place];  Service: ENT;  Laterality: N/A;  Suprahyoid Neck Dissection  . Lung biopsy Right 03/08/2014    FAMILY HISTORY: family history includes Heart disease in his father; Hyperlipidemia in his brother, brother, brother, and father; Hypertension in his brother; Obesity in his brother; Other in his brother, brother, and mother; Stroke in his father. There is no history of Anesthesia problems.  SOCIAL HISTORY:  reports that he quit smoking about 39 years ago. His smoking use included Cigarettes. He has a 9 pack-year smoking history. He has never used smokeless tobacco. He reports that he does not drink alcohol or use illicit drugs.  ALLERGIES: Review of patient's allergies indicates no known allergies.  MEDICATIONS:  Current Outpatient Prescriptions  Medication Sig Dispense Refill  . ACCU-CHEK AVIVA PLUS test strip USE  AS DIRECTED 100 each 2  . ACCU-CHEK FASTCLIX LANCETS MISC USE AS DIRECTED 102 each 1   . Ascorbic Acid (VITAMIN C) 1000 MG tablet Take 1,000 mg by mouth daily.    Marland Kitchen aspirin 81 MG tablet Take 81 mg by mouth daily.    . B-D UF III MINI Ibarra NEEDLES 31G X 5 MM MISC USE TWICE DAILY AS DIRECTED WITH LANTUS DX: 250.0 100 each 2  . cetirizine (ZYRTEC) 10 MG tablet Take 10 mg by mouth daily as needed for allergies.     Marland Kitchen CRESTOR 20 MG tablet TAKE 1 TABLET EVERY DAY 30 tablet 2  . fenofibrate 160 MG tablet   2  . ibuprofen (ADVIL,MOTRIN) 200 MG tablet Take 600 mg by mouth every 6 (six) hours as needed for pain.     Marland Kitchen insulin aspart (NOVOLOG) 100 UNIT/ML injection 15 units tid for high blood sugar. Test daily after dinner and see sliding scale for blood sugar  Please dispense the amount pt needs for 1 month 10 mL 4  . Krill Oil 300 MG CAPS Take 1 capsule by mouth daily.    Marland Kitchen LANTUS SOLOSTAR 100 UNIT/ML Solostar Ibarra INJECT 100 UNITS IN THE MORNING AND 50 UNITS IN THE EVENING 1 Ibarra 3  . levothyroxine (SYNTHROID, LEVOTHROID) 75 MCG tablet TAKE 1 TABLET (75 MCG TOTAL) BY MOUTH DAILY. 30 tablet 2  . losartan (COZAAR) 50 MG tablet Take 1 tablet (50 mg total) by mouth daily. 30 tablet 5  . metFORMIN (GLUCOPHAGE) 500 MG tablet 2 tabs po bid and 1 tab po q noon 150 tablet 5  . Misc Natural Products (OSTEO BI-FLEX TRIPLE STRENGTH PO) Take 2 tablets by mouth daily.    . NON FORMULARY 1 each by Other route 4 (four) times daily. Accu-check multiclix lancets- use as directed    . ranitidine (ZANTAC) 300 MG tablet Take 1 tablet (300 mg total) by mouth at bedtime. 30 tablet 3  . Syringe, Disposable, 3 ML MISC DX 250.0  Use bid as directed 100 each 2   No current facility-administered medications for this encounter.    REVIEW OF SYSTEMS:  Notable for that above.   PHYSICAL EXAM:  weight is 304 lb (137.893 kg). His oral temperature is 98 F (36.7 C). His blood pressure is 135/89 and his pulse is 92. His respiration is 20 and oxygen saturation is 97%.   General: Alert and oriented, in no acute  distress HEENT: Head is normocephalic. Extraocular movements are intact. Oropharynx is clear. Neck: Neck is supple, no palpable cervical or supraclavicular lymphadenopathy. Heart: Regular in rate and rhythm with no murmurs, rubs, or gallops. Chest: Clear to auscultation bilaterally, with no rhonchi, wheezes, or rales. Abdomen: Soft, nontender, nondistended, with no rigidity or guarding. Extremities: No cyanosis or edema. Lymphatics: see Neck Exam Skin: No concerning lesions. Musculoskeletal: ambulatory Neurologic: Cranial nerves II through XII are grossly intact. No obvious focalities. Speech is fluent. Coordination is intact. Psychiatric: Judgment and insight are intact. Affect is appropriate.   ECOG = 0  0 - Asymptomatic (Fully active, able to carry on all predisease activities without restriction)  1 - Symptomatic but completely ambulatory (Restricted in physically strenuous activity but ambulatory and able to carry out work of a light or sedentary nature. For example, light housework, office work)  2 - Symptomatic, <50% in bed during the day (Ambulatory and capable of all self care but unable to carry out any work activities. Up and about more than 50% of  waking hours)  3 - Symptomatic, >50% in bed, but not bedbound (Capable of only limited self-care, confined to bed or chair 50% or more of waking hours)  4 - Bedbound (Completely disabled. Cannot carry on any self-care. Totally confined to bed or chair)  5 - Death   Steven Ibarra MM, Creech RH, Tormey DC, et al. [phone]). "Toxicity and response criteria of the [facility]". [place] Oncol. 5 (6): 649-55   LABORATORY DATA:  Lab Results  Component Value Date   WBC 4.3 03/16/2014   HGB 13.4 03/16/2014   HCT 39.6 03/16/2014   MCV 85.8 03/16/2014   PLT 172.0 03/16/2014   CMP     Component Value Date/Time   NA 138 03/09/2014 0243   K 3.9 03/09/2014 0243   CL 101 03/09/2014 0243   CO2 25 03/09/2014 0243    GLUCOSE 184* 03/09/2014 0243   BUN 22 03/09/2014 0243   BUN 13.8 09/12/2012 0823   CREATININE 0.87 03/09/2014 0243   CREATININE 1.11 09/08/2013 0936   CREATININE 1.0 09/12/2012 0823   CALCIUM 8.6 03/09/2014 0243   PROT 6.2 03/08/2014 1230   ALBUMIN 3.5 03/08/2014 1230   AST 13 03/08/2014 1230   ALT 18 03/08/2014 1230   ALKPHOS 45 03/08/2014 1230   BILITOT 0.4 03/08/2014 1230   GFRNONAA >90 03/09/2014 0243   GFRAA >90 03/09/2014 0243     Lab Results  Component Value Date   TSH 2.24 12/19/2013       RADIOGRAPHY: Dg Chest 2 View  03/16/2014   CLINICAL DATA:  Recent pulmonary hemorrhage.  Status post biopsy  EXAM: CHEST  2 VIEW  COMPARISON:  March 09, 2014  FINDINGS: The area of opacity in the right mid lung noted previously has become smaller. There remains a somewhat nodular opacity in the anterior segment of the right upper lobe measuring 2.3 by 2.0 by 1.9 cm. Lungs elsewhere clear. Heart size and pulmonary vascular normal. No adenopathy. No pneumothorax. There is degenerative change in the thoracic spine.  IMPRESSION: Opacity in the anterior right midlung has become smaller. There remains a somewhat nodular appearing opacity in this area. Follow-up studies to clearing advised. No new opacity.   Electronically Signed   By: Lowella Grip M.D.   On: 03/16/2014 14:00   Ct Biopsy  03/08/2014   CLINICAL DATA:  59 year old male with a history of multiple lung nodules. He has been referred for percutaneous biopsy.  EXAM: CT GUIDED CORE BIOPSY OF RIGHT UPPER LOBE LUNG NODULE  ANESTHESIA/SEDATION: 2.0  Mg IV Versed; 50 mcg IV Fentanyl  Total Moderate Sedation Time: 15 minutes.  PROCEDURE: The procedure risks, benefits, and alternatives were explained to the patient. Questions regarding the procedure were encouraged and answered. The patient understands and consents to the procedure.  Patient was placed in the supine position and CT scan of the chest was performed for planning purposes.  Right upper lobe nodule was identified is the safest lesion for biopsy.  The right anterior chest was prepped with Betadinein a sterile fashion, and a sterile drape was applied covering the operative field. A sterile gown and sterile gloves were used for the procedure. Local anesthesia was provided with 1% Lidocaine.  Once the patient is prepped and draped sterilely and the skin was infiltrated with 1% lidocaine for local anesthesia. A 17 gauge trocar needle was advanced in intercostal location to the right upper lobe nodule. The tip of the needle was identified on the proximal margin of  the lesion with CT imaging.  Two separate 18 core biopsy were retrieved.  At this point, the patient developed hemoptysis. He was rapidly placed into a right decubitus position, and nasal cannulated was exchanged for a face mask with 15 L of oxygen.  A respiratory code was called, with assistance from critical care, anesthesia, and a rapid response team.  Sequential CT scans after the biopsy were performed in the supine position and also in right decubitus.  After a hemodynamics stable is a shin, the patient remained tachycardic at 104 beats per min. The patient was oxygen eating with a face massive 15 L approximately 95%. Blood pressure remained at 867 systolic. The patient was alert and appropriately answering questions.  Few transported from the CT scan of to the ICU for observation.  No significant blood loss was encountered.  Complications:  COMPLICATIONS: Hemoptysis with right pulmonary hemorrhage. Society of Interventional Radiology guidelines classified this as a class C complication, and given his admission to the ICU for observation.  FINDINGS: Initial CT in supine position demonstrates right upper lobe nodule measuring 17 mm, similar to the comparison CT and PET-CT.  After 2 separate 18 gauge biopsy, right upper lobe pulmonary hemorrhage is identified. No pneumothorax.  A sequential CT scan in the right decubitus position  approximately 4 min after the first CT demonstrates similar volume of right upper lobe hemorrhage. The right decubitus study demonstrates trace pneumothorax in the in the dependent portion of the right thorax.  IMPRESSION: Status post right upper lobe nodule percutaneous biopsy, with tissue specimen sent to pathology for complete histopathologic analysis.  Right-sided pulmonary hemorrhage with tiny right-sided pneumothorax.  Signed,  Dulcy Fanny. Earleen Newport, DO  [facility]  [facility]  PLAN: The patient was stabilized in the CT scanner be for transportation to the ICU for observation.  The ICU will be managing the patient's respiratory status, and at the time of transfer there was no need for intubation.  The need for follow-up chest x-ray was discussed given the risk of a growing pneumothorax.   Electronically Signed   By: Corrie Mckusick D.O.   On: 03/08/2014 12:40   Dg Chest Port 1 View  03/09/2014   CLINICAL DATA:  Pulmonary hemorrhage.  EXAM: PORTABLE CHEST - 1 VIEW  COMPARISON:  Pulmonary hemorrhage following biopsy  FINDINGS: The heart size is normal. Lung volumes remain low. There is slight decrease and the pulmonary hemorrhage in the right upper lobe. Left basilar airspace disease has increased, likely atelectasis.  IMPRESSION: 1. Improving opacity in the right upper lobe compatible with known pulmonary hemorrhage. 2. Low lung volumes with increased left basilar airspace disease, likely atelectasis. Early infection or aspiration are also considered.   Electronically Signed   By: Lawrence Santiago M.D.   On: 03/09/2014 07:50   Dg Chest Port 1 View  03/08/2014   CLINICAL DATA:  Needle biopsy today.  RIGHT middle Lung biopsy.  EXAM: PORTABLE CHEST - 1 VIEW  COMPARISON:  Chest CT from biopsy today.  FINDINGS: Inferior RIGHT upper lobe opacification is present compatible with hemorrhage associated with needle biopsy. No pneumothorax. Lingular nodule not visible.  Cardiopericardial silhouette within normal limits.  IMPRESSION: No pneumothorax status post RIGHT upper lobe nodule biopsy. Airspace disease in the inferior RIGHT upper lobe compatible with hemorrhage.   Electronically Signed   By: Dereck Ligas M.D.   On: 03/08/2014 15:17      PET 02/12/14 IMPRESSION: 1. Single bilateral upper lobe hypermetabolic pulmonary  nodules, measuring 1.7 cm on the right and up to 2.9 cm in the lingula, suspicious for pulmonary metastatic disease. There is some faint nodularity adjacent to the lingular nodule which could represent satellite lesions or inflammatory lesions. 2. No other compelling findings of metastatic disease. Faint focal activity in the right submandibular resection site is likely postoperative. A left adrenal nodule is borderline hypermetabolic but is not changed in size compared to 2013 and accordingly is probably benign. 3. Ancillary findings include chronic bilateral maxillary sinusitis, coronary artery atherosclerosis, and mitral valve calcification.   IMPRESSION/PLAN:  I talked with Mr Kann about the findings and work-up thus far. We discussed the patient's diagnosis of oligometastatic cancer to his lungs and general treatment for this, highlighting the role of radiotherapy in the management. We discussed the available radiation techniques, and focused on the details of logistics and delivery.    We discussed the risks, benefits, and side effects of stereotactic body radiation therapy to his lung masses for aggressive local control, hoping to render him without progressive disease for a durable period of time.  He understands that there is still a high likelihood that disease will recur elsewhere in the future and he will need to be followed closely. Side effects may include but not necessarily be limited to: fatigue (common) and rare late effects such as symptomatic injury to lung, heart, and/or bowel.  No guarantees of treatment were  given. A consent form was signed and placed in the patient's medical record.  The patient was encouraged to ask questions that I answered to the best of my ability.  Anticipate simulation on 04/09/14.  I spoke with medical oncology at [facility] and there is no systemic option to offer here. I then called Dr. Lestine Mount of medical oncology at [facility].  He agrees with SBRT initially, and is happy to see the patient in consultation for consideration of systemic options in the future. I will make that referral. The patient is in excellent shape, otherwise, and would like to know all of his options for aggressive management of his disease.   I spent 45 minutes  face to face with the patient and more than 50% of that time was spent in counseling and/or coordination of care.   __________________________________________   [name], MD

## 2014-04-04 NOTE — Addendum Note (Signed)
Encounter addended by: [name], MD on: 04/04/2014  5:13 PM<BR>     Documentation filed: [employer], Flowsheet VN, Orders

## 2014-04-05 ENCOUNTER — Other Ambulatory Visit: Payer: Self-pay | Admitting: Family Medicine

## 2014-04-09 ENCOUNTER — Ambulatory Visit
Admission: RE | Admit: 2014-04-09 | Discharge: 2014-04-09 | Disposition: A | Discharge status: Home / Self Care | Payer: BLUE CROSS/BLUE SHIELD | Source: Ambulatory Visit | Attending: Radiation Oncology | Admitting: Radiation Oncology

## 2014-04-09 ENCOUNTER — Telehealth: Payer: Self-pay | Admitting: Pulmonary Disease

## 2014-04-09 DIAGNOSIS — C7801 Secondary malignant neoplasm of right lung: Secondary | ICD-10-CM

## 2014-04-09 DIAGNOSIS — C7802 Secondary malignant neoplasm of left lung: Principal | ICD-10-CM

## 2014-04-09 DIAGNOSIS — Z510 Encounter for antineoplastic radiation therapy: Secondary | ICD-10-CM | POA: Diagnosis not present

## 2014-04-09 NOTE — Progress Notes (Signed)
  Radiation Oncology         [phone] ________________________________  Name: Steven Ibarra MRN: [id number]  Date: 04/09/2014  DOB: [birth year]  COMPLEX CT SIMULATION / TREATMENT PLANNING NOTE   ICD-9-CM ICD-10-CM   1. Malignant neoplasm metastatic to both lungs 197.0 C78.01     C78.02     The patient was positioned on the CT simulator in a complex treatment device custom fitted to their body: A body fix blue bag. The patient's head was in an Accuform.  The patient's arms were over their head. An abdominal compression device was snugly fitted to decrease the patient's intrathoracic movements. High-resolution CT axial imaging with 4D technology and with thin slices was obtained of the chest. I verified that the images were good for treatment planning. Physics was on hand for the simulation due to the highly technical nature of it.  RESPIRATORY MOTION MANAGEMENT SIMULATION  NARRATIVE:  In order to account for effect of respiratory motion on target structures and other organs in the planning and delivery of radiotherapy, this patient underwent respiratory motion management simulation.  To accomplish this, when the patient was brought to the CT simulation planning suite, 4D respiratoy motion management CT images were obtained.  The CT images were loaded into the planning software.  Then, using a variety of tools including Cine, MIP, and standard views, the target volume and planning target volumes (PTV) were delineated.  Avoidance structures were contoured.  Treatment planning then occurred.  Dose volume histograms were generated and reviewed for each of the requested structure.  The resulting plan was carefully reviewed and approved today.   I contoured the patient's ITVs and increased ITVs with tight margins for the PTVs. I will prescribe SBRT, 54 Gy in 3 fractions every other day to the right upper lung mass, and 50Gy in 5 fractions every other day to the left lower lobe mass and adjacent  nodules.  I spoke with radiology - these nodules could be inflammatory but are equally suspicious for satellite metastatic nodules. I requested a DVH of the patient's lungs, target volumes, esophagus, heart, chest wall, bowel, spinal cord and airways for 3D conformal planning.  Cone beam CT scans will be performed prior to each fraction to allow close PTV margins and sparing of normal tissues from high doses.   -----------------------------------  [name], MD

## 2014-04-09 NOTE — Telephone Encounter (Signed)
PSG 03/19/14 >> AHI 36.2, SaO2 low 75%   Will have my nurse inform pt that sleep study shows severe sleep apnea.  This will qualify him for continued use of CPAP therapy.  Please forward a copy of this sleep study to his DME Huey Romans I believe).  He has appointment with me on 04/24/14.  Will discuss tx for OSA in more detail then.

## 2014-04-09 NOTE — Sleep Study (Signed)
[place]  NAME: Steven Ibarra DATE OF BIRTH:  Mar 20, 1956 MEDICAL RECORD NUMBER [id number]  LOCATION: [facility]  PHYSICIAN: Chesley Mires, M.D. DATE OF STUDY: 03/19/2014  SLEEP STUDY TYPE: Polysomnogram               REFERRING PHYSICIAN: Chesley Mires, MD  INDICATION FOR STUDY:  Steven Ibarra is a 59 y.o. male who presents to the sleep lab for evaluation of hypersomnia with obstructive sleep apnea.  He reports snoring, sleep disruption, apnea, and daytime sleepiness.  EPWORTH SLEEPINESS SCORE: 4. HEIGHT: 6\' 6"  (198.1 cm)  WEIGHT: (!) 306 lb (138.801 kg)    Body mass index is 35.37 kg/(m^2).  NECK SIZE:   in.  MEDICATIONS:  Current Outpatient Prescriptions on File Prior to Visit  Medication Sig Dispense Refill  . ACCU-CHEK AVIVA PLUS test strip USE AS DIRECTED 100 each 2  . ACCU-CHEK FASTCLIX LANCETS MISC USE AS DIRECTED 102 each 1  . Ascorbic Acid (VITAMIN C) 1000 MG tablet Take 1,000 mg by mouth daily.    Marland Kitchen aspirin 81 MG tablet Take 81 mg by mouth daily.    . B-D UF III MINI PEN NEEDLES 31G X 5 MM MISC USE TWICE DAILY AS DIRECTED WITH LANTUS DX: 250.0 100 each 2  . cetirizine (ZYRTEC) 10 MG tablet Take 10 mg by mouth daily as needed for allergies.     Marland Kitchen CRESTOR 20 MG tablet TAKE 1 TABLET EVERY DAY 30 tablet 2  . ibuprofen (ADVIL,MOTRIN) 200 MG tablet Take 600 mg by mouth every 6 (six) hours as needed for pain.     Marland Kitchen insulin aspart (NOVOLOG) 100 UNIT/ML injection 15 units tid for high blood sugar. Test daily after dinner and see sliding scale for blood sugar  Please dispense the amount pt needs for 1 month 10 mL 4  . Krill Oil 300 MG CAPS Take 1 capsule by mouth daily.    Marland Kitchen losartan (COZAAR) 50 MG tablet Take 1 tablet (50 mg total) by mouth daily. 30 tablet 5  . metFORMIN (GLUCOPHAGE) 500 MG tablet 2 tabs po bid and 1 tab po q noon 150 tablet 5  . Misc Natural Products (OSTEO BI-FLEX TRIPLE STRENGTH PO) Take 2 tablets by mouth  daily.    . NON FORMULARY 1 each by Other route 4 (four) times daily. Accu-check multiclix lancets- use as directed    . Syringe, Disposable, 3 ML MISC DX 250.0  Use bid as directed 100 each 2   No current facility-administered medications on file prior to visit.    SLEEP ARCHITECTURE:  Total recording time: 408 minutes.  Total sleep time was: 303 minutes.  Sleep efficiency: 74.4%.  Sleep latency: 19.5 minutes.  REM latency: 273.5 minutes.  Stage N1: 6.8%.  Stage N2: 81.7%.  Stage N3: 5.6%.  Stage R:  5.9%.  Supine sleep: 33 minutes.  Non-supine sleep: 270.5 minutes.  CARDIAC DATA:  Average heart rate: 85 beats per minute. Rhythm strip: sinus rhythm.  RESPIRATORY DATA: Average respiratory rate: 20. Snoring: loud. Average AHI: 36.2.   Apnea index: 11.5.  Hypopnea index: 24.7. Obstructive apnea index: 9.7.  Central apnea index: 0.  Mixed apnea index: 1.8. REM AHI: 40.  NREM AHI: 35.9. Supine AHI: 80. Non-supine AHI: 21.3.  MOVEMENT/PARASOMNIA:  Periodic limb movement: 0.  Period limb movements with arousals: 0. Restroom trips: 1.  OXYGEN DATA:  Baseline oxygenation: 98%. Lowest SaO2: 75%. Time spent below SaO2 90%: 56.4 minutes. Supplemental  oxygen used: none.  IMPRESSION/ RECOMMENDATION:   This study showed severe obstructive sleep apnea with an AHI of 36.2, and SaO2 low of 75%.   Additional therapies include weight loss, CPAP, oral appliance, or surgical evaluation.  Chesley Mires, M.D. Diplomate, Tax adviser of Sleep Medicine  ELECTRONICALLY SIGNED ON:  04/09/2014, 4:42 PM [place] PH: [phone]   FX: [phone] [place]

## 2014-04-10 NOTE — Telephone Encounter (Signed)
Patient notified of sleep study results. (copy of sleep study results faxed to [facility]) Patient says that he needs a later appointment on 04/24/14.  His appointment is at 10:15am and he has radiation treatment at 11:00am that same day.  He wants to know if there is any way you could add him in somewhere that afternoon after his radiation treatment?

## 2014-04-11 ENCOUNTER — Telehealth: Payer: Self-pay | Admitting: *Deleted

## 2014-04-11 NOTE — Telephone Encounter (Signed)
He can be schedule with me later, or he can have ROV with Tammy Parrett.

## 2014-04-11 NOTE — Telephone Encounter (Signed)
Called patient to inform of appt. With Dr. Evelina Bucy on 04-18-14 - arrival time - 8:15 am , spoke with patient and he is aware of this appt.

## 2014-04-11 NOTE — Telephone Encounter (Signed)
xxxxx 

## 2014-04-11 NOTE — Telephone Encounter (Signed)
Called patient to inform of consultation with Dr. Baltazar Najjar on 04-13-14 - arrival time - 1:45 pm - address [facility]. Steven Ibarra (located in the [employer] on the 3rd Floor), ph. Number - [phone], lvm for a return call

## 2014-04-11 NOTE — Telephone Encounter (Signed)
Called to reschedule pt and he states that he would like to cancel his appointment at this time. He wants to finish radiation completely, then he will call to reschedule.

## 2014-04-13 ENCOUNTER — Ambulatory Visit: Payer: BC Managed Care – PPO | Admitting: Radiation Oncology

## 2014-04-15 DIAGNOSIS — G4733 Obstructive sleep apnea (adult) (pediatric): Secondary | ICD-10-CM

## 2014-04-18 ENCOUNTER — Ambulatory Visit
Admission: RE | Admit: 2014-04-18 | Payer: BLUE CROSS/BLUE SHIELD | Source: Ambulatory Visit | Admitting: Radiation Oncology

## 2014-04-19 ENCOUNTER — Ambulatory Visit: Payer: BLUE CROSS/BLUE SHIELD | Admitting: Radiation Oncology

## 2014-04-20 ENCOUNTER — Ambulatory Visit: Payer: BLUE CROSS/BLUE SHIELD | Admitting: Radiation Oncology

## 2014-04-23 ENCOUNTER — Ambulatory Visit
Admission: RE | Admit: 2014-04-23 | Payer: BLUE CROSS/BLUE SHIELD | Source: Ambulatory Visit | Admitting: Radiation Oncology

## 2014-04-23 ENCOUNTER — Ambulatory Visit: Payer: BLUE CROSS/BLUE SHIELD | Admitting: Radiation Oncology

## 2014-04-24 ENCOUNTER — Ambulatory Visit: Payer: BLUE CROSS/BLUE SHIELD | Admitting: Radiation Oncology

## 2014-04-24 ENCOUNTER — Institutional Professional Consult (permissible substitution): Payer: BC Managed Care – PPO | Admitting: Pulmonary Disease

## 2014-04-25 ENCOUNTER — Ambulatory Visit
Admission: RE | Admit: 2014-04-25 | Payer: BLUE CROSS/BLUE SHIELD | Source: Ambulatory Visit | Admitting: Radiation Oncology

## 2014-04-26 ENCOUNTER — Ambulatory Visit: Payer: BLUE CROSS/BLUE SHIELD | Admitting: Radiation Oncology

## 2014-04-27 ENCOUNTER — Ambulatory Visit: Payer: BLUE CROSS/BLUE SHIELD | Admitting: Radiation Oncology

## 2014-04-30 ENCOUNTER — Telehealth: Payer: Self-pay | Admitting: Family Medicine

## 2014-04-30 ENCOUNTER — Ambulatory Visit
Admission: RE | Admit: 2014-04-30 | Discharge: 2014-04-30 | Disposition: A | Discharge status: Home / Self Care | Payer: BLUE CROSS/BLUE SHIELD | Source: Ambulatory Visit | Attending: Radiation Oncology | Admitting: Radiation Oncology

## 2014-04-30 ENCOUNTER — Ambulatory Visit: Payer: BLUE CROSS/BLUE SHIELD | Admitting: Radiation Oncology

## 2014-04-30 NOTE — Telephone Encounter (Signed)
Please advise.//AB/CMA 

## 2014-04-30 NOTE — Telephone Encounter (Signed)
Sure have him start a probiotic, mucinex bid and he can have Cefdinir 300 mg po bid x 10 days, come in if no better.

## 2014-04-30 NOTE — Telephone Encounter (Signed)
Caller name: obrian Relation to pt: self Call back number: [phone] [facility] [place]  Reason for call:   Patient states that he has chest congestion and would like to know if Dr. Charlett Blake would prescribe him something. He says that he wants this cleared up before he starts radiation.

## 2014-05-01 ENCOUNTER — Telehealth: Payer: Self-pay | Admitting: Family Medicine

## 2014-05-01 ENCOUNTER — Ambulatory Visit: Payer: BLUE CROSS/BLUE SHIELD | Admitting: Radiation Oncology

## 2014-05-01 MED ORDER — CEFDINIR 300 MG PO CAPS
300.0000 mg | ORAL_CAPSULE | Freq: Two times a day (BID) | ORAL | Status: DC
Start: 2014-05-01 — End: 2014-07-25

## 2014-05-01 NOTE — Telephone Encounter (Signed)
Caller name:Deatib Camar Relation to WU:GQBV Call back number:[phone] [facility]  Reason for call: pt states that he needs a PA for rx CRESTOR 20 MG tablet , pt has [employer].

## 2014-05-01 NOTE — Telephone Encounter (Signed)
Caller name:Minteer, Tracey Relation to MB:BUYZJQ Call back number:[phone] [facility]  Reason for call: wife states you had informed her to let you know if pt needing the antibiotic, pt would like for you to go ahead and call in the antibiotic for him if possible.

## 2014-05-01 NOTE — Telephone Encounter (Signed)
Spoke with the pt's wife and pt would like the ATB.  Rx sent to the pharmacy by e-script.//AB/CMA

## 2014-05-01 NOTE — Telephone Encounter (Signed)
See telephone encounter on (05/01/14).//AB/CMA

## 2014-05-01 NOTE — Telephone Encounter (Signed)
Called and spoke with the pt's wife and informed her of Dr. Frederik Pear recommendation below.  She verbalized understanding but stated that the pt was not at home and she will need to ask if he wanted the ATB.  She said the pt has an appt on Wed.  Wife will call back regarding the ATB.//AB/CMA

## 2014-05-01 NOTE — Telephone Encounter (Signed)
Patient wife called in for this. Best # [phone]

## 2014-05-02 ENCOUNTER — Ambulatory Visit: Payer: BLUE CROSS/BLUE SHIELD | Admitting: Radiation Oncology

## 2014-05-02 ENCOUNTER — Ambulatory Visit: Payer: BLUE CROSS/BLUE SHIELD | Admitting: Physician Assistant

## 2014-05-03 ENCOUNTER — Ambulatory Visit: Payer: BLUE CROSS/BLUE SHIELD | Admitting: Radiation Oncology

## 2014-05-03 ENCOUNTER — Telehealth: Payer: Self-pay | Admitting: Family Medicine

## 2014-05-03 NOTE — Telephone Encounter (Signed)
PA initiated. Awaiting determination. JG//CMA 

## 2014-05-03 NOTE — Telephone Encounter (Signed)
Caller name: saleh Relation to pt: self Call back number: [phone] [facility] [place]  Reason for call:   Patient requesting a refill of syringes for novolog and test strips

## 2014-05-04 ENCOUNTER — Ambulatory Visit: Payer: BLUE CROSS/BLUE SHIELD | Admitting: Radiation Oncology

## 2014-05-04 MED ORDER — SYRINGE (DISPOSABLE) 3 ML MISC: Status: DC

## 2014-05-04 NOTE — Telephone Encounter (Signed)
Called and spoke with the pt and informed him that he has refills on the pen needles and the strips.  Rx for the disposable syringe was sent to the pharmacy by e-script.//AB/CMA

## 2014-05-07 ENCOUNTER — Ambulatory Visit: Payer: BLUE CROSS/BLUE SHIELD | Admitting: Radiation Oncology

## 2014-05-07 ENCOUNTER — Encounter: Payer: Self-pay | Admitting: Radiation Oncology

## 2014-05-07 NOTE — Progress Notes (Signed)
Dx: Lung metastases C78.00  I called Mr. Whidby to inform him that due to his insurance's rejection of our SBRT plans for his lungs, we will need to re-plan his treatment with a different technique.   He is approved for 3DCRT, but I know that IMRT will be necessary in order to adequately spare his bowel and chest wall for his left lower lung tumor, based on prior treatment planning that occurred in January. This tumor is just above bowel and close to chest wall.  We had attempted a 3DCRT plan which failed to provide satisfactory tumor coverage while sparing bowel and chest wall. Therefore, an SBRT VMAT/IMRT plan was made (but rejected because SBRT is not an approved technique per his insurance).  We will proceed with CT simulation in the near future and apply for approval for IMRT to prevent bowel injury such a colitis, fistula, perforation; and, to prevent chest wall pain/ fibrosis/injury.  Anticipate 3 fractions to the right upper lung, 5 to the left lower lung.  -----------------------------------  [name], MD

## 2014-05-08 ENCOUNTER — Telehealth: Payer: Self-pay | Admitting: *Deleted

## 2014-05-08 ENCOUNTER — Ambulatory Visit: Payer: BLUE CROSS/BLUE SHIELD | Admitting: Radiation Oncology

## 2014-05-08 NOTE — Telephone Encounter (Signed)
Prior authorization for Crestor initiated. Awaiting determination. JG//CMA

## 2014-05-10 ENCOUNTER — Ambulatory Visit: Payer: BLUE CROSS/BLUE SHIELD | Admitting: Radiation Oncology

## 2014-05-11 ENCOUNTER — Ambulatory Visit: Payer: BLUE CROSS/BLUE SHIELD | Admitting: Radiation Oncology

## 2014-05-11 ENCOUNTER — Ambulatory Visit
Admission: RE | Admit: 2014-05-11 | Discharge: 2014-05-11 | Disposition: A | Discharge status: Home / Self Care | Payer: BLUE CROSS/BLUE SHIELD | Source: Ambulatory Visit | Attending: Radiation Oncology | Admitting: Radiation Oncology

## 2014-05-11 DIAGNOSIS — C7801 Secondary malignant neoplasm of right lung: Secondary | ICD-10-CM

## 2014-05-11 DIAGNOSIS — C7802 Secondary malignant neoplasm of left lung: Principal | ICD-10-CM

## 2014-05-11 NOTE — Progress Notes (Signed)
  Radiation Oncology         (336) [phone] ________________________________  Name: NIL XIONG MRN: [id number]  Date: 04/09/2014  DOB: November 20, 1955  COMPLEX CT SIMULATION / TREATMENT PLANNING NOTE   ICD-9-CM ICD-10-CM   1. 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 Therefore, we will proceed with designing a 3D conformal plan.  The patient was positioned on the CT simulator in a complex treatment device custom fitted to their body: A body fix blue bag. The patient's head was in an Accuform.  The patient's arms were over their head. An abdominal compression device was snugly fitted to decrease the patient's intrathoracic movements. High-resolution CT axial imaging with 4D technology and with thin slices was obtained of the chest. I verified that the images were good for treatment planning. Physics was on hand for the simulation due to the highly technical nature of it.  RESPIRATORY MOTION MANAGEMENT SIMULATION  NARRATIVE:  In order to account for effect of respiratory motion on target structures and other organs in the planning and delivery of radiotherapy, this patient underwent respiratory motion management simulation.  To accomplish this, when the patient was brought to the CT simulation planning suite,  CT images with at least 3 different sessions of breathholds were obtained.  The CT images were loaded into the planning software.  Then, using a variety of tools, the target volume and planning target volumes (PTV) were delineated.  Avoidance structures were contoured.  Treatment planning then occurred.  Dose volume histograms were generated and reviewed for each of the requested structure.  The resulting plan was carefully reviewed and approved today.   I contoured the patient's ITVs and increased ITVs with tight margins for the PTVs. I will prescribe  SBRT, 54 Gy in 3 fractions every other day to the right upper lung mass, and 50Gy in 5 fractions every other day to the left lower lobe mass and adjacent nodules.  I spoke with radiology - these nodules could be inflammatory but are equally suspicious for satellite metastatic nodules. They appear to be growing compared to his previous scan.   I requested a DVH of the patient's lungs, target volumes, esophagus, heart, chest wall, bowel, spinal cord and airways for 3D conformal planning.  Cone beam CT scans will be performed prior to each fraction to allow close PTV margins and sparing of normal tissues from high doses.   -----------------------------------  [name], MD

## 2014-05-12 ENCOUNTER — Other Ambulatory Visit: Payer: Self-pay | Admitting: Family Medicine

## 2014-05-22 ENCOUNTER — Ambulatory Visit: Payer: BLUE CROSS/BLUE SHIELD | Admitting: Radiation Oncology

## 2014-05-22 DIAGNOSIS — Z510 Encounter for antineoplastic radiation therapy: Secondary | ICD-10-CM | POA: Diagnosis not present

## 2014-05-23 ENCOUNTER — Ambulatory Visit
Admission: RE | Admit: 2014-05-23 | Discharge: 2014-05-23 | Disposition: A | Discharge status: Home / Self Care | Payer: BLUE CROSS/BLUE SHIELD | Source: Ambulatory Visit | Attending: Radiation Oncology | Admitting: Radiation Oncology

## 2014-05-23 DIAGNOSIS — C7802 Secondary malignant neoplasm of left lung: Principal | ICD-10-CM

## 2014-05-23 DIAGNOSIS — Z510 Encounter for antineoplastic radiation therapy: Secondary | ICD-10-CM | POA: Diagnosis not present

## 2014-05-23 DIAGNOSIS — C7801 Secondary malignant neoplasm of right lung: Secondary | ICD-10-CM

## 2014-05-24 ENCOUNTER — Ambulatory Visit: Payer: BLUE CROSS/BLUE SHIELD | Admitting: Radiation Oncology

## 2014-05-24 ENCOUNTER — Encounter: Payer: BLUE CROSS/BLUE SHIELD | Attending: Family Medicine | Admitting: Nutrition

## 2014-05-25 ENCOUNTER — Ambulatory Visit
Admission: RE | Admit: 2014-05-25 | Discharge: 2014-05-25 | Disposition: A | Discharge status: Home / Self Care | Payer: BLUE CROSS/BLUE SHIELD | Source: Ambulatory Visit | Attending: Radiation Oncology | Admitting: Radiation Oncology

## 2014-05-25 DIAGNOSIS — C7802 Secondary malignant neoplasm of left lung: Principal | ICD-10-CM

## 2014-05-25 DIAGNOSIS — Z510 Encounter for antineoplastic radiation therapy: Secondary | ICD-10-CM | POA: Diagnosis not present

## 2014-05-25 DIAGNOSIS — C7801 Secondary malignant neoplasm of right lung: Secondary | ICD-10-CM

## 2014-05-25 NOTE — Progress Notes (Signed)
   Radiation Oncology         [phone] ________________________________  Name: Steven Ibarra MRN: [id number]  Date: 05/25/2014  DOB: 02-Jan-1956  Stereotactic Body Radiotherapy Treatment Procedure Note   NARRATIVE: Steven Ibarra was brought to the stereotactic radiation treatment machine and placed supine on the CT couch. The patient was set up for stereotactic body radiotherapy on the body fix device.   3D TREATMENT PLANNING AND DOSIMETRY: The patient's radiation plan was reviewed and approved prior to starting treatment. It showed 3-dimensional radiation distributions overlaid onto the planning CT. The [facility] for the target structures as well as the organs at risk were reviewed. The documentation of this is filed in the radiation oncology EMR.   SIMULATION VERIFICATION: The patient underwent CT imaging on the treatment unit. These were carefully aligned to document that the ablative radiation dose would cover the target volume and maximally spare the nearby organs at risk according to the planned distribution.   SPECIAL TREATMENT PROCEDURE: Steven Ibarra received high dose ablative stereotactic body radiotherapy to the planned target volume without unforeseen complications. Treatment was delivered uneventfully. The high doses associated with stereotactic body radiotherapy and the significant potential risks require careful treatment set up and patient monitoring constituting a special treatment procedure.   STEREOTACTIC TREATMENT MANAGEMENT: Following delivery, the patient was evaluated clinically. The patient tolerated treatment without significant acute effects, and was discharged to home in stable condition.   Right upper lobe Fraction: 2  Dose:  36 Gy   Left lower lobe Fraction: 2  Dose:  20 Gy  PLAN: Continue treatment as planned.   ________________________________  Jodelle Gross, MD, PhD

## 2014-05-26 ENCOUNTER — Other Ambulatory Visit: Payer: Self-pay | Admitting: Family Medicine

## 2014-05-28 ENCOUNTER — Encounter: Payer: Self-pay | Admitting: Radiation Oncology

## 2014-05-28 ENCOUNTER — Ambulatory Visit
Admission: RE | Admit: 2014-05-28 | Discharge: 2014-05-28 | Disposition: A | Discharge status: Home / Self Care | Payer: BLUE CROSS/BLUE SHIELD | Source: Ambulatory Visit | Attending: Radiation Oncology | Admitting: Radiation Oncology

## 2014-05-28 VITALS — BP 145/94 | HR 88 | Temp 97.8°F | Resp 20 | Wt 307.4 lb

## 2014-05-28 DIAGNOSIS — C7801 Secondary malignant neoplasm of right lung: Secondary | ICD-10-CM

## 2014-05-28 DIAGNOSIS — C7802 Secondary malignant neoplasm of left lung: Principal | ICD-10-CM

## 2014-05-28 DIAGNOSIS — Z510 Encounter for antineoplastic radiation therapy: Secondary | ICD-10-CM | POA: Diagnosis not present

## 2014-05-28 NOTE — Progress Notes (Signed)
Please see "SBRT TREATMENT note", with weekly tx information. -----------------------------------  [name], MD

## 2014-05-28 NOTE — Progress Notes (Addendum)
Patient denies pain, fatigue, difficulty swallowing, SOB, fatigue. He has productive morning cough with clear sputum. He states his legs feel weak. Patient states he has not taken his BP medications today. Advised he may become fatigued with treatments but do to expect him to have skin irritation or throat irritation. Advised he inform this RN if he develops any issues. Patient and wife verbalized understanding.

## 2014-05-28 NOTE — Progress Notes (Signed)
   Weekly Management Note:  Outpatient    ICD-9-CM ICD-10-CM   1. Malignant neoplasm metastatic to both lungs 197.0 C78.01     C78.02     Current Dose:  54Gy to RUL, 30 Gy to LLL  Projected Dose: 54 Gy  To RUL, 50 Gy to LLL  Narrative:  The patient presents for routine under treatment assessment.  CBCT/MVCT images/Port film x-rays were reviewed.  The chart was checked. Muscle weakness, subjective, with walking. Recently had a cold and has a residual cough, clear sputum. No other complaints  Physical Findings:  vitals were not taken for this visit.  Wt Readings from Last 3 Encounters:  03/19/14 306 lb (138.801 kg)  03/16/14 306 lb 3.2 oz (138.891 kg)  03/09/14 292 lb 8.8 oz (132.7 kg)   NAD, leg (hip flexion) strength intact  Impression:  The patient is tolerating radiotherapy.  Plan:  Continue radiotherapy as planned. Exercise as tolerated for conditioning. Will f/u in 51mo w/ CT scan after completing RT  ________________________________   [name], M.D.

## 2014-05-29 ENCOUNTER — Telehealth: Payer: Self-pay | Admitting: *Deleted

## 2014-05-29 NOTE — Telephone Encounter (Signed)
CALLED PATIENT TO INFORM OF CT AND FU, NO ANSWER WILL CALL LATER.

## 2014-05-30 ENCOUNTER — Ambulatory Visit
Admission: RE | Admit: 2014-05-30 | Discharge: 2014-05-30 | Disposition: A | Discharge status: Home / Self Care | Payer: BLUE CROSS/BLUE SHIELD | Source: Ambulatory Visit | Attending: Radiation Oncology | Admitting: Radiation Oncology

## 2014-05-30 DIAGNOSIS — C7802 Secondary malignant neoplasm of left lung: Principal | ICD-10-CM

## 2014-05-30 DIAGNOSIS — C7801 Secondary malignant neoplasm of right lung: Secondary | ICD-10-CM

## 2014-05-30 DIAGNOSIS — Z510 Encounter for antineoplastic radiation therapy: Secondary | ICD-10-CM | POA: Diagnosis not present

## 2014-06-01 ENCOUNTER — Ambulatory Visit
Admission: RE | Admit: 2014-06-01 | Discharge: 2014-06-01 | Disposition: A | Discharge status: Home / Self Care | Payer: BLUE CROSS/BLUE SHIELD | Source: Ambulatory Visit | Attending: Radiation Oncology | Admitting: Radiation Oncology

## 2014-06-01 ENCOUNTER — Encounter: Payer: Self-pay | Admitting: Radiation Oncology

## 2014-06-01 DIAGNOSIS — C7802 Secondary malignant neoplasm of left lung: Principal | ICD-10-CM

## 2014-06-01 DIAGNOSIS — C7801 Secondary malignant neoplasm of right lung: Secondary | ICD-10-CM

## 2014-06-01 DIAGNOSIS — Z510 Encounter for antineoplastic radiation therapy: Secondary | ICD-10-CM | POA: Diagnosis not present

## 2014-06-01 NOTE — Progress Notes (Signed)
   Radiation Oncology         [phone]) [phone] ________________________________  Name: Steven Ibarra MRN: [id number]  Date: 06/01/2014  DOB: 01-06-1956  Stereotactic Body Radiotherapy Treatment Procedure Note   NARRATIVE: Steven Ibarra was brought to the stereotactic radiation treatment machine and placed supine on the CT couch. The patient was set up for stereotactic body radiotherapy on the body fix device.   3D TREATMENT PLANNING AND DOSIMETRY: The patient's radiation plan was reviewed and approved prior to starting treatment. It showed 3-dimensional radiation distributions overlaid onto the planning CT. The [facility] for the target structures as well as the organs at risk were reviewed. The documentation of this is filed in the radiation oncology EMR.   SIMULATION VERIFICATION: The patient underwent CT imaging on the treatment unit. These were carefully aligned to document that the ablative radiation dose would cover the target volume and maximally spare the nearby organs at risk according to the planned distribution.   SPECIAL TREATMENT PROCEDURE: Steven Ibarra received high dose ablative stereotactic body radiotherapy to the planned target volume without unforeseen complications. Treatment was delivered uneventfully. The high doses associated with stereotactic body radiotherapy and the significant potential risks require careful treatment set up and patient monitoring constituting a special treatment procedure.   STEREOTACTIC TREATMENT MANAGEMENT: Following delivery, the patient was evaluated clinically. The patient tolerated treatment without significant acute effects, and was discharged to home in stable condition.   Fraction: 5  Dose:  50 Gy  PLAN: The patient will follow-up in 1 month.   ________________________________  Jodelle Gross, MD, PhD

## 2014-06-03 NOTE — Progress Notes (Addendum)
  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     Plan: The patient has completed radiation treatment. The patient will return to radiation oncology clinic for routine followup in 6 weeks following CT imaging. I advised them to call or return sooner if they have any questions or concerns related to their recovery or treatment.  -----------------------------------  [name], MD

## 2014-06-04 ENCOUNTER — Encounter: Payer: BC Managed Care – PPO | Admitting: Family Medicine

## 2014-06-13 ENCOUNTER — Other Ambulatory Visit: Payer: Self-pay | Admitting: Family Medicine

## 2014-06-23 ENCOUNTER — Other Ambulatory Visit: Payer: Self-pay | Admitting: Family Medicine

## 2014-07-05 ENCOUNTER — Other Ambulatory Visit: Payer: Self-pay | Admitting: Family Medicine

## 2014-07-17 ENCOUNTER — Other Ambulatory Visit: Payer: Self-pay | Admitting: Family Medicine

## 2014-07-18 ENCOUNTER — Ambulatory Visit (HOSPITAL_COMMUNITY)
Admission: RE | Admit: 2014-07-18 | Discharge: 2014-07-18 | Disposition: A | Discharge status: Home / Self Care | Payer: BLUE CROSS/BLUE SHIELD | Source: Ambulatory Visit | Attending: Radiation Oncology | Admitting: Radiation Oncology

## 2014-07-18 ENCOUNTER — Encounter (HOSPITAL_COMMUNITY): Payer: Self-pay

## 2014-07-18 ENCOUNTER — Ambulatory Visit (HOSPITAL_COMMUNITY): Payer: BLUE CROSS/BLUE SHIELD

## 2014-07-18 DIAGNOSIS — C7802 Secondary malignant neoplasm of left lung: Secondary | ICD-10-CM | POA: Diagnosis not present

## 2014-07-18 DIAGNOSIS — C7801 Secondary malignant neoplasm of right lung: Secondary | ICD-10-CM | POA: Insufficient documentation

## 2014-07-18 LAB — POCT I-STAT CREATININE: Creatinine, Ser: 1.1 mg/dL (ref 0.50–1.35)

## 2014-07-18 MED ORDER — IOHEXOL 300 MG/ML  SOLN
80.0000 mL | Freq: Once | INTRAMUSCULAR | Status: AC | PRN
  Administered 2014-07-18: 80 mL via INTRAVENOUS

## 2014-07-20 ENCOUNTER — Telehealth: Payer: Self-pay | Admitting: Family Medicine

## 2014-07-20 NOTE — Telephone Encounter (Signed)
Caller name: Dresden, Lozito  Relation to pt: self  Call back number: [phone] [id number] [place], [phone](Phone) [phone](Fax)         Reason for call:  Pt states coughing chest congestion & coughing phelm. Requesting a RX

## 2014-07-23 ENCOUNTER — Telehealth: Payer: Self-pay | Admitting: Family Medicine

## 2014-07-23 MED ORDER — AMOXICILLIN 500 MG PO TABS: 500.0000 mg | ORAL_TABLET | Freq: Three times a day (TID) | ORAL | Status: DC

## 2014-07-23 MED ORDER — INSULIN ASPART 100 UNIT/ML ~~LOC~~ SOLN: SUBCUTANEOUS | Status: DC

## 2014-07-23 NOTE — Telephone Encounter (Signed)
[facility] [place] and patient informed refill request

## 2014-07-23 NOTE — Telephone Encounter (Signed)
Caller name: tayten Relation to pt: self Call back number:  [phone] [facility] [place]  Reason for call:   Requesting novolog refill. Will be taking last inj this evening.

## 2014-07-23 NOTE — Telephone Encounter (Signed)
This fella has cancer. Does he feel he needs to be seen? Is he having any fever? I am willing to give him a course of Amoxicillin '500mg'$  po tid x 10 days if no improvement or worse needs to come in.

## 2014-07-23 NOTE — Telephone Encounter (Signed)
Trying to clear up with OTC and is not running a fever.  He is ok to scheduled appt. Once completing the antibiotic.  Sent in to his local pharmacy

## 2014-07-25 ENCOUNTER — Ambulatory Visit
Admission: RE | Admit: 2014-07-25 | Discharge: 2014-07-25 | Disposition: A | Discharge status: Home / Self Care | Payer: BLUE CROSS/BLUE SHIELD | Source: Ambulatory Visit | Attending: Radiation Oncology | Admitting: Radiation Oncology

## 2014-07-25 ENCOUNTER — Telehealth: Payer: Self-pay | Admitting: *Deleted

## 2014-07-25 ENCOUNTER — Encounter: Payer: Self-pay | Admitting: Radiation Oncology

## 2014-07-25 VITALS — BP 151/81 | HR 90 | Temp 97.8°F | Resp 20 | Wt 309.8 lb

## 2014-07-25 DIAGNOSIS — C7802 Secondary malignant neoplasm of left lung: Principal | ICD-10-CM

## 2014-07-25 DIAGNOSIS — C7801 Secondary malignant neoplasm of right lung: Secondary | ICD-10-CM

## 2014-07-25 DIAGNOSIS — R5383 Other fatigue: Secondary | ICD-10-CM

## 2014-07-25 NOTE — Telephone Encounter (Signed)
CALLED PATIENT TO INFORM OF LAB, TEST AND FU, LVM FOR A RETURN CALL 

## 2014-07-25 NOTE — Progress Notes (Signed)
Radiation Oncology         [phone] ________________________________  Name: Steven Ibarra MRN: [id number]  Date: 07/25/2014  DOB: 22-Mar-1956  Follow-Up Visit Note  Outpatient  CC: Penni Homans, MD  Melissa Montane, MD  Diagnosis and Prior Radiotherapy:    ICD-9-CM ICD-10-CM   1. 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  Denies chest wall pain. Feels slight pain in the lower right chest while doing strenuous activity. No change in breathing. Denies facial pain and tingles.  Pain Status: none  Weight changes, if any: stable same  Nutritional Status  a) intake: 80-90 % better, eating everything,  b) using a feeding tube?: none  c) weight changes, if any: none  Swallowing Status: bread a little hard to get down,othrwise no difficlties  Dental (if applicable): When was last visit with dentistry Last year needs to schedule appt,  Using fluoride trays daily? NO  When was last ENT visit? 03/13/14  When is next ENT visit? Not scheduled  Other notable issues, if any: skin well healed, no nausea, has sinus issues/allergies, taking amoxicillin 07/23/14 , and advil sinus cold OTC bid                             ALLERGIES:  has No Known Allergies.  Meds: Current Outpatient Prescriptions    Medication Sig Dispense Refill  . amoxicillin (AMOXIL) 500 MG tablet Take 1 tablet (500 mg total) by mouth 3 (three) times daily. For 10 days 30 tablet 0  . Ascorbic Acid (VITAMIN C) 1000 MG tablet Take 1,000 mg by mouth daily.    . B-D UF III MINI PEN NEEDLES 31G X 5 MM MISC USE TWICE DAILY AS DIRECTED WITH LANTUS DX: 250.0 100 each 2  . cetirizine (ZYRTEC) 10 MG tablet Take 10 mg by mouth daily as needed for allergies.     Marland Kitchen CRESTOR 20 MG tablet TAKE 1 TABLET EVERY DAY 30 tablet 4  . fenofibrate 160 MG tablet Take 160 mg by mouth daily.   2  . ibuprofen (ADVIL,MOTRIN) 200 MG tablet Take 600 mg by mouth every 6 (six) hours as needed for pain.     Marland Kitchen insulin aspart (NOVOLOG) 100 UNIT/ML injection 15 units tid for high blood sugar. Test daily after dinner and see sliding scale for blood sugar  Please dispense the amount pt needs for 1 month 10 mL 4  . Krill Oil 300 MG CAPS Take 1 capsule by mouth daily.    Marland Kitchen LANTUS SOLOSTAR 100 UNIT/ML Solostar Pen INJECT 100 UNITS IN MORNING AND 60 UNITS IN EVENING 60 pen 1  . levothyroxine (SYNTHROID, LEVOTHROID) 75 MCG tablet TAKE 1 TABLET (75 MCG TOTAL) BY  MOUTH DAILY. 30 tablet 1  . losartan (COZAAR) 50 MG tablet Take 1 tablet (50 mg total) by mouth daily. 30 tablet 5  . metFORMIN (GLUCOPHAGE) 500 MG tablet 2 tabs po bid and 1 tab po q noon 150 tablet 5  . Misc Natural Products (OSTEO BI-FLEX TRIPLE STRENGTH PO) Take 2 tablets by mouth daily.    . NON FORMULARY 1 each by Other route 4 (four) times daily. Accu-check multiclix lancets- use as directed    . Probiotic Product (PROBIOTIC DAILY PO) Take by mouth.    . Pseudoephedrine-Ibuprofen 30-200 MG TABS Take 1 tablet by mouth 2 (two) times daily.    . ranitidine (ZANTAC) 300 MG tablet Take 1 tablet (300 mg total) by mouth at bedtime. 30 tablet 3  . Syringe, Disposable, 3 ML MISC DX: E11.9/E11.65  Use bid as directed 100 each 2  . ACCU-CHEK AVIVA PLUS test strip USE AS DIRECTED 100 each 2  . ACCU-CHEK  FASTCLIX LANCETS MISC USE AS DIRECTED 102 each 1  . ACCU-CHEK FASTCLIX LANCETS MISC USE AS DIRECTED 102 each 1  . aspirin 81 MG tablet Take 81 mg by mouth daily.     No current facility-administered medications for this encounter.    Physical Findings: The patient is in no acute distress. Patient is alert and oriented. General: Alert and oriented, in no acute distress HEENT: Head is normocephalic. Extraocular movements are intact. Oropharynx is clear. Neck: Neck is supple, no palpable cervical or supraclavicular lymphadenopathy. Heart: Regular in rate and rhythm with no murmurs, rubs, or gallops. Chest: Clear to auscultation bilaterally, with no rhonchi, wheezes, or rales. Abdomen: Soft, nontender, nondistended, with no rigidity or guarding. Normoactive. Extremities: No cyanosis or edema. Lymphatics: see Neck Exam Skin: No concerning lesions. Musculoskeletal: symmetric strength and muscle tone throughout. Neurologic: no facial numbness. No obvious focalities. Speech is fluent.  Psychiatric: Judgment and insight are intact. Affect is appropriate.    weight is 309 lb 12.8 oz (140.524 kg). His oral temperature is 97.8 F (36.6 C). His blood pressure is 151/81 and his pulse is 90. His respiration is 20 and oxygen saturation is 96%. .      Lab Findings: Lab Results  Component Value Date   WBC 4.3 03/16/2014   HGB 13.4 03/16/2014   HCT 39.6 03/16/2014   MCV 85.8 03/16/2014   PLT 172.0 03/16/2014   Lab Results  Component Value Date   TSH 2.24 12/19/2013    Radiographic Findings: Ct Chest W Contrast  07/18/2014   CLINICAL DATA:  Followup lung cancer  EXAM: CT CHEST WITH CONTRAST  TECHNIQUE: Multidetector CT imaging of the chest was performed during intravenous contrast administration.  CONTRAST:  19m OMNIPAQUE IOHEXOL 300 MG/ML  SOLN  COMPARISON:  02/12/2014  FINDINGS: Mediastinum: Mild cardiac enlargement. There is calcified atherosclerotic plaque within the thoracic aorta as  well as the LAD Coronary artery. Patulous esophagus noted. The trachea appears patent and is midline. No enlarged mediastinal or hilar lymph nodes identified. No axillary or supraclavicular adenopathy.  Lungs/Pleura: Pulmonary nodule within the right upper lobe measures 1.2 cm, image 28/series 5. This is compared with 1.6 cm previously. Near complete resolution of previous lingular mass. There is residual, linear scar like opacity identified on today's exam, image 42/series 5. There is a left lower lobe subpleural nodule identified measuring 5 mm, image 44/series 5. Previously there was a faint 3 mm nodule in this area. There is a small anterior right upper lobe nodule which measures 4 mm,  image 20/series 5. Previously 2 mm. New subpleural nodule in the right upper lobe measures 6 mm.  Upper Abdomen: No suspicious liver abnormality. The gallbladder appears normal. Normal appearance of the pancreas.  Musculoskeletal: Review of the visualized osseous structures is negative for aggressive lytic or sclerotic bone lesions.  IMPRESSION: 1. The two dominant lung lesions within the right upper lobe and lingular portion of left lung have both decreased in size from the previous exam. 2. There are three small nodules which were barely visible on previous exam and have increased in size in the interval now measuring up to 5 mm. Attention to these nodules on followup imaging. 3. Cardiac enlargement and atherosclerotic disease including LAD coronary artery calcification.   Electronically Signed   By: Kerby Moors M.D.   On: 07/18/2014 11:03    Impression/Plan:  Reviewed CT results with patient.  Excellent response to radiation in the two bilateral lung metastases. There are some small subcentimeter nodules that warrant follow-up with imaging in 3 months, but no treatment at this time. Will order f/u. Patient pleased with this plan.   This document serves as a record of services personally performed by [name], MD. It  was created on her behalf by Darcus Austin, a trained medical scribe. The creation of this record is based on the scribe's personal observations and the provider's statements to them. This document has been checked and approved by the attending provider.    _____________________________________   [name], MD

## 2014-07-25 NOTE — Progress Notes (Addendum)
Pain Status: none  Weight changes, if any: stable same  Nutritional Status a) intake:  80-90 % better, eating everything,  b) using a feeding tube?: none c) weight changes, if any: none  Swallowing Status: bread a little hard to get down,othrwise no difficlties  Dental (if applicable): When was last visit with dentistry  Last year needs to schedule appt,   Using fluoride trays daily? NO   When was last ENT visit? 03/13/14   When is next ENT visit? Not scheduled  Other notable issues, if any: skin well healed, no nausea, has sinus issues/allergies, taking amoxicillin 07/23/14 , and advil sinus cold OTC bid There were no vitals taken for this visit.  Wt Readings from Last 3 Encounters:  03/19/14 306 lb (138.801 kg)  03/16/14 306 lb 3.2 oz (138.891 kg)  03/09/14 292 lb 8.8 oz (132.7 kg)  BP 151/81 mmHg  Pulse 90  Temp(Src) 97.8 F (36.6 C) (Oral)  Resp 20  Wt 309 lb 12.8 oz (140.524 kg)  SpO2 96%

## 2014-07-30 ENCOUNTER — Other Ambulatory Visit: Payer: Self-pay | Admitting: Family Medicine

## 2014-07-30 DIAGNOSIS — I10 Essential (primary) hypertension: Secondary | ICD-10-CM

## 2014-07-30 MED ORDER — LOSARTAN POTASSIUM 50 MG PO TABS: 50.0000 mg | ORAL_TABLET | Freq: Every day | ORAL | Status: DC

## 2014-08-20 ENCOUNTER — Ambulatory Visit (INDEPENDENT_AMBULATORY_CARE_PROVIDER_SITE_OTHER): Payer: BLUE CROSS/BLUE SHIELD | Admitting: Family Medicine

## 2014-08-20 ENCOUNTER — Encounter: Payer: Self-pay | Admitting: Family Medicine

## 2014-08-20 VITALS — BP 144/92 | HR 84 | Temp 98.0°F | Resp 18 | Ht 78.0 in | Wt 309.0 lb

## 2014-08-20 DIAGNOSIS — E782 Mixed hyperlipidemia: Secondary | ICD-10-CM | POA: Diagnosis not present

## 2014-08-20 DIAGNOSIS — K219 Gastro-esophageal reflux disease without esophagitis: Secondary | ICD-10-CM

## 2014-08-20 DIAGNOSIS — E669 Obesity, unspecified: Secondary | ICD-10-CM

## 2014-08-20 DIAGNOSIS — E663 Overweight: Secondary | ICD-10-CM

## 2014-08-20 DIAGNOSIS — I10 Essential (primary) hypertension: Secondary | ICD-10-CM

## 2014-08-20 DIAGNOSIS — E0869 Diabetes mellitus due to underlying condition with other specified complication: Secondary | ICD-10-CM

## 2014-08-20 DIAGNOSIS — E119 Type 2 diabetes mellitus without complications: Secondary | ICD-10-CM

## 2014-08-20 DIAGNOSIS — E1169 Type 2 diabetes mellitus with other specified complication: Secondary | ICD-10-CM

## 2014-08-20 DIAGNOSIS — E039 Hypothyroidism, unspecified: Secondary | ICD-10-CM

## 2014-08-20 LAB — CBC
HEMATOCRIT: 39.8 % (ref 39.0–52.0)
HEMOGLOBIN: 13.9 g/dL (ref 13.0–17.0)
MCHC: 34.8 g/dL (ref 30.0–36.0)
MCV: 86.9 fl (ref 78.0–100.0)
PLATELETS: 168 10*3/uL (ref 150.0–400.0)
RBC: 4.58 Mil/uL (ref 4.22–5.81)
RDW: 14.6 % (ref 11.5–15.5)
WBC: 3.8 10*3/uL — ABNORMAL LOW (ref 4.0–10.5)

## 2014-08-20 LAB — LDL CHOLESTEROL, DIRECT: LDL DIRECT: 40 mg/dL

## 2014-08-20 LAB — COMPREHENSIVE METABOLIC PANEL
ALBUMIN: 3.9 g/dL (ref 3.5–5.2)
ALK PHOS: 42 U/L (ref 39–117)
ALT: 21 U/L (ref 0–53)
AST: 17 U/L (ref 0–37)
BILIRUBIN TOTAL: 0.5 mg/dL (ref 0.2–1.2)
BUN: 16 mg/dL (ref 6–23)
CALCIUM: 8.9 mg/dL (ref 8.4–10.5)
CHLORIDE: 108 meq/L (ref 96–112)
CO2: 25 meq/L (ref 19–32)
Creatinine, Ser: 1.04 mg/dL (ref 0.40–1.50)
GFR: 78.01 mL/min (ref >60.00)
GLUCOSE: 156 mg/dL — AB (ref 70–99)
POTASSIUM: 4.1 meq/L (ref 3.5–5.1)
Sodium: 139 mEq/L (ref 135–145)
Total Protein: 6.1 g/dL (ref 6.0–8.3)

## 2014-08-20 LAB — LIPID PANEL
Cholesterol: 98 mg/dL (ref 0–200)
HDL: 22.4 mg/dL — ABNORMAL LOW (ref >39.00)
NONHDL: 75.6
TRIGLYCERIDES: 261 mg/dL — AB (ref 0.0–149.0)
Total CHOL/HDL Ratio: 4
VLDL: 52.2 mg/dL — ABNORMAL HIGH (ref 0.0–40.0)

## 2014-08-20 LAB — MICROALBUMIN / CREATININE URINE RATIO
Creatinine,U: 196.5 mg/dL
MICROALB/CREAT RATIO: 0.4 mg/g (ref 0.0–30.0)
Microalb, Ur: 0.8 mg/dL (ref 0.0–1.9)

## 2014-08-20 LAB — TSH: TSH: 2.37 u[IU]/mL (ref 0.35–4.50)

## 2014-08-20 MED ORDER — SYRINGE (DISPOSABLE) 3 ML MISC: Status: DC

## 2014-08-20 MED ORDER — RANITIDINE HCL 300 MG PO TABS: 300.0000 mg | ORAL_TABLET | Freq: Every day | ORAL | Status: DC

## 2014-08-20 MED ORDER — LEVOTHYROXINE SODIUM 75 MCG PO TABS: ORAL_TABLET | ORAL | Status: DC

## 2014-08-20 MED ORDER — INSULIN ASPART 100 UNIT/ML ~~LOC~~ SOLN: SUBCUTANEOUS | Status: DC

## 2014-08-20 MED ORDER — ROSUVASTATIN CALCIUM 20 MG PO TABS: 20.0000 mg | ORAL_TABLET | Freq: Every day | ORAL | Status: DC

## 2014-08-20 MED ORDER — METFORMIN HCL 500 MG PO TABS: ORAL_TABLET | ORAL | Status: DC

## 2014-08-20 MED ORDER — INSULIN GLARGINE 100 UNIT/ML SOLOSTAR PEN: PEN_INJECTOR | SUBCUTANEOUS | Status: DC

## 2014-08-20 NOTE — Patient Instructions (Addendum)
Moist heat and Salon Pas patches  nove next appt to early September  Consider dust mite covers, nasal saline or flonase prior to bedtime Back Pain, Adult Low back pain is very common. About 1 in 5 people have back pain.The cause of low back pain is rarely dangerous. The pain often gets better over time.About half of people with a sudden onset of back pain feel better in just 2 weeks. About 8 in 10 people feel better by 6 weeks.  CAUSES Some common causes of back pain include:  Strain of the muscles or ligaments supporting the spine.  Wear and tear (degeneration) of the spinal discs.  Arthritis.  Direct injury to the back. DIAGNOSIS Most of the time, the direct cause of low back pain is not known.However, back pain can be treated effectively even when the exact cause of the pain is unknown.Answering your caregiver's questions about your overall health and symptoms is one of the most accurate ways to make sure the cause of your pain is not dangerous. If your caregiver needs more information, he or she may order lab work or imaging tests (X-rays or MRIs).However, even if imaging tests show changes in your back, this usually does not require surgery. HOME CARE INSTRUCTIONS For many people, back pain returns.Since low back pain is rarely dangerous, it is often a condition that people can learn to [facility] their own.   Remain active. It is stressful on the back to sit or stand in one place. Do not sit, drive, or stand in one place for more than 30 minutes at a time. Take short walks on level surfaces as soon as pain allows.Try to increase the length of time you walk each day.  Do not stay in bed.Resting more than 1 or 2 days can delay your recovery.  Do not avoid exercise or work.Your body is made to move.It is not dangerous to be active, even though your back may hurt.Your back will likely heal faster if you return to being active before your pain is gone.  Pay attention to your  body when you bend and lift. Many people have less discomfortwhen lifting if they bend their knees, keep the load close to their bodies,and avoid twisting. Often, the most comfortable positions are those that put less stress on your recovering back.  Find a comfortable position to sleep. Use a firm mattress and lie on your side with your knees slightly bent. If you lie on your back, put a pillow under your knees.  Only take over-the-counter or prescription medicines as directed by your caregiver. Over-the-counter medicines to reduce pain and inflammation are often the most helpful.Your caregiver may prescribe muscle relaxant drugs.These medicines help dull your pain so you can more quickly return to your normal activities and healthy exercise.  Put ice on the injured area.  Put ice in a plastic bag.  Place a towel between your skin and the bag.  Leave the ice on for 15-20 minutes, 03-04 times a day for the first 2 to 3 days. After that, ice and heat may be alternated to reduce pain and spasms.  Ask your caregiver about trying back exercises and gentle massage. This may be of some benefit.  Avoid feeling anxious or stressed.Stress increases muscle tension and can worsen back pain.It is important to recognize when you are anxious or stressed and learn ways to manage it.Exercise is a great option. SEEK MEDICAL CARE IF:  You have pain that is not relieved with rest or  medicine.  You have pain that does not improve in 1 week.  You have new symptoms.  You are generally not feeling well. SEEK IMMEDIATE MEDICAL CARE IF:   You have pain that radiates from your back into your legs.  You develop new bowel or bladder control problems.  You have unusual weakness or numbness in your arms or legs.  You develop nausea or vomiting.  You develop abdominal pain.  You feel faint. Document Released: 03/16/2005 Document Revised: 09/15/2011 Document Reviewed: 07/18/2013 [facility] Patient  Information 2015 [place], [place]. This information is not intended to replace advice given to you by your health care provider. Make sure you discuss any questions you have with your health care provider.

## 2014-08-20 NOTE — Progress Notes (Signed)
Pre visit review using our clinic review tool, if applicable. No additional management support is needed unless otherwise documented below in the visit note. 

## 2014-08-26 ENCOUNTER — Other Ambulatory Visit: Payer: Self-pay | Admitting: Family Medicine

## 2014-08-27 ENCOUNTER — Encounter: Payer: Self-pay | Admitting: Family Medicine

## 2014-08-27 NOTE — Assessment & Plan Note (Signed)
Well controlled, no changes to meds. Encouraged heart healthy diet such as the DASH diet and exercise as tolerated.  °

## 2014-08-27 NOTE — Progress Notes (Signed)
Steven Ibarra  [id number] 1956-02-06 08/27/2014      Progress Note-Follow Up  Subjective  Chief Complaint  Chief Complaint  Patient presents with  . Follow-up    A1C, BP and Cholesterol check    HPI  Patient is a 59 y.o. male in today for routine medical care. Patient in today for follow up. Has been struggling with fatigue and depression due to his recurrent  Cancer. His most recent course of radiation shrunk his lymph noes, he has a repeat CAT scan scheduled for July 2016. He is struggling with fatigue and malaise. Acknowledges depression is becoming issue. He notes his blood sugars have been running from 100-220. He denies polyuria or polydipsia. He does have some intermittent trouble with left-sided pain left flank mostly. Worse when he lies on it. No change in bowel habits or other acute concerns. Denies CP/palp/SOB/HA/congestion/fevers/GI or GU c/o. Taking meds as prescribed  Past Medical History  Diagnosis Date  . Hyperlipidemia   . Obesity   . Overweight(278.02) 10/23/2009  . Mixed hyperlipidemia 10/23/2009  . ELEVATED BLOOD PRESSURE 04/23/2010  . DYSPNEA ON EXERTION 10/23/2009  . ALLERGIC RHINITIS, SEASONAL 10/23/2009  . OSA (obstructive sleep apnea)   . SLEEP APNEA, OBSTRUCTIVE 10/23/2009    Sleep study Done at [facility]  . Hearing loss   . Hypertension     Does not see a cardiologist, has not had a stress, echo   . GERD (gastroesophageal reflux disease)   . S/P radiation therapy 07/06/11 - 08/19/11    Right Facial and Right Neck Nodes and right Trigeminal  Nerve to Base of Skull/ Total Dose 6600 cGy/ 33 Fractions  . Preventative health care 09/30/2011  . Hypothyroid 01/28/2012  . Otitis externa of right ear 07/06/2012  . Pulled muscle 07/06/12  . Cancer   . Skin cancer     on nose, 9-10 yrs ago  . Basal cell carcinoma of neck 05/28/2011    r suprahyoid, radical neck dissection S/p 6 weeks of targeted radiation therapy   . Lung cancer 03/08/14    invasive squamous cell  carcinoma  . Diabetes mellitus approx 2003    type 2  . DIABETES MELLITUS, TYPE II 10/23/2009  . Type II or unspecified type diabetes mellitus with unspecified complication, uncontrolled     type 2     Past Surgical History  Procedure Laterality Date  . Tonsillectomy and adenoidectomy    . Skin cancer removal    . Radical neck dissection  05/28/2011    Procedure: RADICAL NECK DISSECTION;  Surgeon: Melissa Montane, MD;  Location: [place];  Service: ENT;  Laterality: N/A;  Suprahyoid Neck Dissection  . Lung biopsy Right 03/08/2014    Family History  Problem Relation Age of Onset  . Other Mother     CHF  . Stroke Father   . Hyperlipidemia Father   . Heart disease Father     s/p valve replacement  . Hyperlipidemia Brother   . Obesity Brother   . Other Brother     Back pain  . Hyperlipidemia Brother   . Hypertension Brother   . Other Brother     Panic attacks  . Hyperlipidemia Brother   . Anesthesia problems Neg Hx     History   Social History  . Marital Status: Married    Spouse Name: N/A  . Number of Children: N/A  . Years of Education: N/A   Occupational History  . Not on file.   Social  History Main Topics  . Smoking status: Former Smoker -- 1.00 packs/day for 9 years    Types: Cigarettes    Quit date: 03/31/1975  . Smokeless tobacco: Never Used  . Alcohol Use: No     Comment: 4 beers a month, 06/12/11 rarely uses now  . Drug Use: No  . Sexual Activity: Yes   Other Topics Concern  . Not on file   Social History Narrative   Patient is married.   Patient with a history of smoking one pack per day for approximately 29 years from ages of [age]. Patient denies ever having used smokeless tobacco. Patient with rare use of alcohol.      Mother died at age 71 from congestive heart failure complications. Father died at the age of 47 secondary to a stroke.    Current Outpatient Prescriptions on File Prior to Visit  Medication Sig Dispense Refill  . ACCU-CHEK AVIVA PLUS  test strip USE AS DIRECTED 100 each 2  . ACCU-CHEK FASTCLIX LANCETS MISC USE AS DIRECTED 102 each 1  . ACCU-CHEK FASTCLIX LANCETS MISC USE AS DIRECTED 102 each 1  . amoxicillin (AMOXIL) 500 MG tablet Take 1 tablet (500 mg total) by mouth 3 (three) times daily. For 10 days 30 tablet 0  . Ascorbic Acid (VITAMIN C) 1000 MG tablet Take 1,000 mg by mouth daily.    Marland Kitchen aspirin 81 MG tablet Take 81 mg by mouth daily.    . B-D UF III MINI PEN NEEDLES 31G X 5 MM MISC USE TWICE DAILY AS DIRECTED WITH LANTUS DX: 250.0 100 each 2  . cetirizine (ZYRTEC) 10 MG tablet Take 10 mg by mouth daily as needed for allergies.     . fenofibrate 160 MG tablet Take 160 mg by mouth daily.   2  . ibuprofen (ADVIL,MOTRIN) 200 MG tablet Take 600 mg by mouth every 6 (six) hours as needed for pain.     Javier Docker Oil 300 MG CAPS Take 1 capsule by mouth daily.    Marland Kitchen losartan (COZAAR) 50 MG tablet Take 1 tablet (50 mg total) by mouth daily. 30 tablet 5  . Misc Natural Products (OSTEO BI-FLEX TRIPLE STRENGTH PO) Take 2 tablets by mouth daily.    . NON FORMULARY 1 each by Other route 4 (four) times daily. Accu-check multiclix lancets- use as directed    . Probiotic Product (PROBIOTIC DAILY PO) Take by mouth.    . Pseudoephedrine-Ibuprofen 30-200 MG TABS Take 1 tablet by mouth 2 (two) times daily.     No current facility-administered medications on file prior to visit.    No Known Allergies  Review of Systems  Review of Systems  Constitutional: Positive for malaise/fatigue. Negative for fever.  HENT: Negative for congestion.   Eyes: Negative for discharge.  Respiratory: Negative for shortness of breath.   Cardiovascular: Negative for chest pain, palpitations and leg swelling.  Gastrointestinal: Negative for nausea, abdominal pain and diarrhea.  Genitourinary: Positive for frequency. Negative for dysuria.  Musculoskeletal: Negative for falls.  Skin: Negative for rash.  Neurological: Negative for loss of consciousness and  headaches.  Endo/Heme/Allergies: Negative for polydipsia.  Psychiatric/Behavioral: Negative for depression and suicidal ideas. The patient is not nervous/anxious and does not have insomnia.     Objective  BP 144/92 mmHg  Pulse 84  Temp(Src) 98 F (36.7 C) (Oral)  Resp 18  Ht '6\' 6"'$  (1.981 m)  Wt 309 lb (140.161 kg)  BMI 35.72 kg/m2  SpO2 98%  Physical Exam  Physical Exam  Constitutional: He is oriented to person, place, and time and well-developed, well-nourished, and in no distress. No distress.  HENT:  Head: Normocephalic and atraumatic.  Eyes: Conjunctivae are normal.  Neck: Neck supple. No thyromegaly present.  Cardiovascular: Normal rate, regular rhythm and normal heart sounds.   No murmur heard. Pulmonary/Chest: Effort normal and breath sounds normal. No respiratory distress.  Abdominal: He exhibits no distension and no mass. There is no tenderness.  Musculoskeletal: He exhibits no edema.  Neurological: He is alert and oriented to person, place, and time.  Skin: Skin is warm.  Psychiatric: Memory, affect and judgment normal.    Lab Results  Component Value Date   TSH 2.37 08/20/2014   Lab Results  Component Value Date   WBC 3.8* 08/20/2014   HGB 13.9 08/20/2014   HCT 39.8 08/20/2014   MCV 86.9 08/20/2014   PLT 168.0 08/20/2014   Lab Results  Component Value Date   CREATININE 1.04 08/20/2014   BUN 16 08/20/2014   NA 139 08/20/2014   K 4.1 08/20/2014   CL 108 08/20/2014   CO2 25 08/20/2014   Lab Results  Component Value Date   ALT 21 08/20/2014   AST 17 08/20/2014   ALKPHOS 42 08/20/2014   BILITOT 0.5 08/20/2014   Lab Results  Component Value Date   CHOL 98 08/20/2014   Lab Results  Component Value Date   HDL 22.40* 08/20/2014   Lab Results  Component Value Date   LDLCALC 18 09/08/2013   Lab Results  Component Value Date   TRIG 261.0* 08/20/2014   Lab Results  Component Value Date   CHOLHDL 4 08/20/2014     Assessment &  Plan  Hypertension Well controlled, no changes to meds. Encouraged heart healthy diet such as the DASH diet and exercise as tolerated.    GERD (gastroesophageal reflux disease) Avoid offending foods, start probiotics. Do not eat large meals in late evening and consider raising head of bed.    Diabetes mellitus type 2 in obese hgba1c acceptable, minimize simple carbs. Increase exercise as tolerated. Continue current meds   MIXED HYPERLIPIDEMIA Tolerating statin, encouraged heart healthy diet, avoid trans fats, minimize simple carbs and saturated fats. Increase exercise as tolerated   Overweight Encouraged DASH diet, decrease po intake and increase exercise as tolerated. Needs 7-8 hours of sleep nightly. Avoid trans fats, eat small, frequent meals every 4-5 hours with lean proteins, complex carbs and healthy fats. Minimize simple carbs, GMO foods.

## 2014-08-27 NOTE — Assessment & Plan Note (Signed)
hgba1c acceptable, minimize simple carbs. Increase exercise as tolerated. Continue current meds 

## 2014-08-27 NOTE — Assessment & Plan Note (Signed)
Avoid offending foods, start probiotics. Do not eat large meals in late evening and consider raising head of bed.  

## 2014-08-27 NOTE — Assessment & Plan Note (Signed)
Tolerating statin, encouraged heart healthy diet, avoid trans fats, minimize simple carbs and saturated fats. Increase exercise as tolerated 

## 2014-08-27 NOTE — Assessment & Plan Note (Signed)
Encouraged DASH diet, decrease po intake and increase exercise as tolerated. Needs 7-8 hours of sleep nightly. Avoid trans fats, eat small, frequent meals every 4-5 hours with lean proteins, complex carbs and healthy fats. Minimize simple carbs, GMO foods. 

## 2014-08-31 ENCOUNTER — Ambulatory Visit: Payer: BC Managed Care – PPO | Admitting: Radiation Oncology

## 2014-09-01 ENCOUNTER — Other Ambulatory Visit: Payer: Self-pay | Admitting: Family Medicine

## 2014-09-04 ENCOUNTER — Encounter: Payer: Self-pay | Admitting: Family Medicine

## 2014-09-04 LAB — HM DIABETES EYE EXAM

## 2014-09-05 ENCOUNTER — Telehealth: Payer: Self-pay | Admitting: *Deleted

## 2014-09-05 NOTE — Telephone Encounter (Signed)
Pt dropped off wellness form needed for insurance. Completed and forwarded to Dr. Charlett Blake. JG//CMA

## 2014-09-10 NOTE — Telephone Encounter (Signed)
Completed form faxed to number on form. Original mailed to pt and copy sent for scanning. JG//CMA

## 2014-09-21 ENCOUNTER — Other Ambulatory Visit: Payer: Self-pay | Admitting: Family Medicine

## 2014-09-25 ENCOUNTER — Encounter: Payer: Self-pay | Admitting: Radiation Oncology

## 2014-09-25 NOTE — Progress Notes (Signed)
Received [employer] paperwork from patient, forwarded to RN for processing

## 2014-09-30 ENCOUNTER — Other Ambulatory Visit: Payer: Self-pay | Admitting: Family Medicine

## 2014-10-02 ENCOUNTER — Encounter: Payer: Self-pay | Admitting: Radiation Oncology

## 2014-10-02 NOTE — Progress Notes (Signed)
Rec'd back completed FMLA forms. Originals are mailed back to the patient after faxing to [employer] benefits. Copies to be scanned into Epic.

## 2014-10-10 ENCOUNTER — Ambulatory Visit (HOSPITAL_BASED_OUTPATIENT_CLINIC_OR_DEPARTMENT_OTHER)
Admission: RE | Admit: 2014-10-10 | Discharge: 2014-10-10 | Disposition: A | Discharge status: Home / Self Care | Payer: BLUE CROSS/BLUE SHIELD | Source: Ambulatory Visit | Attending: Radiation Oncology | Admitting: Radiation Oncology

## 2014-10-10 ENCOUNTER — Encounter (HOSPITAL_COMMUNITY): Payer: Self-pay

## 2014-10-10 ENCOUNTER — Ambulatory Visit (HOSPITAL_COMMUNITY)
Admission: RE | Admit: 2014-10-10 | Discharge: 2014-10-10 | Disposition: A | Discharge status: Home / Self Care | Payer: BLUE CROSS/BLUE SHIELD | Source: Ambulatory Visit | Attending: Radiation Oncology | Admitting: Radiation Oncology

## 2014-10-10 DIAGNOSIS — Z85828 Personal history of other malignant neoplasm of skin: Secondary | ICD-10-CM | POA: Insufficient documentation

## 2014-10-10 DIAGNOSIS — C7802 Secondary malignant neoplasm of left lung: Secondary | ICD-10-CM | POA: Insufficient documentation

## 2014-10-10 DIAGNOSIS — K760 Fatty (change of) liver, not elsewhere classified: Secondary | ICD-10-CM | POA: Diagnosis not present

## 2014-10-10 DIAGNOSIS — Z923 Personal history of irradiation: Secondary | ICD-10-CM | POA: Diagnosis not present

## 2014-10-10 DIAGNOSIS — C7801 Secondary malignant neoplasm of right lung: Secondary | ICD-10-CM

## 2014-10-10 DIAGNOSIS — R918 Other nonspecific abnormal finding of lung field: Secondary | ICD-10-CM | POA: Diagnosis not present

## 2014-10-10 DIAGNOSIS — R5383 Other fatigue: Secondary | ICD-10-CM | POA: Diagnosis not present

## 2014-10-10 DIAGNOSIS — Z08 Encounter for follow-up examination after completed treatment for malignant neoplasm: Secondary | ICD-10-CM | POA: Diagnosis present

## 2014-10-10 LAB — BUN AND CREATININE (CC13)
BUN: 20.3 mg/dL (ref 7.0–26.0)
Creatinine: 1.1 mg/dL (ref 0.7–1.3)
EGFR: 72 mL/min/{1.73_m2} — ABNORMAL LOW (ref >90)

## 2014-10-10 LAB — TSH CHCC: TSH: 3.074 m[IU]/L (ref 0.320–4.118)

## 2014-10-10 MED ORDER — IOHEXOL 300 MG/ML  SOLN
100.0000 mL | Freq: Once | INTRAMUSCULAR | Status: AC | PRN
  Administered 2014-10-10: 80 mL via INTRAVENOUS

## 2014-10-12 ENCOUNTER — Ambulatory Visit
Admission: RE | Admit: 2014-10-12 | Discharge: 2014-10-12 | Disposition: A | Discharge status: Home / Self Care | Payer: BLUE CROSS/BLUE SHIELD | Source: Ambulatory Visit | Attending: Radiation Oncology | Admitting: Radiation Oncology

## 2014-10-12 ENCOUNTER — Encounter: Payer: Self-pay | Admitting: Radiation Oncology

## 2014-10-12 VITALS — BP 147/88 | HR 89 | Temp 97.6°F | Resp 16 | Ht 78.0 in | Wt 306.2 lb

## 2014-10-12 DIAGNOSIS — C7802 Secondary malignant neoplasm of left lung: Principal | ICD-10-CM

## 2014-10-12 DIAGNOSIS — C7801 Secondary malignant neoplasm of right lung: Secondary | ICD-10-CM

## 2014-10-12 NOTE — Progress Notes (Signed)
Rosanne Ashing here for follow up. He denies pain.  He reports trouble occasionally swallowing bread.  He reports continuing to have a dry mouth.  He uses biotene gel at night.  He reports shortness of breath when working in the sun.  He denies coughing and hemoptysis. He reports his energy level is low.  He is working full time.  BP 147/88 mmHg  Pulse 89  Temp(Src) 97.6 F (36.4 C) (Oral)  Resp 16  Ht '6\' 6"'$  (1.981 m)  Wt 306 lb 3.2 oz (138.891 kg)  BMI 35.39 kg/m2  SpO2 97%   Wt Readings from Last 3 Encounters:  10/12/14 306 lb 3.2 oz (138.891 kg)  08/20/14 309 lb (140.161 kg)  07/25/14 309 lb 12.8 oz (140.524 kg)

## 2014-10-12 NOTE — Progress Notes (Signed)
Radiation Oncology         [phone]) [phone] ________________________________  Name: Steven Ibarra MRN: [id number]  Date: 10/12/2014  DOB: 1955-12-06  Follow-Up Visit Note  Outpatient  CC: Penni Homans, MD  Melissa Montane, MD  Diagnosis and Prior Radiotherapy:    ICD-9-CM ICD-10-CM   1. Malignant neoplasm metastatic to both lungs 197.0 C78.01     C78.02    Diagnosis:   Basal Cell Carcinoma of the skin with squamous differentiation, Metastatic to the Submandibular Lymph Node, Right Neck -- now with bilateral lung metastases (Stage IV)  Indication for treatment:  aggressive local control    Radiation treatment dates:   05/23/2014, 05/25/2014, 05/28/2014, 05/30/2014, 06/01/2014, 08/19/11 Site/dose:    1) Right upper lung mass / 54 Gy in 3 fractions 2) Left lower lung mass and adjacent nodules / 50 Gy in 5 fractions  OTHER: Completed total dose of 66Gy in 33 fractions to the right facial nodes through the right neck and right trigeminal nerve to base of skull on 08-19-11  Narrative:  The patient returns today for routine follow-up. He denies pain. He reports trouble occasionally swallowing bread. He reports continuing to have a dry mouth. He uses biotene gel at night. He reports shortness of breath while working in the sun. He denies coughing and hemoptysis. He reports his energy level is low. He is working full time.  ALLERGIES:  has No Known Allergies.  Meds: Current Outpatient Prescriptions  Medication Sig Dispense Refill  . ACCU-CHEK AVIVA PLUS test strip USE AS DIRECTED 100 each 2  . ACCU-CHEK FASTCLIX LANCETS MISC USE AS DIRECTED 102 each 1  . ACCU-CHEK FASTCLIX LANCETS MISC USE AS DIRECTED 102 each 1  . Ascorbic Acid (VITAMIN C) 1000 MG tablet Take 1,000 mg by mouth daily.    . B-D ULTRAFINE III SHORT PEN 31G X 8 MM MISC USE TWICE DAILY AS DIRECTED WITH LANTUS DX: 250.0 90 each 2  . cetirizine (ZYRTEC) 10 MG tablet Take 10 mg by mouth daily as needed for allergies.     . fenofibrate  160 MG tablet Take 160 mg by mouth daily.   2  . fenofibrate 160 MG tablet TAKE 1 TABLET BY MOUTH EVERY DAY.Marland Kitchen MAX OF 30 DAYS ON INSURANCE 90 tablet 1  . ibuprofen (ADVIL,MOTRIN) 200 MG tablet Take 600 mg by mouth every 6 (six) hours as needed for pain.     Marland Kitchen insulin aspart (NOVOLOG) 100 UNIT/ML injection 20 units tid for high blood sugar. Test daily after dinner and see sliding scale for blood sugar  Please dispense the amount pt needs for 1 month 200 mL 5  . Insulin Glargine (LANTUS SOLOSTAR) 100 UNIT/ML Solostar Pen INJECT 100 UNITS IN MORNING AND 60 UNITS IN EVENING 60 pen 1  . Krill Oil 300 MG CAPS Take 1 capsule by mouth daily.    Marland Kitchen levothyroxine (SYNTHROID, LEVOTHROID) 75 MCG tablet TAKE 1 TABLET BY MOUTH EVERY DAY 30 tablet 1  . losartan (COZAAR) 50 MG tablet Take 1 tablet (50 mg total) by mouth daily. 30 tablet 5  . metFORMIN (GLUCOPHAGE) 500 MG tablet 2 tabs po bid and 1 tab po q noon 150 tablet 5  . Misc Natural Products (OSTEO BI-FLEX TRIPLE STRENGTH PO) Take 2 tablets by mouth daily.    . NON FORMULARY 1 each by Other route 4 (four) times daily. Accu-check multiclix lancets- use as directed    . Probiotic Product (PROBIOTIC DAILY PO) Take by mouth.    Marland Kitchen  ranitidine (ZANTAC) 300 MG tablet TAKE 1 TABLET (300 MG TOTAL) BY MOUTH AT BEDTIME. 30 tablet 3  . rosuvastatin (CRESTOR) 20 MG tablet Take 1 tablet (20 mg total) by mouth daily. 30 tablet 4  . Syringe, Disposable, 3 ML MISC DX: E11.9/E11.65  Use bid as directed 100 each 5  . aspirin 81 MG tablet Take 81 mg by mouth daily.    Marland Kitchen levothyroxine (SYNTHROID, LEVOTHROID) 75 MCG tablet TAKE 1 TABLET (75 MCG TOTAL) BY MOUTH DAILY. 30 tablet 5  . Pseudoephedrine-Ibuprofen 30-200 MG TABS Take 1 tablet by mouth 2 (two) times daily.     No current facility-administered medications for this encounter.   Physical Findings: The patient is in no acute distress. Patient is alert and oriented. General: Alert and oriented, in no acute  distress HEENT: Head is normocephalic. Extraocular movements are intact. Oropharynx is clear. Neck: Neck is supple, no palpable cervical or supraclavicular lymphadenopathy. Heart: Regular in rate and rhythm with no murmurs, rubs, or gallops. Chest: Clear to auscultation bilaterally, with no rhonchi, wheezes, or rales. Abdomen: Soft, nontender, nondistended, with no rigidity or guarding. Normoactive. Extremities: Some edema noted in his ankles. Lymphatics: see Neck Exam Skin: No concerning lesions. Musculoskeletal: symmetric strength and muscle tone throughout.  Psychiatric: Judgment and insight are intact. Affect is appropriate.   height is '6\' 6"'$  (1.981 m) and weight is 306 lb 3.2 oz (138.891 kg). His oral temperature is 97.6 F (36.4 C). His blood pressure is 147/88 and his pulse is 89. His respiration is 16 and oxygen saturation is 97%. .      Lab Findings: Lab Results  Component Value Date   WBC 3.8* 08/20/2014   HGB 13.9 08/20/2014   HCT 39.8 08/20/2014   MCV 86.9 08/20/2014   PLT 168.0 08/20/2014   Lab Results  Component Value Date   TSH 3.074 10/10/2014   CMP     Component Value Date/Time   NA 139 08/20/2014 0909   K 4.1 08/20/2014 0909   CL 108 08/20/2014 0909   CO2 25 08/20/2014 0909   GLUCOSE 156* 08/20/2014 0909   BUN 20.3 10/10/2014 0824   BUN 16 08/20/2014 0909   CREATININE 1.1 10/10/2014 0824   CREATININE 1.04 08/20/2014 0909   CREATININE 1.11 09/08/2013 0936   CALCIUM 8.9 08/20/2014 0909   PROT 6.1 08/20/2014 0909   ALBUMIN 3.9 08/20/2014 0909   AST 17 08/20/2014 0909   ALT 21 08/20/2014 0909   ALKPHOS 42 08/20/2014 0909   BILITOT 0.5 08/20/2014 0909   GFRNONAA >90 03/09/2014 0243   GFRAA >90 03/09/2014 0243     Radiographic Findings: Ct Chest W Contrast  10/10/2014   CLINICAL DATA:  Restaging lung cancer, head/neck cancer.  EXAM: CT CHEST WITH CONTRAST  TECHNIQUE: Multidetector CT imaging of the chest was performed during intravenous contrast  administration.  CONTRAST:  79m OMNIPAQUE IOHEXOL 300 MG/ML  SOLN  COMPARISON:  07/18/2014 and PET 02/12/2014, 05/04/2011.  FINDINGS: Mediastinum/Nodes: No pathologically enlarged mediastinal, hilar or axillary lymph nodes. Coronary artery calcification. Heart size normal. No pericardial effusion. Pre pericardiac lymph nodes are sub cm in short axis size.  Lungs/Pleura: 6 mm right upper lobe nodule (series 5, image 21) may have enlarged slightly in the interval, previously measuring 4 mm. New confluent opacity, volume loss, bronchiectasis and architectural distortion are seen predominantly in the right middle lobe. Previously seen right middle lobe nodule measures approximately 9 mm (series 5, image 27), previously 12 mm in long axis. Patchy consolidation  in the lingula and anterior aspect of the left lower lobe has worsened slightly in the interval. 9 mm left lower lobe nodule (series 5, image 43) has enlarged, previously measuring 5 mm. No pleural fluid. Airway is unremarkable.  Upper abdomen: Visualized portion of the liver appears slightly decreased in attenuation diffusely. Visualized portions of the liver, gallbladder and right adrenal gland are unremarkable. A nodule in the medial limb left adrenal gland measures 1.8 cm, stable from 05/04/2011. Low-attenuation lesion in the upper pole right kidney measures approximately 11 mm, stable, too small to characterize. Visualized portions of the kidneys and spleen are otherwise unremarkable. An 11 mm low-attenuation lesion in the pancreatic body (series 2, image 57) is unchanged from 05/04/2011, indicative of a benign lesion such as a pseudocyst. No ductal dilatation. Visualized portions of the stomach and bowel are grossly unremarkable. No upper abdominal adenopathy.  Musculoskeletal: No worrisome lytic or sclerotic lesions. Degenerative changes are seen in the spine.  IMPRESSION: 1. Interval changes of radiation therapy in the right middle lobe with slight  decrease in size of a dominant right middle lobe nodule. 2. Right upper and left lower lobe nodules show continued enlargement, worrisome for synchronous primary bronchogenic carcinomas. 3. Increased patchy consolidation in the lingula and anterior left lower lobe, possibly infectious or inflammatory in etiology. 4. Hepatic steatosis. 5. Stable left adrenal nodule, likely an adenoma, given stability from prior exams.   Electronically Signed   By: Lorin Picket M.D.   On: 10/10/2014 09:54    Impression/Plan:  Reviewed CT results with patient.  Excellent response to radiation in the two bilateral lung metastases. There are 2 small subcentimeter nodules in the bilateral lungs that continue to grow and are highly suspicious for other sites of metastatic disease. He is asymptomatic although fatigued and this could be of multiple etiologies. We spoke for a while about different approaches including SBRT to the 2 lung nodules, or referral to discuss systemic therapy again, or observation. We agreed that observation is prudent at this point. I will re-scan with CT scan in 3 months and will see him back at that time.   This document serves as a record of services personally performed by [name], MD. It was created on her behalf by Darcus Austin, a trained medical scribe. The creation of this record is based on the scribe's personal observations and the provider's statements to them. This document has been checked and approved by the attending provider.    _____________________________________   [name], MD

## 2014-10-15 ENCOUNTER — Telehealth: Payer: Self-pay | Admitting: *Deleted

## 2014-10-15 NOTE — Telephone Encounter (Signed)
CALLED PATIENT TO INFORM OF LAB, SCAN AND FU, LVM FOR A RETURN CALL

## 2014-10-16 ENCOUNTER — Telehealth: Payer: Self-pay | Admitting: *Deleted

## 2014-10-16 NOTE — Telephone Encounter (Signed)
CALLED  PATIENT TO INFORM OF APPTS. BEING ALTERED PER PATIENT REQUEST, LVM FOR A RETURN CALL

## 2014-10-17 ENCOUNTER — Other Ambulatory Visit: Payer: Self-pay | Admitting: Family Medicine

## 2014-10-18 ENCOUNTER — Telehealth: Payer: Self-pay | Admitting: Family Medicine

## 2014-10-18 DIAGNOSIS — E0869 Diabetes mellitus due to underlying condition with other specified complication: Secondary | ICD-10-CM

## 2014-10-18 MED ORDER — METFORMIN HCL 500 MG PO TABS: ORAL_TABLET | ORAL | Status: DC

## 2014-10-18 NOTE — Telephone Encounter (Signed)
Received call from Moore at the pharmacy needing clarification on the pt's Metformin.  Tammy stated that the pt have been taking 5 pills a day,but the prescription was changed to four pill a day.   Tammy was not should were the change came from.  She tried to call the pt to see how he was taking the Metformin but was unable to reach him.  Informed Tammy that I would try to reach the pt and also talk with Dr. Charlett Blake.  Called and spoke with the pt and asked him how he was taking the Metformin.  Pt stated that he was only taking 4 pills a day.  Asked him who changed the direction and he stated that he changed it.  He stated that taking that 1 noon pill upsets his stomach.  He stated that he mentioned it to Dr. Charlett Blake.  Asked when was his last HgbA1c and he stated that it was not done at his last blood work.  Please advise.//AB/CMA

## 2014-10-18 NOTE — Telephone Encounter (Signed)
Please have him stay with the 4 tabs daily as he is tolerating this and minimize his simple carb intake. We will adjust when we check the next hgba1c

## 2014-10-18 NOTE — Telephone Encounter (Signed)
Caller name: tammy at [facility]  Relation to pt: Call back number: [phone] Pharmacy:  Reason for call:   Needs clarification on metformin

## 2014-10-18 NOTE — Telephone Encounter (Signed)
Called and spoke with the pt and informed him of the note below.  Pt verbalized understanding and agreed.  Called and spoke with Tammy at the pharmacy and informed her for the note below.  Informed her that I will sent in a new rx for the 4  a day.  She agreed.  New rx for Metformin '500mg'$  Take 2 tablet by mouth 2 times a day # 120,4 sent to the pharmacy by e-script.//AB/CMA

## 2014-11-09 ENCOUNTER — Encounter: Payer: BLUE CROSS/BLUE SHIELD | Admitting: Family Medicine

## 2014-11-20 ENCOUNTER — Other Ambulatory Visit: Payer: Self-pay | Admitting: Family Medicine

## 2015-01-01 ENCOUNTER — Telehealth: Payer: Self-pay | Admitting: Family Medicine

## 2015-01-01 NOTE — Telephone Encounter (Signed)
So let him know to try Encouraged increased hydration and fiber in diet in future. For now If bowels not moving can use MOM 2 tbls po in 4 oz of warm prune juice by mouth. If no results then repeat in 4 hours with  Dulcolax suppository pr, may repeat again in 4 more hours as needed. Seek care if symptoms worsen. Consider daily Miralax and/or Dulcolax if symptoms persist. Can always consider an enema as well

## 2015-01-01 NOTE — Telephone Encounter (Signed)
Pt called having a hard time using the bathroom. He hasn't been able to go much in 4-5 days. He has been taking peri colace and miralax with no relief. Please call with recommendations. Ph# [phone].

## 2015-01-01 NOTE — Telephone Encounter (Signed)
Patient informed of PCP instructions.  He did understand and mailed him a copy of this note as well as called him back (per his instructions) left detailed message .

## 2015-01-05 ENCOUNTER — Other Ambulatory Visit: Payer: Self-pay | Admitting: Family Medicine

## 2015-01-06 NOTE — Telephone Encounter (Signed)
OK to refill his Lantus for  6 months. Just confirm 30 day with 5 rf or 90 day with 1 rf

## 2015-01-10 ENCOUNTER — Ambulatory Visit (HOSPITAL_BASED_OUTPATIENT_CLINIC_OR_DEPARTMENT_OTHER)
Admission: RE | Admit: 2015-01-10 | Discharge: 2015-01-10 | Disposition: A | Discharge status: Home / Self Care | Payer: BLUE CROSS/BLUE SHIELD | Source: Ambulatory Visit | Attending: Radiation Oncology | Admitting: Radiation Oncology

## 2015-01-10 ENCOUNTER — Encounter (HOSPITAL_COMMUNITY): Payer: Self-pay

## 2015-01-10 ENCOUNTER — Ambulatory Visit: Payer: BLUE CROSS/BLUE SHIELD

## 2015-01-10 ENCOUNTER — Ambulatory Visit (HOSPITAL_COMMUNITY): Payer: BLUE CROSS/BLUE SHIELD

## 2015-01-10 ENCOUNTER — Ambulatory Visit (HOSPITAL_COMMUNITY)
Admission: RE | Admit: 2015-01-10 | Discharge: 2015-01-10 | Disposition: A | Discharge status: Home / Self Care | Payer: BLUE CROSS/BLUE SHIELD | Source: Ambulatory Visit | Attending: Radiation Oncology | Admitting: Radiation Oncology

## 2015-01-10 DIAGNOSIS — C7802 Secondary malignant neoplasm of left lung: Secondary | ICD-10-CM

## 2015-01-10 DIAGNOSIS — I2510 Atherosclerotic heart disease of native coronary artery without angina pectoris: Secondary | ICD-10-CM | POA: Diagnosis not present

## 2015-01-10 DIAGNOSIS — C7801 Secondary malignant neoplasm of right lung: Secondary | ICD-10-CM | POA: Diagnosis present

## 2015-01-10 LAB — BUN AND CREATININE (CC13)
BUN: 18.9 mg/dL (ref 7.0–26.0)
Creatinine: 1.1 mg/dL (ref 0.7–1.3)
EGFR: 70 mL/min/{1.73_m2} — ABNORMAL LOW (ref >90)

## 2015-01-10 MED ORDER — IOHEXOL 300 MG/ML  SOLN
75.0000 mL | Freq: Once | INTRAMUSCULAR | Status: AC | PRN
  Administered 2015-01-10: 75 mL via INTRAVENOUS

## 2015-01-11 ENCOUNTER — Ambulatory Visit
Admission: RE | Admit: 2015-01-11 | Discharge: 2015-01-11 | Disposition: A | Discharge status: Home / Self Care | Payer: BLUE CROSS/BLUE SHIELD | Source: Ambulatory Visit | Attending: Radiation Oncology | Admitting: Radiation Oncology

## 2015-01-11 ENCOUNTER — Encounter: Payer: Self-pay | Admitting: Radiation Oncology

## 2015-01-11 ENCOUNTER — Telehealth: Payer: Self-pay | Admitting: Family Medicine

## 2015-01-11 ENCOUNTER — Other Ambulatory Visit: Payer: Self-pay | Admitting: Family Medicine

## 2015-01-11 VITALS — BP 145/88 | HR 90 | Temp 97.6°F | Ht 78.0 in | Wt 311.5 lb

## 2015-01-11 DIAGNOSIS — C7802 Secondary malignant neoplasm of left lung: Secondary | ICD-10-CM

## 2015-01-11 NOTE — Progress Notes (Signed)
Radiation Oncology         [phone] ________________________________  Name: Steven Ibarra MRN: [id number]  Date: 01/11/2015  DOB: 1956-02-18  Follow-Up Visit Note  Outpatient  CC: Penni Homans, MD  Melissa Montane, MD  Diagnosis and Prior Radiotherapy:    ICD-9-CM ICD-10-CM   1. 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 He reports no difficultly breathing at this time. He reports a good appetite. He reports some constipation recently which resolved with laxatives. The pt reports left back and right chest aches that have subsided.  Chest CT on 01/10/15 reveals that the right upper lobe nodule, which has not been treated, has grown from 0.6 to 0.9 cm. A left lower lobe nodule that has not been treated has grown from 0.9 to 1.6 cm over 3 months. There is a stable 9 mm left central lingular nodule. No new pulmonary nodules. There are radiation changes.  ALLERGIES:  has No Known Allergies.  Meds: Current Outpatient Prescriptions  Medication Sig Dispense Refill  . ACCU-CHEK AVIVA PLUS test strip USE AS DIRECTED 100 each 6  . ACCU-CHEK FASTCLIX LANCETS MISC USE AS DIRECTED 102 each 1  .  ACCU-CHEK FASTCLIX LANCETS MISC USE AS DIRECTED 102 each 1  . Ascorbic Acid (VITAMIN C) 1000 MG tablet Take 1,000 mg by mouth daily.    . B-D ULTRAFINE III SHORT PEN 31G X 8 MM MISC USE TWICE DAILY AS DIRECTED WITH LANTUS DX: 250.0 90 each 2  . cetirizine (ZYRTEC) 10 MG tablet Take 10 mg by mouth daily as needed for allergies.     . fenofibrate 160 MG tablet Take 160 mg by mouth daily.   2  . fenofibrate 160 MG tablet TAKE 1 TABLET BY MOUTH EVERY DAY.Marland Kitchen MAX OF 30 DAYS ON INSURANCE 90 tablet 1  . ibuprofen (ADVIL,MOTRIN) 200 MG tablet Take 600 mg by mouth every 6 (six) hours as needed for pain.     Marland Kitchen insulin aspart (NOVOLOG) 100 UNIT/ML injection 20 units tid for high blood sugar. Test daily after dinner and see sliding scale for blood sugar  Please dispense the amount pt needs for 1 month 200 mL 5  . Krill Oil 300 MG CAPS Take 1 capsule by mouth daily.    Marland Kitchen LANTUS SOLOSTAR 100 UNIT/ML Solostar Pen INJECT 100 UNITS IN MORNING AND 60 UNITS IN EVENING 60 mL 1  . levothyroxine (SYNTHROID, LEVOTHROID) 75 MCG tablet TAKE 1 TABLET (75 MCG TOTAL) BY MOUTH DAILY. 30 tablet 5  . losartan (COZAAR) 50 MG tablet Take 1 tablet (50 mg total) by mouth daily. 30 tablet 5  . metFORMIN (GLUCOPHAGE) 500 MG tablet Take 2 tablets  by mouth 2 times a day. 120 tablet 4  . Misc Natural Products (OSTEO BI-FLEX TRIPLE STRENGTH PO) Take 2 tablets by mouth daily.    . NON FORMULARY 1 each by Other route 4 (four) times daily. Accu-check multiclix lancets- use as directed    . Probiotic Product (PROBIOTIC DAILY PO) Take by mouth.    . ranitidine (ZANTAC) 300 MG tablet TAKE 1 TABLET (300 MG TOTAL) BY MOUTH AT BEDTIME. 30 tablet 3  . rosuvastatin (CRESTOR) 20 MG tablet Take 1 tablet (20 mg total) by mouth daily. 30 tablet 4  . Syringe, Disposable, 3 ML MISC DX: E11.9/E11.65  Use bid as directed 100 each 5  . aspirin 81 MG tablet Take 81 mg by mouth daily.    . Pseudoephedrine-Ibuprofen 30-200 MG TABS Take 1 tablet by mouth 2  (two) times daily.     No current facility-administered medications for this encounter.   Physical Findings: The patient is in no acute distress. Patient is alert and oriented.  height is '6\' 6"'$  (1.981 m) and weight is 311 lb 8 oz (141.295 kg). His temperature is 97.6 F (36.4 C). His blood pressure is 145/88 and his pulse is 90. .    Lungs are clear to auscultation bilaterally. Heart has regular rate and rhythm with no murmurs. No palpable cervical, supraclavicular, or axillary adenopathy. No tenderness to palpation to the left back and anterior right chest where he has been having some aches. Oropharynx is clear with no thrush.  Lab Findings: Lab Results  Component Value Date   WBC 3.8* 08/20/2014   HGB 13.9 08/20/2014   HCT 39.8 08/20/2014   MCV 86.9 08/20/2014   PLT 168.0 08/20/2014   Lab Results  Component Value Date   TSH 3.074 10/10/2014   CMP     Component Value Date/Time   NA 139 08/20/2014 0909   K 4.1 08/20/2014 0909   CL 108 08/20/2014 0909   CO2 25 08/20/2014 0909   GLUCOSE 156* 08/20/2014 0909   BUN 18.9 01/10/2015 0821   BUN 16 08/20/2014 0909   CREATININE 1.1 01/10/2015 0821   CREATININE 1.04 08/20/2014 0909   CREATININE 1.11 09/08/2013 0936   CALCIUM 8.9 08/20/2014 0909   PROT 6.1 08/20/2014 0909   ALBUMIN 3.9 08/20/2014 0909   AST 17 08/20/2014 0909   ALT 21 08/20/2014 0909   ALKPHOS 42 08/20/2014 0909   BILITOT 0.5 08/20/2014 0909   GFRNONAA >90 03/09/2014 0243   GFRAA >90 03/09/2014 0243     Radiographic Findings: Ct Chest W Contrast  01/10/2015  CLINICAL DATA:  Basal cell skin carcinoma with squamous differentiation with submandibular lymph node and bilateral lung metastases, presenting for restaging status post bilateral lung radiation therapy. EXAM: CT CHEST WITH CONTRAST TECHNIQUE: Multidetector CT imaging of the chest was performed during intravenous contrast administration. CONTRAST:  66m OMNIPAQUE IOHEXOL 300 MG/ML  SOLN COMPARISON:   10/10/2014 chest CT. FINDINGS: Mediastinum/Nodes: Normal heart size. No pericardial fluid/thickening. There is atherosclerosis of the thoracic aorta, the great vessels of the mediastinum and the coronary arteries, including calcified atherosclerotic plaque in the left anterior descending and left circumflex coronary arteries. Great vessels are normal in course and caliber. No central pulmonary emboli. Stable atrophic appearing right thyroid lobe. Normal esophagus. No axillary, mediastinal or hilar lymphadenopathy. Lungs/Pleura: No pneumothorax. No pleural effusion. There is a 1.0 cm anterior right upper lobe pulmonary nodule (series 5/image 18), increased from 0.6 cm. There is evolving sharply marginated consolidation with air bronchograms and  volume loss in the superior right middle lobe and inferior/central right upper lobe, in keeping with evolving radiation change. The previously described 0.9 cm right middle lobe pulmonary nodule is obscured by surrounding radiation change and no longer discretely visualized. There is evolving sharply marginated consolidation and volume loss in the lingula and anterior left upper lobe, in keeping with evolving radiation change. There is a 0.9 cm subpleural nodule in the central lingula (5/39), unchanged. There is a 1.6 cm peripheral left lower lobe pulmonary nodule (5/41), increased from 0.9 cm. No acute consolidative airspace disease or new significant pulmonary nodules. Upper abdomen: Stable 1.8 cm left adrenal adenoma (which was non hypermetabolic on the 00/71/2197 PET-CT). Musculoskeletal: No aggressive appearing focal osseous lesions. Moderate degenerative changes in the thoracic spine. IMPRESSION: 1. Interval growth of right upper lobe and left lower lobe pulmonary metastases. 2. Stable 0.9 cm central lingular nodule. 3. Involving radiation change in the right middle/upper lobes and lingula. No new pulmonary nodules. 4. Atherosclerosis, including two-vessel coronary  artery disease. Please note that although the presence of coronary artery calcium documents the presence of coronary artery disease, the severity of this disease and any potential stenosis cannot be assessed on this non-gated CT examination. Assessment for potential risk factor modification, dietary therapy or pharmacologic therapy may be warranted, if clinically indicated. Electronically Signed   By: Ilona Sorrel M.D.   On: 01/10/2015 10:48    Impression/Plan:  Reviewed CT results with patient.  Excellent response to radiation in the two bilateral lung metastases. The 2 small nodules in the bilateral lungs continue to grow and are highly suspicious for other sites of metastatic disease. He is asymptomatic, but I think that it may be worth discussing further treatment in the form of SBRT, surgical resection, or a combination of the two to address the two growing nodules. Would like to rule out distant metastases throughout the rest of the body before subjecting him to further treatment and would like to establish oligometastatic vs diffuse metastatic disease.  If his insurance would allow, a PET restaging scan would be prudent. After the PET scan, I would present his case to the lung tumor board. I will call him with the results and consensus from tumor board. He knows to call if he doesn't hear from me or has any questions. He is pleased with this plan.   This document serves as a record of services personally performed by [name], MD. It was created on her behalf by Darcus Austin, a trained medical scribe. The creation of this record is based on the scribe's personal observations and the provider's statements to them. This document has been checked and approved by the attending provider.    _____________________________________   [name], MD

## 2015-01-11 NOTE — Progress Notes (Signed)
Mr. Marchello Rothgeb is here for follow- up to radiation completed 06/01/2014 to his right and left lung. 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 He reports no difficultly breathing at this time. He reports a good appetite. He does state he had some constipation recently which resolved with laxatives. His skin is intact with no discoloration.  BP 145/88 mmHg  Pulse 90  Temp(Src) 97.6 F (36.4 C)  Ht '6\' 6"'$  (1.981 m)  Wt 311 lb 8 oz (141.295 kg)  BMI 36.00 kg/m2   Wt Readings from Last 3 Encounters:  01/11/15 311 lb 8 oz (141.295 kg)  10/12/14 306 lb 3.2 oz (138.891 kg)  08/20/14 309 lb (140.161 kg)

## 2015-01-11 NOTE — Telephone Encounter (Signed)
Refills sent by Dr. Charlett Blake on 12/30/2014 #102 and [facility] [place].

## 2015-01-11 NOTE — Telephone Encounter (Signed)
Relation to KA:JGOT Call back number: [phone] [facility]acy[id number] [phone]Phone) [phone](Fax)         Reason for call:  Patient requesting a refill ACCU-CHEK FASTCLIX LANCETS MMarshallpatient states she is completely out.

## 2015-01-14 ENCOUNTER — Telehealth: Payer: Self-pay | Admitting: *Deleted

## 2015-01-14 NOTE — Telephone Encounter (Signed)
CALLED PATIENT TO INFORM OF PET SCAN FOR 01-22-15- ARRIVAL TIME - 6:30 AM @ [facility], SPOKE WITH PATIENT AND HE IS AWARE OF THIS TEST

## 2015-01-22 ENCOUNTER — Ambulatory Visit (HOSPITAL_COMMUNITY)
Admission: RE | Admit: 2015-01-22 | Discharge: 2015-01-22 | Disposition: A | Discharge status: Home / Self Care | Payer: BLUE CROSS/BLUE SHIELD | Source: Ambulatory Visit | Attending: Radiation Oncology | Admitting: Radiation Oncology

## 2015-01-22 DIAGNOSIS — C770 Secondary and unspecified malignant neoplasm of lymph nodes of head, face and neck: Secondary | ICD-10-CM | POA: Insufficient documentation

## 2015-01-22 DIAGNOSIS — C7802 Secondary malignant neoplasm of left lung: Secondary | ICD-10-CM

## 2015-01-22 DIAGNOSIS — C4492 Squamous cell carcinoma of skin, unspecified: Secondary | ICD-10-CM | POA: Insufficient documentation

## 2015-01-22 DIAGNOSIS — C7801 Secondary malignant neoplasm of right lung: Secondary | ICD-10-CM | POA: Insufficient documentation

## 2015-01-22 MED ORDER — FLUDEOXYGLUCOSE F - 18 (FDG) INJECTION
15.3600 | Freq: Once | INTRAVENOUS | Status: DC | PRN
  Administered 2015-01-22: 15.36 via INTRAVENOUS
  Filled 2015-01-22: qty 15.36

## 2015-01-25 LAB — GLUCOSE, CAPILLARY: GLUCOSE-CAPILLARY: 158 mg/dL — AB (ref 65–99)

## 2015-02-01 ENCOUNTER — Ambulatory Visit
Admission: RE | Admit: 2015-02-01 | Discharge: 2015-02-01 | Disposition: A | Discharge status: Home / Self Care | Payer: BLUE CROSS/BLUE SHIELD | Source: Ambulatory Visit | Attending: Radiation Oncology | Admitting: Radiation Oncology

## 2015-02-01 ENCOUNTER — Telehealth: Payer: Self-pay | Admitting: *Deleted

## 2015-02-01 ENCOUNTER — Other Ambulatory Visit: Payer: Self-pay | Admitting: Radiation Oncology

## 2015-02-01 DIAGNOSIS — C801 Malignant (primary) neoplasm, unspecified: Secondary | ICD-10-CM | POA: Insufficient documentation

## 2015-02-01 DIAGNOSIS — C7802 Secondary malignant neoplasm of left lung: Secondary | ICD-10-CM

## 2015-02-01 DIAGNOSIS — Z510 Encounter for antineoplastic radiation therapy: Secondary | ICD-10-CM | POA: Insufficient documentation

## 2015-02-01 NOTE — Telephone Encounter (Signed)
xxxxx 

## 2015-02-01 NOTE — Telephone Encounter (Signed)
Called patient to inform of PFT to be done @ [facility] on 02-06-15- arrival time - 8:30 am , spoke with patient and he is aware of this test.

## 2015-02-01 NOTE — Progress Notes (Signed)
  Radiation Oncology         [phone] ________________________________  Name: Steven Ibarra MRN: [id number]  Date: 02/01/2015  DOB: 1955-11-07  SIMULATION AND TREATMENT PLANNING NOTE, 4D RESPIRATORY MOTION MANAGEMENT SIMULATION, Special Treatment Procedure Note  Outpatient  DIAGNOSIS:     ICD-9-CM ICD-10-CM   1. Malignant neoplasm metastatic to left lung ([place]) 197.0 C78.02     NARRATIVE:  The patient was brought to the [facility].  Identity was confirmed.  All relevant records and images related to the planned course of therapy were reviewed.  The patient freely provided informed written consent to proceed with treatment after reviewing the details related to the planned course of therapy. The consent form was witnessed and verified by the simulation staff.    Then, the patient was set-up in a stable reproducible  supine position for radiation therapy  With accuform for head, vaclock for body.   RESPIRATORY MOTION MANAGEMENT SIMULATION  NARRATIVE:  In order to account for effect of respiratory motion on target structures and other organs in the planning and delivery of radiotherapy, this patient underwent respiratory motion management simulation.  To accomplish this, when the patient was brought to the CT simulation planning suite, 4D respiratory motion management CT images were obtained with DIBH.  The CT images were loaded into the planning software.  Then, using a variety of tools including Cine, MIP, and standard views, the target volume and planning target volumes (PTV) were delineated.  Avoidance structures were contoured.  Treatment planning then occurred.  Dose volume histograms were ordered for each of the requested structures.     TREATMENT PLANNING NOTE: Treatment planning then occurred.  The radiation prescription was entered and confirmed.    A total of 2 medically necessary complex treatment devices were fabricated and supervised by me, in the form of  accuform for head, vaclock for body.Marland Kitchen MORE complex treatment devices Will BE ADDED IN DOSIMETRY.  I have requested : 3D Simulation  I have requested a DVH of the following structures: heart, lungs, cord, TV's, chest wall, esophagus.  I have ordered: PFTs  The patient will receive 50 Gy in 5 fractions to both RUL AND LLL lung metastases.  Special Treatment Procedure Note: The patient received prior radiotherapy close to his current fields. There could be some overlap of radiation dose.  Prior regional radiotherapy increases the risk of side effects from treatment. I have considered this in the treatment planning process and have aimed to minimize tissue overlap.  This increases the complexity of this patient's treatment and therefore this constitutes a special treatment procedure.  -----------------------------------  [name], MD

## 2015-02-02 ENCOUNTER — Other Ambulatory Visit: Payer: Self-pay | Admitting: Family Medicine

## 2015-02-04 ENCOUNTER — Encounter (HOSPITAL_COMMUNITY): Payer: BLUE CROSS/BLUE SHIELD

## 2015-02-06 ENCOUNTER — Ambulatory Visit (HOSPITAL_COMMUNITY)
Admission: RE | Admit: 2015-02-06 | Discharge: 2015-02-06 | Disposition: A | Discharge status: Home / Self Care | Payer: BLUE CROSS/BLUE SHIELD | Source: Ambulatory Visit | Attending: Radiation Oncology | Admitting: Radiation Oncology

## 2015-02-06 DIAGNOSIS — R0609 Other forms of dyspnea: Secondary | ICD-10-CM | POA: Insufficient documentation

## 2015-02-06 DIAGNOSIS — F17210 Nicotine dependence, cigarettes, uncomplicated: Secondary | ICD-10-CM | POA: Insufficient documentation

## 2015-02-06 DIAGNOSIS — C7802 Secondary malignant neoplasm of left lung: Secondary | ICD-10-CM | POA: Diagnosis present

## 2015-02-06 LAB — PULMONARY FUNCTION TEST
DL/VA % PRED: 79 %
DL/VA: 3.96 ml/min/mmHg/L
DLCO unc % pred: 64 %
DLCO unc: 27.73 ml/min/mmHg
FEF 25-75 POST: 3.39 L/s
FEF 25-75 Pre: 3.24 L/sec
FEF2575-%CHANGE-POST: 4 %
FEF2575-%PRED-POST: 88 %
FEF2575-%Pred-Pre: 84 %
FEV1-%Change-Post: 2 %
FEV1-%Pred-Post: 81 %
FEV1-%Pred-Pre: 79 %
FEV1-POST: 3.87 L
FEV1-Pre: 3.76 L
FEV1FVC-%CHANGE-POST: 2 %
FEV1FVC-%Pred-Pre: 97 %
FEV6-%Change-Post: 0 %
FEV6-%Pred-Post: 83 %
FEV6-%Pred-Pre: 83 %
FEV6-PRE: 5 L
FEV6-Post: 4.99 L
FEV6FVC-%Change-Post: 0 %
FEV6FVC-%Pred-Post: 102 %
FEV6FVC-%Pred-Pre: 102 %
FVC-%CHANGE-POST: 0 %
FVC-%PRED-POST: 81 %
FVC-%PRED-PRE: 80 %
FVC-PRE: 5.06 L
FVC-Post: 5.06 L
POST FEV1/FVC RATIO: 76 %
PRE FEV6/FVC RATIO: 99 %
Post FEV6/FVC ratio: 99 %
Pre FEV1/FVC ratio: 74 %
RV % pred: 95 %
RV: 2.52 L
TLC % pred: 92 %
TLC: 7.91 L

## 2015-02-06 MED ORDER — ALBUTEROL SULFATE (2.5 MG/3ML) 0.083% IN NEBU
2.5000 mg | INHALATION_SOLUTION | Freq: Once | RESPIRATORY_TRACT | Status: AC
  Administered 2015-02-06: 2.5 mg via RESPIRATORY_TRACT

## 2015-02-11 ENCOUNTER — Ambulatory Visit
Admission: RE | Admit: 2015-02-11 | Discharge: 2015-02-11 | Disposition: A | Discharge status: Home / Self Care | Payer: BLUE CROSS/BLUE SHIELD | Source: Ambulatory Visit | Attending: Radiation Oncology | Admitting: Radiation Oncology

## 2015-02-11 ENCOUNTER — Encounter: Payer: Self-pay | Admitting: Radiation Oncology

## 2015-02-11 VITALS — BP 138/84 | HR 94 | Temp 98.0°F | Resp 18 | Ht 78.0 in | Wt 310.2 lb

## 2015-02-11 DIAGNOSIS — C7802 Secondary malignant neoplasm of left lung: Secondary | ICD-10-CM

## 2015-02-11 DIAGNOSIS — Z510 Encounter for antineoplastic radiation therapy: Secondary | ICD-10-CM | POA: Diagnosis not present

## 2015-02-11 NOTE — Progress Notes (Signed)
Steven Ibarra has completed 1 fraction to his chest.  He denies pain, shortness of breath and cough.  BP 138/84 mmHg  Pulse 94  Temp(Src) 98 F (36.7 C) (Oral)  Resp 18  Ht '6\' 6"'$  (1.981 m)  Wt 310 lb 3.2 oz (140.706 kg)  BMI 35.85 kg/m2

## 2015-02-11 NOTE — Progress Notes (Signed)
   Weekly Management Note:  Outpatient    ICD-9-CM ICD-10-CM   1. Malignant neoplasm metastatic to left lung ([facility]) 197.0 C78.02     Current Dose:  10 Gy  Projected Dose: 50 Gy   Narrative:  The patient presents for routine under treatment assessment.  CBCT/MVCT images/Port film x-rays were reviewed.  The chart was checked. Doing well  Physical Findings:  height is '6\' 6"'$  (1.981 m) and weight is 310 lb 3.2 oz (140.706 kg). His oral temperature is 98 F (36.7 C). His blood pressure is 138/84 and his pulse is 94. His respiration is 18.   Wt Readings from Last 3 Encounters:  02/11/15 310 lb 3.2 oz (140.706 kg)  01/11/15 311 lb 8 oz (141.295 kg)  10/12/14 306 lb 3.2 oz (138.891 kg)   NAD ambulatory   Impression:  The patient is tolerating radiotherapy.  Plan:  Continue radiotherapy as planned.    ________________________________   [name], M.D.

## 2015-02-12 ENCOUNTER — Ambulatory Visit: Payer: BLUE CROSS/BLUE SHIELD

## 2015-02-13 ENCOUNTER — Ambulatory Visit: Payer: BLUE CROSS/BLUE SHIELD

## 2015-02-13 ENCOUNTER — Ambulatory Visit
Admission: RE | Admit: 2015-02-13 | Discharge: 2015-02-13 | Disposition: A | Discharge status: Home / Self Care | Payer: BLUE CROSS/BLUE SHIELD | Source: Ambulatory Visit | Attending: Radiation Oncology | Admitting: Radiation Oncology

## 2015-02-13 ENCOUNTER — Ambulatory Visit (INDEPENDENT_AMBULATORY_CARE_PROVIDER_SITE_OTHER): Payer: BLUE CROSS/BLUE SHIELD

## 2015-02-13 DIAGNOSIS — Z510 Encounter for antineoplastic radiation therapy: Secondary | ICD-10-CM | POA: Diagnosis not present

## 2015-02-13 DIAGNOSIS — Z23 Encounter for immunization: Secondary | ICD-10-CM | POA: Diagnosis not present

## 2015-02-14 ENCOUNTER — Ambulatory Visit: Payer: BLUE CROSS/BLUE SHIELD

## 2015-02-15 ENCOUNTER — Ambulatory Visit
Admission: RE | Admit: 2015-02-15 | Discharge: 2015-02-15 | Disposition: A | Discharge status: Home / Self Care | Payer: BLUE CROSS/BLUE SHIELD | Source: Ambulatory Visit | Attending: Radiation Oncology | Admitting: Radiation Oncology

## 2015-02-15 DIAGNOSIS — Z510 Encounter for antineoplastic radiation therapy: Secondary | ICD-10-CM | POA: Diagnosis not present

## 2015-02-18 ENCOUNTER — Ambulatory Visit
Admission: RE | Admit: 2015-02-18 | Discharge: 2015-02-18 | Disposition: A | Discharge status: Home / Self Care | Payer: BLUE CROSS/BLUE SHIELD | Source: Ambulatory Visit | Attending: Radiation Oncology | Admitting: Radiation Oncology

## 2015-02-18 ENCOUNTER — Ambulatory Visit: Payer: BLUE CROSS/BLUE SHIELD

## 2015-02-18 ENCOUNTER — Encounter: Payer: Self-pay | Admitting: Radiation Oncology

## 2015-02-18 VITALS — BP 159/86 | HR 89 | Temp 97.5°F | Ht 78.0 in | Wt 314.8 lb

## 2015-02-18 DIAGNOSIS — C7802 Secondary malignant neoplasm of left lung: Secondary | ICD-10-CM

## 2015-02-18 DIAGNOSIS — Z510 Encounter for antineoplastic radiation therapy: Secondary | ICD-10-CM | POA: Diagnosis not present

## 2015-02-18 NOTE — Progress Notes (Signed)
   Weekly Management Note:  Outpatient    ICD-9-CM ICD-10-CM   1. Malignant neoplasm metastatic to left lung ([facility]) 197.0 C78.02     Current Dose:  40 Gy  Projected Dose: 50 Gy   Narrative:  The patient presents for routine under treatment assessment.  CBCT/MVCT images/Port film x-rays were reviewed.  The chart was checked. Doing well, transient right chest wall pain in the past few days, gone now.  Physical Findings:  height is '6\' 6"'$  (1.981 m) and weight is 314 lb 12.8 oz (142.792 kg). His temperature is 97.5 F (36.4 C). His blood pressure is 159/86 and his pulse is 89.   Wt Readings from Last 3 Encounters:  02/18/15 314 lb 12.8 oz (142.792 kg)  02/11/15 310 lb 3.2 oz (140.706 kg)  01/11/15 311 lb 8 oz (141.295 kg)   NAD ambulatory and breathing well.  Impression:  The patient is tolerating radiotherapy.  Plan:  Continue radiotherapy as planned.  F/u in 76mo________________________________   SEppie Gibson M.D.

## 2015-02-18 NOTE — Progress Notes (Signed)
Mr. Mazer is here for his 4th fraction of SBRT. He admits to some fatigue towards the end of the day, after he has been working. He denies any pain at this time, but does admit to some pain yesterday over his right lateral chest. He states he will take ibuprofen at times for the pain. His skin is normal color without any issues at this time.   BP 159/86 mmHg  Pulse 89  Temp(Src) 97.5 F (36.4 C)  Ht '6\' 6"'$  (1.981 m)  Wt 314 lb 12.8 oz (142.792 kg)  BMI 36.39 kg/m2   Wt Readings from Last 3 Encounters:  02/18/15 314 lb 12.8 oz (142.792 kg)  02/11/15 310 lb 3.2 oz (140.706 kg)  01/11/15 311 lb 8 oz (141.295 kg)

## 2015-02-19 ENCOUNTER — Ambulatory Visit: Payer: BLUE CROSS/BLUE SHIELD

## 2015-02-19 ENCOUNTER — Encounter: Payer: BLUE CROSS/BLUE SHIELD | Admitting: Family Medicine

## 2015-02-20 ENCOUNTER — Ambulatory Visit
Admission: RE | Admit: 2015-02-20 | Discharge: 2015-02-20 | Disposition: A | Discharge status: Home / Self Care | Payer: BLUE CROSS/BLUE SHIELD | Source: Ambulatory Visit | Attending: Radiation Oncology | Admitting: Radiation Oncology

## 2015-02-20 ENCOUNTER — Ambulatory Visit: Payer: BLUE CROSS/BLUE SHIELD

## 2015-02-20 ENCOUNTER — Other Ambulatory Visit: Payer: Self-pay | Admitting: Family Medicine

## 2015-02-20 ENCOUNTER — Encounter: Payer: Self-pay | Admitting: Radiation Oncology

## 2015-02-20 DIAGNOSIS — Z510 Encounter for antineoplastic radiation therapy: Secondary | ICD-10-CM | POA: Diagnosis not present

## 2015-02-21 ENCOUNTER — Ambulatory Visit: Payer: BLUE CROSS/BLUE SHIELD

## 2015-02-25 ENCOUNTER — Ambulatory Visit: Payer: BLUE CROSS/BLUE SHIELD

## 2015-02-26 ENCOUNTER — Ambulatory Visit: Payer: BLUE CROSS/BLUE SHIELD

## 2015-02-26 NOTE — Progress Notes (Signed)
  Radiation Oncology         [phone] ________________________________  Name: Steven Ibarra MRN: [id number]  Date: 02/20/2015  DOB: 1955-09-13  End of Treatment Note  DIAGNOSIS:    ICD-9-CM ICD-10-CM   1. 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      Plan: The patient has completed radiation treatment. The patient will return to radiation oncology clinic for routine followup in one month. I advised them to call or return sooner if they have any questions or concerns related to their recovery or treatment.  -----------------------------------  [name], MD

## 2015-02-27 ENCOUNTER — Telehealth: Payer: Self-pay | Admitting: Family Medicine

## 2015-02-27 ENCOUNTER — Other Ambulatory Visit: Payer: Self-pay | Admitting: Emergency Medicine

## 2015-02-27 DIAGNOSIS — E119 Type 2 diabetes mellitus without complications: Secondary | ICD-10-CM

## 2015-02-27 DIAGNOSIS — E785 Hyperlipidemia, unspecified: Secondary | ICD-10-CM

## 2015-02-27 DIAGNOSIS — E1169 Type 2 diabetes mellitus with other specified complication: Secondary | ICD-10-CM

## 2015-02-27 DIAGNOSIS — I10 Essential (primary) hypertension: Secondary | ICD-10-CM

## 2015-02-27 DIAGNOSIS — E669 Obesity, unspecified: Principal | ICD-10-CM

## 2015-02-27 NOTE — Telephone Encounter (Signed)
Done . Orders in.

## 2015-02-27 NOTE — Telephone Encounter (Signed)
CMP, CBC, TSH, Lipid, hgba1c, urine for microalb. For HTN, hyperlipidemia, and DM type 2 please order for patient on 12/1 at his request

## 2015-02-27 NOTE — Telephone Encounter (Signed)
Pt called to schedule previsit labs for 02/28/15. His CPE is 03/04/15. Please enter orders as needed.

## 2015-02-28 ENCOUNTER — Other Ambulatory Visit: Payer: Self-pay | Admitting: *Deleted

## 2015-02-28 ENCOUNTER — Other Ambulatory Visit (INDEPENDENT_AMBULATORY_CARE_PROVIDER_SITE_OTHER): Payer: BLUE CROSS/BLUE SHIELD

## 2015-02-28 DIAGNOSIS — E785 Hyperlipidemia, unspecified: Secondary | ICD-10-CM

## 2015-02-28 DIAGNOSIS — E119 Type 2 diabetes mellitus without complications: Secondary | ICD-10-CM | POA: Diagnosis not present

## 2015-02-28 DIAGNOSIS — I10 Essential (primary) hypertension: Secondary | ICD-10-CM | POA: Diagnosis not present

## 2015-02-28 LAB — MICROALBUMIN / CREATININE URINE RATIO
Creatinine,U: 205.7 mg/dL
MICROALB UR: 1 mg/dL (ref 0.0–1.9)
MICROALB/CREAT RATIO: 0.5 mg/g (ref 0.0–30.0)

## 2015-02-28 LAB — CBC WITH DIFFERENTIAL/PLATELET
BASOS PCT: 0.5 % (ref 0.0–3.0)
Basophils Absolute: 0 10*3/uL (ref 0.0–0.1)
EOS PCT: 4.3 % (ref 0.0–5.0)
Eosinophils Absolute: 0.2 10*3/uL (ref 0.0–0.7)
HCT: 44.8 % (ref 39.0–52.0)
Hemoglobin: 15.2 g/dL (ref 13.0–17.0)
LYMPHS ABS: 0.5 10*3/uL — AB (ref 0.7–4.0)
Lymphocytes Relative: 10.3 % — ABNORMAL LOW (ref 12.0–46.0)
MCHC: 34 g/dL (ref 30.0–36.0)
MCV: 88.1 fl (ref 78.0–100.0)
MONO ABS: 0.5 10*3/uL (ref 0.1–1.0)
MONOS PCT: 9.5 % (ref 3.0–12.0)
NEUTROS ABS: 3.9 10*3/uL (ref 1.4–7.7)
NEUTROS PCT: 75.4 % (ref 43.0–77.0)
PLATELETS: 164 10*3/uL (ref 150.0–400.0)
RBC: 5.09 Mil/uL (ref 4.22–5.81)
RDW: 14 % (ref 11.5–15.5)
WBC: 5.1 10*3/uL (ref 4.0–10.5)

## 2015-02-28 LAB — LDL CHOLESTEROL, DIRECT: LDL DIRECT: 46 mg/dL

## 2015-02-28 LAB — LIPID PANEL
Cholesterol: 114 mg/dL (ref 0–200)
HDL: 21.7 mg/dL — ABNORMAL LOW (ref >39.00)
NONHDL: 92.29
Total CHOL/HDL Ratio: 5
Triglycerides: 347 mg/dL — ABNORMAL HIGH (ref 0.0–149.0)
VLDL: 69.4 mg/dL — ABNORMAL HIGH (ref 0.0–40.0)

## 2015-02-28 LAB — HEMOGLOBIN A1C: HEMOGLOBIN A1C: 8 % — AB (ref 4.6–6.5)

## 2015-02-28 LAB — TSH: TSH: 5.08 u[IU]/mL — AB (ref 0.35–4.50)

## 2015-02-28 LAB — COMPREHENSIVE METABOLIC PANEL
ALK PHOS: 43 U/L (ref 39–117)
ALT: 20 U/L (ref 0–53)
AST: 15 U/L (ref 0–37)
Albumin: 4.1 g/dL (ref 3.5–5.2)
BUN: 19 mg/dL (ref 6–23)
CHLORIDE: 103 meq/L (ref 96–112)
CO2: 29 meq/L (ref 19–32)
Calcium: 9.3 mg/dL (ref 8.4–10.5)
Creatinine, Ser: 1.2 mg/dL (ref 0.40–1.50)
GFR: 66.01 mL/min (ref >60.00)
GLUCOSE: 200 mg/dL — AB (ref 70–99)
POTASSIUM: 4.3 meq/L (ref 3.5–5.1)
SODIUM: 139 meq/L (ref 135–145)
Total Bilirubin: 0.6 mg/dL (ref 0.2–1.2)
Total Protein: 6.8 g/dL (ref 6.0–8.3)

## 2015-03-01 ENCOUNTER — Telehealth: Payer: Self-pay | Admitting: Behavioral Health

## 2015-03-01 NOTE — Telephone Encounter (Signed)
Unable to reach patient at time of Pre-Visit Call.  Left message for patient to return call when available.    

## 2015-03-04 ENCOUNTER — Telehealth: Payer: Self-pay | Admitting: Family Medicine

## 2015-03-04 ENCOUNTER — Encounter: Payer: Self-pay | Admitting: Family Medicine

## 2015-03-04 ENCOUNTER — Other Ambulatory Visit: Payer: Self-pay | Admitting: Family Medicine

## 2015-03-04 ENCOUNTER — Ambulatory Visit (INDEPENDENT_AMBULATORY_CARE_PROVIDER_SITE_OTHER): Payer: BLUE CROSS/BLUE SHIELD | Admitting: Family Medicine

## 2015-03-04 VITALS — BP 122/84 | HR 97 | Temp 97.8°F | Ht 78.0 in | Wt 310.4 lb

## 2015-03-04 DIAGNOSIS — H6091 Unspecified otitis externa, right ear: Secondary | ICD-10-CM | POA: Diagnosis not present

## 2015-03-04 DIAGNOSIS — E669 Obesity, unspecified: Secondary | ICD-10-CM

## 2015-03-04 DIAGNOSIS — K219 Gastro-esophageal reflux disease without esophagitis: Secondary | ICD-10-CM

## 2015-03-04 DIAGNOSIS — Z23 Encounter for immunization: Secondary | ICD-10-CM | POA: Diagnosis not present

## 2015-03-04 DIAGNOSIS — I10 Essential (primary) hypertension: Secondary | ICD-10-CM | POA: Diagnosis not present

## 2015-03-04 DIAGNOSIS — E663 Overweight: Secondary | ICD-10-CM

## 2015-03-04 DIAGNOSIS — Z1159 Encounter for screening for other viral diseases: Secondary | ICD-10-CM

## 2015-03-04 DIAGNOSIS — G4733 Obstructive sleep apnea (adult) (pediatric): Secondary | ICD-10-CM

## 2015-03-04 DIAGNOSIS — C4441 Basal cell carcinoma of skin of scalp and neck: Secondary | ICD-10-CM

## 2015-03-04 DIAGNOSIS — E1169 Type 2 diabetes mellitus with other specified complication: Secondary | ICD-10-CM

## 2015-03-04 DIAGNOSIS — Z0000 Encounter for general adult medical examination without abnormal findings: Secondary | ICD-10-CM

## 2015-03-04 DIAGNOSIS — E039 Hypothyroidism, unspecified: Secondary | ICD-10-CM

## 2015-03-04 DIAGNOSIS — IMO0002 Reserved for concepts with insufficient information to code with codable children: Secondary | ICD-10-CM

## 2015-03-04 DIAGNOSIS — E1165 Type 2 diabetes mellitus with hyperglycemia: Secondary | ICD-10-CM

## 2015-03-04 DIAGNOSIS — Z794 Long term (current) use of insulin: Principal | ICD-10-CM

## 2015-03-04 DIAGNOSIS — E118 Type 2 diabetes mellitus with unspecified complications: Principal | ICD-10-CM

## 2015-03-04 DIAGNOSIS — E782 Mixed hyperlipidemia: Secondary | ICD-10-CM

## 2015-03-04 MED ORDER — GLUCOSE BLOOD VI STRP: ORAL_STRIP | Status: DC

## 2015-03-04 MED ORDER — ROSUVASTATIN CALCIUM 40 MG PO TABS: 40.0000 mg | ORAL_TABLET | Freq: Every day | ORAL | Status: DC

## 2015-03-04 NOTE — Progress Notes (Signed)
Pre visit review using our clinic review tool, if applicable. No additional management support is needed unless otherwise documented below in the visit note. 

## 2015-03-04 NOTE — Patient Instructions (Signed)
Steven Ibarra for stress   Preventive Care for Adults, Male A healthy lifestyle and preventive care can promote health and wellness. Preventive health guidelines for men include the following key practices:  A routine yearly physical is a good way to check with your health care provider about your health and preventative screening. It is a chance to share any concerns and updates on your health and to receive a thorough exam.  Visit your dentist for a routine exam and preventative care every 6 months. Brush your teeth twice a day and floss once a day. Good oral hygiene prevents tooth decay and gum disease.  The frequency of eye exams is based on your age, health, family medical history, use of contact lenses, and other factors. Follow your health care provider's recommendations for frequency of eye exams.  Eat a healthy diet. Foods such as vegetables, fruits, whole grains, low-fat dairy products, and lean protein foods contain the nutrients you need without too many calories. Decrease your intake of foods high in solid fats, added sugars, and salt. Eat the right amount of calories for you.Get information about a proper diet from your health care provider, if necessary.  Regular physical exercise is one of the most important things you can do for your health. Most adults should get at least 150 minutes of moderate-intensity exercise (any activity that increases your heart rate and causes you to sweat) each week. In addition, most adults need muscle-strengthening exercises on 2 or more days a week.  Maintain a healthy weight. The body mass index (BMI) is a screening tool to identify possible weight problems. It provides an estimate of body fat based on height and weight. Your health care provider can find your BMI and can help you achieve or maintain a healthy weight.For adults 20 years and older:  A BMI below 18.5 is considered underweight.  A BMI of 18.5 to 24.9 is  normal.  A BMI of 25 to 29.9 is considered overweight.  A BMI of 30 and above is considered obese.  Maintain normal blood lipids and cholesterol levels by exercising and minimizing your intake of saturated fat. Eat a balanced diet with plenty of fruit and vegetables. Blood tests for lipids and cholesterol should begin at age 27 and be repeated every 5 years. If your lipid or cholesterol levels are high, you are over 50, or you are at high risk for heart disease, you may need your cholesterol levels checked more frequently.Ongoing high lipid and cholesterol levels should be treated with medicines if diet and exercise are not working.  If you smoke, find out from your health care provider how to quit. If you do not use tobacco, do not start.  Lung cancer screening is recommended for adults aged 18-80 years who are at high risk for developing lung cancer because of a history of smoking. A yearly low-dose CT scan of the lungs is recommended for people who have at least a 30-pack-year history of smoking and are a current smoker or have quit within the past 15 years. A pack year of smoking is smoking an average of 1 pack of cigarettes a day for 1 year (for example: 1 pack a day for 30 years or 2 packs a day for 15 years). Yearly screening should continue until the smoker has stopped smoking for at least 15 years. Yearly screening should be stopped for people who develop a health problem that would prevent them from having lung cancer treatment.  If you  choose to drink alcohol, do not have more than 2 drinks per day. One drink is considered to be 12 ounces (355 mL) of beer, 5 ounces (148 mL) of wine, or 1.5 ounces (44 mL) of liquor.  Avoid use of street drugs. Do not share needles with anyone. Ask for help if you need support or instructions about stopping the use of drugs.  High blood pressure causes heart disease and increases the risk of stroke. Your blood pressure should be checked at least every 1-2  years. Ongoing high blood pressure should be treated with medicines, if weight loss and exercise are not effective.  If you are 26-60 years old, ask your health care provider if you should take aspirin to prevent heart disease.  Diabetes screening is done by taking a blood sample to check your blood glucose level after you have not eaten for a certain period of time (fasting). If you are not overweight and you do not have risk factors for diabetes, you should be screened once every 3 years starting at age 31. If you are overweight or obese and you are 72-82 years of age, you should be screened for diabetes every year as part of your cardiovascular risk assessment.  Colorectal cancer can be detected and often prevented. Most routine colorectal cancer screening begins at the age of 61 and continues through age 28. However, your health care provider may recommend screening at an earlier age if you have risk factors for colon cancer. On a yearly basis, your health care provider may provide home test kits to check for hidden blood in the stool. Use of a small camera at the end of a tube to directly examine the colon (sigmoidoscopy or colonoscopy) can detect the earliest forms of colorectal cancer. Talk to your health care provider about this at age 71, when routine screening begins. Direct exam of the colon should be repeated every 5-10 years through age 38, unless early forms of precancerous polyps or small growths are found.  People who are at an increased risk for hepatitis B should be screened for this virus. You are considered at high risk for hepatitis B if:  You were born in a country where hepatitis B occurs often. Talk with your health care provider about which countries are considered high risk.  Your parents were born in a high-risk country and you have not received a shot to protect against hepatitis B (hepatitis B vaccine).  You have HIV or AIDS.  You use needles to inject street  drugs.  You live with, or have sex with, someone who has hepatitis B.  You are a man who has sex with other men (MSM).  You get hemodialysis treatment.  You take certain medicines for conditions such as cancer, organ transplantation, and autoimmune conditions.  Hepatitis C blood testing is recommended for all people born from [birth year] through 1965 and any individual with known risks for hepatitis C.  Practice safe sex. Use condoms and avoid high-risk sexual practices to reduce the spread of sexually transmitted infections (STIs). STIs include gonorrhea, chlamydia, syphilis, trichomonas, herpes, HPV, and human immunodeficiency virus (HIV). Herpes, HIV, and HPV are viral illnesses that have no cure. They can result in disability, cancer, and death.  If you are a man who has sex with other men, you should be screened at least once per year for:  HIV.  Urethral, rectal, and pharyngeal infection of gonorrhea, chlamydia, or both.  If you are at risk of being infected with  HIV, it is recommended that you take a prescription medicine daily to prevent HIV infection. This is called preexposure prophylaxis (PrEP). You are considered at risk if:  You are a man who has sex with other men (MSM) and have other risk factors.  You are a heterosexual man, are sexually active, and are at increased risk for HIV infection.  You take drugs by injection.  You are sexually active with a partner who has HIV.  Talk with your health care provider about whether you are at high risk of being infected with HIV. If you choose to begin PrEP, you should first be tested for HIV. You should then be tested every 3 months for as long as you are taking PrEP.  A one-time screening for abdominal aortic aneurysm (AAA) and surgical repair of large AAAs by ultrasound are recommended for men ages 3 to 59 years who are current or former smokers.  Healthy men should no longer receive prostate-specific antigen (PSA) blood tests as  part of routine cancer screening. Talk with your health care provider about prostate cancer screening.  Testicular cancer screening is not recommended for adult males who have no symptoms. Screening includes self-exam, a health care provider exam, and other screening tests. Consult with your health care provider about any symptoms you have or any concerns you have about testicular cancer.  Use sunscreen. Apply sunscreen liberally and repeatedly throughout the day. You should seek shade when your shadow is shorter than you. Protect yourself by wearing long sleeves, pants, a wide-brimmed hat, and sunglasses year round, whenever you are outdoors.  Once a month, do a whole-body skin exam, using a mirror to look at the skin on your back. Tell your health care provider about new moles, moles that have irregular borders, moles that are larger than a pencil eraser, or moles that have changed in shape or color.  Stay current with required vaccines (immunizations).  Influenza vaccine. All adults should be immunized every year.  Tetanus, diphtheria, and acellular pertussis (Td, Tdap) vaccine. An adult who has not previously received Tdap or who does not know his vaccine status should receive 1 dose of Tdap. This initial dose should be followed by tetanus and diphtheria toxoids (Td) booster doses every 10 years. Adults with an unknown or incomplete history of completing a 3-dose immunization series with Td-containing vaccines should begin or complete a primary immunization series including a Tdap dose. Adults should receive a Td booster every 10 years.  Varicella vaccine. An adult without evidence of immunity to varicella should receive 2 doses or a second dose if he has previously received 1 dose.  Human papillomavirus (HPV) vaccine. Males aged 11-21 years who have not received the vaccine previously should receive the 3-dose series. Males aged 22-26 years may be immunized. Immunization is recommended through  the age of 103 years for any male who has sex with males and did not get any or all doses earlier. Immunization is recommended for any person with an immunocompromised condition through the age of 74 years if he did not get any or all doses earlier. During the 3-dose series, the second dose should be obtained 4-8 weeks after the first dose. The third dose should be obtained 24 weeks after the first dose and 16 weeks after the second dose.  Zoster vaccine. One dose is recommended for adults aged 25 years or older unless certain conditions are present.  Measles, mumps, and rubella (MMR) vaccine. Adults born before [birth year] generally are considered immune  to measles and mumps. Adults born in [birth year] or later should have 1 or more doses of MMR vaccine unless there is a contraindication to the vaccine or there is laboratory evidence of immunity to each of the three diseases. A routine second dose of MMR vaccine should be obtained at least 28 days after the first dose for students attending postsecondary schools, health care workers, or international travelers. People who received inactivated measles vaccine or an unknown type of measles vaccine during 1963-1967 should receive 2 doses of MMR vaccine. People who received inactivated mumps vaccine or an unknown type of mumps vaccine before 1979 and are at high risk for mumps infection should consider immunization with 2 doses of MMR vaccine. Unvaccinated health care workers born before [birth year] who lack laboratory evidence of measles, mumps, or rubella immunity or laboratory confirmation of disease should consider measles and mumps immunization with 2 doses of MMR vaccine or rubella immunization with 1 dose of MMR vaccine.  Pneumococcal 13-valent conjugate (PCV13) vaccine. When indicated, a person who is uncertain of his immunization history and has no record of immunization should receive the PCV13 vaccine. All adults 60 years of age and older should receive this vaccine. An  adult aged 32 years or older who has certain medical conditions and has not been previously immunized should receive 1 dose of PCV13 vaccine. This PCV13 should be followed with a dose of pneumococcal polysaccharide (PPSV23) vaccine. Adults who are at high risk for pneumococcal disease should obtain the PPSV23 vaccine at least 8 weeks after the dose of PCV13 vaccine. Adults older than 58 years of age who have normal immune system function should obtain the PPSV23 vaccine dose at least 1 year after the dose of PCV13 vaccine.  Pneumococcal polysaccharide (PPSV23) vaccine. When PCV13 is also indicated, PCV13 should be obtained first. All adults aged 64 years and older should be immunized. An adult younger than age 60 years who has certain medical conditions should be immunized. Any person who resides in a nursing home or long-term care facility should be immunized. An adult smoker should be immunized. People with an immunocompromised condition and certain other conditions should receive both PCV13 and PPSV23 vaccines. People with human immunodeficiency virus (HIV) infection should be immunized as soon as possible after diagnosis. Immunization during chemotherapy or radiation therapy should be avoided. Routine use of PPSV23 vaccine is not recommended for American Indians, [place] Natives, or people younger than 65 years unless there are medical conditions that require PPSV23 vaccine. When indicated, people who have unknown immunization and have no record of immunization should receive PPSV23 vaccine. One-time revaccination 5 years after the first dose of PPSV23 is recommended for people aged 19-64 years who have chronic kidney failure, nephrotic syndrome, asplenia, or immunocompromised conditions. People who received 1-2 doses of PPSV23 before age 55 years should receive another dose of PPSV23 vaccine at age 77 years or later if at least 5 years have passed since the previous dose. Doses of PPSV23 are not needed for  people immunized with PPSV23 at or after age 45 years.  Meningococcal vaccine. Adults with asplenia or persistent complement component deficiencies should receive 2 doses of quadrivalent meningococcal conjugate (MenACWY-D) vaccine. The doses should be obtained at least 2 months apart. Microbiologists working with certain meningococcal bacteria, [employer] recruits, people at risk during an outbreak, and people who travel to or live in countries with a high rate of meningitis should be immunized. A first-year college student up through age 31 years who is  living in a residence hall should receive a dose if he did not receive a dose on or after his 16th birthday. Adults who have certain high-risk conditions should receive one or more doses of vaccine.  Hepatitis A vaccine. Adults who wish to be protected from this disease, have chronic liver disease, work with hepatitis A-infected animals, work in hepatitis A research labs, or travel to or work in countries with a high rate of hepatitis A should be immunized. Adults who were previously unvaccinated and who anticipate close contact with an international adoptee during the first 60 days after arrival in the [place] from a country with a high rate of hepatitis A should be immunized.  Hepatitis B vaccine. Adults should be immunized if they wish to be protected from this disease, are under age 34 years and have diabetes, have chronic liver disease, have had more than one sex partner in the past 6 months, may be exposed to blood or other infectious body fluids, are household contacts or sex partners of hepatitis B positive people, are clients or workers in certain care facilities, or travel to or work in countries with a high rate of hepatitis B.  Haemophilus influenzae type b (Hib) vaccine. A previously unvaccinated person with asplenia or sickle cell disease or having a scheduled splenectomy should receive 1 dose of Hib vaccine. Regardless of previous  immunization, a recipient of a hematopoietic stem cell transplant should receive a 3-dose series 6-12 months after his successful transplant. Hib vaccine is not recommended for adults with HIV infection. Preventive Service / Frequency Ages 20 to 72  Blood pressure check.** / Every 3-5 years.  Lipid and cholesterol check.** / Every 5 years beginning at age 54.  Hepatitis C blood test.** / For any individual with known risks for hepatitis C.  Skin self-exam. / Monthly.  Influenza vaccine. / Every year.  Tetanus, diphtheria, and acellular pertussis (Tdap, Td) vaccine.** / Consult your health care provider. 1 dose of Td every 10 years.  Varicella vaccine.** / Consult your health care provider.  HPV vaccine. / 3 doses over 6 months, if 27 or younger.  Measles, mumps, rubella (MMR) vaccine.** / You need at least 1 dose of MMR if you were born in 1957 or later. You may also need a second dose.  Pneumococcal 13-valent conjugate (PCV13) vaccine.** / Consult your health care provider.  Pneumococcal polysaccharide (PPSV23) vaccine.** / 1 to 2 doses if you smoke cigarettes or if you have certain conditions.  Meningococcal vaccine.** / 1 dose if you are age 53 to 68 years and a [employer] researcher living in a residence hall, or have one of several medical conditions. You may also need additional booster doses.  Hepatitis A vaccine.** / Consult your health care provider.  Hepatitis B vaccine.** / Consult your health care provider.  Haemophilus influenzae type b (Hib) vaccine.** / Consult your health care provider. Ages 74 to 69  Blood pressure check.** / Every year.  Lipid and cholesterol check.** / Every 5 years beginning at age 63.  Lung cancer screening. / Every year if you are aged 82-80 years and have a 30-pack-year history of smoking and currently smoke or have quit within the past 15 years. Yearly screening is stopped once you have quit smoking for at least 15 years or develop  a health problem that would prevent you from having lung cancer treatment.  Fecal occult blood test (FOBT) of stool. / Every year beginning at age 73 and continuing until  age 67. You may not have to do this test if you get a colonoscopy every 10 years.  Flexible sigmoidoscopy** or colonoscopy.** / Every 5 years for a flexible sigmoidoscopy or every 10 years for a colonoscopy beginning at age 80 and continuing until age 47.  Hepatitis C blood test.** / For all people born from [birth year] through 1965 and any individual with known risks for hepatitis C.  Skin self-exam. / Monthly.  Influenza vaccine. / Every year.  Tetanus, diphtheria, and acellular pertussis (Tdap/Td) vaccine.** / Consult your health care provider. 1 dose of Td every 10 years.  Varicella vaccine.** / Consult your health care provider.  Zoster vaccine.** / 1 dose for adults aged 65 years or older.  Measles, mumps, rubella (MMR) vaccine.** / You need at least 1 dose of MMR if you were born in 1957 or later. You may also need a second dose.  Pneumococcal 13-valent conjugate (PCV13) vaccine.** / Consult your health care provider.  Pneumococcal polysaccharide (PPSV23) vaccine.** / 1 to 2 doses if you smoke cigarettes or if you have certain conditions.  Meningococcal vaccine.** / Consult your health care provider.  Hepatitis A vaccine.** / Consult your health care provider.  Hepatitis B vaccine.** / Consult your health care provider.  Haemophilus influenzae type b (Hib) vaccine.** / Consult your health care provider. Ages 2 and over  Blood pressure check.** / Every year.  Lipid and cholesterol check.**/ Every 5 years beginning at age 68.  Lung cancer screening. / Every year if you are aged 47-80 years and have a 30-pack-year history of smoking and currently smoke or have quit within the past 15 years. Yearly screening is stopped once you have quit smoking for at least 15 years or develop a health problem that would prevent  you from having lung cancer treatment.  Fecal occult blood test (FOBT) of stool. / Every year beginning at age 72 and continuing until age 75. You may not have to do this test if you get a colonoscopy every 10 years.  Flexible sigmoidoscopy** or colonoscopy.** / Every 5 years for a flexible sigmoidoscopy or every 10 years for a colonoscopy beginning at age 88 and continuing until age 54.  Hepatitis C blood test.** / For all people born from [birth year] through 1965 and any individual with known risks for hepatitis C.  Abdominal aortic aneurysm (AAA) screening.** / A one-time screening for ages 3 to 58 years who are current or former smokers.  Skin self-exam. / Monthly.  Influenza vaccine. / Every year.  Tetanus, diphtheria, and acellular pertussis (Tdap/Td) vaccine.** / 1 dose of Td every 10 years.  Varicella vaccine.** / Consult your health care provider.  Zoster vaccine.** / 1 dose for adults aged 30 years or older.  Pneumococcal 13-valent conjugate (PCV13) vaccine.** / 1 dose for all adults aged 12 years and older.  Pneumococcal polysaccharide (PPSV23) vaccine.** / 1 dose for all adults aged 60 years and older.  Meningococcal vaccine.** / Consult your health care provider.  Hepatitis A vaccine.** / Consult your health care provider.  Hepatitis B vaccine.** / Consult your health care provider.  Haemophilus influenzae type b (Hib) vaccine.** / Consult your health care provider. **Family history and personal history of risk and conditions may change your health care provider's recommendations.   This information is not intended to replace advice given to you by your health care provider. Make sure you discuss any questions you have with your health care provider.   Document Released: 05/12/2001 Document Revised: 04/06/2014  Document Reviewed: 08/11/2010 Elsevier Interactive Patient Education [employer].

## 2015-03-04 NOTE — Telephone Encounter (Signed)
Relation to XA:JLUN Call back number:[phone]   Reason for call:  Patient was told to call and inform PCP of diabetic supply company is called [facility]

## 2015-03-04 NOTE — Assessment & Plan Note (Signed)
Well controlled, no changes to meds. Encouraged heart healthy diet such as the DASH diet and exercise as tolerated.  °

## 2015-03-04 NOTE — Assessment & Plan Note (Signed)
On Levothyroxine, continue to monitor 

## 2015-03-04 NOTE — Progress Notes (Signed)
Subjective:    Patient ID: Steven Ibarra, male    DOB: [birth year], 59 y.o.   MRN: [id number]  Chief Complaint  Patient presents with  . Annual Exam    HPI Patient is in today for annual exam and follow up on numerous concerns. He has had a recurrence of his BCC in head and neck, has tolerated a new round of radiation to heat reasonably well. Has been following with oncology, radiation and optometry. No recent illness. .denies polyuria or polydipsia. Denies CP/palp/SOB/HA/congestion/fevers/GI or GU c/o. Taking meds as prescribed. He has had some sadness and anxiety but feels he manages it well with his faith and declines medications at this time.  Past Medical History  Diagnosis Date  . Hyperlipidemia   . Obesity   . Overweight(278.02) 10/23/2009  . Mixed hyperlipidemia 10/23/2009  . ELEVATED BLOOD PRESSURE 04/23/2010  . DYSPNEA ON EXERTION 10/23/2009  . ALLERGIC RHINITIS, SEASONAL 10/23/2009  . OSA (obstructive sleep apnea)   . SLEEP APNEA, OBSTRUCTIVE 10/23/2009    Sleep study Done at [facility]  . Hearing loss   . Hypertension     Does not see a cardiologist, has not had a stress, echo   . GERD (gastroesophageal reflux disease)   . S/P radiation therapy 07/06/11 - 08/19/11    Right Facial and Right Neck Nodes and right Trigeminal  Nerve to Base of Skull/ Total Dose 6600 cGy/ 33 Fractions  . Preventative health care 09/30/2011  . Hypothyroid 01/28/2012  . Otitis externa of right ear 07/06/2012  . Pulled muscle 07/06/12  . Cancer ([place])   . Skin cancer     on nose, 9-10 yrs ago  . Basal cell carcinoma of neck 05/28/2011    r suprahyoid, radical neck dissection S/p 6 weeks of targeted radiation therapy   . Lung cancer ([place]) 03/08/14    invasive squamous cell carcinoma  . Diabetes mellitus approx 2003    type 2  . DIABETES MELLITUS, TYPE II 10/23/2009  . Type II or unspecified type diabetes mellitus with unspecified complication, uncontrolled     type 2     Past Surgical History    Procedure Laterality Date  . Tonsillectomy and adenoidectomy    . Skin cancer removal    . Radical neck dissection  05/28/2011    Procedure: RADICAL NECK DISSECTION;  Surgeon: Melissa Montane, MD;  Location: [place];  Service: ENT;  Laterality: N/A;  Suprahyoid Neck Dissection  . Lung biopsy Right 03/08/2014    Family History  Problem Relation Age of Onset  . Other Mother     CHF  . Stroke Father   . Hyperlipidemia Father   . Heart disease Father     s/p valve replacement  . Hyperlipidemia Brother   . Obesity Brother   . Other Brother     Back pain  . Hyperlipidemia Brother   . Hypertension Brother   . Other Brother     Panic attacks  . Hyperlipidemia Brother   . Anesthesia problems Neg Hx     Social History   Social History  . Marital Status: Married    Spouse Name: N/A  . Number of Children: N/A  . Years of Education: N/A   Occupational History  . Not on file.   Social History Main Topics  . Smoking status: Former Smoker -- 1.00 packs/day for 9 years    Types: Cigarettes    Quit date: 03/31/1975  . Smokeless tobacco: Never Used  . Alcohol  Use: No     Comment: 4 beers a month, 06/12/11 rarely uses now  . Drug Use: No  . Sexual Activity: Yes   Other Topics Concern  . Not on file   Social History Narrative   Patient is married.   Patient with a history of smoking one pack per day for approximately 29 years from ages of [age]. Patient denies ever having used smokeless tobacco. Patient with rare use of alcohol.      Mother died at age 48 from congestive heart failure complications. Father died at the age of 56 secondary to a stroke.    Outpatient Prescriptions Prior to Visit  Medication Sig Dispense Refill  . Ascorbic Acid (VITAMIN C) 1000 MG tablet Take 1,000 mg by mouth daily.    Marland Kitchen aspirin 81 MG tablet Take 81 mg by mouth daily.    . B-D ULTRAFINE III SHORT PEN 31G X 8 MM MISC USE TWICE DAILY AS DIRECTED WITH LANTUS DX: 250.0 100 each 5  . cetirizine (ZYRTEC)  10 MG tablet Take 10 mg by mouth daily as needed for allergies.     . fenofibrate 160 MG tablet TAKE 1 TABLET BY MOUTH EVERY DAY.Marland Kitchen MAX OF 30 DAYS ON INSURANCE 90 tablet 1  . ibuprofen (ADVIL,MOTRIN) 200 MG tablet Take 600 mg by mouth every 6 (six) hours as needed for pain.     Marland Kitchen insulin aspart (NOVOLOG) 100 UNIT/ML injection 20 units tid for high blood sugar. Test daily after dinner and see sliding scale for blood sugar  Please dispense the amount pt needs for 1 month 200 mL 5  . Krill Oil 300 MG CAPS Take 1 capsule by mouth daily.    Marland Kitchen LANTUS SOLOSTAR 100 UNIT/ML Solostar Pen INJECT 100 UNITS IN MORNING AND 60 UNITS IN EVENING 60 mL 1  . levothyroxine (SYNTHROID, LEVOTHROID) 75 MCG tablet TAKE 1 TABLET (75 MCG TOTAL) BY MOUTH DAILY. 30 tablet 5  . losartan (COZAAR) 50 MG tablet TAKE 1 TABLET (50 MG TOTAL) BY MOUTH DAILY. 30 tablet 5  . metFORMIN (GLUCOPHAGE) 500 MG tablet Take 2 tablets by mouth 2 times a day. 120 tablet 4  . Misc Natural Products (OSTEO BI-FLEX TRIPLE STRENGTH PO) Take 2 tablets by mouth daily.    . NON FORMULARY 1 each by Other route 4 (four) times daily. Accu-check multiclix lancets- use as directed    . Probiotic Product (PROBIOTIC DAILY PO) Take by mouth.    . ranitidine (ZANTAC) 300 MG tablet TAKE 1 TABLET (300 MG TOTAL) BY MOUTH AT BEDTIME. 30 tablet 3  . Syringe, Disposable, 3 ML MISC DX: E11.9/E11.65  Use bid as directed 100 each 5  . ACCU-CHEK AVIVA PLUS test strip USE AS DIRECTED 100 each 6  . ACCU-CHEK FASTCLIX LANCETS MISC USE AS DIRECTED 102 each 1  . ACCU-CHEK FASTCLIX LANCETS MISC USE AS DIRECTED 102 each 1  . fenofibrate 160 MG tablet Take 160 mg by mouth daily.   2  . rosuvastatin (CRESTOR) 20 MG tablet Take 1 tablet (20 mg total) by mouth daily. 30 tablet 4   No facility-administered medications prior to visit.    No Known Allergies  Review of Systems  Constitutional: Positive for malaise/fatigue. Negative for fever and chills.  HENT: Negative for  congestion and hearing loss.   Eyes: Negative for discharge.  Respiratory: Negative for cough, sputum production and shortness of breath.   Cardiovascular: Negative for chest pain, palpitations and leg swelling.  Gastrointestinal: Negative for heartburn,  nausea, vomiting, abdominal pain, diarrhea, constipation and blood in stool.  Genitourinary: Negative for dysuria, urgency, frequency and hematuria.  Musculoskeletal: Negative for myalgias, back pain and falls.  Skin: Negative for rash.  Neurological: Negative for dizziness, sensory change, loss of consciousness, weakness and headaches.  Endo/Heme/Allergies: Negative for environmental allergies. Does not bruise/bleed easily.  Psychiatric/Behavioral: Positive for depression and suicidal ideas. The patient is not nervous/anxious and does not have insomnia.        Objective:    Physical Exam  Constitutional: He is oriented to person, place, and time. He appears well-developed and well-nourished. No distress.  HENT:  Head: Normocephalic and atraumatic.  Eyes: Conjunctivae are normal.  Neck: Neck supple. No thyromegaly present.  Scars on neck  Cardiovascular: Normal rate, regular rhythm and normal heart sounds.   No murmur heard. Pulmonary/Chest: Effort normal and breath sounds normal. No respiratory distress. He has no wheezes.  Abdominal: Soft. Bowel sounds are normal. He exhibits no mass. There is no tenderness.  Musculoskeletal: He exhibits no edema.  Lymphadenopathy:    He has no cervical adenopathy.  Neurological: He is alert and oriented to person, place, and time.  Skin: Skin is warm and dry.  Psychiatric: He has a normal mood and affect. His behavior is normal.    BP 122/84 mmHg  Pulse 97  Temp(Src) 97.8 F (36.6 C) (Oral)  Ht '6\' 6"'  (1.981 m)  Wt 310 lb 6 oz (140.785 kg)  BMI 35.87 kg/m2  SpO2 94% Wt Readings from Last 3 Encounters:  03/04/15 310 lb 6 oz (140.785 kg)  02/18/15 314 lb 12.8 oz (142.792 kg)  02/11/15  310 lb 3.2 oz (140.706 kg)     Lab Results  Component Value Date   WBC 5.1 02/28/2015   HGB 15.2 02/28/2015   HCT 44.8 02/28/2015   PLT 164.0 02/28/2015   GLUCOSE 200* 02/28/2015   CHOL 114 02/28/2015   TRIG 347.0* 02/28/2015   HDL 21.70* 02/28/2015   LDLDIRECT 46.0 02/28/2015   LDLCALC 18 09/08/2013   ALT 20 02/28/2015   AST 15 02/28/2015   NA 139 02/28/2015   K 4.3 02/28/2015   CL 103 02/28/2015   CREATININE 1.20 02/28/2015   BUN 19 02/28/2015   CO2 29 02/28/2015   TSH 5.08* 02/28/2015   PSA 1.46 09/25/2011   INR 0.95 03/08/2014   HGBA1C 8.0* 02/28/2015   MICROALBUR 1.0 02/28/2015    Lab Results  Component Value Date   TSH 5.08* 02/28/2015   Lab Results  Component Value Date   WBC 5.1 02/28/2015   HGB 15.2 02/28/2015   HCT 44.8 02/28/2015   MCV 88.1 02/28/2015   PLT 164.0 02/28/2015   Lab Results  Component Value Date   NA 139 02/28/2015   K 4.3 02/28/2015   CO2 29 02/28/2015   GLUCOSE 200* 02/28/2015   BUN 19 02/28/2015   CREATININE 1.20 02/28/2015   BILITOT 0.6 02/28/2015   ALKPHOS 43 02/28/2015   AST 15 02/28/2015   ALT 20 02/28/2015   PROT 6.8 02/28/2015   ALBUMIN 4.1 02/28/2015   CALCIUM 9.3 02/28/2015   ANIONGAP 12 03/09/2014   EGFR 70* 01/10/2015   GFR 66.01 02/28/2015   Lab Results  Component Value Date   CHOL 114 02/28/2015   Lab Results  Component Value Date   HDL 21.70* 02/28/2015   Lab Results  Component Value Date   LDLCALC 18 09/08/2013   Lab Results  Component Value Date   TRIG 347.0* 02/28/2015  Lab Results  Component Value Date   CHOLHDL 5 02/28/2015   Lab Results  Component Value Date   HGBA1C 8.0* 02/28/2015       Assessment & Plan:   Problem List Items Addressed This Visit    Basal cell carcinoma of neck    Has had a recurrence. Has tolerated new round of radiation      Diabetes mellitus type 2 in obese (HCC)    hgba1c acceptable, minimize simple carbs. Increase exercise as tolerated. Continue  current meds      Relevant Medications   rosuvastatin (CRESTOR) 40 MG tablet   Other Relevant Orders   TSH   CBC   Lipid panel   Hemoglobin A1c   Microalbumin / creatinine urine ratio   GERD (gastroesophageal reflux disease)    hgba1c acceptable, minimize simple carbs. Increase exercise as tolerated. Continue current meds      Hypertension - Primary    Well controlled, no changes to meds. Encouraged heart healthy diet such as the DASH diet and exercise as tolerated.       Relevant Medications   rosuvastatin (CRESTOR) 40 MG tablet   Other Relevant Orders   TSH   CBC   Lipid panel   Hemoglobin A1c   Microalbumin / creatinine urine ratio   Hepatitis C Antibody   HIV antibody (with reflex)   Hypothyroid    On Levothyroxine, continue to monitor      MIXED HYPERLIPIDEMIA   Relevant Medications   rosuvastatin (CRESTOR) 40 MG tablet   Other Relevant Orders   TSH   CBC   Lipid panel   Hemoglobin A1c   Microalbumin / creatinine urine ratio   Otitis externa of right ear    On Levothyroxine, continue to monitor      Relevant Orders   TSH   CBC   Lipid panel   Hemoglobin A1c   Microalbumin / creatinine urine ratio   Overweight    Encouraged DASH diet, decrease po intake and increase exercise as tolerated. Needs 7-8 hours of sleep nightly. Avoid trans fats, eat small, frequent meals every 4-5 hours with lean proteins, complex carbs and healthy fats. Minimize simple carbs, GMO foods.      Preventative health care    Patient encouraged to maintain heart healthy diet, regular exercise, adequate sleep. Consider daily probiotics. Take medications as prescribed. Labs reviewed. jas seen eye doctor, Dr Bing Plume recently       Other Visit Diagnoses    Obstructive sleep apnea        Relevant Orders    Ambulatory referral to Pulmonology    TSH    CBC    Lipid panel    Hemoglobin A1c    Microalbumin / creatinine urine ratio    Screening for viral disease        Relevant  Orders    Hepatitis C Antibody    HIV antibody (with reflex)    Need for vaccination with 13-polyvalent pneumococcal conjugate vaccine        Relevant Orders    Pneumococcal conjugate vaccine 13-valent (Completed)       I have discontinued Mr. Bradburn rosuvastatin, ACCU-CHEK AVIVA PLUS, and ACCU-CHEK FASTCLIX LANCETS. I am also having him start on rosuvastatin. Additionally, I am having him maintain his NON FORMULARY, cetirizine, ibuprofen, Misc Natural Products (OSTEO BI-FLEX TRIPLE STRENGTH PO), vitamin C, aspirin, Krill Oil, Probiotic Product (PROBIOTIC DAILY PO), Syringe (Disposable), levothyroxine, insulin aspart, ranitidine, fenofibrate, metFORMIN, LANTUS SOLOSTAR, losartan, and B-D ULTRAFINE  III SHORT PEN.  Meds ordered this encounter  Medications  . rosuvastatin (CRESTOR) 40 MG tablet    Sig: Take 1 tablet (40 mg total) by mouth daily.    Dispense:  30 tablet    Refill:  5  . DISCONTD: glucose blood (ACCU-CHEK AVIVA PLUS) test strip    Sig: USE AS DIRECTED    Dispense:  100 each    Refill:  5     Penni Homans, MD

## 2015-03-05 MED ORDER — BAYER CONTOUR MONITOR DEVI: Status: DC

## 2015-03-05 MED ORDER — GLUCOSE BLOOD VI STRP: ORAL_STRIP | Status: DC

## 2015-03-05 MED ORDER — BAYER MICROLET LANCETS MISC: Status: DC

## 2015-03-05 NOTE — Telephone Encounter (Signed)
Sent in the [facility] product for testing supplies. After speaking to the patient he informed that product was an option as well.

## 2015-03-06 ENCOUNTER — Telehealth: Payer: Self-pay | Admitting: *Deleted

## 2015-03-06 NOTE — Telephone Encounter (Signed)
Initiated, awaiting determination. JG//CMA

## 2015-03-09 NOTE — Assessment & Plan Note (Signed)
Patient encouraged to maintain heart healthy diet, regular exercise, adequate sleep. Consider daily probiotics. Take medications as prescribed. Labs reviewed. jas seen eye doctor, Dr Bing Plume recently

## 2015-03-09 NOTE — Assessment & Plan Note (Signed)
Encouraged DASH diet, decrease po intake and increase exercise as tolerated. Needs 7-8 hours of sleep nightly. Avoid trans fats, eat small, frequent meals every 4-5 hours with lean proteins, complex carbs and healthy fats. Minimize simple carbs, GMO foods. 

## 2015-03-09 NOTE — Assessment & Plan Note (Signed)
On Levothyroxine, continue to monitor 

## 2015-03-09 NOTE — Assessment & Plan Note (Signed)
hgba1c acceptable, minimize simple carbs. Increase exercise as tolerated. Continue current meds 

## 2015-03-09 NOTE — Assessment & Plan Note (Signed)
Has had a recurrence. Has tolerated new round of radiation

## 2015-03-12 ENCOUNTER — Telehealth: Payer: Self-pay | Admitting: *Deleted

## 2015-03-12 NOTE — Telephone Encounter (Signed)
CALLED PATIENT TO INFORM THAT FU APPT. HAS BEEN MOVED TO 04-08-15 @ 8 AM, SPOKE WITH PATIENT AND HE IS AWARE OF THIS APPT. CHANGE.

## 2015-03-15 ENCOUNTER — Other Ambulatory Visit: Payer: Self-pay | Admitting: Family Medicine

## 2015-03-15 ENCOUNTER — Ambulatory Visit
Admission: RE | Admit: 2015-03-15 | Payer: BLUE CROSS/BLUE SHIELD | Source: Ambulatory Visit | Admitting: Radiation Oncology

## 2015-03-17 ENCOUNTER — Other Ambulatory Visit: Payer: Self-pay | Admitting: Family Medicine

## 2015-03-19 MED ORDER — BAYER CONTOUR MONITOR DEVI: Status: DC

## 2015-03-19 MED ORDER — BAYER MICROLET LANCETS MISC: Status: DC

## 2015-03-19 MED ORDER — GLUCOSE BLOOD VI STRP: ORAL_STRIP | Status: DC

## 2015-03-19 NOTE — Telephone Encounter (Addendum)
Called patient's insurance [[employer] [place]] to inquire as to formulary Rx for Diabetic testing supplies, as Accu-Chek Aviva test strips were Denied on PA request and received information as to covered Rx [Bayer Contour], free via [facility] [[phone]]. Called [facility] and gave patient information per Insurance; patient will receive meter kit including glucose meter [Contour Next Easy], 10 test strips, 10 lancets, bottle of control solution, information on usage and other supplies they can get free, shipped UPS in [2] business days. Rx for Contour Next Easy test strips and lancets sent to pharmacy/[facility]

## 2015-03-19 NOTE — Addendum Note (Signed)
Addended by: Rockwell Germany on: 03/19/2015 10:57 AM   Modules accepted: Orders

## 2015-03-20 ENCOUNTER — Other Ambulatory Visit: Payer: Self-pay | Admitting: Family Medicine

## 2015-04-04 ENCOUNTER — Ambulatory Visit: Payer: BLUE CROSS/BLUE SHIELD | Admitting: Internal Medicine

## 2015-04-08 ENCOUNTER — Ambulatory Visit
Admission: RE | Admit: 2015-04-08 | Discharge: 2015-04-08 | Disposition: A | Discharge status: Home / Self Care | Payer: BLUE CROSS/BLUE SHIELD | Source: Ambulatory Visit | Attending: Radiation Oncology | Admitting: Radiation Oncology

## 2015-04-08 ENCOUNTER — Encounter: Payer: Self-pay | Admitting: Radiation Oncology

## 2015-04-08 VITALS — BP 133/86 | HR 89 | Temp 97.7°F | Ht 78.0 in | Wt 317.9 lb

## 2015-04-08 DIAGNOSIS — C7802 Secondary malignant neoplasm of left lung: Secondary | ICD-10-CM

## 2015-04-08 DIAGNOSIS — C7801 Secondary malignant neoplasm of right lung: Secondary | ICD-10-CM

## 2015-04-08 NOTE — Progress Notes (Signed)
Radiation Oncology         [phone] ________________________________  Name: Steven Ibarra MRN: [id number]  Date: 04/08/2015  DOB: 08-Jun-1955  Follow-Up Visit Note  Outpatient  CC: Penni Homans, MD  Melissa Montane, MD  Diagnosis and Prior Radiotherapy:     ICD-9-CM ICD-10-CM   1. Malignant neoplasm metastatic to left lung ([place]) 197.0 C78.02    Diagnosis:   Basal Cell Carcinoma of the skin with squamous differentiation, Metastatic to the Submandibular Lymph Node, Right Neck -- now with bilateral lung metastases (Stage IV)  Indication for treatment:  aggressive local control    Radiation treatment dates:   02/11/2015-02/20/2015  Site/dose:   1) Right lung,  superior upper lobe / 50 Gy in 5 fractions  2) Left lung, posterior lower lobe / 50 Gy in 5 fractions   Radiation treatment dates:   05/23/2014, 05/25/2014, 05/28/2014, 05/30/2014, 06/01/2014, 08/19/11 Site/dose:    1) Right upper lung mass / 54 Gy in 3 fractions 2) Left lower lung mass and adjacent nodules / 50 Gy in 5 fractions  OTHER: Completed total dose of 66Gy in 33 fractions to the right facial nodes through the right neck and right trigeminal nerve to base of skull on 08-19-11  Narrative:  The patient returns today for routine follow-up.  He denies pain at this time, but does state at times he feels "uncomfortable" in his chest area, but he does not feel a need to take anything for this. He reports some shortness of breath with exertion, but at rest he denies any breathing difficulties. He also reports a good appetite, and states he is trying to eat healthy. He is also working full time as a Forensic scientist. Does Heavy lifting which he thinks may add to chest discomfort. Wants to exercise more, however. He reports his fatigue has improved, and states that every day that goes by he feels his strength improve.    ALLERGIES:  has No Known Allergies.  Meds: Current Outpatient Prescriptions  Medication Sig Dispense Refill  . Ascorbic  Acid (VITAMIN C) 1000 MG tablet Take 1,000 mg by mouth daily.    Marland Kitchen aspirin 81 MG tablet Take 81 mg by mouth daily.    . B-D ULTRAFINE III SHORT PEN 31G X 8 MM MISC USE TWICE DAILY AS DIRECTED WITH LANTUS DX: 250.0 100 each 5  . BAYER MICROLET LANCETS lancets Use astwice daily to check blood sugar.  DX E11.9 BAYER CONTOUR NEXT EASY PER INSURANCE 100 each 6  . Blood Glucose Monitoring Suppl (CONTOUR BLOOD GLUCOSE SYSTEM) DEVI Use twice daily to check blood sugar.  DX E11.9 1 Device 0  . cetirizine (ZYRTEC) 10 MG tablet Take 10 mg by mouth daily as needed for allergies.     . fenofibrate 160 MG tablet TAKE 1 TABLET BY MOUTH EVERY DAY.Marland Kitchen MAX OF 30 DAYS ON INSURANCE 90 tablet 1  . glucose blood (BAYER CONTOUR TEST) test strip Use twice daily to check blood sugar.  DX E11.9 BAYER CONTOUR NEXT EASY PER INSURANCE 100 each 6  . ibuprofen (ADVIL,MOTRIN) 200 MG tablet Take 600 mg by mouth every 6 (six) hours as needed for pain.     Javier Docker Oil 300 MG CAPS Take 1 capsule by mouth daily.    Marland Kitchen LANTUS SOLOSTAR 100 UNIT/ML Solostar Pen INJECT 100 UNITS IN MORNING AND 60 UNITS IN EVENING 60 mL 1  . levothyroxine (SYNTHROID, LEVOTHROID) 75 MCG tablet TAKE 1 TABLET (75 MCG TOTAL) BY MOUTH DAILY. 30 tablet  1  . losartan (COZAAR) 50 MG tablet TAKE 1 TABLET (50 MG TOTAL) BY MOUTH DAILY. 30 tablet 5  . metFORMIN (GLUCOPHAGE) 500 MG tablet Take 2 tablets by mouth 2 times a day. 120 tablet 4  . Misc Natural Products (OSTEO BI-FLEX TRIPLE STRENGTH PO) Take 2 tablets by mouth daily.    . NON FORMULARY 1 each by Other route 4 (four) times daily. Accu-check multiclix lancets- use as directed    . NOVOLOG 100 UNIT/ML injection USE 20 UNITS 3 TIMES A DAY FOR HIGH BLOOD SUGAR, TEST DAILY AFTER DINNER AND SEE SLIDING SCALE 20 mL 4  . Probiotic Product (PROBIOTIC DAILY PO) Take by mouth.    . ranitidine (ZANTAC) 300 MG tablet TAKE 1 TABLET (300 MG TOTAL) BY MOUTH AT BEDTIME. 30 tablet 3  . rosuvastatin (CRESTOR) 40 MG tablet Take 1  tablet (40 mg total) by mouth daily. 30 tablet 5  . Syringe, Disposable, 3 ML MISC DX: E11.9/E11.65  Use bid as directed 100 each 5   No current facility-administered medications for this encounter.   Physical Findings: The patient is in no acute distress. Patient is alert and oriented.  height is '6\' 6"'$  (1.981 m) and weight is 317 lb 14.4 oz (144.198 kg). His temperature is 97.7 F (36.5 C). His blood pressure is 133/86 and his pulse is 89. His oxygen saturation is 97%. .    Lungs are clear to auscultation bilaterally. Heart has regular rate and rhythm with no murmurs. No palpable cervical, supraclavicular, or axillary adenopathy.    Lab Findings: Lab Results  Component Value Date   WBC 5.1 02/28/2015   HGB 15.2 02/28/2015   HCT 44.8 02/28/2015   MCV 88.1 02/28/2015   PLT 164.0 02/28/2015   Lab Results  Component Value Date   TSH 5.08* 02/28/2015   CMP     Component Value Date/Time   NA 139 02/28/2015 0826   K 4.3 02/28/2015 0826   CL 103 02/28/2015 0826   CO2 29 02/28/2015 0826   GLUCOSE 200* 02/28/2015 0826   BUN 19 02/28/2015 0826   BUN 18.9 01/10/2015 0821   CREATININE 1.20 02/28/2015 0826   CREATININE 1.1 01/10/2015 0821   CREATININE 1.11 09/08/2013 0936   CALCIUM 9.3 02/28/2015 0826   PROT 6.8 02/28/2015 0826   ALBUMIN 4.1 02/28/2015 0826   AST 15 02/28/2015 0826   ALT 20 02/28/2015 0826   ALKPHOS 43 02/28/2015 0826   BILITOT 0.6 02/28/2015 0826   GFRNONAA >90 03/09/2014 0243   GFRAA >90 03/09/2014 0243     Radiographic Findings: No results found.  Impression/Plan:  Doing well after SBRT to chest  Will followup after restaging chest CT in second half of February. _____________________________________   [name], MD

## 2015-04-08 NOTE — Progress Notes (Signed)
Mr. Franze is here for follow up of radiation completed 02/20/15 to his Right Lung and Left Lung. He denies pain at this time, but does state at times he feels "uncomfortable" in his chest area, but he does not take anything for this. He reports some shortness of breath with exertion, but at rest he denies any breathing difficulties. He also reports a good appetite, and states he is trying to eat healthy. He is also working full time as a Forensic scientist. He reports his fatigue has improved, and states that every day that goes by he feels his strength improve.   BP 133/86 mmHg  Pulse 89  Temp(Src) 97.7 F (36.5 C)  Ht '6\' 6"'$  (1.981 m)  Wt 317 lb 14.4 oz (144.198 kg)  BMI 36.74 kg/m2  SpO2 97%   Wt Readings from Last 3 Encounters:  04/08/15 317 lb 14.4 oz (144.198 kg)  03/04/15 310 lb 6 oz (140.785 kg)  02/18/15 314 lb 12.8 oz (142.792 kg)

## 2015-04-09 ENCOUNTER — Telehealth: Payer: Self-pay | Admitting: *Deleted

## 2015-04-09 NOTE — Telephone Encounter (Signed)
CALLED PATIENT TO INFORM OF SCAN AND FU, LVM FOR A RETURN CALL 

## 2015-04-14 ENCOUNTER — Other Ambulatory Visit: Payer: Self-pay | Admitting: Family Medicine

## 2015-05-09 ENCOUNTER — Ambulatory Visit: Payer: Self-pay | Admitting: Podiatry

## 2015-05-16 ENCOUNTER — Ambulatory Visit (HOSPITAL_COMMUNITY)
Admission: RE | Admit: 2015-05-16 | Discharge: 2015-05-16 | Disposition: A | Discharge status: Home / Self Care | Payer: BLUE CROSS/BLUE SHIELD | Source: Ambulatory Visit | Attending: Radiation Oncology | Admitting: Radiation Oncology

## 2015-05-16 DIAGNOSIS — C801 Malignant (primary) neoplasm, unspecified: Secondary | ICD-10-CM | POA: Diagnosis present

## 2015-05-16 DIAGNOSIS — E279 Disorder of adrenal gland, unspecified: Secondary | ICD-10-CM | POA: Diagnosis not present

## 2015-05-16 DIAGNOSIS — C7801 Secondary malignant neoplasm of right lung: Secondary | ICD-10-CM | POA: Insufficient documentation

## 2015-05-16 DIAGNOSIS — C7802 Secondary malignant neoplasm of left lung: Secondary | ICD-10-CM | POA: Insufficient documentation

## 2015-05-16 LAB — POCT I-STAT CREATININE: Creatinine, Ser: 1 mg/dL (ref 0.61–1.24)

## 2015-05-16 MED ORDER — IOHEXOL 300 MG/ML  SOLN
75.0000 mL | Freq: Once | INTRAMUSCULAR | Status: AC | PRN
Start: 2015-05-16 — End: 2015-05-16
  Administered 2015-05-16: 75 mL via INTRAVENOUS

## 2015-05-17 ENCOUNTER — Ambulatory Visit: Payer: Self-pay | Admitting: Radiation Oncology

## 2015-05-17 ENCOUNTER — Telehealth: Payer: Self-pay

## 2015-05-17 NOTE — Telephone Encounter (Signed)
I called and left a voice mail for Mr. Steven Ibarra telling him that his CT report was good and the radiologist reported no signs of progression. There was a good response to treatment per the radiologist. Dr. Isidore Moos looks forward to seeing him in March for an exam. I asked him to call if he had any questions or concerns.

## 2015-05-19 ENCOUNTER — Other Ambulatory Visit: Payer: Self-pay | Admitting: Family Medicine

## 2015-05-29 ENCOUNTER — Other Ambulatory Visit: Payer: Self-pay | Admitting: Family Medicine

## 2015-05-29 ENCOUNTER — Ambulatory Visit
Admission: RE | Admit: 2015-05-29 | Discharge: 2015-05-29 | Disposition: A | Discharge status: Home / Self Care | Payer: BLUE CROSS/BLUE SHIELD | Source: Ambulatory Visit | Attending: Radiation Oncology | Admitting: Radiation Oncology

## 2015-05-29 ENCOUNTER — Encounter: Payer: Self-pay | Admitting: Radiation Oncology

## 2015-05-29 ENCOUNTER — Telehealth: Payer: Self-pay | Admitting: *Deleted

## 2015-05-29 VITALS — BP 155/85 | HR 87 | Temp 97.7°F | Ht 78.0 in | Wt 316.7 lb

## 2015-05-29 DIAGNOSIS — C7801 Secondary malignant neoplasm of right lung: Secondary | ICD-10-CM

## 2015-05-29 DIAGNOSIS — C7802 Secondary malignant neoplasm of left lung: Secondary | ICD-10-CM

## 2015-05-29 NOTE — Telephone Encounter (Signed)
CALLED PATIENT TO INFORM OF PET ON 08-15-15 AND HIS FU ON 08-16-15, LVM FOR A RETURN CALL

## 2015-05-29 NOTE — Progress Notes (Signed)
Mr. Frane is here for follow up of radiation completed 02/20/15 to his Right Lung, and Left Lung. He reports feeling tired the last couple of days. He is not sure if he might be getting a cold, as his wife is sick at this time with a cough and sore throat, and nasal congestion. He reports his breathing is normal except with exertion which states makes "him more winded than normal". He is trying to increase his physical activity, and is looking into buying a treadmill. He denies any pain.   BP 155/85 mmHg  Pulse 87  Temp(Src) 97.7 F (36.5 C)  Ht '6\' 6"'$  (1.981 m)  Wt 316 lb 11.2 oz (143.654 kg)  BMI 36.61 kg/m2  SpO2 98%   Wt Readings from Last 3 Encounters:  05/29/15 316 lb 11.2 oz (143.654 kg)  04/08/15 317 lb 14.4 oz (144.198 kg)  03/04/15 310 lb 6 oz (140.785 kg)

## 2015-05-29 NOTE — Progress Notes (Signed)
Radiation Oncology         [phone] ________________________________  Name: AIVAN FILLINGIM MRN: [id number]  Date: 05/29/2015  DOB: 1956-03-07  Follow-Up Visit Note  Outpatient  CC: Penni Homans, MD  Melissa Montane, MD  Diagnosis and Prior Radiotherapy:     ICD-9-CM ICD-10-CM   1. Malignant neoplasm metastatic to right lung ([place]) 197.0 C78.01 NM PET Image Restag (PS) Skull Base To Thigh  2. 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)  Indication for treatment:  aggressive local control    Radiation treatment dates:   02/11/2015-02/20/2015  Site/dose:   1) Right lung,  superior upper lobe / 50 Gy in 5 fractions 2) Left lung, posterior lower lobe / 50 Gy in 5 fractions   Radiation treatment dates:   05/23/2014, 05/25/2014, 05/28/2014, 05/30/2014, 06/01/2014, 08/19/11 Site/dose:    1) Right upper lung mass / 54 Gy in 3 fractions 2) Left lower lung mass and adjacent nodules / 50 Gy in 5 fractions  OTHER: Completed total dose of 66Gy in 33 fractions to the right facial nodes through the right neck and right trigeminal nerve to base of skull on 08-19-11  Narrative:  The patient returns today for routine follow-up.   Feels a bit tired today, but may be fighting a cold.  Feels a bit more "winded" with exertion. Intends to start exercising after getting a treadmill.   ALLERGIES:  has No Known Allergies.  Meds: Current Outpatient Prescriptions  Medication Sig Dispense Refill  . Ascorbic Acid (VITAMIN C) 1000 MG tablet Take 1,000 mg by mouth daily.    . B-D ULTRAFINE III SHORT PEN 31G X 8 MM MISC USE TWICE DAILY AS DIRECTED WITH LANTUS DX: 250.0 100 each 5  . B-D ULTRAFINE III SHORT PEN 31G X 8 MM MISC USE TWICE DAILY AS DIRECTED WITH LANTUS DX:  250.0 100 each 5  . BAYER MICROLET LANCETS lancets Use astwice daily to check blood sugar.  DX E11.9 BAYER CONTOUR NEXT EASY PER INSURANCE 100 each 6  . Blood Glucose Monitoring Suppl (CONTOUR BLOOD GLUCOSE SYSTEM) DEVI Use twice daily to check blood sugar.  DX E11.9 1 Device 0  . cetirizine (ZYRTEC) 10 MG tablet Take 10 mg by mouth daily as needed for allergies.     . fenofibrate 160 MG tablet TAKE 1 TABLET BY MOUTH EVERY DAY.Marland Kitchen MAX OF 30 DAYS ON INSURANCE 90 tablet 1  . glucose blood (BAYER CONTOUR TEST) test strip Use twice daily to check blood sugar.  DX E11.9 BAYER CONTOUR NEXT EASY PER INSURANCE 100 each 6  . ibuprofen (ADVIL,MOTRIN) 200 MG tablet Take 600 mg by mouth every 6 (six) hours as needed for pain.     Javier Docker Oil 300 MG CAPS Take 1 capsule by mouth daily.    Marland Kitchen LANTUS SOLOSTAR 100 UNIT/ML Solostar Pen INJECT 100 UNITS IN MORNING AND 60 UNITS IN EVENING 60 mL 1  . levothyroxine (SYNTHROID, LEVOTHROID) 75 MCG tablet TAKE 1 TABLET (75 MCG TOTAL) BY MOUTH DAILY. 30 tablet 1  . losartan (COZAAR) 50 MG tablet TAKE 1 TABLET (50 MG TOTAL) BY MOUTH DAILY. 30 tablet 5  . metFORMIN (GLUCOPHAGE) 500 MG tablet Take 2 tablets by mouth 2 times a day. 120 tablet 4  .  Misc Natural Products (OSTEO BI-FLEX TRIPLE STRENGTH PO) Take 2 tablets by mouth daily.    . NON FORMULARY 1 each by Other route 4 (four) times daily. Accu-check multiclix lancets- use as directed    . NOVOLOG 100 UNIT/ML injection USE 20 UNITS 3 TIMES A DAY FOR HIGH BLOOD SUGAR, TEST DAILY AFTER DINNER AND SEE SLIDING SCALE 20 mL 4  . Probiotic Product (PROBIOTIC DAILY PO) Take by mouth.    . ranitidine (ZANTAC) 300 MG tablet TAKE 1 TABLET (300 MG TOTAL) BY MOUTH AT BEDTIME. 30 tablet 3  . rosuvastatin (CRESTOR) 40 MG tablet Take 1 tablet (40 mg total) by mouth daily. 30 tablet 5  . Syringe, Disposable, 3 ML MISC DX: E11.9/E11.65  Use bid as directed 100 each 5  . aspirin 81 MG tablet Take 81 mg by mouth daily. Reported on 05/29/2015      No current facility-administered medications for this encounter.   Physical Findings: The patient is in no acute distress. Patient is alert and oriented.  height is '6\' 6"'$  (1.981 m) and weight is 316 lb 11.2 oz (143.654 kg). His temperature is 97.7 F (36.5 C). His blood pressure is 155/85 and his pulse is 87. His oxygen saturation is 98%. .    Lungs are clear to auscultation bilaterally.  Heart- regular rate and rhythm with no murmurs.  No palpable cervical, supraclavicular, or axillary adenopathy.   Oral cavity/oropharynx without lesions. Ambulatory. Skin intact, no lesions appreciated over neck/face   Lab Findings: Lab Results  Component Value Date   WBC 5.1 02/28/2015   HGB 15.2 02/28/2015   HCT 44.8 02/28/2015   MCV 88.1 02/28/2015   PLT 164.0 02/28/2015   Lab Results  Component Value Date   TSH 5.08* 02/28/2015   CMP     Component Value Date/Time   NA 139 02/28/2015 0826   K 4.3 02/28/2015 0826   CL 103 02/28/2015 0826   CO2 29 02/28/2015 0826   GLUCOSE 200* 02/28/2015 0826   BUN 19 02/28/2015 0826   BUN 18.9 01/10/2015 0821   CREATININE 1.00 05/16/2015 0904   CREATININE 1.1 01/10/2015 0821   CREATININE 1.11 09/08/2013 0936   CALCIUM 9.3 02/28/2015 0826   PROT 6.8 02/28/2015 0826   ALBUMIN 4.1 02/28/2015 0826   AST 15 02/28/2015 0826   ALT 20 02/28/2015 0826   ALKPHOS 43 02/28/2015 0826   BILITOT 0.6 02/28/2015 0826   GFRNONAA >90 03/09/2014 0243   GFRAA >90 03/09/2014 0243     Radiographic Findings: Ct Chest W Contrast  05/16/2015  CLINICAL DATA:  Bilateral lung cancer diagnosed 2015 radiation therapy complete November 2016 EXAM: CT CHEST WITH CONTRAST TECHNIQUE: Multidetector CT imaging of the chest was performed during intravenous contrast administration. CONTRAST:  9m OMNIPAQUE IOHEXOL 300 MG/ML  SOLN COMPARISON:  FDG PET scan 01/22/2015, CT 01/10/2015. FINDINGS: Mediastinum/Nodes: No axillary supraclavicular lymphadenopathy. No mediastinal hilar  lymphadenopathy. No pericardial fluid. Lungs/Pleura: Curvilinear banding and pleural-parenchymal nodular thickening in the RIGHT upper lobe similar to comparison exam (image 25, series 5). More superiorly at site of hypermetabolic nodule described on comparison exam, there is thickening along the fissure measuring 2.1 by 2.0 cm (Image 22, series 5). The focal nodule seen on comparison exam is not clearly identified. Within the LEFT lower lobe, there is a band of mild consolidation and airspace disease with ground-glass opacity at site of a previous hypermetabolic nodule. No measurable nodularity. Findings are present image 45, series 5. Band of atelectasis in the lingula  is unchanged. No new pulmonary nodules. Upper abdomen: Limited view of the liver, kidneys, pancreas are unremarkable. Normal adrenal glands. Nodule of the LEFT adrenal gland measures 17 mm not changed from comparison exam at which time this lesion has mild metabolic activity similar to the liver. Musculoskeletal: No aggressive osseous lesion. IMPRESSION: 1. No measurable RIGHT upper lobe nodule and LEFT lower lobe nodule at stereotactic radio surgery sites. Nodular thickening in the RIGHT upper lobe is indeterminate but favored to represent post therapy change. Similarly ground-glass nodularity in the LEFT lower lobe favors radiation change. 2. No new pulmonary nodules 3. No mediastinal lymphadenopathy. 4. Stable enlarged LEFT adrenal nodule. 5. Patient would benefit from a restaging FDG PET scan at some point. Electronically Signed   By: Suzy Bouchard M.D.   On: 05/16/2015 09:50    Impression/Plan:  Doing well after SBRT to chest.  CT of chest indidate no evidence of recurrence or progression. Will keep an eye on evolving changes most c/w radiation change.  Will followup around [facility] May after a restaging PET scan.  _____________________________________   [name], MD

## 2015-05-31 ENCOUNTER — Other Ambulatory Visit: Payer: BLUE CROSS/BLUE SHIELD

## 2015-06-06 ENCOUNTER — Telehealth: Payer: Self-pay | Admitting: Family Medicine

## 2015-06-06 NOTE — Telephone Encounter (Signed)
Left a message for call back.  

## 2015-06-06 NOTE — Telephone Encounter (Signed)
Reason for call: pt called stating that he has had a lot of congestion in his head and chest x 1 week. He has been taking mucinex but it is not helping. Pt asking for OTC recommendations as he cannot come in for appt (he is traveling). Please call cell #.

## 2015-06-07 ENCOUNTER — Ambulatory Visit: Payer: BLUE CROSS/BLUE SHIELD | Admitting: Family Medicine

## 2015-06-07 MED ORDER — BENZONATATE 100 MG PO CAPS: 100.0000 mg | ORAL_CAPSULE | Freq: Three times a day (TID) | ORAL | Status: DC | PRN

## 2015-06-07 MED ORDER — AMOXICILLIN-POT CLAVULANATE 875-125 MG PO TABS: 1.0000 | ORAL_TABLET | Freq: Two times a day (BID) | ORAL | Status: DC

## 2015-06-07 NOTE — Telephone Encounter (Signed)
If he is coughing give him Tessalon Perles 100 mg 1 cap po tid prn cough, disp #40 and Augmentin 875 1 tab po bid x 7 days. Get care if worsens and take a probiotic and Mucinex with it. Confirm no amox or PCN allergy before prescribing

## 2015-06-07 NOTE — Telephone Encounter (Signed)
Sent in prescriptions to local pharmacy. Called the patient informed of instructions.

## 2015-06-19 ENCOUNTER — Institutional Professional Consult (permissible substitution): Payer: BLUE CROSS/BLUE SHIELD | Admitting: Internal Medicine

## 2015-06-24 ENCOUNTER — Other Ambulatory Visit: Payer: Self-pay | Admitting: Family Medicine

## 2015-06-25 ENCOUNTER — Telehealth: Payer: Self-pay | Admitting: Family Medicine

## 2015-06-25 MED ORDER — INSULIN LISPRO 100 UNIT/ML ~~LOC~~ SOLN: 20.0000 [IU] | Freq: Three times a day (TID) | SUBCUTANEOUS | Status: DC

## 2015-06-25 NOTE — Telephone Encounter (Signed)
Can be reached: [phone] Pharmacy: [id number] [place] Reason for call: Pt called pharmacy to refill novalog and it was $500. He believes he has to switch back to [employer] insurance to cover. Please send updated RX or notify pt of issues.

## 2015-06-25 NOTE — Telephone Encounter (Signed)
Updated medication list.  Sent in Humalog as instructed and patient informed

## 2015-06-25 NOTE — Telephone Encounter (Signed)
OK to d/c Novolog and restart Humalog with same sig as last rx of Novolog. Dis 30 day supply with 3 rf

## 2015-07-02 ENCOUNTER — Ambulatory Visit (HOSPITAL_BASED_OUTPATIENT_CLINIC_OR_DEPARTMENT_OTHER)
Admission: RE | Admit: 2015-07-02 | Discharge: 2015-07-02 | Disposition: A | Discharge status: Home / Self Care | Payer: BLUE CROSS/BLUE SHIELD | Source: Ambulatory Visit | Attending: Family | Admitting: Family

## 2015-07-02 ENCOUNTER — Ambulatory Visit (INDEPENDENT_AMBULATORY_CARE_PROVIDER_SITE_OTHER): Payer: BLUE CROSS/BLUE SHIELD | Admitting: Family

## 2015-07-02 ENCOUNTER — Telehealth: Payer: Self-pay | Admitting: Family

## 2015-07-02 ENCOUNTER — Encounter: Payer: Self-pay | Admitting: Family

## 2015-07-02 VITALS — BP 140/90 | HR 79 | Temp 97.7°F | Resp 18 | Ht 78.0 in | Wt 317.4 lb

## 2015-07-02 DIAGNOSIS — J209 Acute bronchitis, unspecified: Secondary | ICD-10-CM

## 2015-07-02 DIAGNOSIS — J189 Pneumonia, unspecified organism: Secondary | ICD-10-CM | POA: Insufficient documentation

## 2015-07-02 DIAGNOSIS — R059 Cough, unspecified: Secondary | ICD-10-CM

## 2015-07-02 DIAGNOSIS — R05 Cough: Secondary | ICD-10-CM

## 2015-07-02 MED ORDER — BENZONATATE 100 MG PO CAPS: 100.0000 mg | ORAL_CAPSULE | Freq: Three times a day (TID) | ORAL | Status: DC | PRN

## 2015-07-02 MED ORDER — AZITHROMYCIN 250 MG PO TABS: ORAL_TABLET | ORAL | Status: DC

## 2015-07-02 NOTE — Progress Notes (Signed)
Subjective:    Patient ID: Steven Ibarra, male    DOB: [birth year], 60 y.o.   MRN: [id number]  HPI  Mr. Luedke is a 60 yr old male with history of who presents today with history of metastatic basal cell carcinoma of the skin.  He now has bilateral lung metastases stage IV.  He presents today with chief complaint of nasal congestion.  Nasal congestion and chest congestion has been present x 1 week. He denies fever.  Using mucinex which helps with the congestion.  Wife and grandson have had respiratory illnesses.     Review of Systems See HPI  Past Medical History  Diagnosis Date  . Hyperlipidemia   . Obesity   . Overweight(278.02) 10/23/2009  . Mixed hyperlipidemia 10/23/2009  . ELEVATED BLOOD PRESSURE 04/23/2010  . DYSPNEA ON EXERTION 10/23/2009  . ALLERGIC RHINITIS, SEASONAL 10/23/2009  . OSA (obstructive sleep apnea)   . SLEEP APNEA, OBSTRUCTIVE 10/23/2009    Sleep study Done at [facility]  . Hearing loss   . Hypertension     Does not see a cardiologist, has not had a stress, echo   . GERD (gastroesophageal reflux disease)   . S/P radiation therapy 07/06/11 - 08/19/11    Right Facial and Right Neck Nodes and right Trigeminal  Nerve to Base of Skull/ Total Dose 6600 cGy/ 33 Fractions  . Preventative health care 09/30/2011  . Hypothyroid 01/28/2012  . Otitis externa of right ear 07/06/2012  . Pulled muscle 07/06/12  . Cancer ([place])   . Skin cancer     on nose, 9-10 yrs ago  . Basal cell carcinoma of neck 05/28/2011    r suprahyoid, radical neck dissection S/p 6 weeks of targeted radiation therapy   . Lung cancer ([place]) 03/08/14    invasive squamous cell carcinoma  . Diabetes mellitus approx 2003    type 2  . DIABETES MELLITUS, TYPE II 10/23/2009  . Type II or unspecified type diabetes mellitus with unspecified complication, uncontrolled     type 2     Social History   Social History  . Marital Status: Married    Spouse Name: N/A  . Number of Children: N/A  . Years of Education:  N/A   Occupational History  . Not on file.   Social History Main Topics  . Smoking status: Former Smoker -- 1.00 packs/day for 9 years    Types: Cigarettes    Quit date: 03/31/1975  . Smokeless tobacco: Never Used  . Alcohol Use: No     Comment: 4 beers a month, 06/12/11 rarely uses now  . Drug Use: No  . Sexual Activity: Yes   Other Topics Concern  . Not on file   Social History Narrative   Patient is married.   Patient with a history of smoking one pack per day for approximately 29 years from ages of [age]. Patient denies ever having used smokeless tobacco. Patient with rare use of alcohol.      Mother died at age 49 from congestive heart failure complications. Father died at the age of 54 secondary to a stroke.    Past Surgical History  Procedure Laterality Date  . Tonsillectomy and adenoidectomy    . Skin cancer removal    . Radical neck dissection  05/28/2011    Procedure: RADICAL NECK DISSECTION;  Surgeon: Ferris Fielden Montane, MD;  Location: [place];  Service: ENT;  Laterality: N/A;  Suprahyoid Neck Dissection  . Lung biopsy Right 03/08/2014  Family History  Problem Relation Age of Onset  . Other Mother     CHF  . Stroke Father   . Hyperlipidemia Father   . Heart disease Father     s/p valve replacement  . Hyperlipidemia Brother   . Obesity Brother   . Other Brother     Back pain  . Hyperlipidemia Brother   . Hypertension Brother   . Other Brother     Panic attacks  . Hyperlipidemia Brother   . Anesthesia problems Neg Hx     No Known Allergies  Current Outpatient Prescriptions on File Prior to Visit  Medication Sig Dispense Refill  . Ascorbic Acid (VITAMIN C) 1000 MG tablet Take 1,000 mg by mouth daily.    Marland Kitchen aspirin 81 MG tablet Take 81 mg by mouth daily. Reported on 05/29/2015    . B-D ULTRAFINE III SHORT PEN 31G X 8 MM MISC USE TWICE DAILY AS DIRECTED WITH LANTUS DX: 250.0 100 each 5  . B-D ULTRAFINE III SHORT PEN 31G X 8 MM MISC USE TWICE DAILY AS DIRECTED  WITH LANTUS DX: 250.0 100 each 5  . BAYER MICROLET LANCETS lancets Use astwice daily to check blood sugar.  DX E11.9 BAYER CONTOUR NEXT EASY PER INSURANCE 100 each 6  . Blood Glucose Monitoring Suppl (CONTOUR BLOOD GLUCOSE SYSTEM) DEVI Use twice daily to check blood sugar.  DX E11.9 1 Device 0  . cetirizine (ZYRTEC) 10 MG tablet Take 10 mg by mouth daily as needed for allergies.     . fenofibrate 160 MG tablet TAKE 1 TABLET BY MOUTH EVERY DAY.Marland Kitchen MAX OF 30 DAYS ON INSURANCE 90 tablet 1  . glucose blood (BAYER CONTOUR TEST) test strip Use twice daily to check blood sugar.  DX E11.9 BAYER CONTOUR NEXT EASY PER INSURANCE 100 each 6  . ibuprofen (ADVIL,MOTRIN) 200 MG tablet Take 600 mg by mouth every 6 (six) hours as needed for pain.     Marland Kitchen insulin lispro (HUMALOG) 100 UNIT/ML injection Inject 0.2 mLs (20 Units total) into the skin 3 (three) times daily before meals. 10 mL 4  . Krill Oil 300 MG CAPS Take 1 capsule by mouth daily.    Marland Kitchen LANTUS SOLOSTAR 100 UNIT/ML Solostar Pen INJECT 100 UNITS IN MORNING AND 60 UNITS IN EVENING 60 mL 2  . levothyroxine (SYNTHROID, LEVOTHROID) 75 MCG tablet TAKE 1 TABLET (75 MCG TOTAL) BY MOUTH DAILY. 30 tablet 1  . losartan (COZAAR) 50 MG tablet TAKE 1 TABLET (50 MG TOTAL) BY MOUTH DAILY. 30 tablet 5  . metFORMIN (GLUCOPHAGE) 500 MG tablet TAKE 2 TABLETS BY MOUTH 2 TIMES A DAY. 120 tablet 4  . Misc Natural Products (OSTEO BI-FLEX TRIPLE STRENGTH PO) Take 2 tablets by mouth daily.    . NON FORMULARY 1 each by Other route 4 (four) times daily. Accu-check multiclix lancets- use as directed    . Probiotic Product (PROBIOTIC DAILY PO) Take by mouth.    . ranitidine (ZANTAC) 300 MG tablet TAKE 1 TABLET (300 MG TOTAL) BY MOUTH AT BEDTIME. 30 tablet 3  . rosuvastatin (CRESTOR) 40 MG tablet Take 1 tablet (40 mg total) by mouth daily. 30 tablet 5  . Syringe, Disposable, 3 ML MISC DX: E11.9/E11.65  Use bid as directed 100 each 5   No current facility-administered medications on  file prior to visit.    BP 140/90 mmHg  Pulse 79  Temp(Src) 97.7 F (36.5 C) (Oral)  Resp 18  Ht '6\' 6"'$  (1.981 m)  Wt 317 lb 6.4 oz (143.972 kg)  BMI 36.69 kg/m2  SpO2 96%       Objective:   Physical Exam  Constitutional: He is oriented to person, place, and time. He appears well-developed and well-nourished. No distress.  HENT:  Head: Normocephalic and atraumatic.  Cardiovascular: Normal rate and regular rhythm.   No murmur heard. Pulmonary/Chest: Effort normal and breath sounds normal. No respiratory distress. He has no wheezes. He has no rales.  Musculoskeletal: He exhibits no edema.  Neurological: He is alert and oriented to person, place, and time.  Skin: Skin is warm and dry.  Psychiatric: He has a normal mood and affect. His behavior is normal. Thought content normal.          Assessment & Plan:  Bronchitis- CXR negative for infiltrate.  Shows postradiation pneumonitis RUL/LLL.  Will plan empiric rx for bronchitis with zpak.  Pt has tessalon as needed for cough.  He is advised to call if symptoms worsen or if symptoms do not improve in next few days.

## 2015-07-02 NOTE — Patient Instructions (Signed)
Please complete chest x ray on the first floor. Continue mucinex and tessalon as needed for cough/congestion.

## 2015-07-02 NOTE — Telephone Encounter (Signed)
CXR does not show definite PNA. I would like to treat him for bronchitis with zpak.  Follow up if symptoms worsen or fail to improve.

## 2015-07-02 NOTE — Progress Notes (Signed)
Pre visit review using our clinic review tool, if applicable. No additional management support is needed unless otherwise documented below in the visit note. 

## 2015-07-02 NOTE — Telephone Encounter (Signed)
Notified pt and he voices understanding. 

## 2015-07-08 ENCOUNTER — Other Ambulatory Visit: Payer: Self-pay | Admitting: Physician Assistant

## 2015-07-08 NOTE — Telephone Encounter (Signed)
Will defer further refills of patient's medications to PCP  

## 2015-08-04 ENCOUNTER — Other Ambulatory Visit: Payer: Self-pay | Admitting: Family Medicine

## 2015-08-15 ENCOUNTER — Ambulatory Visit (HOSPITAL_COMMUNITY)
Admission: RE | Admit: 2015-08-15 | Discharge: 2015-08-15 | Disposition: A | Discharge status: Home / Self Care | Payer: BLUE CROSS/BLUE SHIELD | Source: Ambulatory Visit | Attending: Radiation Oncology | Admitting: Radiation Oncology

## 2015-08-15 DIAGNOSIS — I2510 Atherosclerotic heart disease of native coronary artery without angina pectoris: Secondary | ICD-10-CM | POA: Diagnosis not present

## 2015-08-15 DIAGNOSIS — C7801 Secondary malignant neoplasm of right lung: Secondary | ICD-10-CM

## 2015-08-15 DIAGNOSIS — Z923 Personal history of irradiation: Secondary | ICD-10-CM | POA: Diagnosis not present

## 2015-08-15 DIAGNOSIS — K760 Fatty (change of) liver, not elsewhere classified: Secondary | ICD-10-CM | POA: Insufficient documentation

## 2015-08-15 DIAGNOSIS — E279 Disorder of adrenal gland, unspecified: Secondary | ICD-10-CM | POA: Diagnosis not present

## 2015-08-15 DIAGNOSIS — C7802 Secondary malignant neoplasm of left lung: Secondary | ICD-10-CM | POA: Insufficient documentation

## 2015-08-15 LAB — GLUCOSE, CAPILLARY: Glucose-Capillary: 199 mg/dL — ABNORMAL HIGH (ref 65–99)

## 2015-08-15 MED ORDER — FLUDEOXYGLUCOSE F - 18 (FDG) INJECTION
15.9000 | Freq: Once | INTRAVENOUS | Status: AC | PRN
  Administered 2015-08-15: 15.9 via INTRAVENOUS

## 2015-08-16 ENCOUNTER — Other Ambulatory Visit: Payer: Self-pay | Admitting: Radiation Oncology

## 2015-08-16 ENCOUNTER — Ambulatory Visit: Payer: BLUE CROSS/BLUE SHIELD | Admitting: Radiation Oncology

## 2015-08-19 ENCOUNTER — Ambulatory Visit: Payer: BLUE CROSS/BLUE SHIELD | Admitting: Radiation Oncology

## 2015-08-20 ENCOUNTER — Other Ambulatory Visit: Payer: Self-pay | Admitting: Family Medicine

## 2015-08-20 ENCOUNTER — Other Ambulatory Visit: Payer: Self-pay | Admitting: Radiation Oncology

## 2015-08-30 ENCOUNTER — Ambulatory Visit: Payer: BLUE CROSS/BLUE SHIELD | Admitting: Radiation Oncology

## 2015-09-03 ENCOUNTER — Telehealth: Payer: Self-pay | Admitting: Family Medicine

## 2015-09-03 ENCOUNTER — Other Ambulatory Visit: Payer: Self-pay | Admitting: Family Medicine

## 2015-09-03 MED ORDER — INSULIN LISPRO 100 UNIT/ML ~~LOC~~ SOLN: 20.0000 [IU] | Freq: Three times a day (TID) | SUBCUTANEOUS | Status: DC

## 2015-09-03 NOTE — Telephone Encounter (Signed)
Refill done.  

## 2015-09-03 NOTE — Telephone Encounter (Signed)
Can be reached: [phone] Pharmacy: [id number] - [place], [address]. [place] for call: pt states his insurance is requiring use of [facility] now and to have meds dispensed in 90 day supply. Pt has a few days of humalog left. Please send in new RX.

## 2015-09-04 ENCOUNTER — Encounter: Payer: Self-pay | Admitting: Radiation Oncology

## 2015-09-04 ENCOUNTER — Ambulatory Visit
Admission: RE | Admit: 2015-09-04 | Discharge: 2015-09-04 | Disposition: A | Discharge status: Home / Self Care | Payer: BLUE CROSS/BLUE SHIELD | Source: Ambulatory Visit | Attending: Radiation Oncology | Admitting: Radiation Oncology

## 2015-09-04 VITALS — BP 143/88 | HR 88 | Temp 97.8°F | Ht 78.0 in | Wt 316.7 lb

## 2015-09-04 DIAGNOSIS — C7801 Secondary malignant neoplasm of right lung: Secondary | ICD-10-CM | POA: Insufficient documentation

## 2015-09-04 DIAGNOSIS — C770 Secondary and unspecified malignant neoplasm of lymph nodes of head, face and neck: Secondary | ICD-10-CM | POA: Insufficient documentation

## 2015-09-04 DIAGNOSIS — C4441 Basal cell carcinoma of skin of scalp and neck: Secondary | ICD-10-CM | POA: Diagnosis not present

## 2015-09-04 DIAGNOSIS — Z794 Long term (current) use of insulin: Secondary | ICD-10-CM | POA: Insufficient documentation

## 2015-09-04 DIAGNOSIS — C7800 Secondary malignant neoplasm of unspecified lung: Secondary | ICD-10-CM | POA: Diagnosis present

## 2015-09-04 DIAGNOSIS — C7802 Secondary malignant neoplasm of left lung: Secondary | ICD-10-CM | POA: Diagnosis not present

## 2015-09-04 NOTE — Progress Notes (Signed)
Steven Ibarra presents for follow up of radiation completed 02/20/2015 to his Right Lung, superior upper lobe and Left Lung posterior lower lobe. He reports pain a 6/10 in his upper Left abdomen at his rib cage area. He believes it may have happened after golfing, and notes it has been improving slowly since it occurred. He reports some fatigue that has been present since radiation. He becomes tired after activities during the day and will rest in the afternoon at times. He does report shortness of breath with exertion which also makes him feel tired. He denies a cough, or nausea. He reports he is eating well.   BP 143/88 mmHg  Pulse 88  Temp(Src) 97.8 F (36.6 C)  Ht '6\' 6"'$  (1.981 m)  Wt 316 lb 11.2 oz (143.654 kg)  BMI 36.61 kg/m2  SpO2 95%   Wt Readings from Last 3 Encounters:  09/04/15 316 lb 11.2 oz (143.654 kg)  07/02/15 317 lb 6.4 oz (143.972 kg)  05/29/15 316 lb 11.2 oz (143.654 kg)

## 2015-09-06 ENCOUNTER — Telehealth: Payer: Self-pay | Admitting: *Deleted

## 2015-09-06 ENCOUNTER — Other Ambulatory Visit: Payer: Self-pay | Admitting: Radiation Oncology

## 2015-09-06 DIAGNOSIS — C7802 Secondary malignant neoplasm of left lung: Secondary | ICD-10-CM

## 2015-09-06 DIAGNOSIS — C7801 Secondary malignant neoplasm of right lung: Secondary | ICD-10-CM

## 2015-09-06 NOTE — Telephone Encounter (Signed)
CALLED PATIENT TO INFORM OF APPT. WITH DR. MANNAM ON 10-16-15- ARRIVAL TIME- 10:15 AM  '@[email or web address]'$  PULM. AND HIS CT ON 12-17-15 @ [facility] AND HIS FU WITH DR. Isidore Moos ON 12-18-15 FOR RESULTS, SPOKE WITH PATIENT AND HE IS AWARE OF THESE APPTS.

## 2015-09-06 NOTE — Telephone Encounter (Signed)
XXXX 

## 2015-09-06 NOTE — Progress Notes (Signed)
Radiation Oncology         [phone] ________________________________  Name: Steven Ibarra MRN: [id number]  Date: 09/04/2015  DOB: 05-10-1955  Follow-Up Visit Note  Outpatient  CC: Penni Homans, MD  Melissa Montane, MD  Diagnosis and Prior Radiotherapy:     ICD-9-CM ICD-10-CM   1. 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)  Prior Radiotherapy to H+N region: 66Gy in 33 fractions to the right facial nodes through the right neck and right trigeminal nerve to base of skull completed on 08-19-11  LUNG metastases history:  Radiation treatment dates:   05/23/2014, 05/25/2014, 05/28/2014, 05/30/2014, 06/01/2014 Site/dose:    1) Right upper lung mass / 54 Gy in 3 fractions 2) Left lower lung mass and adjacent nodules / 50 Gy in 5 fractions    Radiation treatment dates:   02/11/2015-02/20/2015 Site/dose:   1) Right lung,  superior upper lobe / 50 Gy in 5 fractions 2) Left lung, posterior lower lobe / 50 Gy in 5 fractions   Narrative:  The patient returns today for routine follow-up.  He reports pain a 6/10 in his upper Left abdomen at his rib cage area. He believes it may have happened after golfing, and notes it has been improving slowly since it occurred.  It occurred prior to his PET. He reports some fatigue that has been present since radiation. He becomes tired after activities during the day and will rest in the afternoon at times. He does report shortness of breath with exertion which also makes him feel tired. He denies a cough, fever, or nausea. He reports he is eating well.   ALLERGIES:  has No Known Allergies.  Meds: Current Outpatient Prescriptions  Medication Sig Dispense Refill  . Ascorbic Acid (VITAMIN C) 1000 MG tablet Take 1,000 mg by mouth daily.    .  B-D ULTRAFINE III SHORT PEN 31G X 8 MM MISC USE TWICE DAILY AS DIRECTED WITH LANTUS DX: 250.0 100 each 5  . B-D ULTRAFINE III SHORT PEN 31G X 8 MM MISC USE TWICE DAILY AS DIRECTED WITH LANTUS DX: 250.0 100 each 5  . BAYER MICROLET LANCETS lancets Use astwice daily to check blood sugar.  DX E11.9 BAYER CONTOUR NEXT EASY PER INSURANCE 100 each 6  . Blood Glucose Monitoring Suppl (CONTOUR BLOOD GLUCOSE SYSTEM) DEVI Use twice daily to check blood sugar.  DX E11.9 1 Device 0  . cetirizine (ZYRTEC) 10 MG tablet Take 10 mg by mouth daily as needed for allergies.     . fenofibrate 160 MG tablet TAKE 1 TABLET BY MOUTH EVERY DAY.Marland Kitchen MAX OF 30 DAYS ON INSURANCE 90 tablet 1  . glucose blood (BAYER CONTOUR TEST) test strip Use twice daily to check blood sugar.  DX E11.9 BAYER CONTOUR NEXT EASY PER INSURANCE 100 each 6  . ibuprofen (ADVIL,MOTRIN) 200 MG tablet Take 600 mg by mouth every 6 (six) hours as needed for pain.     Marland Kitchen insulin lispro (HUMALOG) 100 UNIT/ML injection Inject 0.2 mLs (20 Units total) into the skin 3 (three) times daily before meals. 30 mL 4  . Krill Oil 300 MG CAPS Take 1 capsule by mouth daily.    Marland Kitchen LANTUS SOLOSTAR 100 UNIT/ML Solostar Pen INJECT 100 UNITS IN MORNING AND 60 UNITS IN EVENING 60 mL 2  .  levothyroxine (SYNTHROID, LEVOTHROID) 75 MCG tablet TAKE 1 TABLET (75 MCG TOTAL) BY MOUTH DAILY BEFORE BREAKFAST. 30 tablet 2  . losartan (COZAAR) 50 MG tablet TAKE 1 TABLET (50 MG TOTAL) BY MOUTH DAILY. 30 tablet 5  . metFORMIN (GLUCOPHAGE) 500 MG tablet TAKE 2 TABLETS BY MOUTH 2 TIMES A DAY. 120 tablet 4  . Misc Natural Products (OSTEO BI-FLEX TRIPLE STRENGTH PO) Take 2 tablets by mouth daily.    . NON FORMULARY 1 each by Other route 4 (four) times daily. Accu-check multiclix lancets- use as directed    . Probiotic Product (PROBIOTIC DAILY PO) Take by mouth.    . ranitidine (ZANTAC) 300 MG tablet TAKE 1 TABLET (300 MG TOTAL) BY MOUTH AT BEDTIME. 30 tablet 3  . rosuvastatin (CRESTOR) 40 MG  tablet Take 1 tablet (40 mg total) by mouth daily. 30 tablet 5  . Syringe, Disposable, 3 ML MISC DX: E11.9/E11.65  Use bid as directed 100 each 5  . aspirin 81 MG tablet Take 81 mg by mouth daily. Reported on 09/04/2015    . benzonatate (TESSALON) 100 MG capsule Take 1 capsule (100 mg total) by mouth 3 (three) times daily as needed for cough. (Patient not taking: Reported on 09/04/2015) 40 capsule 0   No current facility-administered medications for this encounter.   Physical Findings: The patient is in no acute distress. Patient is alert and oriented.  height is '6\' 6"'$  (1.981 m) and weight is 316 lb 11.2 oz (143.654 kg). His temperature is 97.8 F (36.6 C). His blood pressure is 143/88 and his pulse is 88. His oxygen saturation is 95%. .    Lungs are clear to auscultation bilaterally.  Heart- regular rate and rhythm with no murmurs.  No palpable cervical, supraclavicular, or axillary adenopathy.   Oral cavity/oropharynx without lesions. Ambulatory. Skin intact, no lesions appreciated over neck/face MSK: tender to palpation in lower anterior left rib cage  ECOG 1  Lab Findings: Lab Results  Component Value Date   WBC 5.1 02/28/2015   HGB 15.2 02/28/2015   HCT 44.8 02/28/2015   MCV 88.1 02/28/2015   PLT 164.0 02/28/2015   Lab Results  Component Value Date   TSH 5.08* 02/28/2015   CMP     Component Value Date/Time   NA 139 02/28/2015 0826   K 4.3 02/28/2015 0826   CL 103 02/28/2015 0826   CO2 29 02/28/2015 0826   GLUCOSE 200* 02/28/2015 0826   BUN 19 02/28/2015 0826   BUN 18.9 01/10/2015 0821   CREATININE 1.00 05/16/2015 0904   CREATININE 1.1 01/10/2015 0821   CREATININE 1.11 09/08/2013 0936   CALCIUM 9.3 02/28/2015 0826   PROT 6.8 02/28/2015 0826   ALBUMIN 4.1 02/28/2015 0826   AST 15 02/28/2015 0826   ALT 20 02/28/2015 0826   ALKPHOS 43 02/28/2015 0826   BILITOT 0.6 02/28/2015 0826   GFRNONAA >90 03/09/2014 0243   GFRAA >90 03/09/2014 0243     Radiographic  Findings: Nm Pet Image Restag (ps) Skull Base To Thigh  08/15/2015  CLINICAL DATA:  Subsequent treatment strategy for right-sided lung cancer (metastatic) status post SBRT. Restaging examination. EXAM: NUCLEAR MEDICINE PET SKULL BASE TO THIGH TECHNIQUE: 15.9 mCi F-18 FDG was injected intravenously. Full-ring PET imaging was performed from the skull base to thigh after the radiotracer. CT data was obtained and used for attenuation correction and anatomic localization. FASTING BLOOD GLUCOSE:  Value: 199 mg/dl COMPARISON:  PET-CT 01/22/2015. FINDINGS: NECK No hypermetabolic lymph nodes in the neck.  CHEST The previously noted right upper lobe pulmonary nodule is difficult to clearly visualize as it appears part of an area of developing postradiation changes, but is estimated to measure approximately 2.3 x 2.4 cm (image 19 of series 6) and demonstrates hypermetabolism (SUVmax = 3.5). How much of this is related to residual disease versus involving postradiation changes is uncertain on today's examination. There are extensive areas of adjacent architectural distortion in the upper right lung, predominantly in the right upper lobe related to recent radiation therapy. Previously noted left lower lobe pulmonary nodule is no longer confidently identified, with bandlike areas of postradiation change in the periphery of the left lung base involving the inferior segment of the lingula and lateral basal segment of the left lower lobe. No new suspicious appearing pulmonary nodules or masses. No pleural effusions. No enlarged or hypermetabolic mediastinal or hilar lymph nodes. Heart size is normal. There is no significant pericardial fluid, thickening or pericardial calcification. There is atherosclerosis of the thoracic aorta, the great vessels of the mediastinum and the coronary arteries, including calcified atherosclerotic plaque in the left main, left anterior descending and left circumflex coronary arteries. Severe  calcifications of the mitral annulus. ABDOMEN/PELVIS No abnormal hypermetabolic activity within the liver, pancreas, right adrenal gland, or spleen. 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 No hypermetabolic lymph nodes in the abdomen or pelvis. Diffuse low attenuation throughout the hepatic parenchyma, compatible with hepatic steatosis. Exophytic low-attenuation lesion with no internal metabolic activity in the lower pole the right kidney measuring 4.4 cm in diameter is incompletely characterized on today's noncontrast CT examination, but is similar to prior studies, presumably a simple cyst. Mild atherosclerosis throughout the abdominal and pelvic vasculature, without evidence of aneurysm. No significant volume of ascites. No pneumoperitoneum. SKELETON No focal hypermetabolic activity to suggest skeletal metastasis. IMPRESSION: 1. Today's study demonstrates evolving postradiation changes in the lungs bilaterally. The previously noted left lower lobe pulmonary nodule has completely resolved. The previously noted right upper lobe pulmonary nodule appears slightly larger and continues to be hypermetabolic, however, whether or not this is related to residual disease or involving postradiation pneumonitis and developing mass-like fibrosis. 2. No findings to suggest new metastatic disease in the neck, abdomen or pelvis. 3. Small left adrenal nodule, similar to prior studies, presumably a lipid poor adenoma. 4. Hepatic steatosis. 5. Atherosclerosis including left main and 2 vessel coronary artery disease. Please note that although the presence of coronary artery calcium documents the presence of coronary artery disease, the severity of this disease and any potential stenosis cannot be  assessed on this non-gated CT examination. Assessment for potential risk factor modification, dietary therapy or pharmacologic therapy may be warranted, if clinically indicated. 6. Additional incidental findings, as above. Electronically Signed   By: Vinnie Langton M.D.   On: 08/15/2015 10:28    Impression/Plan:   Rib/chest wall pain:  I looked at the PET images - I see muscular/soft tissue SUV uptake.  Given his history, I favor his pain to be due to muscle strain/trauma.  The tissues were possibly more vulnerable to irritation given his pain SBRT in this region.  Since his symptoms are improving, we will observe for now.  I don't think steroids for inflammation are currently warranted. No obvious rib mets.  SOB with exertion - like related in part to radiation changes in the lungs, S/P bilateral lung SBRT.  Refer to pulmonology - with defer to them on PFTs (he did have baseline PFTs).  Disease status - cannot rule out active disease in the prior SBRT regions, but favor the changes to be fibrotic. No new areas of metastatic disease seen on PET.  We discussed the pros and cons of biopsy but I didn't recommend one.  He is comfortable with the plan to have a CT chest wo contrast in mid Sept.  I will f/u with him then.  He knows he is at high risk for eventual disease progression, but luckily his disease hasn't behaved as aggressively as other metastatic cancers can.  His QOL is relatively good.  _____________________________________   [name], MD

## 2015-09-10 ENCOUNTER — Other Ambulatory Visit (INDEPENDENT_AMBULATORY_CARE_PROVIDER_SITE_OTHER): Payer: BLUE CROSS/BLUE SHIELD

## 2015-09-10 DIAGNOSIS — I10 Essential (primary) hypertension: Secondary | ICD-10-CM

## 2015-09-10 DIAGNOSIS — G4733 Obstructive sleep apnea (adult) (pediatric): Secondary | ICD-10-CM | POA: Diagnosis not present

## 2015-09-10 DIAGNOSIS — E119 Type 2 diabetes mellitus without complications: Secondary | ICD-10-CM

## 2015-09-10 DIAGNOSIS — E782 Mixed hyperlipidemia: Secondary | ICD-10-CM

## 2015-09-10 DIAGNOSIS — H6091 Unspecified otitis externa, right ear: Secondary | ICD-10-CM

## 2015-09-10 DIAGNOSIS — E1169 Type 2 diabetes mellitus with other specified complication: Secondary | ICD-10-CM

## 2015-09-10 DIAGNOSIS — Z1159 Encounter for screening for other viral diseases: Secondary | ICD-10-CM

## 2015-09-10 DIAGNOSIS — E669 Obesity, unspecified: Secondary | ICD-10-CM

## 2015-09-10 LAB — MICROALBUMIN / CREATININE URINE RATIO
Creatinine,U: 205.8 mg/dL
Microalb Creat Ratio: 0.3 mg/g (ref 0.0–30.0)
Microalb, Ur: 0.7 mg/dL (ref 0.0–1.9)

## 2015-09-10 LAB — LDL CHOLESTEROL, DIRECT: LDL DIRECT: 57 mg/dL

## 2015-09-10 LAB — CBC
HCT: 42.2 % (ref 39.0–52.0)
Hemoglobin: 14.4 g/dL (ref 13.0–17.0)
MCHC: 34.2 g/dL (ref 30.0–36.0)
MCV: 88 fl (ref 78.0–100.0)
Platelets: 173 10*3/uL (ref 150.0–400.0)
RBC: 4.79 Mil/uL (ref 4.22–5.81)
RDW: 13.9 % (ref 11.5–15.5)
WBC: 4.6 10*3/uL (ref 4.0–10.5)

## 2015-09-10 LAB — LIPID PANEL
CHOL/HDL RATIO: 5
CHOLESTEROL: 117 mg/dL (ref 0–200)
HDL: 24.1 mg/dL — AB (ref >39.00)
NonHDL: 93.38
Triglycerides: 269 mg/dL — ABNORMAL HIGH (ref 0.0–149.0)
VLDL: 53.8 mg/dL — AB (ref 0.0–40.0)

## 2015-09-10 LAB — HEPATITIS C ANTIBODY: HCV Ab: NEGATIVE

## 2015-09-10 LAB — HEMOGLOBIN A1C: Hgb A1c MFr Bld: 7.7 % — ABNORMAL HIGH (ref 4.6–6.5)

## 2015-09-10 LAB — TSH: TSH: 5.21 u[IU]/mL — ABNORMAL HIGH (ref 0.35–4.50)

## 2015-09-11 LAB — HIV ANTIBODY (ROUTINE TESTING W REFLEX): HIV: NONREACTIVE

## 2015-09-13 ENCOUNTER — Other Ambulatory Visit: Payer: Self-pay | Admitting: Radiation Oncology

## 2015-09-13 ENCOUNTER — Telehealth: Payer: Self-pay

## 2015-09-13 DIAGNOSIS — C7800 Secondary malignant neoplasm of unspecified lung: Secondary | ICD-10-CM

## 2015-09-13 MED ORDER — METHYLPREDNISOLONE 4 MG PO TBPK: ORAL_TABLET | ORAL | Status: DC

## 2015-09-13 NOTE — Telephone Encounter (Signed)
I called and cancelled Steven Ibarra prescription for MethylPrednisolone (Medrol Dosepak) at [facility] in [place], and then called it into the [facility] in [place] per Steven Ibarra request. He is aware, and will check with the [facility] in [place] for his prescription.

## 2015-09-13 NOTE — Telephone Encounter (Signed)
Steven Ibarra called me today and reported continued increased pain on his Left side rib cage area. He reports that the pain has not decreased since his appointment with Dr. Isidore Moos, and has actually increased over the past 2 days. He has tried taking ibuprofen without any relief. I have notified Dr. Isidore Moos of his concerns.

## 2015-09-16 ENCOUNTER — Encounter: Payer: Self-pay | Admitting: Family Medicine

## 2015-09-16 ENCOUNTER — Ambulatory Visit (INDEPENDENT_AMBULATORY_CARE_PROVIDER_SITE_OTHER): Payer: BLUE CROSS/BLUE SHIELD | Admitting: Family Medicine

## 2015-09-16 ENCOUNTER — Telehealth: Payer: Self-pay | Admitting: Family Medicine

## 2015-09-16 VITALS — BP 118/80 | HR 78 | Temp 98.1°F | Ht 78.0 in | Wt 315.0 lb

## 2015-09-16 DIAGNOSIS — Z9989 Dependence on other enabling machines and devices: Secondary | ICD-10-CM

## 2015-09-16 DIAGNOSIS — E1169 Type 2 diabetes mellitus with other specified complication: Secondary | ICD-10-CM

## 2015-09-16 DIAGNOSIS — E119 Type 2 diabetes mellitus without complications: Secondary | ICD-10-CM | POA: Diagnosis not present

## 2015-09-16 DIAGNOSIS — E782 Mixed hyperlipidemia: Secondary | ICD-10-CM | POA: Diagnosis not present

## 2015-09-16 DIAGNOSIS — E663 Overweight: Secondary | ICD-10-CM

## 2015-09-16 DIAGNOSIS — M940 Chondrocostal junction syndrome [Tietze]: Secondary | ICD-10-CM

## 2015-09-16 DIAGNOSIS — E039 Hypothyroidism, unspecified: Secondary | ICD-10-CM

## 2015-09-16 DIAGNOSIS — I10 Essential (primary) hypertension: Secondary | ICD-10-CM

## 2015-09-16 DIAGNOSIS — E669 Obesity, unspecified: Secondary | ICD-10-CM

## 2015-09-16 DIAGNOSIS — R0602 Shortness of breath: Secondary | ICD-10-CM | POA: Insufficient documentation

## 2015-09-16 DIAGNOSIS — G4733 Obstructive sleep apnea (adult) (pediatric): Secondary | ICD-10-CM

## 2015-09-16 DIAGNOSIS — R0981 Nasal congestion: Secondary | ICD-10-CM

## 2015-09-16 HISTORY — DX: Shortness of breath: R06.02

## 2015-09-16 HISTORY — DX: Chondrocostal junction syndrome (tietze): M94.0

## 2015-09-16 MED ORDER — ALBUTEROL SULFATE HFA 108 (90 BASE) MCG/ACT IN AERS: 2.0000 | INHALATION_SPRAY | Freq: Four times a day (QID) | RESPIRATORY_TRACT | Status: DC | PRN

## 2015-09-16 MED ORDER — METFORMIN HCL 500 MG PO TABS: ORAL_TABLET | ORAL | Status: DC

## 2015-09-16 MED ORDER — LOSARTAN POTASSIUM 50 MG PO TABS: ORAL_TABLET | ORAL | Status: DC

## 2015-09-16 MED ORDER — ROSUVASTATIN CALCIUM 40 MG PO TABS: 40.0000 mg | ORAL_TABLET | Freq: Every day | ORAL | Status: DC

## 2015-09-16 MED ORDER — FENOFIBRATE 160 MG PO TABS: ORAL_TABLET | ORAL | Status: DC

## 2015-09-16 NOTE — Patient Instructions (Addendum)
Try Lidocaine patches to left chest wall to see if it helps  Call if worsens.   Basic Carbohydrate Counting for Diabetes Mellitus Carbohydrate counting is a method for keeping track of the amount of carbohydrates you eat. Eating carbohydrates naturally increases the level of sugar (glucose) in your blood, so it is important for you to know the amount that is okay for you to have in every meal. Carbohydrate counting helps keep the level of glucose in your blood within normal limits. The amount of carbohydrates allowed is different for every person. A dietitian can help you calculate the amount that is right for you. Once you know the amount of carbohydrates you can have, you can count the carbohydrates in the foods you want to eat. Carbohydrates are found in the following foods:  Grains, such as breads and cereals.  Dried beans and soy products.  Starchy vegetables, such as potatoes, peas, and corn.  Fruit and fruit juices.  Milk and yogurt.  Sweets and snack foods, such as cake, cookies, candy, chips, soft drinks, and fruit drinks. CARBOHYDRATE COUNTING There are two ways to count the carbohydrates in your food. You can use either of the methods or a combination of both. Reading the "Nutrition Facts" on [place] The "Nutrition Facts" is an area that is included on the labels of almost all packaged food and beverages in the [place]. It includes the serving size of that food or beverage and information about the nutrients in each serving of the food, including the grams (g) of carbohydrate per serving.  Decide the number of servings of this food or beverage that you will be able to eat or drink. Multiply that number of servings by the number of grams of carbohydrate that is listed on the label for that serving. The total will be the amount of carbohydrates you will be having when you eat or drink this food or beverage. Learning Standard Serving Sizes of Food When you eat food that  is not packaged or does not include "Nutrition Facts" on the label, you need to measure the servings in order to count the amount of carbohydrates.A serving of most carbohydrate-rich foods contains about 15 g of carbohydrates. The following list includes serving sizes of carbohydrate-rich foods that provide 15 g ofcarbohydrate per serving:   1 slice of bread (1 oz) or 1 six-inch tortilla.    of a hamburger bun or English muffin.  4-6 crackers.   cup unsweetened dry cereal.    cup hot cereal.   cup rice or pasta.    cup mashed potatoes or  of a large baked potato.  1 cup fresh fruit or one small piece of fruit.    cup canned or frozen fruit or fruit juice.  1 cup milk.   cup plain fat-free yogurt or yogurt sweetened with artificial sweeteners.   cup cooked dried beans or starchy vegetable, such as peas, corn, or potatoes.  Decide the number of standard-size servings that you will eat. Multiply that number of servings by 15 (the grams of carbohydrates in that serving). For example, if you eat 2 cups of strawberries, you will have eaten 2 servings and 30 g of carbohydrates (2 servings x 15 g = 30 g). For foods such as soups and casseroles, in which more than one food is mixed in, you will need to count the carbohydrates in each food that is included. EXAMPLE OF CARBOHYDRATE COUNTING Sample Dinner  3 oz chicken breast.   cup  of brown rice.   cup of corn.  1 cup milk.   1 cup strawberries with sugar-free whipped topping.  Carbohydrate Calculation Step 1: Identify the foods that contain carbohydrates:   Rice.   Corn.   Milk.   Strawberries. Step 2:Calculate the number of servings eaten of each:   2 servings of rice.   1 serving of corn.   1 serving of milk.   1 serving of strawberries. Step 3: Multiply each of those number of servings by 15 g:   2 servings of rice x 15 g = 30 g.   1 serving of corn x 15 g = 15 g.   1 serving of  milk x 15 g = 15 g.   1 serving of strawberries x 15 g = 15 g. Step 4: Add together all of the amounts to find the total grams of carbohydrates eaten: 30 g + 15 g + 15 g + 15 g = 75 g.   This information is not intended to replace advice given to you by your health care provider. Make sure you discuss any questions you have with your health care provider.   Document Released: 03/16/2005 Document Revised: 04/06/2014 Document Reviewed: 02/10/2013 Elsevier Interactive Patient Education [employer].

## 2015-09-16 NOTE — Assessment & Plan Note (Signed)
Mild with exertion. Given Albuterol prn to use til seen by pulmonary medicine

## 2015-09-16 NOTE — Assessment & Plan Note (Signed)
Encouraged heart healthy diet, increase exercise, avoid trans fats, consider a krill oil cap daily 

## 2015-09-16 NOTE — Assessment & Plan Note (Signed)
Left CW pain after radiation treatments encouraged Lidocaine patches. Is currently on Prednisone with minimal improvement

## 2015-09-16 NOTE — Telephone Encounter (Signed)
Relation to QA:ESLP Call back number:[phone]   Reason for call:  Patient checking on the status if  health form was received patient faxed to [phone]. Please advise

## 2015-09-16 NOTE — Assessment & Plan Note (Signed)
Well controlled, no changes to meds. Encouraged heart healthy diet such as the DASH diet and exercise as tolerated.  °

## 2015-09-16 NOTE — Assessment & Plan Note (Signed)
hgba1c unacceptable but improving, minimize simple carbs. Increase exercise as tolerated. Continue current meds, hgba1c is improved, will monitor and repeat in 3 months

## 2015-09-16 NOTE — Progress Notes (Signed)
Pre visit review using our clinic review tool, if applicable. No additional management support is needed unless otherwise documented below in the visit note. 

## 2015-09-16 NOTE — Assessment & Plan Note (Signed)
Uses CPAP consistently.

## 2015-09-16 NOTE — Progress Notes (Signed)
Patient ID: Steven Ibarra, male   DOB: 1956-01-09, 60 y.o.   MRN: [id number]   Subjective:    Patient ID: Steven Ibarra, male    DOB: 07-15-1955, 60 y.o.   MRN: [id number]  Chief Complaint  Patient presents with  . Follow-up    HPI Patient is in today for follow up. Is doing well today but has noted some left CW pain with movement even of right arm. Is on Prednisone for the pain without great improvement. No recent illness or acute concerns otherwise except for some mild SOB with exertion. Denies CP/palp/HA/congestion/fevers/GI or GU c/o. Taking meds as prescribed  Past Medical History  Diagnosis Date  . Hyperlipidemia   . Obesity   . Overweight(278.02) 10/23/2009  . Mixed hyperlipidemia 10/23/2009  . ELEVATED BLOOD PRESSURE 04/23/2010  . DYSPNEA ON EXERTION 10/23/2009  . ALLERGIC RHINITIS, SEASONAL 10/23/2009  . OSA (obstructive sleep apnea)   . SLEEP APNEA, OBSTRUCTIVE 10/23/2009    Sleep study Done at [facility]  . Hearing loss   . Hypertension     Does not see a cardiologist, has not had a stress, echo   . GERD (gastroesophageal reflux disease)   . S/P radiation therapy 07/06/11 - 08/19/11    Right Facial and Right Neck Nodes and right Trigeminal  Nerve to Base of Skull/ Total Dose 6600 cGy/ 33 Fractions  . Preventative health care 09/30/2011  . Hypothyroid 01/28/2012  . Otitis externa of right ear 07/06/2012  . Pulled muscle 07/06/12  . Cancer ([place])   . Skin cancer     on nose, 9-10 yrs ago  . Basal cell carcinoma of neck 05/28/2011    r suprahyoid, radical neck dissection S/p 6 weeks of targeted radiation therapy   . Lung cancer ([place]) 03/08/14    invasive squamous cell carcinoma  . Diabetes mellitus approx 2003    type 2  . DIABETES MELLITUS, TYPE II 10/23/2009  . Type II or unspecified type diabetes mellitus with unspecified complication, uncontrolled     type 2   . Costochondritis 09/16/2015  . SOB (shortness of breath) 09/16/2015    Past Surgical History  Procedure  Laterality Date  . Tonsillectomy and adenoidectomy    . Skin cancer removal    . Radical neck dissection  05/28/2011    Procedure: RADICAL NECK DISSECTION;  Surgeon: Melissa Montane, MD;  Location: [facility];  Service: ENT;  Laterality: N/A;  Suprahyoid Neck Dissection  . Lung biopsy Right 03/08/2014    Family History  Problem Relation Age of Onset  . Other Mother     CHF  . Stroke Father   . Hyperlipidemia Father   . Heart disease Father     s/p valve replacement  . Hyperlipidemia Brother   . Obesity Brother   . Other Brother     Back pain  . Hyperlipidemia Brother   . Hypertension Brother   . Other Brother     Panic attacks  . Hyperlipidemia Brother   . Anesthesia problems Neg Hx     Social History   Social History  . Marital Status: Married    Spouse Name: N/A  . Number of Children: N/A  . Years of Education: N/A   Occupational History  . Not on file.   Social History Main Topics  . Smoking status: Former Smoker -- 1.00 packs/day for 9 years    Types: Cigarettes    Quit date: 03/31/1975  . Smokeless tobacco: Never Used  . Alcohol  Use: No     Comment: 4 beers a month, 06/12/11 rarely uses now  . Drug Use: No  . Sexual Activity: Yes   Other Topics Concern  . Not on file   Social History Narrative   Patient is married.   Patient with a history of smoking one pack per day for approximately 29 years from ages of [age]. Patient denies ever having used smokeless tobacco. Patient with rare use of alcohol.      Mother died at age 80 from congestive heart failure complications. Father died at the age of 56 secondary to a stroke.    Outpatient Prescriptions Prior to Visit  Medication Sig Dispense Refill  . Ascorbic Acid (VITAMIN C) 1000 MG tablet Take 1,000 mg by mouth daily.    Marland Kitchen aspirin 81 MG tablet Take 81 mg by mouth daily. Reported on 09/04/2015    . B-D ULTRAFINE III SHORT PEN 31G X 8 MM MISC USE TWICE DAILY AS DIRECTED WITH LANTUS DX: 250.0 100 each 5  . B-D  ULTRAFINE III SHORT PEN 31G X 8 MM MISC USE TWICE DAILY AS DIRECTED WITH LANTUS DX: 250.0 100 each 5  . BAYER MICROLET LANCETS lancets Use astwice daily to check blood sugar.  DX E11.9 BAYER CONTOUR NEXT EASY PER INSURANCE 100 each 6  . Blood Glucose Monitoring Suppl (CONTOUR BLOOD GLUCOSE SYSTEM) DEVI Use twice daily to check blood sugar.  DX E11.9 1 Device 0  . cetirizine (ZYRTEC) 10 MG tablet Take 10 mg by mouth daily as needed for allergies.     Marland Kitchen glucose blood (BAYER CONTOUR TEST) test strip Use twice daily to check blood sugar.  DX E11.9 BAYER CONTOUR NEXT EASY PER INSURANCE 100 each 6  . ibuprofen (ADVIL,MOTRIN) 200 MG tablet Take 600 mg by mouth every 6 (six) hours as needed for pain.     Marland Kitchen insulin lispro (HUMALOG) 100 UNIT/ML injection Inject 0.2 mLs (20 Units total) into the skin 3 (three) times daily before meals. 30 mL 4  . Krill Oil 300 MG CAPS Take 1 capsule by mouth daily.    Marland Kitchen LANTUS SOLOSTAR 100 UNIT/ML Solostar Pen INJECT 100 UNITS IN MORNING AND 60 UNITS IN EVENING 60 mL 2  . levothyroxine (SYNTHROID, LEVOTHROID) 75 MCG tablet TAKE 1 TABLET (75 MCG TOTAL) BY MOUTH DAILY BEFORE BREAKFAST. 30 tablet 2  . methylPREDNISolone (MEDROL DOSEPAK) 4 MG TBPK tablet Use as directed for chest wall pain. 21 tablet 0  . Misc Natural Products (OSTEO BI-FLEX TRIPLE STRENGTH PO) Take 2 tablets by mouth daily.    . NON FORMULARY 1 each by Other route 4 (four) times daily. Accu-check multiclix lancets- use as directed    . Probiotic Product (PROBIOTIC DAILY PO) Take by mouth.    . ranitidine (ZANTAC) 300 MG tablet TAKE 1 TABLET (300 MG TOTAL) BY MOUTH AT BEDTIME. 30 tablet 3  . Syringe, Disposable, 3 ML MISC DX: E11.9/E11.65  Use bid as directed 100 each 5  . benzonatate (TESSALON) 100 MG capsule Take 1 capsule (100 mg total) by mouth 3 (three) times daily as needed for cough. 40 capsule 0  . fenofibrate 160 MG tablet TAKE 1 TABLET BY MOUTH EVERY DAY.Marland Kitchen MAX OF 30 DAYS ON INSURANCE 90 tablet 1  .  losartan (COZAAR) 50 MG tablet TAKE 1 TABLET (50 MG TOTAL) BY MOUTH DAILY. 30 tablet 5  . metFORMIN (GLUCOPHAGE) 500 MG tablet TAKE 2 TABLETS BY MOUTH 2 TIMES A DAY. 120 tablet 4  .  rosuvastatin (CRESTOR) 40 MG tablet Take 1 tablet (40 mg total) by mouth daily. 30 tablet 5   No facility-administered medications prior to visit.    No Known Allergies  Review of Systems  Constitutional: Positive for malaise/fatigue. Negative for fever.  HENT: Negative for congestion.   Eyes: Negative for blurred vision.  Respiratory: Positive for shortness of breath.   Cardiovascular: Positive for chest pain. Negative for palpitations and leg swelling.  Gastrointestinal: Negative for nausea, abdominal pain and blood in stool.  Genitourinary: Negative for dysuria and frequency.  Musculoskeletal: Negative for falls.  Skin: Negative for rash.  Neurological: Negative for dizziness, loss of consciousness and headaches.  Endo/Heme/Allergies: Negative for environmental allergies.  Psychiatric/Behavioral: Negative for depression. The patient is not nervous/anxious.        Objective:    Physical Exam  Constitutional: He is oriented to person, place, and time. He appears well-developed and well-nourished. No distress.  HENT:  Head: Normocephalic and atraumatic.  Nose: Nose normal.  Eyes: Right eye exhibits no discharge. Left eye exhibits no discharge.  Neck: Normal range of motion. Neck supple.  Cardiovascular: Normal rate and regular rhythm.   No murmur heard. Pulmonary/Chest: Effort normal and breath sounds normal.  Abdominal: Soft. Bowel sounds are normal. There is no tenderness.  Musculoskeletal: He exhibits no edema.  Neurological: He is alert and oriented to person, place, and time.  Skin: Skin is warm and dry.  Psychiatric: He has a normal mood and affect.  Nursing note and vitals reviewed.   BP 118/80 mmHg  Pulse 78  Temp(Src) 98.1 F (36.7 C) (Oral)  Ht '6\' 6"'  (1.981 m)  Wt 315 lb  (142.883 kg)  BMI 36.41 kg/m2  SpO2 95% Wt Readings from Last 3 Encounters:  09/16/15 315 lb (142.883 kg)  09/04/15 316 lb 11.2 oz (143.654 kg)  07/02/15 317 lb 6.4 oz (143.972 kg)     Lab Results  Component Value Date   WBC 4.6 09/10/2015   HGB 14.4 09/10/2015   HCT 42.2 09/10/2015   PLT 173.0 09/10/2015   GLUCOSE 200* 02/28/2015   CHOL 117 09/10/2015   TRIG 269.0* 09/10/2015   HDL 24.10* 09/10/2015   LDLDIRECT 57.0 09/10/2015   LDLCALC 18 09/08/2013   ALT 20 02/28/2015   AST 15 02/28/2015   NA 139 02/28/2015   K 4.3 02/28/2015   CL 103 02/28/2015   CREATININE 1.00 05/16/2015   BUN 19 02/28/2015   CO2 29 02/28/2015   TSH 5.21* 09/10/2015   PSA 1.46 09/25/2011   INR 0.95 03/08/2014   HGBA1C 7.7* 09/10/2015   MICROALBUR 0.7 09/10/2015    Lab Results  Component Value Date   TSH 5.21* 09/10/2015   Lab Results  Component Value Date   WBC 4.6 09/10/2015   HGB 14.4 09/10/2015   HCT 42.2 09/10/2015   MCV 88.0 09/10/2015   PLT 173.0 09/10/2015   Lab Results  Component Value Date   NA 139 02/28/2015   K 4.3 02/28/2015   CO2 29 02/28/2015   GLUCOSE 200* 02/28/2015   BUN 19 02/28/2015   CREATININE 1.00 05/16/2015   BILITOT 0.6 02/28/2015   ALKPHOS 43 02/28/2015   AST 15 02/28/2015   ALT 20 02/28/2015   PROT 6.8 02/28/2015   ALBUMIN 4.1 02/28/2015   CALCIUM 9.3 02/28/2015   ANIONGAP 12 03/09/2014   EGFR 70* 01/10/2015   GFR 66.01 02/28/2015   Lab Results  Component Value Date   CHOL 117 09/10/2015   Lab Results  Component Value Date   HDL 24.10* 09/10/2015   Lab Results  Component Value Date   LDLCALC 18 09/08/2013   Lab Results  Component Value Date   TRIG 269.0* 09/10/2015   Lab Results  Component Value Date   CHOLHDL 5 09/10/2015   Lab Results  Component Value Date   HGBA1C 7.7* 09/10/2015       Assessment & Plan:   Problem List Items Addressed This Visit    SOB (shortness of breath)    Mild with exertion. Given Albuterol prn to  use til seen by pulmonary medicine      Overweight    Encouraged DASH diet, decrease po intake and increase exercise as tolerated. Needs 7-8 hours of sleep nightly. Avoid trans fats, eat small, frequent meals every 4-5 hours with lean proteins, complex carbs and healthy fats. Minimize simple carbs      Relevant Orders   TSH   CBC   Comprehensive metabolic panel   Lipid panel   Hemoglobin A1c   OSA on CPAP    Uses CPAP consistently.       Relevant Orders   TSH   CBC   Comprehensive metabolic panel   Lipid panel   Hemoglobin A1c   Nasal congestion   Relevant Orders   TSH   CBC   Comprehensive metabolic panel   Lipid panel   Hemoglobin A1c   MIXED HYPERLIPIDEMIA    Encouraged heart healthy diet, increase exercise, avoid trans fats, consider a krill oil cap daily      Relevant Medications   fenofibrate 160 MG tablet   rosuvastatin (CRESTOR) 40 MG tablet   losartan (COZAAR) 50 MG tablet   Other Relevant Orders   TSH   CBC   Comprehensive metabolic panel   Lipid panel   Hemoglobin A1c   Hypothyroid    On Levothyroxine, continue to monitor      Relevant Orders   TSH   CBC   Comprehensive metabolic panel   Lipid panel   Hemoglobin A1c   Hypertension - Primary    Well controlled, no changes to meds. Encouraged heart healthy diet such as the DASH diet and exercise as tolerated.       Relevant Medications   fenofibrate 160 MG tablet   rosuvastatin (CRESTOR) 40 MG tablet   losartan (COZAAR) 50 MG tablet   Other Relevant Orders   TSH   CBC   Comprehensive metabolic panel   Lipid panel   Hemoglobin A1c   Diabetes mellitus type 2 in obese (HCC)    hgba1c unacceptable but improving, minimize simple carbs. Increase exercise as tolerated. Continue current meds, hgba1c is improved, will monitor and repeat in 3 months      Relevant Medications   rosuvastatin (CRESTOR) 40 MG tablet   metFORMIN (GLUCOPHAGE) 500 MG tablet   losartan (COZAAR) 50 MG tablet   Other  Relevant Orders   TSH   CBC   Comprehensive metabolic panel   Lipid panel   Hemoglobin A1c   Costochondritis    Left CW pain after radiation treatments encouraged Lidocaine patches. Is currently on Prednisone with minimal improvement         I have discontinued Mr. Jilek benzonatate. I am also having him start on albuterol. Additionally, I am having him maintain his NON FORMULARY, cetirizine, ibuprofen, Misc Natural Products (OSTEO BI-FLEX TRIPLE STRENGTH PO), vitamin C, aspirin, Krill Oil, Probiotic Product (PROBIOTIC DAILY PO), Syringe (Disposable), B-D ULTRAFINE III SHORT PEN, glucose blood, BAYER  MICROLET LANCETS, CONTOUR BLOOD GLUCOSE SYSTEM, B-D ULTRAFINE III SHORT PEN, ranitidine, LANTUS SOLOSTAR, levothyroxine, insulin lispro, methylPREDNISolone, fenofibrate, rosuvastatin, metFORMIN, and losartan.  Meds ordered this encounter  Medications  . fenofibrate 160 MG tablet    Sig: TAKE 1 TABLET BY MOUTH EVERY DAY.Marland Kitchen MAX OF 30 DAYS ON INSURANCE    Dispense:  90 tablet    Refill:  1  . rosuvastatin (CRESTOR) 40 MG tablet    Sig: Take 1 tablet (40 mg total) by mouth daily.    Dispense:  90 tablet    Refill:  1  . metFORMIN (GLUCOPHAGE) 500 MG tablet    Sig: TAKE 2 TABLETS BY MOUTH 2 TIMES A DAY.    Dispense:  360 tablet    Refill:  1  . losartan (COZAAR) 50 MG tablet    Sig: TAKE 1 TABLET (50 MG TOTAL) BY MOUTH DAILY.    Dispense:  90 tablet    Refill:  1  . albuterol (PROVENTIL HFA;VENTOLIN HFA) 108 (90 Base) MCG/ACT inhaler    Sig: Inhale 2 puffs into the lungs every 6 (six) hours as needed for wheezing or shortness of breath.    Dispense:  18 g    Refill:  2     Penni Homans, MD

## 2015-09-16 NOTE — Assessment & Plan Note (Signed)
On Levothyroxine, continue to monitor 

## 2015-09-16 NOTE — Assessment & Plan Note (Signed)
Encouraged DASH diet, decrease po intake and increase exercise as tolerated. Needs 7-8 hours of sleep nightly. Avoid trans fats, eat small, frequent meals every 4-5 hours with lean proteins, complex carbs and healthy fats. Minimize simple carbs 

## 2015-09-18 NOTE — Telephone Encounter (Signed)
Form received, completed at much as possible and placed in Dr. Charlett Blake red folder for review, completion and signature.

## 2015-09-27 ENCOUNTER — Telehealth: Payer: Self-pay | Admitting: Family Medicine

## 2015-09-27 MED ORDER — INSULIN GLARGINE 100 UNIT/ML SOLOSTAR PEN: PEN_INJECTOR | SUBCUTANEOUS | Status: DC

## 2015-09-27 NOTE — Telephone Encounter (Signed)
Refill request for LANTUS   Pharmacy: [place]

## 2015-09-30 ENCOUNTER — Telehealth: Payer: Self-pay | Admitting: Family Medicine

## 2015-09-30 MED ORDER — TRAMADOL HCL 50 MG PO TABS: 50.0000 mg | ORAL_TABLET | Freq: Two times a day (BID) | ORAL | Status: DC

## 2015-09-30 NOTE — Telephone Encounter (Signed)
Not sure what patch he is referring to. I did not give a prescription pain patch but he might be talking about OTC patches. I can prescribe some tramadol if he needs it but need to know where his pain is the worst and if anything he has historically taken has worked best I would be happy to consider that med as well. Tramadol 50 mg tabs 1 tab po bid prn pain, disp #60

## 2015-09-30 NOTE — Telephone Encounter (Signed)
°  Relationship to patient: Self  Can be reached: [phone]  Pharmacy:  [id number] - [place], [place]. Ruthe Mannan [phone] (Phone) [phone] (Fax)        Reason for call: Patient states he was advised to call if the pain patches he was prescribed did not work and he could get something else for pain.

## 2015-09-30 NOTE — Telephone Encounter (Signed)
The patient had tried some OTC pain patches for chest wall pain (discussed at his last OV) and he stated PCP said could try tramadol.  Was unable to sleep well last night due to pain and would like to try tramadol.   Printed/PCP signed/faxed to [facility] in [place].

## 2015-10-02 ENCOUNTER — Other Ambulatory Visit: Payer: Self-pay | Admitting: Family Medicine

## 2015-10-02 NOTE — Telephone Encounter (Signed)
Refills sent on 09/27/2015 #16m and 2RF. 90 day supply would be 464m

## 2015-10-02 NOTE — Telephone Encounter (Signed)
°  Relationship to patient: Self   Can be reached: [phone]   Pharmacy:  [id number] - [place], [place]. Ruthe Mannan [phone] (Phone) [phone] (Fax)        Reason for call: Request 90 day Rx for Insulin Glargine (LANTUS SOLOSTAR) 100 UNIT/ML Solostar Pen [[id number]]

## 2015-10-16 ENCOUNTER — Ambulatory Visit (INDEPENDENT_AMBULATORY_CARE_PROVIDER_SITE_OTHER): Payer: BLUE CROSS/BLUE SHIELD | Admitting: Pulmonary Disease

## 2015-10-16 ENCOUNTER — Encounter: Payer: Self-pay | Admitting: Pulmonary Disease

## 2015-10-16 VITALS — BP 142/84 | HR 81 | Ht 78.0 in | Wt 317.8 lb

## 2015-10-16 DIAGNOSIS — R0689 Other abnormalities of breathing: Secondary | ICD-10-CM | POA: Diagnosis not present

## 2015-10-16 DIAGNOSIS — R0600 Dyspnea, unspecified: Secondary | ICD-10-CM

## 2015-10-16 MED ORDER — MOMETASONE FUROATE 220 MCG/INH IN AEPB: 1.0000 | INHALATION_SPRAY | Freq: Two times a day (BID) | RESPIRATORY_TRACT | Status: DC

## 2015-10-16 NOTE — Progress Notes (Signed)
Patient seen in the office today and instructed on use of Asmanex.  Patient expressed understanding and demonstrated technique. Parke Poisson [facility] ([facility]) 10/16/2015

## 2015-10-16 NOTE — Progress Notes (Signed)
Subjective:    Patient ID: Steven Ibarra, male    DOB: 10/25/1955, 60 y.o.   MRN: [id number]  HPI Consult for evaluation of dyspnea.  Steven Ibarra is a 60 Y/O with H/O of Basal Cell Carcinoma of the skin with mets to the Submandibular Lymph Node, Right Neck and bilateral lung metastases. He underwent XRT to the lung in  early 2016 and then again in Nov 2016. Subsequent imaging shows ongoing evolution of post radiation changes in RUL and LLL.  Complains of increasing dyspnea on exertion for the past 2 months. He has daily symptoms of cough in the morning which is nonproductive in nature. He does not have symptoms of dyspnea at rest. No fevers, chills, hemoptysis. He tries to maintain an active lifestyle and plays golf on a regular basis.  DATA: PET scan 08/15/15. No clear evidence of metastatic disease.  Opacities, consolidation in RUL and LLL. Worse compared to prior CT scan CT scan 05/16/15. Nodular thickening, opacities in right upper lobe and left lower lobe.  PFTs 02/06/15 FVC 5.06 (80%) FEV1 2.76 (79%) F/F 74 TLC 92% DLCO 64%. Minimal obstructive disease, mild diffusion defect.  Social History: 5-pack-year smoking history. He quit in 1980. Occasional alcohol use no illegal drug use. He is self-employed. No relevant exposure history.  Family history: Brother-hyperlipidemia, hypertension, obesity. Father-stroke.  Past Medical History  Diagnosis Date  . Hyperlipidemia   . Obesity   . Overweight(278.02) 10/23/2009  . Mixed hyperlipidemia 10/23/2009  . ELEVATED BLOOD PRESSURE 04/23/2010  . DYSPNEA ON EXERTION 10/23/2009  . ALLERGIC RHINITIS, SEASONAL 10/23/2009  . OSA (obstructive sleep apnea)   . SLEEP APNEA, OBSTRUCTIVE 10/23/2009    Sleep study Done at [facility]  . Hearing loss   . Hypertension     Does not see a cardiologist, has not had a stress, echo   . GERD (gastroesophageal reflux disease)   . S/P radiation therapy 07/06/11 - 08/19/11    Right Facial and Right Neck Nodes  and right Trigeminal  Nerve to Base of Skull/ Total Dose 6600 cGy/ 33 Fractions  . Preventative health care 09/30/2011  . Hypothyroid 01/28/2012  . Otitis externa of right ear 07/06/2012  . Pulled muscle 07/06/12  . Cancer ([place])   . Skin cancer     on nose, 9-10 yrs ago  . Basal cell carcinoma of neck 05/28/2011    r suprahyoid, radical neck dissection S/p 6 weeks of targeted radiation therapy   . Lung cancer ([place]) 03/08/14    invasive squamous cell carcinoma  . Diabetes mellitus approx 2003    type 2  . DIABETES MELLITUS, TYPE II 10/23/2009  . Type II or unspecified type diabetes mellitus with unspecified complication, uncontrolled     type 2   . Costochondritis 09/16/2015  . SOB (shortness of breath) 09/16/2015    Current outpatient prescriptions:  .  albuterol (PROVENTIL HFA;VENTOLIN HFA) 108 (90 Base) MCG/ACT inhaler, Inhale 2 puffs into the lungs every 6 (six) hours as needed for wheezing or shortness of breath., Disp: 18 g, Rfl: 2 .  Ascorbic Acid (VITAMIN C) 1000 MG tablet, Take 1,000 mg by mouth daily., Disp: , Rfl:  .  B-D ULTRAFINE III SHORT PEN 31G X 8 MM MISC, USE TWICE DAILY AS DIRECTED WITH LANTUS DX: 250.0, Disp: 100 each, Rfl: 5 .  B-D ULTRAFINE III SHORT PEN 31G X 8 MM MISC, USE TWICE DAILY AS DIRECTED WITH LANTUS DX: 250.0, Disp: 100 each, Rfl: 5 .  BAYER  MICROLET LANCETS lancets, Use astwice daily to check blood sugar.  DX E11.9 BAYER CONTOUR NEXT EASY PER INSURANCE, Disp: 100 each, Rfl: 6 .  Blood Glucose Monitoring Suppl (CONTOUR BLOOD GLUCOSE SYSTEM) DEVI, Use twice daily to check blood sugar.  DX E11.9, Disp: 1 Device, Rfl: 0 .  cetirizine (ZYRTEC) 10 MG tablet, Take 10 mg by mouth daily as needed for allergies. , Disp: , Rfl:  .  fenofibrate 160 MG tablet, TAKE 1 TABLET BY MOUTH EVERY DAY.Marland Kitchen MAX OF 30 DAYS ON INSURANCE, Disp: 90 tablet, Rfl: 1 .  glucose blood (BAYER CONTOUR TEST) test strip, Use twice daily to check blood sugar.  DX E11.9 BAYER CONTOUR NEXT EASY PER  INSURANCE, Disp: 100 each, Rfl: 6 .  ibuprofen (ADVIL,MOTRIN) 200 MG tablet, Take 600 mg by mouth every 6 (six) hours as needed for pain. , Disp: , Rfl:  .  Insulin Glargine (LANTUS SOLOSTAR) 100 UNIT/ML Solostar Pen, INJECT 100 UNITS IN MORNING AND 60 UNITS IN EVENING, Disp: 60 mL, Rfl: 2 .  insulin lispro (HUMALOG) 100 UNIT/ML injection, Inject 0.2 mLs (20 Units total) into the skin 3 (three) times daily before meals., Disp: 30 mL, Rfl: 4 .  Krill Oil 300 MG CAPS, Take 1 capsule by mouth daily., Disp: , Rfl:  .  levothyroxine (SYNTHROID, LEVOTHROID) 75 MCG tablet, TAKE 1 TABLET (75 MCG TOTAL) BY MOUTH DAILY BEFORE BREAKFAST., Disp: 30 tablet, Rfl: 2 .  losartan (COZAAR) 50 MG tablet, TAKE 1 TABLET (50 MG TOTAL) BY MOUTH DAILY., Disp: 90 tablet, Rfl: 1 .  metFORMIN (GLUCOPHAGE) 500 MG tablet, TAKE 2 TABLETS BY MOUTH 2 TIMES A DAY., Disp: 360 tablet, Rfl: 1 .  Misc Natural Products (OSTEO BI-FLEX TRIPLE STRENGTH PO), Take 2 tablets by mouth daily., Disp: , Rfl:  .  NON FORMULARY, 1 each by Other route 4 (four) times daily. Accu-check multiclix lancets- use as directed, Disp: , Rfl:  .  Probiotic Product (PROBIOTIC DAILY PO), Take by mouth., Disp: , Rfl:  .  ranitidine (ZANTAC) 300 MG tablet, TAKE 1 TABLET (300 MG TOTAL) BY MOUTH AT BEDTIME., Disp: 30 tablet, Rfl: 3 .  rosuvastatin (CRESTOR) 40 MG tablet, Take 1 tablet (40 mg total) by mouth daily., Disp: 90 tablet, Rfl: 1 .  Syringe, Disposable, 3 ML MISC, DX: E11.9/E11.65  Use bid as directed, Disp: 100 each, Rfl: 5 .  traMADol (ULTRAM) 50 MG tablet, Take 1 tablet (50 mg total) by mouth 2 (two) times daily., Disp: 60 tablet, Rfl: 0  Review of Systems Dyspnea on exertion, cough, sputum production. No wheezing, hemoptysis. No fevers, chills. No chest pain, palpitation. No nausea, vomiting, diarrhea, constipation. All other review of systems negative.    Objective:   Physical Exam Blood pressure 142/84, pulse 81, height '6\' 6"'$  (1.981 m), weight  317 lb 12.8 oz (144.153 kg), SpO2 96 %. Gen: No apparent distress Neuro: No gross focal deficits. HEENT: No JVD, lymphadenopathy, thyromegaly. RS: Clear, No wheeze or crackles CVS: S1-S2 heard, no murmurs rubs gallops. Abdomen: Soft, positive bowel sounds. Musculoskeletal: No edema.    Assessment & Plan:  Dyspnea on exertion.  This may be related to evolving postradiation changes. I suspect there may be a component of obesity, deconditioning that is contributing to his symptoms. His PFTs were reviewed did not show any overt obstructive process suggestive of COPD, asthma.  I'll try him on asmanex inhaler. He'll continue his albuterol rescue inhaler before exercise and during episodes of dyspnea. If his follow-up CT  shows worsening obstruction we may consider a course of PO steroids. However I not sure if this will be effective in this late fibrotic stage of disease.  Plan: Start asmanex  Marshell Garfinkel MD [facility] Pager [phone] If no answer or after 3pm call: [phone] 10/16/2015, 11:33 AM

## 2015-10-16 NOTE — Patient Instructions (Signed)
We will start you on asmanex inahler 1 puff, twice daily. Continue using the albuterol inhaler.  Return in 3 months

## 2015-11-01 ENCOUNTER — Other Ambulatory Visit: Payer: Self-pay

## 2015-11-01 MED ORDER — ROSUVASTATIN CALCIUM 40 MG PO TABS: 40.0000 mg | ORAL_TABLET | Freq: Every day | ORAL | 1 refills | Status: DC

## 2015-11-05 ENCOUNTER — Other Ambulatory Visit: Payer: Self-pay | Admitting: Family Medicine

## 2015-11-05 MED ORDER — RANITIDINE HCL 300 MG PO TABS: ORAL_TABLET | ORAL | 1 refills | Status: DC

## 2015-11-07 ENCOUNTER — Telehealth: Payer: Self-pay | Admitting: Family Medicine

## 2015-11-07 MED ORDER — GLUCOSE BLOOD VI STRP: ORAL_STRIP | 6 refills | Status: DC

## 2015-11-07 NOTE — Telephone Encounter (Signed)
Sent in strips as instructed.

## 2015-11-07 NOTE — Telephone Encounter (Signed)
°  Pharmacy:  [facility] [place], [place]. [place] Mannan [phone] (Phone) [phone] (Fax)     Reason for call: Request refill on glucose blood (BAYER CONTOUR TEST) test strip [[id number]]. Need updated Rx

## 2015-11-12 ENCOUNTER — Encounter: Payer: Self-pay | Admitting: Medical

## 2015-11-12 ENCOUNTER — Ambulatory Visit (INDEPENDENT_AMBULATORY_CARE_PROVIDER_SITE_OTHER): Payer: BLUE CROSS/BLUE SHIELD | Admitting: Medical

## 2015-11-12 VITALS — BP 158/92 | HR 94 | Temp 97.7°F | Ht 78.0 in | Wt 318.8 lb

## 2015-11-12 DIAGNOSIS — E669 Obesity, unspecified: Secondary | ICD-10-CM

## 2015-11-12 DIAGNOSIS — L089 Local infection of the skin and subcutaneous tissue, unspecified: Secondary | ICD-10-CM | POA: Diagnosis not present

## 2015-11-12 DIAGNOSIS — L989 Disorder of the skin and subcutaneous tissue, unspecified: Secondary | ICD-10-CM

## 2015-11-12 DIAGNOSIS — E119 Type 2 diabetes mellitus without complications: Secondary | ICD-10-CM

## 2015-11-12 DIAGNOSIS — E1169 Type 2 diabetes mellitus with other specified complication: Secondary | ICD-10-CM

## 2015-11-12 MED ORDER — DOXYCYCLINE HYCLATE 100 MG PO TABS: 100.0000 mg | ORAL_TABLET | Freq: Two times a day (BID) | ORAL | 0 refills | Status: DC

## 2015-11-12 MED ORDER — MUPIROCIN 2 % EX OINT: TOPICAL_OINTMENT | CUTANEOUS | 0 refills | Status: DC

## 2015-11-12 NOTE — Progress Notes (Signed)
Subjective:    Patient ID: Steven Ibarra, male    DOB: 1955/08/06, 60 y.o.   MRN: [id number]  HPI   Pt has 2 wounds to his rt lower ext over past 9 days. He states guitar case land scraped his leg. And second time accidentally scraped his leg against his grandson small desk. Areas were a little wider. He cleaned area and placed some polysporin otc about twice a day. Pt last a1-c 2 months ago was 7.7. No fever, no chills, or sweats. No yellow dc from wound. Some pinkness and redness was formed earlier and that is less prominent.  Pt sugar this am was 180.    Review of Systems  Constitutional: Negative for chills, fatigue and fever.  Respiratory: Negative for cough, choking and wheezing.   Cardiovascular: Negative for chest pain and palpitations.  Gastrointestinal: Negative for abdominal pain.  Skin:       See hpi.  Neurological: Negative for dizziness and light-headedness. Syncope:    Hematological: Negative for adenopathy. Does not bruise/bleed easily.  Psychiatric/Behavioral: Negative for behavioral problems and confusion.    Past Medical History:  Diagnosis Date  . ALLERGIC RHINITIS, SEASONAL 10/23/2009  . Basal cell carcinoma of neck 05/28/2011   r suprahyoid, radical neck dissection S/p 6 weeks of targeted radiation therapy   . Cancer ([place])   . Costochondritis 09/16/2015  . Diabetes mellitus approx 2003   type 2  . DIABETES MELLITUS, TYPE II 10/23/2009  . DYSPNEA ON EXERTION 10/23/2009  . ELEVATED BLOOD PRESSURE 04/23/2010  . GERD (gastroesophageal reflux disease)   . Hearing loss   . Hyperlipidemia   . Hypertension    Does not see a cardiologist, has not had a stress, echo   . Hypothyroid 01/28/2012  . Lung cancer ([place]) 03/08/14   invasive squamous cell carcinoma  . Mixed hyperlipidemia 10/23/2009  . Obesity   . OSA (obstructive sleep apnea)   . Otitis externa of right ear 07/06/2012  . Overweight(278.02) 10/23/2009  . Preventative health care 09/30/2011  . Pulled  muscle 07/06/12  . S/P radiation therapy 07/06/11 - 08/19/11   Right Facial and Right Neck Nodes and right Trigeminal  Nerve to Base of Skull/ Total Dose 6600 cGy/ 33 Fractions  . Skin cancer    on nose, 9-10 yrs ago  . SLEEP APNEA, OBSTRUCTIVE 10/23/2009   [facility] at [facility]  . SOB (shortness of breath) 09/16/2015  . Type II or unspecified type diabetes mellitus with unspecified complication, uncontrolled    type 2      Social History   Social History  . Marital status: Married    Spouse name: N/A  . Number of children: N/A  . Years of education: N/A   Occupational History  . Not on file.   Social History Main Topics  . Smoking status: Former Smoker    Packs/day: 1.00    Years: 9.00    Types: Cigarettes    Quit date: 03/31/1975  . Smokeless tobacco: Never Used  . Alcohol use No     Comment: 4 beers a month, 06/12/11 rarely uses now  . Drug use: No  . Sexual activity: Yes   Other Topics Concern  . Not on file   Social History Narrative   Patient is married.   Patient with a history of smoking one pack per day for approximately 29 years from ages of [age]. Patient denies ever having used smokeless tobacco. Patient with rare use of alcohol.  Mother died at age 58 from congestive heart failure complications. Father died at the age of 49 secondary to a stroke.    Past Surgical History:  Procedure Laterality Date  . LUNG BIOPSY Right 03/08/2014  . RADICAL NECK DISSECTION  05/28/2011   Procedure: RADICAL NECK DISSECTION;  Surgeon: Melissa Montane, MD;  Location: [facility];  Service: ENT;  Laterality: N/A;  Suprahyoid Neck Dissection  . skin cancer removal    . TONSILLECTOMY AND ADENOIDECTOMY      Family History  Problem Relation Age of Onset  . Other Mother     CHF  . Stroke Father   . Hyperlipidemia Father   . Heart disease Father     s/p valve replacement  . Hyperlipidemia Brother   . Obesity Brother   . Other Brother     Back pain  . Hyperlipidemia Brother     . Hypertension Brother   . Other Brother     Panic attacks  . Hyperlipidemia Brother   . Anesthesia problems Neg Hx     No Known Allergies  Current Outpatient Prescriptions on File Prior to Visit  Medication Sig Dispense Refill  . albuterol (PROVENTIL HFA;VENTOLIN HFA) 108 (90 Base) MCG/ACT inhaler Inhale 2 puffs into the lungs every 6 (six) hours as needed for wheezing or shortness of breath. 18 g 2  . Ascorbic Acid (VITAMIN C) 1000 MG tablet Take 1,000 mg by mouth daily.    . B-D ULTRAFINE III SHORT PEN 31G X 8 MM MISC USE TWICE DAILY AS DIRECTED WITH LANTUS DX: 250.0 100 each 5  . B-D ULTRAFINE III SHORT PEN 31G X 8 MM MISC USE TWICE DAILY AS DIRECTED WITH LANTUS DX: 250.0 100 each 5  . BAYER MICROLET LANCETS lancets Use astwice daily to check blood sugar.  DX E11.9 BAYER CONTOUR NEXT EASY PER INSURANCE 100 each 6  . Blood Glucose Monitoring Suppl (CONTOUR BLOOD GLUCOSE SYSTEM) DEVI Use twice daily to check blood sugar.  DX E11.9 1 Device 0  . cetirizine (ZYRTEC) 10 MG tablet Take 10 mg by mouth daily as needed for allergies.     . fenofibrate 160 MG tablet TAKE 1 TABLET BY MOUTH EVERY DAY.Marland Kitchen MAX OF 30 DAYS ON INSURANCE 90 tablet 1  . glucose blood (BAYER CONTOUR TEST) test strip Use twice daily to check blood sugar.  DX E11.9 BAYER CONTOUR NEXT EASY PER INSURANCE 100 each 6  . ibuprofen (ADVIL,MOTRIN) 200 MG tablet Take 600 mg by mouth every 6 (six) hours as needed for pain.     . Insulin Glargine (LANTUS SOLOSTAR) 100 UNIT/ML Solostar Pen INJECT 100 UNITS IN MORNING AND 60 UNITS IN EVENING 60 mL 2  . insulin lispro (HUMALOG) 100 UNIT/ML injection Inject 0.2 mLs (20 Units total) into the skin 3 (three) times daily before meals. 30 mL 4  . Krill Oil 300 MG CAPS Take 1 capsule by mouth daily.    Marland Kitchen levothyroxine (SYNTHROID, LEVOTHROID) 75 MCG tablet TAKE 1 TABLET (75 MCG TOTAL) BY MOUTH DAILY BEFORE BREAKFAST. 30 tablet 2  . losartan (COZAAR) 50 MG tablet TAKE 1 TABLET (50 MG TOTAL) BY  MOUTH DAILY. 90 tablet 1  . metFORMIN (GLUCOPHAGE) 500 MG tablet TAKE 2 TABLETS BY MOUTH 2 TIMES A DAY. 360 tablet 1  . Misc Natural Products (OSTEO BI-FLEX TRIPLE STRENGTH PO) Take 2 tablets by mouth daily.    . mometasone (ASMANEX 60 METERED DOSES) 220 MCG/INH inhaler Inhale 1 puff into the lungs 2 (two) times  daily. 1 Inhaler 5  . NON FORMULARY 1 each by Other route 4 (four) times daily. Accu-check multiclix lancets- use as directed    . Probiotic Product (PROBIOTIC DAILY PO) Take by mouth.    . ranitidine (ZANTAC) 300 MG tablet TAKE 1 TABLET (300 MG TOTAL) BY MOUTH AT BEDTIME. 90 tablet 1  . rosuvastatin (CRESTOR) 40 MG tablet Take 1 tablet (40 mg total) by mouth daily. 90 tablet 1  . Syringe, Disposable, 3 ML MISC DX: E11.9/E11.65  Use bid as directed 100 each 5  . traMADol (ULTRAM) 50 MG tablet Take 1 tablet (50 mg total) by mouth 2 (two) times daily. (Patient not taking: Reported on 11/12/2015) 60 tablet 0   No current facility-administered medications on file prior to visit.     BP (!) 158/92 Comment: Pt has not taken BP meds this am  Pulse 94   Temp 97.7 F (36.5 C) (Oral)   Ht '6\' 6"'$  (1.981 m)   Wt (!) 318 lb 12.8 oz (144.6 kg)   SpO2 98%   BMI 36.84 kg/m       Objective:   Physical Exam    General- No acute distress.     Rt lower ext- mid and lower 1/3 anterior tibia 2 approximate 9 mm shallow break down in skin. No dc present. Between these areas pink color to skin. Slight tender to palpation.       Assessment & Plan:  Your sores on leg area very shallow and not very wide. Already some incremental improvement by your report. I do think you have area of infection between the sores.   Will rx mupirocin topical antibiotic to use twice daily.   Will rx doxycycline oral antibiotic as well.(rx advisement given).  We need to follow this area closely and make sure improving since you are diabetic. If ulcers are prolonged then may need unaboot placement and wound  care referral.   We sent out wound culture and will follow results.  Please follow up Friday morning or as needed  Derhonda Eastlick, [place], [place]

## 2015-11-12 NOTE — Progress Notes (Signed)
Pre visit review using our clinic tool,if applicable. No additional management support is needed unless otherwise documented below in the visit note.  

## 2015-11-12 NOTE — Patient Instructions (Addendum)
Your sores on leg area very shallow and not very wide. Already some incremental improvement by your report. I do think you have area of infection between the sores.   Will rx mupirocin topical antibiotic to use twice daily.   Will rx doxycycline oral antibiotic as well.(rx advisement given).  We need to follow this area closely and make sure improving since you are diabetic. If ulcers are prolonged then may need unaboot placement and wound care referral.   We sent out wound culture and will follow results.  Eat healthy, take diabetic meds and try to keep sugar down.  Please follow up Friday morning or as needed

## 2015-11-14 LAB — WOUND CULTURE
GRAM STAIN: NONE SEEN
GRAM STAIN: NONE SEEN
Gram Stain: NONE SEEN
Organism ID, Bacteria: NO GROWTH

## 2015-11-15 ENCOUNTER — Encounter: Payer: Self-pay | Admitting: Medical

## 2015-11-15 ENCOUNTER — Ambulatory Visit (INDEPENDENT_AMBULATORY_CARE_PROVIDER_SITE_OTHER): Payer: BLUE CROSS/BLUE SHIELD | Admitting: Medical

## 2015-11-15 VITALS — BP 150/90 | HR 92 | Temp 98.1°F | Ht 78.0 in | Wt 318.0 lb

## 2015-11-15 DIAGNOSIS — L089 Local infection of the skin and subcutaneous tissue, unspecified: Secondary | ICD-10-CM | POA: Diagnosis not present

## 2015-11-15 NOTE — Patient Instructions (Addendum)
Continue mupirocin topical ointment and doxycycline as before. Cover area as before as well. Pat wash and dry area. No scrubbing. I expect diameter of both wounds to gradually decrease and wounds to fill in. If not by next Wednesday am please let me know and would make referral to wound care for probable una boot treatment.(Will save you trip/follow up here next week). But please give [place] update on how area is. Try to eat low sugar diet. Continue diabetic meds as well.

## 2015-11-15 NOTE — Progress Notes (Signed)
Pre visit review using our clinic tool,if applicable. No additional management support is needed unless otherwise documented below in the visit note.  

## 2015-11-15 NOTE — Progress Notes (Signed)
Subjective:    Patient ID: Steven Ibarra, male    DOB: July 27, 1955, 60 y.o.   MRN: [id number]  HPI  Pt in for follow up. Pt has no fever, no chills or sweats. Pt states proximal wound filled in now with small scab and no discharge. Distal wound had formed a scab but he knocked off scab with wash cloth yesterday.  Pt sugar was 200 this am. Yesterday was 170-180.  Cultures so far showed no growth.     Review of Systems  Constitutional: Negative for chills, fatigue and fever.  Respiratory: Negative for cough, shortness of breath and wheezing.   Skin: Negative for rash.       See hpi and physical.  Hematological: Negative for adenopathy. Does not bruise/bleed easily.       Objective:   Physical Exam   General- No acute distress. Pleasant patient.  Rt lower ext- mid and lower 1/3 anterior tibia 2  Previous approximate 9 mm shallow break down in skin. Proximal wound looks to have filled in with small scab. The distal area looks filled in some but knocked off scab last night.(may 1-2 mm wider)  No dc present. Between these areas  no loner pink color to skin. No tenderness to palpation.  Past Medical History:  Diagnosis Date  . ALLERGIC RHINITIS, SEASONAL 10/23/2009  . Basal cell carcinoma of neck 05/28/2011   r suprahyoid, radical neck dissection S/p 6 weeks of targeted radiation therapy   . Cancer ([place])   . Costochondritis 09/16/2015  . Diabetes mellitus approx 2003   type 2  . DIABETES MELLITUS, TYPE II 10/23/2009  . DYSPNEA ON EXERTION 10/23/2009  . ELEVATED BLOOD PRESSURE 04/23/2010  . GERD (gastroesophageal reflux disease)   . Hearing loss   . Hyperlipidemia   . Hypertension    Does not see a cardiologist, has not had a stress, echo   . Hypothyroid 01/28/2012  . Lung cancer ([place]) 03/08/14   invasive squamous cell carcinoma  . Mixed hyperlipidemia 10/23/2009  . Obesity   . OSA (obstructive sleep apnea)   . Otitis externa of right ear 07/06/2012  . Overweight(278.02)  10/23/2009  . Preventative health care 09/30/2011  . Pulled muscle 07/06/12  . S/P radiation therapy 07/06/11 - 08/19/11   Right Facial and Right Neck Nodes and right Trigeminal  Nerve to Base of Skull/ Total Dose 6600 cGy/ 33 Fractions  . Skin cancer    on nose, 9-10 yrs ago  . SLEEP APNEA, OBSTRUCTIVE 10/23/2009   [facility] Done [facility]  . SOB (shortness of breath) 09/16/2015  . Type II or unspecified type diabetes mellitus with unspecified complication, uncontrolled    type 2      Social History   Social History  . Marital status: Married    Spouse name: N/A  . Number of children: N/A  . Years of education: N/A   Occupational History  . Not on file.   Social History Main Topics  . Smoking status: Former Smoker    Packs/day: 1.00    Years: 9.00    Types: Cigarettes    Quit date: 03/31/1975  . Smokeless tobacco: Never Used  . Alcohol use No     Comment: 4 beers a month, 06/12/11 rarely uses now  . Drug use: No  . Sexual activity: Yes   Other Topics Concern  . Not on file   Social History Narrative   Patient is married.   Patient with a history of  smoking one pack per day for approximately 29 years from ages of [age]. Patient denies ever having used smokeless tobacco. Patient with rare use of alcohol.      Mother died at age 45 from congestive heart failure complications. Father died at the age of 33 secondary to a stroke.    Past Surgical History:  Procedure Laterality Date  . LUNG BIOPSY Right 03/08/2014  . RADICAL NECK DISSECTION  05/28/2011   Procedure: RADICAL NECK DISSECTION;  Surgeon: Melissa Montane, MD;  Location: [facility];  Service: ENT;  Laterality: N/A;  Suprahyoid Neck Dissection  . skin cancer removal    . TONSILLECTOMY AND ADENOIDECTOMY      Family History  Problem Relation Age of Onset  . Other Mother     CHF  . Stroke Father   . Hyperlipidemia Father   . Heart disease Father     s/p valve replacement  . Hyperlipidemia Brother   . Obesity Brother   .  Other Brother     Back pain  . Hyperlipidemia Brother   . Hypertension Brother   . Other Brother     Panic attacks  . Hyperlipidemia Brother   . Anesthesia problems Neg Hx     No Known Allergies  Current Outpatient Prescriptions on File Prior to Visit  Medication Sig Dispense Refill  . albuterol (PROVENTIL HFA;VENTOLIN HFA) 108 (90 Base) MCG/ACT inhaler Inhale 2 puffs into the lungs every 6 (six) hours as needed for wheezing or shortness of breath. 18 g 2  . Ascorbic Acid (VITAMIN C) 1000 MG tablet Take 1,000 mg by mouth daily.    . B-D ULTRAFINE III SHORT PEN 31G X 8 MM MISC USE TWICE DAILY AS DIRECTED WITH LANTUS DX: 250.0 100 each 5  . B-D ULTRAFINE III SHORT PEN 31G X 8 MM MISC USE TWICE DAILY AS DIRECTED WITH LANTUS DX: 250.0 100 each 5  . BAYER MICROLET LANCETS lancets Use astwice daily to check blood sugar.  DX E11.9 BAYER CONTOUR NEXT EASY PER INSURANCE 100 each 6  . Blood Glucose Monitoring Suppl (CONTOUR BLOOD GLUCOSE SYSTEM) DEVI Use twice daily to check blood sugar.  DX E11.9 1 Device 0  . cetirizine (ZYRTEC) 10 MG tablet Take 10 mg by mouth daily as needed for allergies.     Marland Kitchen doxycycline (VIBRA-TABS) 100 MG tablet Take 1 tablet (100 mg total) by mouth 2 (two) times daily. Can give generic or capsules 20 tablet 0  . fenofibrate 160 MG tablet TAKE 1 TABLET BY MOUTH EVERY DAY.Marland Kitchen MAX OF 30 DAYS ON INSURANCE 90 tablet 1  . glucose blood (BAYER CONTOUR TEST) test strip Use twice daily to check blood sugar.  DX E11.9 BAYER CONTOUR NEXT EASY PER INSURANCE 100 each 6  . ibuprofen (ADVIL,MOTRIN) 200 MG tablet Take 600 mg by mouth every 6 (six) hours as needed for pain.     . Insulin Glargine (LANTUS SOLOSTAR) 100 UNIT/ML Solostar Pen INJECT 100 UNITS IN MORNING AND 60 UNITS IN EVENING 60 mL 2  . insulin lispro (HUMALOG) 100 UNIT/ML injection Inject 0.2 mLs (20 Units total) into the skin 3 (three) times daily before meals. 30 mL 4  . Krill Oil 300 MG CAPS Take 1 capsule by mouth daily.     Marland Kitchen levothyroxine (SYNTHROID, LEVOTHROID) 75 MCG tablet TAKE 1 TABLET (75 MCG TOTAL) BY MOUTH DAILY BEFORE BREAKFAST. 30 tablet 2  . losartan (COZAAR) 50 MG tablet TAKE 1 TABLET (50 MG TOTAL) BY MOUTH DAILY. 90 tablet  1  . metFORMIN (GLUCOPHAGE) 500 MG tablet TAKE 2 TABLETS BY MOUTH 2 TIMES A DAY. 360 tablet 1  . Misc Natural Products (OSTEO BI-FLEX TRIPLE STRENGTH PO) Take 2 tablets by mouth daily.    . mometasone (ASMANEX 60 METERED DOSES) 220 MCG/INH inhaler Inhale 1 puff into the lungs 2 (two) times daily. 1 Inhaler 5  . mupirocin ointment (BACTROBAN) 2 % Apply to area twice a day 22 g 0  . NON FORMULARY 1 each by Other route 4 (four) times daily. Accu-check multiclix lancets- use as directed    . Probiotic Product (PROBIOTIC DAILY PO) Take by mouth.    . ranitidine (ZANTAC) 300 MG tablet TAKE 1 TABLET (300 MG TOTAL) BY MOUTH AT BEDTIME. 90 tablet 1  . rosuvastatin (CRESTOR) 40 MG tablet Take 1 tablet (40 mg total) by mouth daily. 90 tablet 1  . Syringe, Disposable, 3 ML MISC DX: E11.9/E11.65  Use bid as directed 100 each 5  . traMADol (ULTRAM) 50 MG tablet Take 1 tablet (50 mg total) by mouth 2 (two) times daily. 60 tablet 0   No current facility-administered medications on file prior to visit.     BP (!) 150/90   Pulse 92   Temp 98.1 F (36.7 C) (Oral)   Ht '6\' 6"'$  (1.981 m)   Wt (!) 318 lb (144.2 kg)   SpO2 95%   BMI 36.75 kg/m       Assessment & Plan:  Continue mupirocin topical ointment and doxycycline as before. Cover area as before as well. Pat wash and dry area. No scrubbing. I expect diameter of both wounds to gradually decrease and wounds to fill in. If not by next Wednesday am please let me know and would make referral to wound care for probable una boot treatment.(Will save you trip/follow up here next week). But please give [place] update on how area is. Try to eat low sugar diet. Continue diabetic meds as well.

## 2015-11-16 ENCOUNTER — Emergency Department (HOSPITAL_COMMUNITY): Payer: BLUE CROSS/BLUE SHIELD

## 2015-11-16 ENCOUNTER — Encounter (HOSPITAL_COMMUNITY): Payer: Self-pay

## 2015-11-16 ENCOUNTER — Inpatient Hospital Stay (HOSPITAL_COMMUNITY)
Admission: EM | Admit: 2015-11-16 | Discharge: 2015-11-20 | DRG: 187 | Disposition: A | Discharge status: Home / Self Care | Payer: BLUE CROSS/BLUE SHIELD | Attending: Thoracic Surgery (Cardiothoracic Vascular Surgery) | Admitting: Thoracic Surgery (Cardiothoracic Vascular Surgery)

## 2015-11-16 DIAGNOSIS — Z85828 Personal history of other malignant neoplasm of skin: Secondary | ICD-10-CM

## 2015-11-16 DIAGNOSIS — Z823 Family history of stroke: Secondary | ICD-10-CM

## 2015-11-16 DIAGNOSIS — J948 Other specified pleural conditions: Principal | ICD-10-CM

## 2015-11-16 DIAGNOSIS — Z79899 Other long term (current) drug therapy: Secondary | ICD-10-CM

## 2015-11-16 DIAGNOSIS — E1165 Type 2 diabetes mellitus with hyperglycemia: Secondary | ICD-10-CM | POA: Diagnosis present

## 2015-11-16 DIAGNOSIS — K219 Gastro-esophageal reflux disease without esophagitis: Secondary | ICD-10-CM | POA: Diagnosis present

## 2015-11-16 DIAGNOSIS — Z794 Long term (current) use of insulin: Secondary | ICD-10-CM

## 2015-11-16 DIAGNOSIS — Z923 Personal history of irradiation: Secondary | ICD-10-CM

## 2015-11-16 DIAGNOSIS — I10 Essential (primary) hypertension: Secondary | ICD-10-CM | POA: Diagnosis present

## 2015-11-16 DIAGNOSIS — G4733 Obstructive sleep apnea (adult) (pediatric): Secondary | ICD-10-CM | POA: Diagnosis present

## 2015-11-16 DIAGNOSIS — J939 Pneumothorax, unspecified: Secondary | ICD-10-CM

## 2015-11-16 DIAGNOSIS — C7802 Secondary malignant neoplasm of left lung: Secondary | ICD-10-CM | POA: Diagnosis present

## 2015-11-16 DIAGNOSIS — Z87891 Personal history of nicotine dependence: Secondary | ICD-10-CM

## 2015-11-16 DIAGNOSIS — E782 Mixed hyperlipidemia: Secondary | ICD-10-CM | POA: Diagnosis present

## 2015-11-16 DIAGNOSIS — C7801 Secondary malignant neoplasm of right lung: Secondary | ICD-10-CM | POA: Diagnosis present

## 2015-11-16 DIAGNOSIS — H9190 Unspecified hearing loss, unspecified ear: Secondary | ICD-10-CM | POA: Diagnosis present

## 2015-11-16 DIAGNOSIS — R140 Abdominal distension (gaseous): Secondary | ICD-10-CM

## 2015-11-16 DIAGNOSIS — E039 Hypothyroidism, unspecified: Secondary | ICD-10-CM | POA: Diagnosis present

## 2015-11-16 DIAGNOSIS — E669 Obesity, unspecified: Secondary | ICD-10-CM | POA: Diagnosis present

## 2015-11-16 LAB — CBC WITH DIFFERENTIAL/PLATELET
Basophils Absolute: 0 10*3/uL (ref 0.0–0.1)
Basophils Relative: 0 %
EOS ABS: 0.2 10*3/uL (ref 0.0–0.7)
Eosinophils Relative: 2 %
HEMATOCRIT: 41.8 % (ref 39.0–52.0)
HEMOGLOBIN: 14.8 g/dL (ref 13.0–17.0)
LYMPHS ABS: 0.8 10*3/uL (ref 0.7–4.0)
Lymphocytes Relative: 11 %
MCH: 30.5 pg (ref 26.0–34.0)
MCHC: 35.4 g/dL (ref 30.0–36.0)
MCV: 86 fL (ref 78.0–100.0)
MONOS PCT: 11 %
Monocytes Absolute: 0.8 10*3/uL (ref 0.1–1.0)
NEUTROS ABS: 5.6 10*3/uL (ref 1.7–7.7)
NEUTROS PCT: 76 %
Platelets: 196 10*3/uL (ref 150–400)
RBC: 4.86 MIL/uL (ref 4.22–5.81)
RDW: 13.4 % (ref 11.5–15.5)
WBC: 7.4 10*3/uL (ref 4.0–10.5)

## 2015-11-16 LAB — BASIC METABOLIC PANEL
Anion gap: 7 (ref 5–15)
BUN: 19 mg/dL (ref 6–20)
CHLORIDE: 104 mmol/L (ref 101–111)
CO2: 28 mmol/L (ref 22–32)
CREATININE: 1.26 mg/dL — AB (ref 0.61–1.24)
Calcium: 9.2 mg/dL (ref 8.9–10.3)
GFR calc Af Amer: >60 mL/min (ref >60)
GFR calc non Af Amer: >60 mL/min (ref >60)
GLUCOSE: 246 mg/dL — AB (ref 65–99)
Potassium: 3.8 mmol/L (ref 3.5–5.1)
Sodium: 139 mmol/L (ref 135–145)

## 2015-11-16 MED ORDER — LIDOCAINE-EPINEPHRINE 1 %-1:100000 IJ SOLN: 20.0000 mL | Freq: Once | INTRAMUSCULAR | Status: DC

## 2015-11-16 MED ORDER — MIDAZOLAM HCL 2 MG/2ML IJ SOLN
2.0000 mg | Freq: Once | INTRAMUSCULAR | Status: AC
  Administered 2015-11-17: 2 mg via INTRAVENOUS
  Filled 2015-11-16: qty 2

## 2015-11-16 MED ORDER — LIDOCAINE-EPINEPHRINE 2 %-1:100000 IJ SOLN
INTRAMUSCULAR | Status: AC
  Administered 2015-11-17: 1 mL
  Filled 2015-11-16: qty 1

## 2015-11-16 MED ORDER — MORPHINE SULFATE (PF) 2 MG/ML IV SOLN
2.0000 mg | Freq: Once | INTRAVENOUS | Status: AC
  Administered 2015-11-17: 2 mg via INTRAVENOUS
  Filled 2015-11-16: qty 1

## 2015-11-16 MED ORDER — ALBUTEROL SULFATE (2.5 MG/3ML) 0.083% IN NEBU
5.0000 mg | INHALATION_SOLUTION | Freq: Once | RESPIRATORY_TRACT | Status: AC
  Administered 2015-11-16: 5 mg via RESPIRATORY_TRACT
  Filled 2015-11-16: qty 6

## 2015-11-16 NOTE — ED Notes (Signed)
Pt placed on NRP per MD order, pt speaking in complete sentences, NAD. Pain 7/10, family at bedside.

## 2015-11-16 NOTE — ED Triage Notes (Signed)
Patient c/o dry cough and SOB that began Friday and has become worse today.  Patient states that has some left sided chest pain when he takes deep breaths.  Patient states that has been taking his albuterol inhaler, tussinex, and advil since Friday.  Patient breathing even and unlabored.  NAD at this time.  Patient rates pain 8/10.

## 2015-11-16 NOTE — ED Notes (Signed)
Patient transported to X-ray 

## 2015-11-16 NOTE — ED Provider Notes (Signed)
[place] DEPT Provider Note   CSN: [id number] Arrival date & time: 11/16/15  2100     History   Chief Complaint Chief Complaint  Patient presents with  . Shortness of Breath    HPI Steven Ibarra is a 60 y.o. male.  The history is provided by the patient.  Shortness of Breath  This is a new problem. The problem occurs continuously.The current episode started yesterday. The problem has not changed since onset.Pertinent negatives include no fever, no headaches, no rhinorrhea, no cough, no sputum production, no chest pain and no abdominal pain. Risk factors: h/o lung cancer on radiation. He has tried nothing for the symptoms.    Past Medical History:  Diagnosis Date  . ALLERGIC RHINITIS, SEASONAL 10/23/2009  . Basal cell carcinoma of neck 05/28/2011   r suprahyoid, radical neck dissection S/p 6 weeks of targeted radiation therapy   . Cancer ([place])   . Costochondritis 09/16/2015  . Diabetes mellitus approx 2003   type 2  . DIABETES MELLITUS, TYPE II 10/23/2009  . DYSPNEA ON EXERTION 10/23/2009  . ELEVATED BLOOD PRESSURE 04/23/2010  . GERD (gastroesophageal reflux disease)   . Hearing loss   . Hyperlipidemia   . Hypertension    Does not see a cardiologist, has not had a stress, echo   . Hypothyroid 01/28/2012  . Lung cancer ([place]) 03/08/14   invasive squamous cell carcinoma  . Mixed hyperlipidemia 10/23/2009  . Obesity   . OSA (obstructive sleep apnea)   . Otitis externa of right ear 07/06/2012  . Overweight(278.02) 10/23/2009  . Preventative health care 09/30/2011  . Pulled muscle 07/06/12  . S/P radiation therapy 07/06/11 - 08/19/11   Right Facial and Right Neck Nodes and right Trigeminal  Nerve to Base of Skull/ Total Dose 6600 cGy/ 33 Fractions  . Skin cancer    on nose, 9-10 yrs ago  . SLEEP APNEA, OBSTRUCTIVE 10/23/2009   Sleep study Done at [facility]  . SOB (shortness of breath) 09/16/2015  . Type II or unspecified type diabetes mellitus with unspecified complication,  uncontrolled    type 2     Patient Active Problem List   Diagnosis Date Noted  . Costochondritis 09/16/2015  . SOB (shortness of breath) 09/16/2015  . Lung metastases ([place]) 04/04/2014  . Fatigue 03/16/2014  . Nasal congestion 03/16/2014  . Pulmonary hemorrhage 03/08/2014  . Acute respiratory failure with hypoxemia ([place]) 03/08/2014  . Lung nodules   . Otitis externa of right ear 07/06/2012  . Hypothyroid 01/28/2012  . Preventative health care 09/30/2011  . Skin cancer   . Hypertension   . GERD (gastroesophageal reflux disease)   . Diabetes mellitus type 2 in obese ([place])   . Basal cell carcinoma of neck 05/28/2011  . MIXED HYPERLIPIDEMIA 10/23/2009  . Overweight 10/23/2009  . OSA on CPAP 10/23/2009  . ALLERGIC RHINITIS, SEASONAL 10/23/2009  . DYSPNEA ON EXERTION 10/23/2009    Past Surgical History:  Procedure Laterality Date  . LUNG BIOPSY Right 03/08/2014  . RADICAL NECK DISSECTION  05/28/2011   Procedure: RADICAL NECK DISSECTION;  Surgeon: Melissa Montane, MD;  Location: [place];  Service: ENT;  Laterality: N/A;  Suprahyoid Neck Dissection  . skin cancer removal    . TONSILLECTOMY AND ADENOIDECTOMY         Home Medications    Prior to Admission medications   Medication Sig Start Date End Date Taking? Authorizing Provider  albuterol (PROVENTIL HFA;VENTOLIN HFA) 108 (90 Base) MCG/ACT inhaler Inhale 2 puffs  into the lungs every 6 (six) hours as needed for wheezing or shortness of breath. 09/16/15  Yes Mosie Lukes, MD  Ascorbic Acid (VITAMIN C) 1000 MG tablet Take 1,000 mg by mouth daily.   Yes Historical Provider, MD  cetirizine (ZYRTEC) 10 MG tablet Take 10 mg by mouth daily as needed for allergies.    Yes Historical Provider, MD  doxycycline (VIBRA-TABS) 100 MG tablet Take 1 tablet (100 mg total) by mouth 2 (two) times daily. Can give generic or capsules 11/12/15  Yes Edward Saguier, PA-C  fenofibrate 160 MG tablet TAKE 1 TABLET BY MOUTH EVERY DAY.Marland Kitchen MAX OF 30 DAYS ON  INSURANCE 09/16/15  Yes Mosie Lukes, MD  ibuprofen (ADVIL,MOTRIN) 200 MG tablet Take 600 mg by mouth every 6 (six) hours as needed for pain.    Yes Historical Provider, MD  Insulin Glargine (LANTUS SOLOSTAR) 100 UNIT/ML Solostar Pen INJECT 100 UNITS IN MORNING AND 60 UNITS IN EVENING 09/27/15  Yes Mosie Lukes, MD  insulin lispro (HUMALOG) 100 UNIT/ML injection Inject 0.2 mLs (20 Units total) into the skin 3 (three) times daily before meals. 09/03/15  Yes Mosie Lukes, MD  Krill Oil 300 MG CAPS Take 1 capsule by mouth daily.   Yes Historical Provider, MD  levothyroxine (SYNTHROID, LEVOTHROID) 75 MCG tablet TAKE 1 TABLET (75 MCG TOTAL) BY MOUTH DAILY BEFORE BREAKFAST. 09/03/15  Yes Mosie Lukes, MD  losartan (COZAAR) 50 MG tablet TAKE 1 TABLET (50 MG TOTAL) BY MOUTH DAILY. 09/16/15  Yes Mosie Lukes, MD  metFORMIN (GLUCOPHAGE) 500 MG tablet TAKE 2 TABLETS BY MOUTH 2 TIMES A DAY. 09/16/15  Yes Mosie Lukes, MD  Misc Natural Products (OSTEO BI-FLEX TRIPLE STRENGTH PO) Take 2 tablets by mouth daily.   Yes Historical Provider, MD  mometasone (ASMANEX 60 METERED DOSES) 220 MCG/INH inhaler Inhale 1 puff into the lungs 2 (two) times daily. 10/16/15  Yes Praveen Mannam, MD  mupirocin ointment (BACTROBAN) 2 % Apply to area twice a day 11/12/15  Yes Edward Saguier, PA-C  Probiotic Product (PROBIOTIC DAILY PO) Take by mouth.   Yes Historical Provider, MD  ranitidine (ZANTAC) 300 MG tablet TAKE 1 TABLET (300 MG TOTAL) BY MOUTH AT BEDTIME. 11/05/15  Yes Mosie Lukes, MD  rosuvastatin (CRESTOR) 40 MG tablet Take 1 tablet (40 mg total) by mouth daily. 11/01/15  Yes Mosie Lukes, MD  traMADol (ULTRAM) 50 MG tablet Take 1 tablet (50 mg total) by mouth 2 (two) times daily. 09/30/15  Yes Mosie Lukes, MD  B-D ULTRAFINE III SHORT PEN 31G X 8 MM MISC USE TWICE DAILY AS DIRECTED WITH LANTUS DX: 250.0 02/20/15   Mosie Lukes, MD  B-D ULTRAFINE III SHORT PEN 31G X 8 MM MISC USE TWICE DAILY AS DIRECTED WITH LANTUS DX:  250.0 04/14/15   Mosie Lukes, MD  BAYER MICROLET LANCETS lancets Use astwice daily to check blood sugar.  DX E11.9 BAYER CONTOUR NEXT EASY PER INSURANCE 03/19/15   Mosie Lukes, MD  Blood Glucose Monitoring Suppl (CONTOUR BLOOD GLUCOSE SYSTEM) DEVI Use twice daily to check blood sugar.  DX E11.9 03/19/15   Mosie Lukes, MD  glucose blood (BAYER CONTOUR TEST) test strip Use twice daily to check blood sugar.  DX E11.9 BAYER CONTOUR NEXT EASY PER INSURANCE 03/19/15   Mosie Lukes, MD  NON FORMULARY 1 each by Other route 4 (four) times daily. Accu-check multiclix lancets- use as directed    Historical  Provider, MD  Syringe, Disposable, 3 ML MISC DX: E11.9/E11.65  Use bid as directed 08/20/14   Mosie Lukes, MD    Family History Family History  Problem Relation Age of Onset  . Other Mother     CHF  . Stroke Father   . Hyperlipidemia Father   . Heart disease Father     s/p valve replacement  . Hyperlipidemia Brother   . Obesity Brother   . Other Brother     Back pain  . Hyperlipidemia Brother   . Hypertension Brother   . Other Brother     Panic attacks  . Hyperlipidemia Brother   . Anesthesia problems Neg Hx     Social History Social History  Substance Use Topics  . Smoking status: Former Smoker    Packs/day: 1.00    Years: 9.00    Types: Cigarettes    Quit date: 03/31/1975  . Smokeless tobacco: Never Used  . Alcohol use No     Comment: 4 beers a month, 06/12/11 rarely uses now     Allergies   Review of patient's allergies indicates no known allergies.   Review of Systems Review of Systems  Constitutional: Negative for fever.  HENT: Negative for rhinorrhea.   Respiratory: Positive for shortness of breath. Negative for cough and sputum production.   Cardiovascular: Negative for chest pain.  Gastrointestinal: Negative for abdominal pain.  Neurological: Negative for headaches.  All other systems reviewed and are negative.    Physical Exam Updated Vital  Signs BP 161/88 (BP Location: Left Arm)   Pulse 106   Temp 98.2 F (36.8 C) (Oral)   Resp 18   SpO2 93%   Physical Exam  Constitutional: He is oriented to person, place, and time. He appears well-nourished. No distress.  HENT:  Head: Normocephalic and atraumatic.  Right Ear: External ear normal.  Left Ear: External ear normal.  Eyes: Pupils are equal, round, and reactive to light. Right eye exhibits no discharge. Left eye exhibits no discharge. No scleral icterus.  Neck: Normal range of motion. Neck supple.  Cardiovascular: Normal rate.  Exam reveals no gallop and no friction rub.   No murmur heard. Pulmonary/Chest: Effort normal. No stridor. No respiratory distress. He has decreased breath sounds in the left upper field, the left middle field and the left lower field. He has no wheezes. He has no rales. He exhibits no tenderness.  Abdominal: Soft. He exhibits no distension and no mass. There is no tenderness. There is no rebound and no guarding.  Musculoskeletal: He exhibits no edema or tenderness.  Neurological: He is alert and oriented to person, place, and time.  Skin: Skin is warm and dry. No rash noted. He is not diaphoretic. No erythema.     ED Treatments / Results  Labs (all labs ordered are listed, but only abnormal results are displayed) Labs Reviewed  BASIC METABOLIC PANEL - Abnormal; Notable for the following:       Result Value   Glucose, Bld 246 (*)    Creatinine, Ser 1.26 (*)    All other components within normal limits  CBC WITH DIFFERENTIAL/PLATELET  TYPE AND SCREEN  ABO/RH    EKG  EKG Interpretation None       Radiology Dg Chest 2 View  Result Date: 11/16/2015 CLINICAL DATA:  Dry cough and shortness breath beginning yesterday. Left-sided chest pain. EXAM: CHEST  2 VIEW COMPARISON:  07/02/2015 FINDINGS: Left-sided hydro pneumothorax without tension. Pneumothorax component is approximately 25%. Right  chest shows abnormal hilar and perihilar density  consistent with the patient's neoplastic lesion. No acute bone finding. IMPRESSION: Hydro pneumothorax on the left with pneumothorax component approximately 25%. No sign of tension. These results were called by telephone at the time of interpretation on 11/16/2015 at 9:47 pm to EDP , who verbally acknowledged these results. Electronically Signed   By: Nelson Chimes M.D.   On: 11/16/2015 21:51    Procedures Procedures (including critical care time)  Medications Ordered in ED Medications  lidocaine-EPINEPHrine (XYLOCAINE W/EPI) 1 %-1:100000 (with pres) injection 20 mL (20 mLs Intradermal Not Given 11/17/15 0009)  albuterol (PROVENTIL) (2.5 MG/3ML) 0.083% nebulizer solution 5 mg (5 mg Nebulization Given 11/16/15 2123)  lidocaine-EPINEPHrine (XYLOCAINE W/EPI) 2 %-1:100000 (with pres) injection (1 mL  Given 11/17/15 0002)  midazolam (VERSED) injection 2 mg (2 mg Intravenous Given 11/17/15 0005)  morphine 2 MG/ML injection 2 mg (2 mg Intravenous Given 11/17/15 0002)     Initial Impression / Assessment and Plan / ED Course  I have reviewed the triage vital signs and the nursing notes.  Pertinent labs & imaging results that were available during my care of the patient were reviewed by me and considered in my medical decision making (see chart for details).  Clinical Course    Spontaneous pneumothorax on the left. Chest x-ray with Hydro/pneumo confirmed. Patient is satting well in no acute respiratory distress. 100% O2 nonrebreather mask applied. Screening labs obtained.CT surgery consulted to evaluate the patient in the emergency department and placed a chest tube. Patient will be transferred to [facility] for admission and further management.    Final Clinical Impressions(s) / ED Diagnoses   Final diagnoses:  Hydrothorax  Pneumothorax    Disposition: Admit  Condition: Stable    Fatima Blank, MD 11/17/15 [phone]

## 2015-11-17 ENCOUNTER — Inpatient Hospital Stay (HOSPITAL_COMMUNITY): Payer: BLUE CROSS/BLUE SHIELD

## 2015-11-17 ENCOUNTER — Emergency Department (HOSPITAL_COMMUNITY): Payer: BLUE CROSS/BLUE SHIELD

## 2015-11-17 DIAGNOSIS — Z823 Family history of stroke: Secondary | ICD-10-CM | POA: Diagnosis not present

## 2015-11-17 DIAGNOSIS — J948 Other specified pleural conditions: Secondary | ICD-10-CM | POA: Diagnosis present

## 2015-11-17 DIAGNOSIS — Z87891 Personal history of nicotine dependence: Secondary | ICD-10-CM | POA: Diagnosis not present

## 2015-11-17 DIAGNOSIS — Z85828 Personal history of other malignant neoplasm of skin: Secondary | ICD-10-CM | POA: Diagnosis not present

## 2015-11-17 DIAGNOSIS — K219 Gastro-esophageal reflux disease without esophagitis: Secondary | ICD-10-CM | POA: Diagnosis present

## 2015-11-17 DIAGNOSIS — Z923 Personal history of irradiation: Secondary | ICD-10-CM | POA: Diagnosis not present

## 2015-11-17 DIAGNOSIS — H9190 Unspecified hearing loss, unspecified ear: Secondary | ICD-10-CM | POA: Diagnosis present

## 2015-11-17 DIAGNOSIS — Z794 Long term (current) use of insulin: Secondary | ICD-10-CM | POA: Diagnosis not present

## 2015-11-17 DIAGNOSIS — C7801 Secondary malignant neoplasm of right lung: Secondary | ICD-10-CM | POA: Diagnosis present

## 2015-11-17 DIAGNOSIS — C7802 Secondary malignant neoplasm of left lung: Secondary | ICD-10-CM | POA: Diagnosis present

## 2015-11-17 DIAGNOSIS — E1165 Type 2 diabetes mellitus with hyperglycemia: Secondary | ICD-10-CM | POA: Diagnosis present

## 2015-11-17 DIAGNOSIS — E782 Mixed hyperlipidemia: Secondary | ICD-10-CM | POA: Diagnosis present

## 2015-11-17 DIAGNOSIS — E669 Obesity, unspecified: Secondary | ICD-10-CM | POA: Diagnosis present

## 2015-11-17 DIAGNOSIS — E039 Hypothyroidism, unspecified: Secondary | ICD-10-CM | POA: Diagnosis present

## 2015-11-17 DIAGNOSIS — G4733 Obstructive sleep apnea (adult) (pediatric): Secondary | ICD-10-CM | POA: Diagnosis present

## 2015-11-17 DIAGNOSIS — Z79899 Other long term (current) drug therapy: Secondary | ICD-10-CM | POA: Diagnosis not present

## 2015-11-17 DIAGNOSIS — J9383 Other pneumothorax: Secondary | ICD-10-CM

## 2015-11-17 DIAGNOSIS — I10 Essential (primary) hypertension: Secondary | ICD-10-CM | POA: Diagnosis present

## 2015-11-17 LAB — TYPE AND SCREEN
ABO/RH(D): A NEG
Antibody Screen: NEGATIVE

## 2015-11-17 LAB — CBC
HEMATOCRIT: 41.4 % (ref 39.0–52.0)
Hemoglobin: 13.5 g/dL (ref 13.0–17.0)
MCH: 29.3 pg (ref 26.0–34.0)
MCHC: 32.6 g/dL (ref 30.0–36.0)
MCV: 89.8 fL (ref 78.0–100.0)
PLATELETS: 174 10*3/uL (ref 150–400)
RBC: 4.61 MIL/uL (ref 4.22–5.81)
RDW: 13.7 % (ref 11.5–15.5)
WBC: 7.1 10*3/uL (ref 4.0–10.5)

## 2015-11-17 LAB — BASIC METABOLIC PANEL
Anion gap: 11 (ref 5–15)
BUN: 17 mg/dL (ref 6–20)
CALCIUM: 8.9 mg/dL (ref 8.9–10.3)
CO2: 27 mmol/L (ref 22–32)
CREATININE: 1.25 mg/dL — AB (ref 0.61–1.24)
Chloride: 101 mmol/L (ref 101–111)
GFR calc Af Amer: >60 mL/min (ref >60)
GFR calc non Af Amer: >60 mL/min (ref >60)
GLUCOSE: 241 mg/dL — AB (ref 65–99)
Potassium: 4.3 mmol/L (ref 3.5–5.1)
Sodium: 139 mmol/L (ref 135–145)

## 2015-11-17 LAB — GLUCOSE, CAPILLARY
GLUCOSE-CAPILLARY: 182 mg/dL — AB (ref 65–99)
GLUCOSE-CAPILLARY: 254 mg/dL — AB (ref 65–99)
GLUCOSE-CAPILLARY: 258 mg/dL — AB (ref 65–99)
Glucose-Capillary: 243 mg/dL — ABNORMAL HIGH (ref 65–99)

## 2015-11-17 LAB — ABO/RH: ABO/RH(D): A NEG

## 2015-11-17 MED ORDER — METFORMIN HCL 500 MG PO TABS
1000.0000 mg | ORAL_TABLET | Freq: Two times a day (BID) | ORAL | Status: DC
  Administered 2015-11-17 – 2015-11-20 (×7): 1000 mg via ORAL
  Filled 2015-11-17 (×7): qty 2

## 2015-11-17 MED ORDER — ONDANSETRON HCL 4 MG/2ML IJ SOLN
4.0000 mg | Freq: Four times a day (QID) | INTRAMUSCULAR | Status: DC | PRN
  Administered 2015-11-17: 4 mg via INTRAVENOUS
  Filled 2015-11-17 (×2): qty 2

## 2015-11-17 MED ORDER — BUDESONIDE 0.25 MG/2ML IN SUSP
0.2500 mg | Freq: Two times a day (BID) | RESPIRATORY_TRACT | Status: DC
  Administered 2015-11-17 – 2015-11-20 (×5): 0.25 mg via RESPIRATORY_TRACT
  Filled 2015-11-17 (×7): qty 2

## 2015-11-17 MED ORDER — NALOXONE HCL 0.4 MG/ML IJ SOLN: 0.4000 mg | INTRAMUSCULAR | Status: DC | PRN

## 2015-11-17 MED ORDER — SODIUM CHLORIDE 0.9% FLUSH
9.0000 mL | INTRAVENOUS | Status: DC | PRN
Start: 2015-11-17 — End: 2015-11-19

## 2015-11-17 MED ORDER — SODIUM CHLORIDE 0.9 % IV SOLN
INTRAVENOUS | Status: DC
  Administered 2015-11-17: 04:00:00 via INTRAVENOUS

## 2015-11-17 MED ORDER — ONDANSETRON HCL 4 MG/2ML IJ SOLN
4.0000 mg | Freq: Four times a day (QID) | INTRAMUSCULAR | Status: DC | PRN
  Administered 2015-11-18: 4 mg via INTRAVENOUS
  Filled 2015-11-17: qty 2

## 2015-11-17 MED ORDER — INSULIN GLARGINE 100 UNIT/ML ~~LOC~~ SOLN: 100.0000 [IU] | Freq: Every day | SUBCUTANEOUS | Status: DC

## 2015-11-17 MED ORDER — ALBUTEROL SULFATE (2.5 MG/3ML) 0.083% IN NEBU: 2.5000 mg | INHALATION_SOLUTION | Freq: Four times a day (QID) | RESPIRATORY_TRACT | Status: DC | PRN

## 2015-11-17 MED ORDER — INSULIN ASPART 100 UNIT/ML ~~LOC~~ SOLN
0.0000 [IU] | Freq: Three times a day (TID) | SUBCUTANEOUS | Status: DC
  Administered 2015-11-17: 4 [IU] via SUBCUTANEOUS
  Administered 2015-11-17: 11 [IU] via SUBCUTANEOUS
  Administered 2015-11-17 – 2015-11-18 (×3): 7 [IU] via SUBCUTANEOUS
  Administered 2015-11-19: 11 [IU] via SUBCUTANEOUS
  Administered 2015-11-19 (×2): 7 [IU] via SUBCUTANEOUS
  Administered 2015-11-20: 3 [IU] via SUBCUTANEOUS
  Administered 2015-11-20: 7 [IU] via SUBCUTANEOUS

## 2015-11-17 MED ORDER — SODIUM CHLORIDE 0.9% FLUSH: 9.0000 mL | INTRAVENOUS | Status: DC | PRN

## 2015-11-17 MED ORDER — VITAMIN C 500 MG PO TABS
1000.0000 mg | ORAL_TABLET | Freq: Every day | ORAL | Status: DC
  Administered 2015-11-17 – 2015-11-20 (×4): 1000 mg via ORAL
  Filled 2015-11-17 (×4): qty 2

## 2015-11-17 MED ORDER — INSULIN GLARGINE 100 UNIT/ML ~~LOC~~ SOLN
100.0000 [IU] | Freq: Every day | SUBCUTANEOUS | Status: DC
  Administered 2015-11-17: 100 [IU] via SUBCUTANEOUS
  Filled 2015-11-17 (×2): qty 1

## 2015-11-17 MED ORDER — FENTANYL 40 MCG/ML IV SOLN
INTRAVENOUS | Status: DC
  Administered 2015-11-18: 105 ug via INTRAVENOUS
  Administered 2015-11-18: 90 ug via INTRAVENOUS
  Administered 2015-11-18: 15 ug via INTRAVENOUS
  Administered 2015-11-18: 135 ug via INTRAVENOUS
  Administered 2015-11-19: 60 ug via INTRAVENOUS
  Administered 2015-11-19: 180 ug via INTRAVENOUS
  Administered 2015-11-19: 30 ug via INTRAVENOUS
  Administered 2015-11-19: 45 ug via INTRAVENOUS
  Filled 2015-11-17: qty 25

## 2015-11-17 MED ORDER — DIPHENHYDRAMINE HCL 12.5 MG/5ML PO ELIX: 12.5000 mg | ORAL_SOLUTION | Freq: Four times a day (QID) | ORAL | Status: DC | PRN

## 2015-11-17 MED ORDER — FENOFIBRATE 160 MG PO TABS
160.0000 mg | ORAL_TABLET | Freq: Every day | ORAL | Status: DC
  Administered 2015-11-17 – 2015-11-20 (×4): 160 mg via ORAL
  Filled 2015-11-17 (×4): qty 1

## 2015-11-17 MED ORDER — LEVOTHYROXINE SODIUM 75 MCG PO TABS
75.0000 ug | ORAL_TABLET | Freq: Every day | ORAL | Status: DC
  Administered 2015-11-17 – 2015-11-20 (×4): 75 ug via ORAL
  Filled 2015-11-17 (×4): qty 1

## 2015-11-17 MED ORDER — TRAMADOL HCL 50 MG PO TABS
50.0000 mg | ORAL_TABLET | Freq: Two times a day (BID) | ORAL | Status: DC
  Administered 2015-11-17 – 2015-11-19 (×3): 50 mg via ORAL
  Filled 2015-11-17 (×4): qty 1

## 2015-11-17 MED ORDER — MUPIROCIN 2 % EX OINT
TOPICAL_OINTMENT | Freq: Two times a day (BID) | CUTANEOUS | Status: DC
  Administered 2015-11-17 – 2015-11-20 (×5): via TOPICAL
  Filled 2015-11-17 (×2): qty 22

## 2015-11-17 MED ORDER — IBUPROFEN 600 MG PO TABS
600.0000 mg | ORAL_TABLET | Freq: Four times a day (QID) | ORAL | Status: DC | PRN
  Administered 2015-11-17: 600 mg via ORAL
  Filled 2015-11-17: qty 1

## 2015-11-17 MED ORDER — FAMOTIDINE 20 MG PO TABS
20.0000 mg | ORAL_TABLET | Freq: Two times a day (BID) | ORAL | Status: DC
  Administered 2015-11-17 – 2015-11-20 (×6): 20 mg via ORAL
  Filled 2015-11-17 (×6): qty 1

## 2015-11-17 MED ORDER — ROSUVASTATIN CALCIUM 10 MG PO TABS
40.0000 mg | ORAL_TABLET | Freq: Every day | ORAL | Status: DC
  Administered 2015-11-17 – 2015-11-20 (×4): 40 mg via ORAL
  Filled 2015-11-17 (×5): qty 4

## 2015-11-17 MED ORDER — PNEUMOCOCCAL VAC POLYVALENT 25 MCG/0.5ML IJ INJ: 0.5000 mL | INJECTION | INTRAMUSCULAR | Status: DC

## 2015-11-17 MED ORDER — LOSARTAN POTASSIUM 50 MG PO TABS
50.0000 mg | ORAL_TABLET | Freq: Every day | ORAL | Status: DC
  Administered 2015-11-17 – 2015-11-20 (×4): 50 mg via ORAL
  Filled 2015-11-17 (×4): qty 1

## 2015-11-17 MED ORDER — INSULIN ASPART 100 UNIT/ML ~~LOC~~ SOLN
0.0000 [IU] | Freq: Every day | SUBCUTANEOUS | Status: DC
  Administered 2015-11-18: 4 [IU] via SUBCUTANEOUS

## 2015-11-17 MED ORDER — MORPHINE SULFATE 2 MG/ML IV SOLN
INTRAVENOUS | Status: DC
  Administered 2015-11-17: 10.5 mL via INTRAVENOUS
  Administered 2015-11-17 (×2): via INTRAVENOUS
  Administered 2015-11-17: 16.5 mg via INTRAVENOUS
  Administered 2015-11-17: 16.7 mg via INTRAVENOUS
  Filled 2015-11-17 (×2): qty 25

## 2015-11-17 MED ORDER — DIPHENHYDRAMINE HCL 50 MG/ML IJ SOLN: 12.5000 mg | Freq: Four times a day (QID) | INTRAMUSCULAR | Status: DC | PRN

## 2015-11-17 MED ORDER — PROMETHAZINE HCL 25 MG/ML IJ SOLN
12.5000 mg | Freq: Four times a day (QID) | INTRAMUSCULAR | Status: DC | PRN
  Administered 2015-11-18 – 2015-11-20 (×3): 12.5 mg via INTRAVENOUS
  Filled 2015-11-17 (×3): qty 1

## 2015-11-17 MED ORDER — INSULIN GLARGINE 100 UNIT/ML ~~LOC~~ SOLN
60.0000 [IU] | Freq: Every day | SUBCUTANEOUS | Status: DC
  Filled 2015-11-17 (×2): qty 0.6

## 2015-11-17 MED ORDER — INSULIN GLARGINE 100 UNIT/ML SOLOSTAR PEN: 60.0000 [IU] | PEN_INJECTOR | Freq: Every day | SUBCUTANEOUS | Status: DC

## 2015-11-17 MED ORDER — DOXYCYCLINE HYCLATE 100 MG PO TABS
100.0000 mg | ORAL_TABLET | Freq: Two times a day (BID) | ORAL | Status: DC
  Administered 2015-11-17 – 2015-11-20 (×6): 100 mg via ORAL
  Filled 2015-11-17 (×6): qty 1

## 2015-11-17 MED ORDER — ENOXAPARIN SODIUM 40 MG/0.4ML ~~LOC~~ SOLN
40.0000 mg | SUBCUTANEOUS | Status: DC
  Administered 2015-11-17 – 2015-11-20 (×4): 40 mg via SUBCUTANEOUS
  Filled 2015-11-17 (×4): qty 0.4

## 2015-11-17 NOTE — ED Notes (Signed)
MD at bedside for chest tube place.

## 2015-11-17 NOTE — ED Notes (Signed)
[facility] here for transport to [place].  

## 2015-11-17 NOTE — ED Notes (Signed)
Procedure for pneumothorax performed by Dr.Hendrickson. Required equipment at bedside. Time out called at 2355 on 11/16/15 and pre-procedure medications given. See Vital sign assessment for further documentation. Pt remained alert and oriented x 4 during entire procedure. 0012 Lidocaine administered by Dr.Hendrickson. 0023 Chest tube placed and connected to Pleruvac. 0025 procedure completed. Pt to be transferred to [facility] per [place] for further continuity of care.

## 2015-11-17 NOTE — Progress Notes (Signed)
Patient very pleasant , admitted from [facility] long ED. Alert and oriented , chest tube in place. Vitals stable. Oriented to room and surroundings. Wife by the bedside.

## 2015-11-17 NOTE — ED Notes (Signed)
[facility] notified of need for transport over to [facility].

## 2015-11-17 NOTE — Progress Notes (Signed)
Patient came to me from [facility] long ED with order for PCA pump. No medication was ordered. Nurse paged Dr. Roxan Hockey . Telephone order received for Morphine PCA full dose. Patient updated about the delay.Nurse administered Motrin while the PCA pump and pharmacy verify medication.

## 2015-11-17 NOTE — H&P (Signed)
Steven Ibarra is an 60 y.o. male.   Chief Complaint: CP, SOB HPI: 60 yo man with history of basal cell carcinoma of neck with bilateral lung metastases who developed left sided Cp and SOB about midnight last night. Pain persisted then worsened this evening. Came to [facility] ED. CXR showed a left hydropneumothorax. Currently pain is 8/10.  Past Medical History:  Diagnosis Date  . ALLERGIC RHINITIS, SEASONAL 10/23/2009  . Basal cell carcinoma of neck 05/28/2011   r suprahyoid, radical neck dissection S/p 6 weeks of targeted radiation therapy   . Cancer ([place])   . Costochondritis 09/16/2015  . Diabetes mellitus approx 2003   type 2  . DIABETES MELLITUS, TYPE II 10/23/2009  . DYSPNEA ON EXERTION 10/23/2009  . ELEVATED BLOOD PRESSURE 04/23/2010  . GERD (gastroesophageal reflux disease)   . Hearing loss   . Hyperlipidemia   . Hypertension    Does not see a cardiologist, has not had a stress, echo   . Hypothyroid 01/28/2012  . Lung cancer ([place]) 03/08/14   invasive squamous cell carcinoma  . Mixed hyperlipidemia 10/23/2009  . Obesity   . OSA (obstructive sleep apnea)   . Otitis externa of right ear 07/06/2012  . Overweight(278.02) 10/23/2009  . Preventative health care 09/30/2011  . Pulled muscle 07/06/12  . S/P radiation therapy 07/06/11 - 08/19/11   Right Facial and Right Neck Nodes and right Trigeminal  Nerve to Base of Skull/ Total Dose 6600 cGy/ 33 Fractions  . Skin cancer    on nose, 9-10 yrs ago  . SLEEP APNEA, OBSTRUCTIVE 10/23/2009   Sleep study Done at [facility]  . SOB (shortness of breath) 09/16/2015  . Type II or unspecified type diabetes mellitus with unspecified complication, uncontrolled    type 2     Past Surgical History:  Procedure Laterality Date  . LUNG BIOPSY Right 03/08/2014  . RADICAL NECK DISSECTION  05/28/2011   Procedure: RADICAL NECK DISSECTION;  Surgeon: Melissa Montane, MD;  Location: [place];  Service: ENT;  Laterality: N/A;  Suprahyoid Neck Dissection  . skin cancer removal     . TONSILLECTOMY AND ADENOIDECTOMY      Family History  Problem Relation Age of Onset  . Other Mother     CHF  . Stroke Father   . Hyperlipidemia Father   . Heart disease Father     s/p valve replacement  . Hyperlipidemia Brother   . Obesity Brother   . Other Brother     Back pain  . Hyperlipidemia Brother   . Hypertension Brother   . Other Brother     Panic attacks  . Hyperlipidemia Brother   . Anesthesia problems Neg Hx    Social History:  reports that he quit smoking about 40 years ago. His smoking use included Cigarettes. He has a 9.00 pack-year smoking history. He has never used smokeless tobacco. He reports that he does not drink alcohol or use drugs.  Allergies: No Known Allergies   (Not in a hospital admission)  Results for orders placed or performed during the hospital encounter of 11/16/15 (from the past 48 hour(s))  CBC with Differential/Platelet     Status: None   Collection Time: 11/16/15 11:08 PM  Result Value Ref Range   WBC 7.4 4.0 - 10.5 K/uL   RBC 4.86 4.22 - 5.81 MIL/uL   Hemoglobin 14.8 13.0 - 17.0 g/dL   HCT 41.8 39.0 - 52.0 %   MCV 86.0 78.0 - 100.0 fL  MCH 30.5 26.0 - 34.0 pg   MCHC 35.4 30.0 - 36.0 g/dL   RDW 13.4 11.5 - 15.5 %   Platelets 196 150 - 400 K/uL   Neutrophils Relative % 76 %   Neutro Abs 5.6 1.7 - 7.7 K/uL   Lymphocytes Relative 11 %   Lymphs Abs 0.8 0.7 - 4.0 K/uL   Monocytes Relative 11 %   Monocytes Absolute 0.8 0.1 - 1.0 K/uL   Eosinophils Relative 2 %   Eosinophils Absolute 0.2 0.0 - 0.7 K/uL   Basophils Relative 0 %   Basophils Absolute 0.0 0.0 - 0.1 K/uL  Basic metabolic panel     Status: Abnormal   Collection Time: 11/16/15 11:08 PM  Result Value Ref Range   Sodium 139 135 - 145 mmol/L   Potassium 3.8 3.5 - 5.1 mmol/L   Chloride 104 101 - 111 mmol/L   CO2 28 22 - 32 mmol/L   Glucose, Bld 246 (H) 65 - 99 mg/dL   BUN 19 6 - 20 mg/dL   Creatinine, Ser 1.26 (H) 0.61 - 1.24 mg/dL   Calcium 9.2 8.9 - 10.3 mg/dL    GFR calc non Af Amer >60 >60 mL/min   GFR calc Af Amer >60 >60 mL/min    Comment: (NOTE) The eGFR has been calculated using the CKD EPI equation. This calculation has not been validated in all clinical situations. eGFR's persistently <60 mL/min signify possible Chronic Kidney Disease.    Anion gap 7 5 - 15  Type and screen [place]     Status: None   Collection Time: 11/16/15 11:08 PM  Result Value Ref Range   ABO/RH(D) A NEG    Antibody Screen NEG    Sample Expiration 11/19/2015    Dg Chest 2 View  Result Date: 11/16/2015 CLINICAL DATA:  Dry cough and shortness breath beginning yesterday. Left-sided chest pain. EXAM: CHEST  2 VIEW COMPARISON:  07/02/2015 FINDINGS: Left-sided hydro pneumothorax without tension. Pneumothorax component is approximately 25%. Right chest shows abnormal hilar and perihilar density consistent with the patient's neoplastic lesion. No acute bone finding. IMPRESSION: Hydro pneumothorax on the left with pneumothorax component approximately 25%. No sign of tension. These results were called by telephone at the time of interpretation on 11/16/2015 at 9:47 pm to EDP , who verbally acknowledged these results. Electronically Signed   By: Nelson Chimes M.D.   On: 11/16/2015 21:51    Review of Systems  Respiratory: Positive for shortness of breath.   Cardiovascular: Positive for chest pain.    Blood pressure 93/57, pulse 87, temperature 98.2 F (36.8 C), temperature source Oral, resp. rate 19, SpO2 98 %. Physical Exam  Vitals reviewed. Constitutional: He is oriented to person, place, and time. He appears distressed (mildly).  Morbidly obese  HENT:  Head: Normocephalic and atraumatic.  Eyes: Conjunctivae and EOM are normal. No scleral icterus.  Neck: Neck supple. No tracheal deviation present. No thyromegaly present.  Cardiovascular:  No murmur heard. Tachy, regular  Respiratory: No respiratory distress. He has no wheezes. He has no rales.   Diminished BS left side  GI: Soft. He exhibits no distension. There is no tenderness.  Lymphadenopathy:    He has no cervical adenopathy.  Neurological: He is alert and oriented to person, place, and time. No cranial nerve deficit.  Skin: Skin is warm and dry.  Mild erythema right leg  Psychiatric: He has a normal mood and affect.     Assessment/Plan 60 yo man  with CP and SOB who has a hydropneumothorax. Needs left chest tube. I discussed the indications, risks, benefits and alternatives with him. He understands risks of bleeding, tube malposition and injury to underlying organs. He accepts risks and agrees to proceed.  Premed with Morphine and Versed  CT to suction  CPAP for OSA  DM management  SCD + enoxaparin(starting tomorrow) for DVT prophylaxis  Melrose Nakayama, MD 11/17/2015, 12:33 AM

## 2015-11-17 NOTE — Progress Notes (Addendum)
[address]       [place] [address]             [phone]         Subjective: Feels better, not SOB  Objective: Vital signs in last 24 hours: Temp:  [97.5 F (36.4 C)-98.2 F (36.8 C)] 97.5 F (36.4 C) (08/20 0540) Pulse Rate:  [84-111] 88 (08/20 0540) Cardiac Rhythm: Normal sinus rhythm (08/20 0342) Resp:  [15-23] 17 (08/20 0734) BP: (93-166)/(57-100) 145/75 (08/20 0540) SpO2:  [93 %-100 %] 93 % (08/20 0734) Weight:  [311 lb 6.4 oz (141.3 kg)-315 lb (142.9 kg)] 311 lb 6.4 oz (141.3 kg) (08/20 0356)  Hemodynamic parameters for last 24 hours:    Intake/Output from previous day: 08/19 0701 - 08/20 0700 In: -  Out: 800 [Urine:200] Intake/Output this shift: No intake/output data recorded.  General appearance: alert, cooperative and no distress Heart: regular rate and rhythm Lungs: clear to auscultation bilaterally Abdomen: benign Extremities: no edema Wound: CT site - ok  Lab Results:  Recent Labs  11/16/15 2308 11/17/15 0646  WBC 7.4 7.1  HGB 14.8 13.5  HCT 41.8 41.4  PLT 196 174   BMET:   Recent Labs  11/16/15 2308  NA 139  K 3.8  CL 104  CO2 28  GLUCOSE 246*  BUN 19  CREATININE 1.26*  CALCIUM 9.2    PT/INR: No results for input(s): LABPROT, INR in the last 72 hours. ABG No results found for: PHART, HCO3, TCO2, ACIDBASEDEF, O2SAT CBG (last 3)   Recent Labs  11/17/15 0635  GLUCAP 243*    Meds Scheduled Meds: . budesonide  0.25 mg Inhalation BID  . doxycycline  100 mg Oral BID  . famotidine  20 mg Oral BID  . fenofibrate  160 mg Oral Daily  . insulin aspart  0-20 Units Subcutaneous TID WC  . insulin aspart  0-5 Units Subcutaneous QHS  . insulin glargine  100 Units Subcutaneous Daily   And  . insulin glargine  60 Units Subcutaneous QHS  . levothyroxine  75 mcg Oral QAC breakfast  . losartan  50 mg Oral Daily  . metFORMIN  1,000 mg Oral BID WC  . morphine   Intravenous Q4H  . mupirocin ointment   Topical  BID  . [START ON 11/18/2015] pneumococcal 23 valent vaccine  0.5 mL Intramuscular Tomorrow-1000  . rosuvastatin  40 mg Oral Daily  . traMADol  50 mg Oral BID  . vitamin C  1,000 mg Oral Daily   Continuous Infusions: . sodium chloride 10 mL/hr at 11/17/15 0413   PRN Meds:.albuterol, diphenhydrAMINE **OR** diphenhydrAMINE, ibuprofen, naloxone **AND** sodium chloride flush, ondansetron (ZOFRAN) IV  Xrays Dg Chest 2 View  Result Date: 11/16/2015 CLINICAL DATA:  Dry cough and shortness breath beginning yesterday. Left-sided chest pain. EXAM: CHEST  2 VIEW COMPARISON:  07/02/2015 FINDINGS: Left-sided hydro pneumothorax without tension. Pneumothorax component is approximately 25%. Right chest shows abnormal hilar and perihilar density consistent with the patient's neoplastic lesion. No acute bone finding. IMPRESSION: Hydro pneumothorax on the left with pneumothorax component approximately 25%. No sign of tension. These results were called by telephone at the time of interpretation on 11/16/2015 at 9:47 pm to EDP , who verbally acknowledged these results. Electronically Signed   By: Nelson Chimes M.D.   On: 11/16/2015 21:51   Dg Chest Port 1 View  Result Date: 11/17/2015 CLINICAL DATA:  Hydropneumothorax EXAM: PORTABLE CHEST 1 VIEW COMPARISON:  Earlier  this morning at 0031 hours. FINDINGS: 518 hours. Left chest tube may have been retracted minimally. Midline trachea. Borderline cardiomegaly. The left apical pneumothorax has improved. Subtly apparent projecting over the left clavicle. Approximately 5% There is minimal left-sided subcutaneous emphysema. No pleural fluid. Similar left base atelectasis. Right perihilar airspace disease. No congestive failure. IMPRESSION: Left chest tube remaining in place with decrease in approximately 5% left apical pneumothorax. Similar right perihilar airspace disease. Electronically Signed   By: Abigail Miyamoto M.D.   On: 11/17/2015 07:37   Dg Chest Portable 1 View  Result  Date: 11/17/2015 CLINICAL DATA:  Acute onset of left-sided chest tube placement. Initial encounter. EXAM: PORTABLE CHEST 1 VIEW COMPARISON:  Chest radiograph performed 11/16/2015 FINDINGS: The lungs are well-aerated. Mild vascular congestion is noted. Right midlung airspace opacity raises concern for pneumonia. A left basilar chest tube is noted, with a residual small left-sided pneumothorax, improved from the prior study. No significant pleural effusion is seen. Mild soft tissue air is seen tracking along the left chest wall and left side of the neck. The cardiomediastinal silhouette is borderline normal in size. No acute osseous abnormalities are seen. IMPRESSION: 1. Left basilar chest tube, with residual small left-sided pneumothorax, improved from the prior study. 2. Mild vascular congestion noted. Right midlung airspace opacity raises concern for pneumonia, more prominent than on the prior study. Would correlate for associated symptoms. Electronically Signed   By: Garald Balding M.D.   On: 11/17/2015 00:55    Assessment/Plan: S/P  Left CT for hydropneumothx Sugar elevated- monitor on current rx for now CXR - 5 % pntx, airspace dz stable- cont CT 20 H2O suction- no air leak currently   LOS: 0 days    GOLD,WAYNE E 11/17/2015  Patient seen and examined, agree with above Keep CT to suction today  Remo Lipps C. Roxan Hockey, MD [facility] [phone]

## 2015-11-17 NOTE — Procedures (Signed)
After informed consent, premedicated with morphine and versed.  20 ml of 1% lidocaine for local  28 F CT placed left pleural space. No rush of air but serosanguinous fluid and small air leak with cough.  CXR OK postplacement  Revonda Standard. Roxan Hockey, MD [facility] [phone]

## 2015-11-18 ENCOUNTER — Inpatient Hospital Stay (HOSPITAL_COMMUNITY): Payer: BLUE CROSS/BLUE SHIELD

## 2015-11-18 LAB — GLUCOSE, CAPILLARY
GLUCOSE-CAPILLARY: 212 mg/dL — AB (ref 65–99)
GLUCOSE-CAPILLARY: 285 mg/dL — AB (ref 65–99)
GLUCOSE-CAPILLARY: 334 mg/dL — AB (ref 65–99)
Glucose-Capillary: 223 mg/dL — ABNORMAL HIGH (ref 65–99)

## 2015-11-18 MED ORDER — INSULIN GLARGINE 100 UNIT/ML ~~LOC~~ SOLN
110.0000 [IU] | Freq: Every day | SUBCUTANEOUS | Status: DC
  Administered 2015-11-18 – 2015-11-20 (×3): 110 [IU] via SUBCUTANEOUS
  Filled 2015-11-18 (×3): qty 1.1

## 2015-11-18 MED ORDER — INSULIN GLARGINE 100 UNIT/ML ~~LOC~~ SOLN
70.0000 [IU] | Freq: Every day | SUBCUTANEOUS | Status: DC
  Administered 2015-11-18 – 2015-11-19 (×2): 70 [IU] via SUBCUTANEOUS
  Filled 2015-11-18 (×3): qty 0.7

## 2015-11-18 MED ORDER — PNEUMOCOCCAL VAC POLYVALENT 25 MCG/0.5ML IJ INJ
0.5000 mL | INJECTION | INTRAMUSCULAR | Status: AC
  Administered 2015-11-19: 0.5 mL via INTRAMUSCULAR
  Filled 2015-11-18: qty 0.5

## 2015-11-18 NOTE — Progress Notes (Addendum)
      [address]       [place], [address]             [phone]         Subjective: Eating breakfast. No complaints.   Objective: Vital signs in last 24 hours: Temp:  [97.6 F (36.4 C)-98.2 F (36.8 C)] 97.6 F (36.4 C) (08/20 1952) Pulse Rate:  [81-93] 87 (08/21 0315) Cardiac Rhythm: Normal sinus rhythm (08/21 0700) Resp:  [10-20] 10 (08/21 0400) BP: (120-142)/(74-87) 120/75 (08/21 0315) SpO2:  [90 %-96 %] 93 % (08/21 0400)  Hemodynamic parameters for last 24 hours:    Intake/Output from previous day: 08/20 0701 - 08/21 0700 In: 150 [P.O.:150] Out: 310 [Urine:100; Chest Tube:210] Intake/Output this shift: No intake/output data recorded.  General appearance: alert and cooperative Heart: regular rate and rhythm, S1, S2 normal, no murmur, click, rub or gallop Lungs: clear to auscultation bilaterally Abdomen: soft, non-tender; bowel sounds normal; no masses,  no organomegaly Extremities: edema 1-2+ pitting pedal edema bilaterally Wound: c/d/i  Lab Results:  Recent Labs  11/16/15 2308 11/17/15 0646  WBC 7.4 7.1  HGB 14.8 13.5  HCT 41.8 41.4  PLT 196 174   BMET:  Recent Labs  11/16/15 2308 11/17/15 0646  NA 139 139  K 3.8 4.3  CL 104 101  CO2 28 27  GLUCOSE 246* 241*  BUN 19 17  CREATININE 1.26* 1.25*  CALCIUM 9.2 8.9    PT/INR: No results for input(s): LABPROT, INR in the last 72 hours. ABG No results found for: PHART, HCO3, TCO2, ACIDBASEDEF, O2SAT CBG (last 3)   Recent Labs  11/17/15 1533 11/17/15 2150 11/18/15 0614  GLUCAP 254* 258* 212*    Assessment/Plan: S/P Left CT for hydropneumothorax, chest tube on waterseal this morning without leak Blood glucose in the 200s. Was on considerably more insulin at home. Increase SSI and if still uncontrolled will switch back to home regimen. CXR- pending.     LOS: 1 day    Elgie Collard 11/18/2015  Patient seen and examined, agree with above CT to water sael Drained  about 200 ml yesterday  Remo Lipps C. Roxan Hockey, MD [facility] [phone]

## 2015-11-18 NOTE — Progress Notes (Signed)
Nicholes Rough, PA contacted to clarify there are no changes to SSI per note.  She wanted to adjust lantus before changing novolog.  Will reassess tomorrow.  Pt also requesting CPAP as per home use.  She will place order for respiratory to set up.

## 2015-11-19 ENCOUNTER — Inpatient Hospital Stay (HOSPITAL_COMMUNITY): Payer: BLUE CROSS/BLUE SHIELD

## 2015-11-19 LAB — BASIC METABOLIC PANEL
Anion gap: 5 (ref 5–15)
BUN: 21 mg/dL — AB (ref 6–20)
CHLORIDE: 103 mmol/L (ref 101–111)
CO2: 28 mmol/L (ref 22–32)
Calcium: 8.8 mg/dL — ABNORMAL LOW (ref 8.9–10.3)
Creatinine, Ser: 1.15 mg/dL (ref 0.61–1.24)
GFR calc Af Amer: >60 mL/min (ref >60)
GFR calc non Af Amer: >60 mL/min (ref >60)
GLUCOSE: 261 mg/dL — AB (ref 65–99)
POTASSIUM: 4.1 mmol/L (ref 3.5–5.1)
Sodium: 136 mmol/L (ref 135–145)

## 2015-11-19 LAB — CBC
HCT: 40.8 % (ref 39.0–52.0)
HEMOGLOBIN: 13.7 g/dL (ref 13.0–17.0)
MCH: 30.2 pg (ref 26.0–34.0)
MCHC: 33.6 g/dL (ref 30.0–36.0)
MCV: 89.9 fL (ref 78.0–100.0)
PLATELETS: 175 10*3/uL (ref 150–400)
RBC: 4.54 MIL/uL (ref 4.22–5.81)
RDW: 13.7 % (ref 11.5–15.5)
WBC: 5.9 10*3/uL (ref 4.0–10.5)

## 2015-11-19 LAB — GLUCOSE, CAPILLARY
GLUCOSE-CAPILLARY: 188 mg/dL — AB (ref 65–99)
Glucose-Capillary: 204 mg/dL — ABNORMAL HIGH (ref 65–99)
Glucose-Capillary: 243 mg/dL — ABNORMAL HIGH (ref 65–99)
Glucose-Capillary: 298 mg/dL — ABNORMAL HIGH (ref 65–99)

## 2015-11-19 MED ORDER — TRAMADOL HCL 50 MG PO TABS
100.0000 mg | ORAL_TABLET | Freq: Four times a day (QID) | ORAL | Status: DC
  Administered 2015-11-19 – 2015-11-20 (×4): 100 mg via ORAL
  Filled 2015-11-19 (×4): qty 2

## 2015-11-19 NOTE — Progress Notes (Addendum)
      [address]       [place] [address]             [phone]         Subjective: Did well overnight. Started Ultram and felt better.   Objective: Vital signs in last 24 hours: Temp:  [97.5 F (36.4 C)-98.1 F (36.7 C)] 97.7 F (36.5 C) (08/22 0636) Pulse Rate:  [87-95] 87 (08/22 0636) Cardiac Rhythm: Normal sinus rhythm (08/21 2100) Resp:  [10-21] 19 (08/22 0636) BP: (124-146)/(60-77) 129/60 (08/22 0636) SpO2:  [90 %-96 %] 92 % (08/22 0636)     Intake/Output from previous day: 08/21 0701 - 08/22 0700 In: 1000 [P.O.:1000] Out: 200 [Chest Tube:200] Intake/Output this shift: No intake/output data recorded.  General appearance: alert and cooperative Heart: regular rate and rhythm, S1, S2 normal, no murmur, click, rub or gallop Lungs: clear to auscultation bilaterally Abdomen: soft, non-tender; bowel sounds normal; no masses,  no organomegaly Extremities: edema 1+ pedal edema Wound: c/d/i  Lab Results:  Recent Labs  11/17/15 0646 11/19/15 0242  WBC 7.1 5.9  HGB 13.5 13.7  HCT 41.4 40.8  PLT 174 175   BMET:  Recent Labs  11/17/15 0646 11/19/15 0242  NA 139 136  K 4.3 4.1  CL 101 103  CO2 27 28  GLUCOSE 241* 261*  BUN 17 21*  CREATININE 1.25* 1.15  CALCIUM 8.9 8.8*    PT/INR: No results for input(s): LABPROT, INR in the last 72 hours. ABG No results found for: PHART, HCO3, TCO2, ACIDBASEDEF, O2SAT CBG (last 3)   Recent Labs  11/18/15 1618 11/18/15 2117 11/19/15 0634  GLUCAP 285* 334* 204*    Assessment/Plan: S/P Left CT for hydropneumothorax, chest tube on waterseal this morning without leak CXR 8/21 showed persistent left pneumothorax. CXR from this morning pending.  Blood glucose continues to be uncontrolled with added lantus dose, therefore will add more short-acting coverage and evaluate. Still has PCA pump-per patient not using often. Ultram started last night with relief. Plan to discontinue pump in the next 24  hours.     LOS: 2 days    Elgie Collard 11/19/2015  Patient seen and examined, agree with above No air leak- drained only 20 ml overnight- dc chest tube Possibly home tomorrow  Remo Lipps C. Roxan Hockey, MD [facility] [phone]

## 2015-11-19 NOTE — Discharge Instructions (Signed)
Pneumothorax °A pneumothorax, commonly called a collapsed lung, is a condition in which air leaks from a lung and builds up in the space between the lung and the chest wall (pleural space). The air in a pneumothorax is trapped outside the lung and takes up space, preventing the lung from fully expanding. This is a condition that usually occurs suddenly. The buildup of air may be small or large. A small pneumothorax may go away on its own. When a pneumothorax is larger, it will often require medical treatment and hospitalization.  °CAUSES  °A pneumothorax can sometimes happen quickly with no apparent cause. People with underlying lung problems, particularly COPD or emphysema, are at higher risk of pneumothorax. However, pneumothorax can happen quickly even in people with no prior known lung problems. Trauma, surgery, medical procedures, or injury to the chest wall can also cause a pneumothorax. °SIGNS AND SYMPTOMS  °Sometimes a pneumothorax will have no symptoms. When symptoms are present, they can include: °· Chest pain. °· Shortness of breath. °· Increased rate of breathing. °· Bluish color to your lips or skin (cyanosis). °DIAGNOSIS  °Pneumothorax is usually diagnosed by a chest X-ray or chest CT scan. Your health care provider will also take a medical history and perform a physical exam to determine why you may have a pneumothorax. °TREATMENT  °A small pneumothorax may go away on its own without treatment. Extra oxygen can sometimes help a small pneumothorax go away more quickly. For a larger pneumothorax or a pneumothorax that is causing symptoms, a procedure is usually needed to drain the air. In some cases, the health care provider may drain the air using a needle. In other cases, a chest tube may be inserted into the pleural space. A chest tube is a small tube placed between the ribs and into the pleural space. This removes the extra air and allows the lung to expand back to its normal size. A large  pneumothorax will usually require a hospital stay. If there is ongoing air leakage into the pleural space, then the chest tube may need to remain in place for several days until the air leak has healed. In some cases, surgery may be needed.  °HOME CARE INSTRUCTIONS  °· Only take over-the-counter or prescription medicines as directed by your health care provider. °· If a cough or pain makes it difficult for you to sleep at night, try sleeping in a semi-upright position in a recliner or by using 2 or 3 pillows. °· Rest and limit activity as directed by your health care provider. °· If you had a chest tube and it was removed, ask your health care provider when it is okay to remove the dressing. Until your health care provider says you can remove the dressing, do not allow it to get wet. °· Do not smoke. Smoking is a risk factor for pneumothorax. °· Do not fly in an airplane or scuba dive until your health care provider says it is okay. °· Follow up with your health care provider as directed. °SEEK IMMEDIATE MEDICAL CARE IF:  °· You have increasing chest pain or shortness of breath. °· You have a cough that is not controlled with suppressants. °· You begin coughing up blood. °· You have pain that is getting worse or is not controlled with medicines. °· You cough up thick, discolored mucus (sputum) that is yellow to green in color. °· You have redness, increasing pain, or discharge at the site where a chest tube had been in place (if   your pneumothorax was treated with a chest tube).  The site where your chest tube was located opens up.  You feel air coming out of the site where the chest tube was placed.  You have a fever or persistent symptoms for more than 2-3 days.  You have a fever and your symptoms suddenly get worse. MAKE SURE YOU:   Understand these instructions.  Will watch your condition.  Will get help right away if you are not doing well or get worse.   This information is not intended to  replace advice given to you by your health care provider. Make sure you discuss any questions you have with your health care provider.   Document Released: 03/16/2005 Document Revised: 01/04/2013 Document Reviewed: 10/13/2012 Elsevier Interactive Patient Education [employer].

## 2015-11-19 NOTE — Progress Notes (Signed)
Chest tube removed per order.

## 2015-11-19 NOTE — Progress Notes (Signed)
Inpatient Diabetes Program Recommendations  AACE/ADA: New Consensus Statement on Inpatient Glycemic Control (2015)  Target Ranges:  Prepandial:   less than 140 mg/dL      Peak postprandial:   less than 180 mg/dL (1-2 hours)      Critically ill patients:  140 - 180 mg/dL   Results for Steven Ibarra, Steven Ibarra (MRN [id number]) as of 11/19/2015 10:25  Ref. Range 11/18/2015 06:14 11/18/2015 11:02 11/18/2015 16:18 11/18/2015 21:17 11/19/2015 06:34  Glucose-Capillary Latest Ref Range: 65 - 99 mg/dL 212 (H) 223 (H) 285 (H) 334 (H) 204 (H)   Inpatient Diabetes Program Recommendations:  Noted plans to start meal coverage. Please consider starting with Novolog 5 units tid meal coverage if eats 50%.  Thank you, Nani Gasser. Treston Coker, RN, MSN, CDE Inpatient Glycemic Control Team Team Pager [phone] (8am-5pm) 11/19/2015 10:27 AM

## 2015-11-19 NOTE — Discharge Summary (Signed)
Physician Discharge Summary  Patient ID: Steven Ibarra MRN: [id number] DOB/AGE: 59-28-1957 59 y.o.  Admit date: 11/16/2015 Discharge date: 11/20/2015  Admission Diagnoses: Patient Active Problem List   Diagnosis Date Noted  . Hydropneumothorax 11/17/2015  . Costochondritis 09/16/2015  . SOB (shortness of breath) 09/16/2015  . Lung metastases ([place]) 04/04/2014  . Fatigue 03/16/2014  . Nasal congestion 03/16/2014  . Pulmonary hemorrhage 03/08/2014  . Acute respiratory failure with hypoxemia ([place]) 03/08/2014  . Lung nodules   . Otitis externa of right ear 07/06/2012  . Hypothyroid 01/28/2012  . Preventative health care 09/30/2011  . Skin cancer   . Hypertension   . GERD (gastroesophageal reflux disease)   . Diabetes mellitus type 2 in obese ([place])   . Basal cell carcinoma of neck 05/28/2011  . MIXED HYPERLIPIDEMIA 10/23/2009  . Overweight 10/23/2009  . OSA on CPAP 10/23/2009  . ALLERGIC RHINITIS, SEASONAL 10/23/2009  . DYSPNEA ON EXERTION 10/23/2009   Discharge Diagnoses:  Active Problems:   Hydropneumothorax   Discharged Condition:  Patient Active Problem List   Diagnosis Date Noted  . Hydropneumothorax s/p chest tube insertion and resolution 11/17/2015  . Costochondritis 09/16/2015  . SOB (shortness of breath) 09/16/2015  . Lung metastases ([place]) 04/04/2014  . Fatigue 03/16/2014  . Nasal congestion 03/16/2014  . Pulmonary hemorrhage 03/08/2014  . Acute respiratory failure with hypoxemia ([place]) 03/08/2014  . Lung nodules   . Otitis externa of right ear 07/06/2012  . Hypothyroid 01/28/2012  . Preventative health care 09/30/2011  . Skin cancer   . Hypertension   . GERD (gastroesophageal reflux disease)   . Diabetes mellitus type 2 in obese ([place])   . Basal cell carcinoma of neck 05/28/2011  . MIXED HYPERLIPIDEMIA 10/23/2009  . Overweight 10/23/2009  . OSA on CPAP 10/23/2009  . ALLERGIC RHINITIS, SEASONAL 10/23/2009  . DYSPNEA ON EXERTION 10/23/2009    HPI: 60  yo man with history of basal cell carcinoma of neck with bilateral lung metastases who developed left sided Cp and SOB about midnight last night. Pain persisted then worsened this evening. Came to [facility] ED. CXR showed a left hydropneumothorax. Currently pain is 8/10.  Hospital Course:  Mr. Reason had a chest tube placed by Dr. Roxan Hockey on 11/17/2015 for a left hydropneumothorax. He tolerated the procedure well. We obtained serial chest xrays and monitored chest tube output. We also increased his insulin for his uncontrolled diabetes mellitus type 2. His chest tube was removed POD 2 without complication. A chest xray was obtained which showed a stable pneumothorax. His pain has been controlled on tramadol by mouth. His blood sugar is better this morning. He will start back on his home diabetes medication and need to follow-up with his Endocrinologist. The diabetic educator was consulted.  He is ambulating with limited assistance and ready to be discharged. He will need to f/u in our office on Monday, 8/28 and need a chest xray before the appointment time.   Consults: Diabetic educator  Significant Diagnostic Studies: serial chest xrays, daily labs  Treatments: 11/17/2015 12:41 AM  Pre-procedure Diagnoses:  Hydropneumothorax [J94.8]  Post-procedure Diagnoses:  Hydropneumothorax [J94.8]  Procedures:  CHEST TUBE INSERTION [UUV253 (Custom)]          Discharge Exam: Blood pressure 119/73, pulse 82, temperature 98.4 F (36.9 C), temperature source Oral, resp. rate 20, height '6\' 6"'$  (1.981 m), weight (!) 311 lb 6.4 oz (141.3 kg), SpO2 95 %.  General appearance: alert and cooperative Heart: regular rate and rhythm, S1, S2  normal, no murmur, click, rub or gallop Lungs: clear to auscultation bilaterally Abdomen: soft, non-tender; bowel sounds normal; no masses,  no organomegaly Extremities: edema minimal pedal edema Wound: c/d/i  Disposition: 01-Home or Self Care     Medication List    TAKE  these medications   albuterol 108 (90 Base) MCG/ACT inhaler Commonly known as:  PROVENTIL HFA;VENTOLIN HFA Inhale 2 puffs into the lungs every 6 (six) hours as needed for wheezing or shortness of breath.   B-D ULTRAFINE III SHORT PEN 31G X 8 MM Misc Generic drug:  Insulin Pen Needle USE TWICE DAILY AS DIRECTED WITH LANTUS DX: 250.0 What changed:  Another medication with the same name was removed. Continue taking this medication, and follow the directions you see here.   BAYER MICROLET LANCETS lancets Use astwice daily to check blood sugar.  DX E11.9 BAYER CONTOUR NEXT EASY PER INSURANCE   cetirizine 10 MG tablet Commonly known as:  ZYRTEC Take 10 mg by mouth daily as needed for allergies.   CONTOUR BLOOD GLUCOSE SYSTEM Devi Use twice daily to check blood sugar.  DX E11.9   doxycycline 100 MG tablet Commonly known as:  VIBRA-TABS Take 1 tablet (100 mg total) by mouth 2 (two) times daily. Can give generic or capsules   fenofibrate 160 MG tablet TAKE 1 TABLET BY MOUTH EVERY DAY.Marland Kitchen MAX OF 30 DAYS ON INSURANCE   glucose blood test strip Commonly known as:  BAYER CONTOUR TEST Use twice daily to check blood sugar.  DX E11.9 BAYER CONTOUR NEXT EASY PER INSURANCE   ibuprofen 200 MG tablet Commonly known as:  ADVIL,MOTRIN Take 600 mg by mouth every 6 (six) hours as needed for pain.   Insulin Glargine 100 UNIT/ML Solostar Pen Commonly known as:  LANTUS SOLOSTAR INJECT 100 UNITS IN MORNING AND 60 UNITS IN EVENING   insulin lispro 100 UNIT/ML injection Commonly known as:  HUMALOG Inject 0.2 mLs (20 Units total) into the skin 3 (three) times daily before meals.   Krill Oil 300 MG Caps Take 1 capsule by mouth daily.   levothyroxine 75 MCG tablet Commonly known as:  SYNTHROID, LEVOTHROID TAKE 1 TABLET (75 MCG TOTAL) BY MOUTH DAILY BEFORE BREAKFAST.   losartan 50 MG tablet Commonly known as:  COZAAR TAKE 1 TABLET (50 MG TOTAL) BY MOUTH DAILY.   metFORMIN 500 MG tablet Commonly  known as:  GLUCOPHAGE TAKE 2 TABLETS BY MOUTH 2 TIMES A DAY.   mometasone 220 MCG/INH inhaler Commonly known as:  ASMANEX 60 METERED DOSES Inhale 1 puff into the lungs 2 (two) times daily.   mupirocin ointment 2 % Commonly known as:  BACTROBAN Apply to area twice a day   NON FORMULARY 1 each by Other route 4 (four) times daily. Accu-check multiclix lancets- use as directed   OSTEO BI-FLEX TRIPLE STRENGTH PO Take 2 tablets by mouth daily.   PROBIOTIC DAILY PO Take by mouth.   promethazine 12.5 MG tablet Commonly known as:  PHENERGAN Take 1 tablet (12.5 mg total) by mouth every 6 (six) hours as needed for nausea or vomiting.   ranitidine 300 MG tablet Commonly known as:  ZANTAC TAKE 1 TABLET (300 MG TOTAL) BY MOUTH AT BEDTIME.   rosuvastatin 40 MG tablet Commonly known as:  CRESTOR Take 1 tablet (40 mg total) by mouth daily.   Syringe (Disposable) 3 ML Misc DX: E11.9/E11.65  Use bid as directed   traMADol 50 MG tablet Commonly known as:  ULTRAM Take 1 tablet (50 mg  total) by mouth every 6 (six) hours as needed for severe pain. What changed:  when to take this  reasons to take this   vitamin C 1000 MG tablet Take 1,000 mg by mouth daily.      Follow-up Information    Melrose Nakayama, MD .   Specialty:  Cardiothoracic Surgery Why:  Appointment for 8/28 at 2:30pm. Please get chest xray at [facility] imaging on the first floor of our office at 2:00pm.  Contact information: [address] [place] [address] [phone]           Signed: Elgie Collard 11/20/2015, 8:11 AM

## 2015-11-20 ENCOUNTER — Inpatient Hospital Stay (HOSPITAL_COMMUNITY): Payer: BLUE CROSS/BLUE SHIELD

## 2015-11-20 LAB — GLUCOSE, CAPILLARY
GLUCOSE-CAPILLARY: 149 mg/dL — AB (ref 65–99)
Glucose-Capillary: 208 mg/dL — ABNORMAL HIGH (ref 65–99)

## 2015-11-20 MED ORDER — PROMETHAZINE HCL 12.5 MG PO TABS: 12.5000 mg | ORAL_TABLET | Freq: Four times a day (QID) | ORAL | 0 refills | Status: DC | PRN

## 2015-11-20 MED ORDER — TRAMADOL HCL 50 MG PO TABS: 50.0000 mg | ORAL_TABLET | Freq: Four times a day (QID) | ORAL | 0 refills | Status: DC | PRN

## 2015-11-20 NOTE — Progress Notes (Signed)
Steven Ibarra to be D/C'd Home per MD order. Discussed with the patient and all questions fully answered.    Medication List    TAKE these medications   albuterol 108 (90 Base) MCG/ACT inhaler Commonly known as:  PROVENTIL HFA;VENTOLIN HFA Inhale 2 puffs into the lungs every 6 (six) hours as needed for wheezing or shortness of breath.   B-D ULTRAFINE III SHORT PEN 31G X 8 MM Misc Generic drug:  Insulin Pen Needle USE TWICE DAILY AS DIRECTED WITH LANTUS DX: 250.0 What changed:  Another medication with the same name was removed. Continue taking this medication, and follow the directions you see here.   BAYER MICROLET LANCETS lancets Use astwice daily to check blood sugar.  DX E11.9 BAYER CONTOUR NEXT EASY PER INSURANCE   cetirizine 10 MG tablet Commonly known as:  ZYRTEC Take 10 mg by mouth daily as needed for allergies.   CONTOUR BLOOD GLUCOSE SYSTEM Devi Use twice daily to check blood sugar.  DX E11.9   doxycycline 100 MG tablet Commonly known as:  VIBRA-TABS Take 1 tablet (100 mg total) by mouth 2 (two) times daily. Can give generic or capsules   fenofibrate 160 MG tablet TAKE 1 TABLET BY MOUTH EVERY DAY.Marland Kitchen MAX OF 30 DAYS ON INSURANCE   glucose blood test strip Commonly known as:  BAYER CONTOUR TEST Use twice daily to check blood sugar.  DX E11.9 BAYER CONTOUR NEXT EASY PER INSURANCE   ibuprofen 200 MG tablet Commonly known as:  ADVIL,MOTRIN Take 600 mg by mouth every 6 (six) hours as needed for pain.   Insulin Glargine 100 UNIT/ML Solostar Pen Commonly known as:  LANTUS SOLOSTAR INJECT 100 UNITS IN MORNING AND 60 UNITS IN EVENING   insulin lispro 100 UNIT/ML injection Commonly known as:  HUMALOG Inject 0.2 mLs (20 Units total) into the skin 3 (three) times daily before meals.   Krill Oil 300 MG Caps Take 1 capsule by mouth daily.   levothyroxine 75 MCG tablet Commonly known as:  SYNTHROID, LEVOTHROID TAKE 1 TABLET (75 MCG TOTAL) BY MOUTH DAILY BEFORE  BREAKFAST.   losartan 50 MG tablet Commonly known as:  COZAAR TAKE 1 TABLET (50 MG TOTAL) BY MOUTH DAILY.   metFORMIN 500 MG tablet Commonly known as:  GLUCOPHAGE TAKE 2 TABLETS BY MOUTH 2 TIMES A DAY.   mometasone 220 MCG/INH inhaler Commonly known as:  ASMANEX 60 METERED DOSES Inhale 1 puff into the lungs 2 (two) times daily.   mupirocin ointment 2 % Commonly known as:  BACTROBAN Apply to area twice a day   NON FORMULARY 1 each by Other route 4 (four) times daily. Accu-check multiclix lancets- use as directed   OSTEO BI-FLEX TRIPLE STRENGTH PO Take 2 tablets by mouth daily.   PROBIOTIC DAILY PO Take by mouth.   promethazine 12.5 MG tablet Commonly known as:  PHENERGAN Take 1 tablet (12.5 mg total) by mouth every 6 (six) hours as needed for nausea or vomiting.   ranitidine 300 MG tablet Commonly known as:  ZANTAC TAKE 1 TABLET (300 MG TOTAL) BY MOUTH AT BEDTIME.   rosuvastatin 40 MG tablet Commonly known as:  CRESTOR Take 1 tablet (40 mg total) by mouth daily.   Syringe (Disposable) 3 ML Misc DX: E11.9/E11.65  Use bid as directed   traMADol 50 MG tablet Commonly known as:  ULTRAM Take 1 tablet (50 mg total) by mouth every 6 (six) hours as needed for severe pain. What changed:  when to take this  reasons to take this   vitamin C 1000 MG tablet Take 1,000 mg by mouth daily.       VVS, Skin clean, dry and intact without evidence of skin break down, no evidence of skin tears noted.  IV catheter discontinued intact. Site without signs and symptoms of complications. Dressing and pressure applied.  An After Visit Summary was printed and given to the patient.  Patient escorted via [place], and D/C home via private auto.  [name]  11/20/2015 2:25 PM

## 2015-11-20 NOTE — Progress Notes (Addendum)
       [address]       [place] [address]             [phone]         Subjective: No issues overnight. Pain much better controlled.   Objective: Vital signs in last 24 hours: Temp:  [97.4 F (36.3 C)-98.4 F (36.9 C)] 98.4 F (36.9 C) (08/23 0454) Pulse Rate:  [82-89] 82 (08/23 0454) Cardiac Rhythm: Normal sinus rhythm (08/22 2100) Resp:  [18-20] 20 (08/23 0454) BP: (119-141)/(73-85) 119/73 (08/23 0454) SpO2:  [92 %-95 %] 95 % (08/23 0454)     Intake/Output from previous day: 08/22 0701 - 08/23 0700 In: 840 [P.O.:840] Out: 300 [Urine:300] Intake/Output this shift: No intake/output data recorded.  General appearance: alert and cooperative Heart: regular rate and rhythm, S1, S2 normal, no murmur, click, rub or gallop Lungs: clear to auscultation bilaterally Abdomen: soft, non-tender; bowel sounds normal; no masses,  no organomegaly Extremities: edema minimal pedal edema Wound: c/d/i  Lab Results:  Recent Labs  11/19/15 0242  WBC 5.9  HGB 13.7  HCT 40.8  PLT 175   BMET:  Recent Labs  11/19/15 0242  NA 136  K 4.1  CL 103  CO2 28  GLUCOSE 261*  BUN 21*  CREATININE 1.15  CALCIUM 8.8*    PT/INR: No results for input(s): LABPROT, INR in the last 72 hours. ABG No results found for: PHART, HCO3, TCO2, ACIDBASEDEF, O2SAT CBG (last 3)   Recent Labs  11/19/15 1611 11/19/15 2153 11/20/15 0611  GLUCAP 298* 188* 149*    Assessment/Plan: S/P  Left CT for hydropneumothorax Chest tube removed yesterday without complication CXR this morning has a stable small left pneumothorax Patient is on room air without shortness of breath Blood glucose 149 this morning, will resume home insulin regimen at discharge F/u with Endocrinology, PCP, and Cardiothoracic surgery    LOS: 3 days    Steven Ibarra 11/20/2015  Patient seen and examined, agree with above CXR shows a tiny residual pneumo unchanged from yesterday Home today  Steven Ibarra. Roxan Hockey, MD [facility] [phone]

## 2015-11-22 ENCOUNTER — Other Ambulatory Visit: Payer: Self-pay | Admitting: Thoracic Surgery (Cardiothoracic Vascular Surgery)

## 2015-11-22 DIAGNOSIS — C7800 Secondary malignant neoplasm of unspecified lung: Secondary | ICD-10-CM

## 2015-11-25 ENCOUNTER — Ambulatory Visit (INDEPENDENT_AMBULATORY_CARE_PROVIDER_SITE_OTHER): Payer: BLUE CROSS/BLUE SHIELD | Admitting: Physician Assistant

## 2015-11-25 ENCOUNTER — Encounter: Payer: Self-pay | Admitting: Physician Assistant

## 2015-11-25 ENCOUNTER — Ambulatory Visit
Admission: RE | Admit: 2015-11-25 | Discharge: 2015-11-25 | Disposition: A | Discharge status: Home / Self Care | Payer: BLUE CROSS/BLUE SHIELD | Source: Ambulatory Visit | Attending: Thoracic Surgery (Cardiothoracic Vascular Surgery) | Admitting: Thoracic Surgery (Cardiothoracic Vascular Surgery)

## 2015-11-25 VITALS — BP 130/88 | HR 92 | Resp 20 | Ht 78.0 in | Wt 311.0 lb

## 2015-11-25 DIAGNOSIS — C7800 Secondary malignant neoplasm of unspecified lung: Secondary | ICD-10-CM

## 2015-11-25 DIAGNOSIS — J948 Other specified pleural conditions: Secondary | ICD-10-CM

## 2015-11-25 NOTE — Progress Notes (Signed)
HPI:  Patient returns for postoperative follow-up having undergone a left chest tube placement for a left hydropneumothorax on 11/17/2015.    Current Outpatient Prescriptions  Medication Sig Dispense Refill  . albuterol (PROVENTIL HFA;VENTOLIN HFA) 108 (90 Base) MCG/ACT inhaler Inhale 2 puffs into the lungs every 6 (six) hours as needed for wheezing or shortness of breath. 18 g 2  . Ascorbic Acid (VITAMIN C) 1000 MG tablet Take 1,000 mg by mouth daily.    . B-D ULTRAFINE III SHORT PEN 31G X 8 MM MISC USE TWICE DAILY AS DIRECTED WITH LANTUS DX: 250.0 100 each 5  . BAYER MICROLET LANCETS lancets Use astwice daily to check blood sugar.  DX E11.9 BAYER CONTOUR NEXT EASY PER INSURANCE 100 each 6  . Blood Glucose Monitoring Suppl (CONTOUR BLOOD GLUCOSE SYSTEM) DEVI Use twice daily to check blood sugar.  DX E11.9 1 Device 0  . cetirizine (ZYRTEC) 10 MG tablet Take 10 mg by mouth daily as needed for allergies.     Marland Kitchen doxycycline (VIBRA-TABS) 100 MG tablet Take 1 tablet (100 mg total) by mouth 2 (two) times daily. Can give generic or capsules 20 tablet 0  . fenofibrate 160 MG tablet TAKE 1 TABLET BY MOUTH EVERY DAY.Marland Kitchen MAX OF 30 DAYS ON INSURANCE 90 tablet 1  . glucose blood (BAYER CONTOUR TEST) test strip Use twice daily to check blood sugar.  DX E11.9 BAYER CONTOUR NEXT EASY PER INSURANCE 100 each 6  . ibuprofen (ADVIL,MOTRIN) 200 MG tablet Take 600 mg by mouth every 6 (six) hours as needed for pain.     . Insulin Glargine (LANTUS SOLOSTAR) 100 UNIT/ML Solostar Pen INJECT 100 UNITS IN MORNING AND 60 UNITS IN EVENING 60 mL 2  . insulin lispro (HUMALOG) 100 UNIT/ML injection Inject 0.2 mLs (20 Units total) into the skin 3 (three) times daily before meals. 30 mL 4  . Krill Oil 300 MG CAPS Take 1 capsule by mouth daily.    Marland Kitchen levothyroxine (SYNTHROID, LEVOTHROID) 75 MCG tablet TAKE 1 TABLET (75 MCG TOTAL) BY MOUTH DAILY BEFORE BREAKFAST. 30 tablet 2  . losartan (COZAAR) 50 MG tablet TAKE 1 TABLET (50 MG  TOTAL) BY MOUTH DAILY. 90 tablet 1  . metFORMIN (GLUCOPHAGE) 500 MG tablet TAKE 2 TABLETS BY MOUTH 2 TIMES A DAY. 360 tablet 1  . Misc Natural Products (OSTEO BI-FLEX TRIPLE STRENGTH PO) Take 2 tablets by mouth daily.    . mometasone (ASMANEX 60 METERED DOSES) 220 MCG/INH inhaler Inhale 1 puff into the lungs 2 (two) times daily. 1 Inhaler 5  . mupirocin ointment (BACTROBAN) 2 % Apply to area twice a day 22 g 0  . NON FORMULARY 1 each by Other route 4 (four) times daily. Accu-check multiclix lancets- use as directed    . Probiotic Product (PROBIOTIC DAILY PO) Take by mouth.    . promethazine (PHENERGAN) 12.5 MG tablet Take 1 tablet (12.5 mg total) by mouth every 6 (six) hours as needed for nausea or vomiting. 15 tablet 0  . ranitidine (ZANTAC) 300 MG tablet TAKE 1 TABLET (300 MG TOTAL) BY MOUTH AT BEDTIME. 90 tablet 1  . rosuvastatin (CRESTOR) 40 MG tablet Take 1 tablet (40 mg total) by mouth daily. 90 tablet 1  . Syringe, Disposable, 3 ML MISC DX: E11.9/E11.65  Use bid as directed 100 each 5  . traMADol (ULTRAM) 50 MG tablet Take 1 tablet (50 mg total) by mouth every 6 (six) hours as needed for severe pain. 20 tablet 0  Vital Signs: BP 130/88, RR 20, Oxygenation 95% on room air   Physical Exam: CV-RRR Pulmonary-Slightly diminished left base Wounds-Chest tube site has 2 silk "air" sutures. These sutures were removed.  Diagnostic Tests: EXAM: CHEST  2 VIEW  COMPARISON:  PA and lateral chest x-ray of November 20, 2015.  FINDINGS: A tiny left apical pneumothorax is faintly visible. It is less conspicuous than on the previous study. There remains a small left pleural effusion. Left lower lobe atelectasis or pneumonia is suspected but not greatly changed. There is bilateral perihilar subsegmental atelectasis. The heart is normal in size. The pulmonary vascularity is not engorged.  IMPRESSION: Slight interval decrease in pulmonary interstitial edema. Persistent left lower lobe  atelectasis or pneumonia with small left pleural effusion. Tiny left apical pneumothorax persists. Stable bilateral perihilar subsegmental atelectasis.  Impression and Plan: Steven Ibarra has a tiny left apical pneumothorax (decreased from prior CXR). He was instructed to continue with lifting precautions (i.e. No more than 10 pounds until instructed otherwise) and to NOT perform any strenuous physical activity. He is still taking an occasional "pain pill" so he was instructed to not drive until not taking any. Finally, I instructed him to make sure he makes a follow up appointment with his medical doctor regarding his diabetes management. He will return to see Dr. Roxan Hockey in 2-3 weeks with another chest x ray.    Steven Skillern, PA-C [facility] [phone]

## 2015-12-06 IMAGING — CT CT BIOPSY
2 of 4 series · 12 of 32 positions shown, 17 images · non-contrast
Comparison: none

CLINICAL DATA: 58-year-old male with a history of multiple lung
nodules. He has been referred for percutaneous biopsy.

[Series 4: i-spiral 5.0 b40f · axial · 0.63mm/px · z∈[-243,-96]mm · 8 of 55 slices shown, 13 images (1 of 2)]
[im 7/55  soft-tissue]
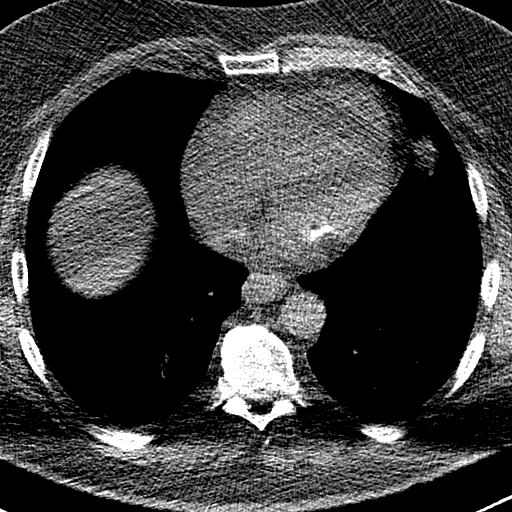
[im 7/55  bone]
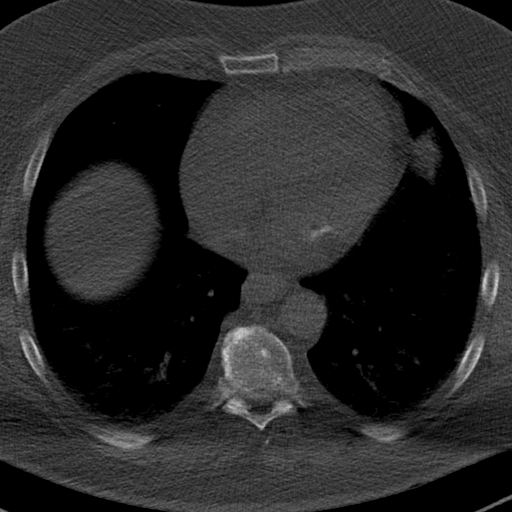
[im 13/55  soft-tissue]
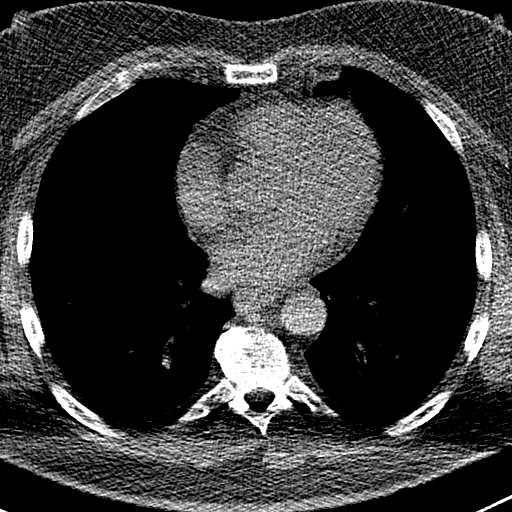
[im 19/55  soft-tissue]
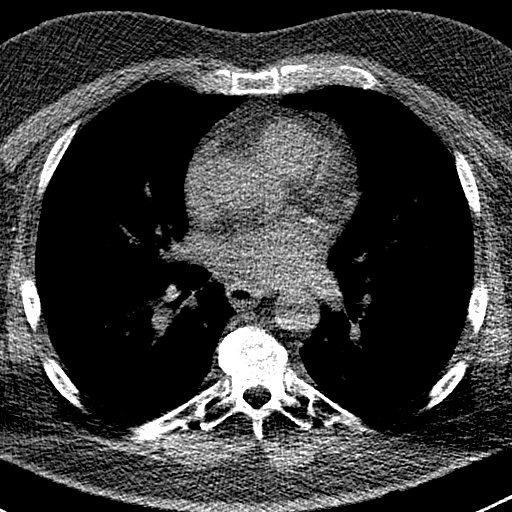
[im 25/55  soft-tissue]
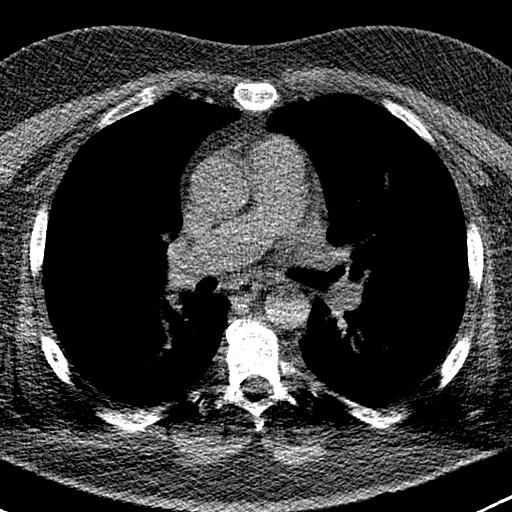
[im 31/55  soft-tissue]
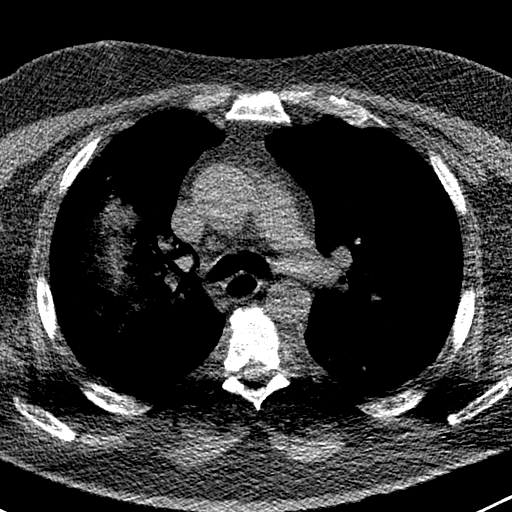
[im 31/55  lung]
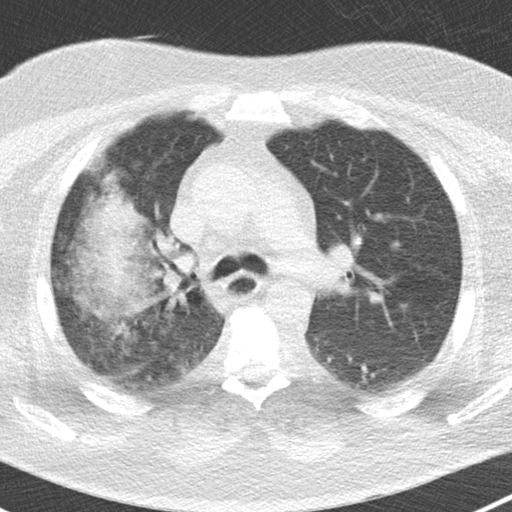
[im 37/55  soft-tissue]
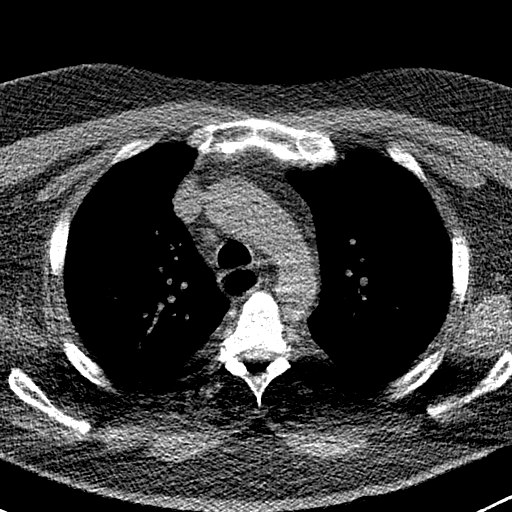
[im 37/55  lung]
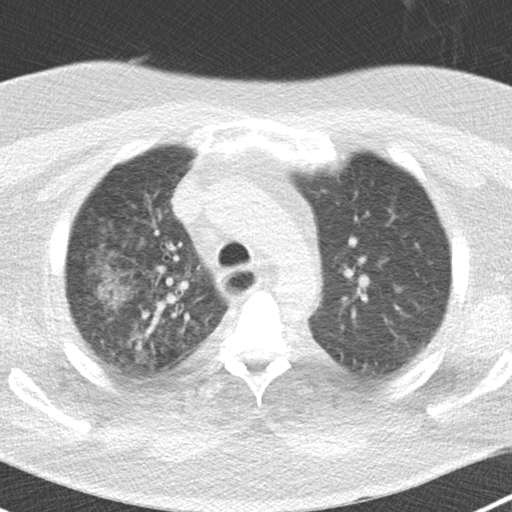
[im 43/55  soft-tissue]
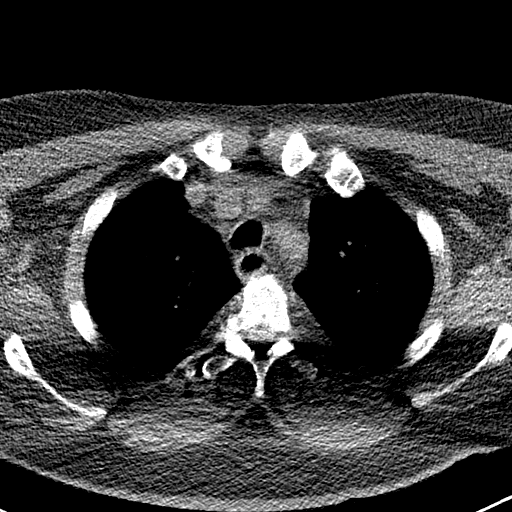
[im 43/55  lung]
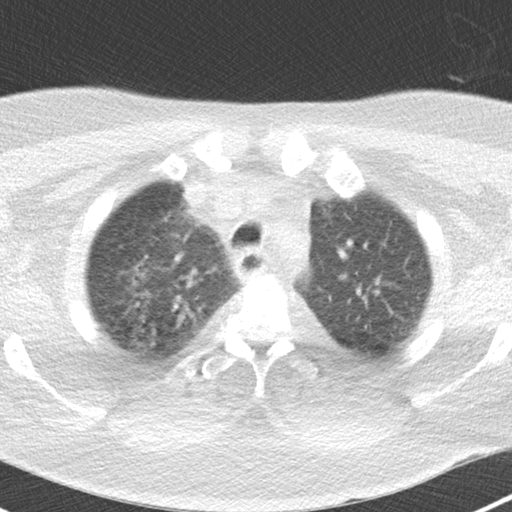
[im 49/55  soft-tissue]
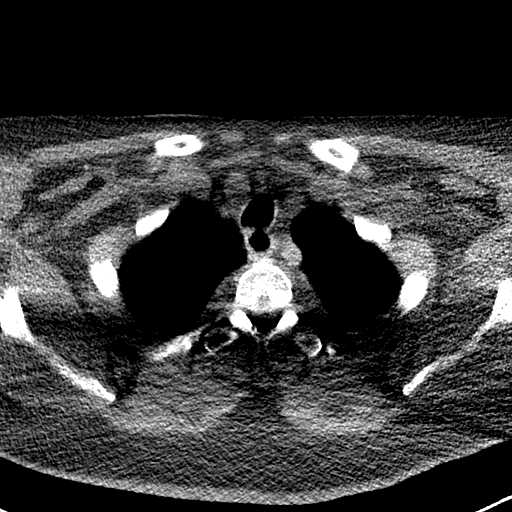
[im 49/55  lung]
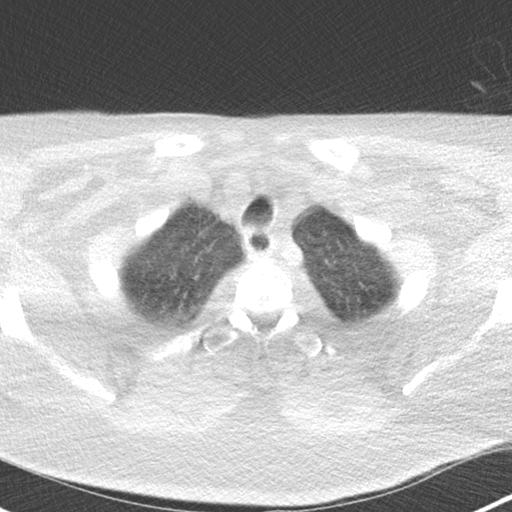

[Series 5: i-spiral 5.0 b40f · axial · 0.63mm/px · z∈[-243,-180]mm · 4 of 55 slices shown (2 of 2)]
[im 7/55  soft-tissue]
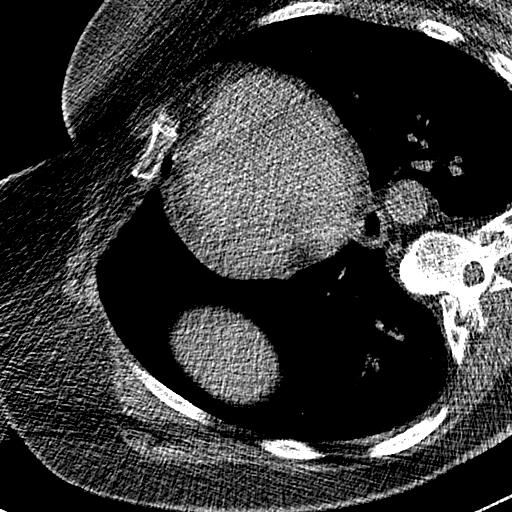
[im 13/55  soft-tissue]
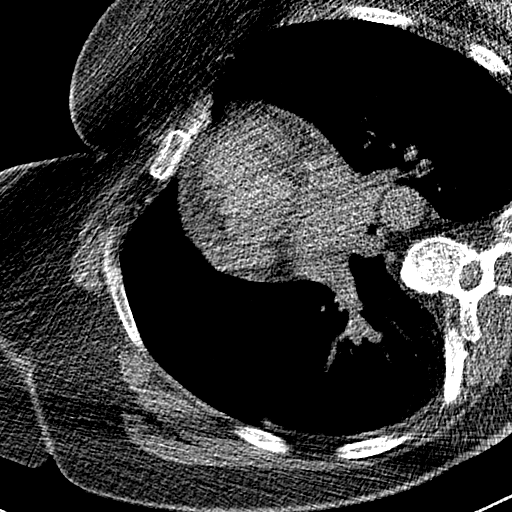
[im 19/55  soft-tissue]
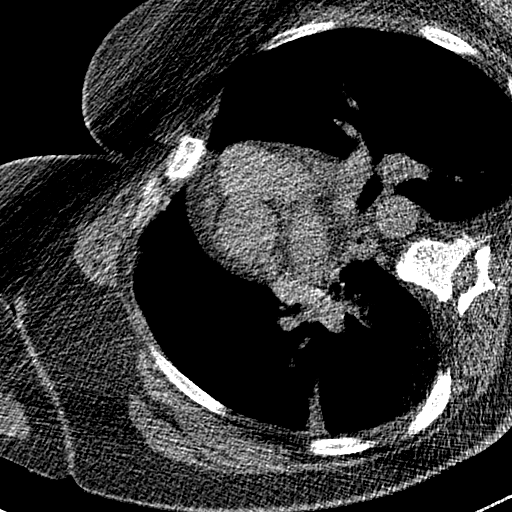
[im 25/55  soft-tissue]
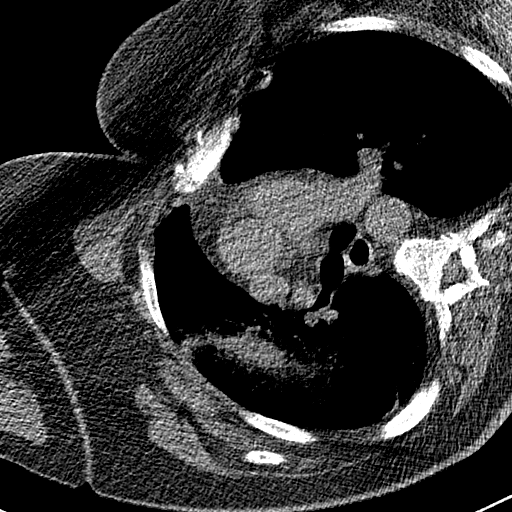

[12 of 32 positions shown; findings below may reference images not displayed]

EXAM:
CT GUIDED CORE BIOPSY OF RIGHT UPPER LOBE LUNG NODULE

ANESTHESIA/SEDATION:
2.0  Mg IV Versed; 50 mcg IV Fentanyl

Total Moderate Sedation Time: 15 minutes.

PROCEDURE:
The procedure risks, benefits, and alternatives were explained to
the patient. Questions regarding the procedure were encouraged and
answered. The patient understands and consents to the procedure.

Patient was placed in the supine position and CT scan of the chest
was performed for planning purposes. Right upper lobe nodule was
identified is the safest lesion for biopsy.

The right anterior chest was prepped with Betadinein a sterile
fashion, and a sterile drape was applied covering the operative
field. A sterile gown and sterile gloves were used for the
procedure. Local anesthesia was provided with 1% Lidocaine.

Once the patient is prepped and draped sterilely and the skin was
infiltrated with 1% lidocaine for local anesthesia. A 17 gauge
trocar needle was advanced in intercostal location to the right
upper lobe nodule. The tip of the needle was identified on the
proximal margin of the lesion with CT imaging.

Two separate 18 core biopsy were retrieved.

At this point, the patient developed hemoptysis. He was rapidly
placed into a right decubitus position, and nasal cannulated was
exchanged for a face mask with 15 L of oxygen.

A respiratory code was called, with assistance from critical care,
anesthesia, and a rapid response team.

Sequential CT scans after the biopsy were performed in the supine
position and also in right decubitus.

After a hemodynamics stable is a shin, the patient remained
tachycardic at 104 beats per min. The patient was oxygen eating with
a face massive 15 L approximately 95%. Blood pressure remained at
150 systolic. The patient was alert and appropriately answering
questions.

Few transported from the CT scan of to the ICU for observation.

No significant blood loss was encountered.

Complications:

COMPLICATIONS:
Hemoptysis with right pulmonary hemorrhage. Society of
complication, and given his admission to the ICU for observation.
FINDINGS: Initial CT in supine position demonstrates right upper lobe nodule
measuring 17 mm, similar to the comparison CT and PET-CT.

After 2 separate 18 gauge biopsy, right upper lobe pulmonary
hemorrhage is identified. No pneumothorax.

A sequential CT scan in the right decubitus position approximately 4
min after the first CT demonstrates similar volume of right upper
lobe hemorrhage. The right decubitus study demonstrates trace
pneumothorax in the in the dependent portion of the right thorax.
IMPRESSION: Status post right upper lobe nodule percutaneous biopsy, with tissue
specimen sent to pathology for complete histopathologic analysis.

Right-sided pulmonary hemorrhage with tiny right-sided pneumothorax.

PLAN:
The patient was stabilized in the CT scanner be for transportation
to the ICU for observation.

The ICU will be managing the patient's respiratory status, and at
the time of transfer there was no need for intubation.

The need for follow-up chest x-ray was discussed given the risk of a
growing pneumothorax.

## 2015-12-09 ENCOUNTER — Other Ambulatory Visit: Payer: Self-pay | Admitting: Family Medicine

## 2015-12-09 MED ORDER — METFORMIN HCL 500 MG PO TABS: ORAL_TABLET | ORAL | 1 refills | Status: DC

## 2015-12-16 ENCOUNTER — Other Ambulatory Visit: Payer: Self-pay | Admitting: Thoracic Surgery (Cardiothoracic Vascular Surgery)

## 2015-12-16 DIAGNOSIS — J948 Other specified pleural conditions: Secondary | ICD-10-CM

## 2015-12-17 ENCOUNTER — Ambulatory Visit (HOSPITAL_COMMUNITY)
Admission: RE | Admit: 2015-12-17 | Discharge: 2015-12-17 | Disposition: A | Discharge status: Home / Self Care | Payer: BLUE CROSS/BLUE SHIELD | Source: Ambulatory Visit | Attending: Radiation Oncology | Admitting: Radiation Oncology

## 2015-12-17 ENCOUNTER — Encounter: Payer: Self-pay | Admitting: Thoracic Surgery (Cardiothoracic Vascular Surgery)

## 2015-12-17 ENCOUNTER — Ambulatory Visit (INDEPENDENT_AMBULATORY_CARE_PROVIDER_SITE_OTHER): Payer: BLUE CROSS/BLUE SHIELD | Admitting: Thoracic Surgery (Cardiothoracic Vascular Surgery)

## 2015-12-17 VITALS — BP 143/90 | HR 95 | Resp 16 | Ht 78.0 in | Wt 301.0 lb

## 2015-12-17 DIAGNOSIS — J948 Other specified pleural conditions: Secondary | ICD-10-CM

## 2015-12-17 DIAGNOSIS — C801 Malignant (primary) neoplasm, unspecified: Secondary | ICD-10-CM | POA: Diagnosis not present

## 2015-12-17 DIAGNOSIS — Z923 Personal history of irradiation: Secondary | ICD-10-CM | POA: Insufficient documentation

## 2015-12-17 DIAGNOSIS — R918 Other nonspecific abnormal finding of lung field: Secondary | ICD-10-CM | POA: Diagnosis not present

## 2015-12-17 DIAGNOSIS — I2510 Atherosclerotic heart disease of native coronary artery without angina pectoris: Secondary | ICD-10-CM | POA: Diagnosis not present

## 2015-12-17 DIAGNOSIS — J90 Pleural effusion, not elsewhere classified: Secondary | ICD-10-CM | POA: Insufficient documentation

## 2015-12-17 DIAGNOSIS — I700 Atherosclerosis of aorta: Secondary | ICD-10-CM | POA: Insufficient documentation

## 2015-12-17 DIAGNOSIS — C7802 Secondary malignant neoplasm of left lung: Secondary | ICD-10-CM | POA: Diagnosis not present

## 2015-12-17 DIAGNOSIS — C7801 Secondary malignant neoplasm of right lung: Secondary | ICD-10-CM

## 2015-12-17 NOTE — Progress Notes (Signed)
[address]       [place] [address]             [phone]       HPI: [name] returns for follow-up after being treated for a left hydropneumothorax.   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 He presented with chest pain and shortness of breath on 11/17/2015. Chest x-ray showed a left hydropneumothorax. I placed a chest tube. We were able to remove the tube on 11/19/2015 and he was discharged the following day.  He is not having any pain at the chest tube site. He has not had any acute changes in his breathing since discharge. He has had some left flank pain. He hasn't had any fevers or chills. He was supposed to do a chest x-ray today but already had a CT scheduled by Dr. Isidore Moos so he went ahead and did that.  Past Medical History:  Diagnosis Date  . ALLERGIC RHINITIS, SEASONAL 10/23/2009  . Basal cell carcinoma of neck 05/28/2011   r suprahyoid, radical neck dissection S/p 6 weeks of targeted radiation therapy   . Cancer ([place])   . Costochondritis 09/16/2015  . Diabetes mellitus approx 2003   type 2  . DIABETES MELLITUS, TYPE II 10/23/2009  . DYSPNEA ON EXERTION 10/23/2009  . ELEVATED BLOOD PRESSURE 04/23/2010  . GERD (gastroesophageal reflux disease)   . Hearing loss   . Hyperlipidemia   . Hypertension    Does not see a cardiologist, has not had a stress, echo   . Hypothyroid 01/28/2012  . Lung cancer ([facility]) 03/08/14   invasive squamous cell carcinoma  . Mixed hyperlipidemia 10/23/2009  . Obesity   . OSA (obstructive sleep apnea)   . Otitis externa of right ear 07/06/2012  . Overweight(278.02) 10/23/2009  . Preventative health care 09/30/2011  . Pulled muscle 07/06/12  . S/P radiation therapy 07/06/11 - 08/19/11   Right Facial and Right Neck Nodes and right Trigeminal  Nerve to Base of Skull/ Total Dose 6600 cGy/ 33 Fractions  . Skin cancer    on nose, 9-10 yrs ago  . SLEEP  APNEA, OBSTRUCTIVE 10/23/2009   [facility]  . SOB (shortness of breath) 09/16/2015  . Type II or unspecified type diabetes mellitus with unspecified complication, uncontrolled    type 2     Current Outpatient Prescriptions  Medication Sig Dispense Refill  . albuterol (PROVENTIL HFA;VENTOLIN HFA) 108 (90 Base) MCG/ACT inhaler Inhale 2 puffs into the lungs every 6 (six) hours as needed for wheezing or shortness of breath. 18 g 2  . Ascorbic Acid (VITAMIN C) 1000 MG tablet Take 1,000 mg by mouth daily.    . B-D ULTRAFINE III SHORT PEN 31G X 8 MM MISC USE TWICE DAILY AS DIRECTED WITH LANTUS DX: 250.0 100 each 5  . BAYER MICROLET LANCETS lancets Use astwice daily to check blood sugar.  DX E11.9 BAYER CONTOUR NEXT EASY PER INSURANCE 100 each 6  . Blood Glucose Monitoring Suppl (CONTOUR BLOOD GLUCOSE SYSTEM) DEVI Use twice daily to check blood sugar.  DX E11.9 1 Device 0  . cetirizine (ZYRTEC) 10 MG tablet Take 10 mg by mouth daily as needed for allergies.     Marland Kitchen doxycycline (VIBRA-TABS) 100 MG tablet Take 1 tablet (100 mg total) by mouth 2 (two) times daily. Can give generic or capsules 20 tablet 0  .  fenofibrate 160 MG tablet TAKE 1 TABLET BY MOUTH EVERY DAY.Marland Kitchen MAX OF 30 DAYS ON INSURANCE 90 tablet 1  . glucose blood (BAYER CONTOUR TEST) test strip Use twice daily to check blood sugar.  DX E11.9 BAYER CONTOUR NEXT EASY PER INSURANCE 100 each 6  . ibuprofen (ADVIL,MOTRIN) 200 MG tablet Take 600 mg by mouth every 6 (six) hours as needed for pain.     . Insulin Glargine (LANTUS SOLOSTAR) 100 UNIT/ML Solostar Pen INJECT 100 UNITS IN MORNING AND 60 UNITS IN EVENING 60 mL 2  . insulin lispro (HUMALOG) 100 UNIT/ML injection Inject 0.2 mLs (20 Units total) into the skin 3 (three) times daily before meals. 30 mL 4  . Krill Oil 300 MG CAPS Take 1 capsule by mouth daily.    Marland Kitchen levothyroxine (SYNTHROID, LEVOTHROID) 75 MCG tablet TAKE 1 TABLET (75 MCG TOTAL) BY MOUTH DAILY BEFORE BREAKFAST. 30  tablet 2  . losartan (COZAAR) 50 MG tablet TAKE 1 TABLET (50 MG TOTAL) BY MOUTH DAILY. 90 tablet 1  . metFORMIN (GLUCOPHAGE) 500 MG tablet TAKE 2 TABLETS BY MOUTH 2 TIMES A DAY. 120 tablet 1  . Misc Natural Products (OSTEO BI-FLEX TRIPLE STRENGTH PO) Take 2 tablets by mouth daily.    . mometasone (ASMANEX 60 METERED DOSES) 220 MCG/INH inhaler Inhale 1 puff into the lungs 2 (two) times daily. 1 Inhaler 5  . mupirocin ointment (BACTROBAN) 2 % Apply to area twice a day 22 g 0  . NON FORMULARY 1 each by Other route 4 (four) times daily. Accu-check multiclix lancets- use as directed    . Probiotic Product (PROBIOTIC DAILY PO) Take by mouth.    . promethazine (PHENERGAN) 12.5 MG tablet Take 1 tablet (12.5 mg total) by mouth every 6 (six) hours as needed for nausea or vomiting. 15 tablet 0  . ranitidine (ZANTAC) 300 MG tablet TAKE 1 TABLET (300 MG TOTAL) BY MOUTH AT BEDTIME. 90 tablet 1  . rosuvastatin (CRESTOR) 40 MG tablet Take 1 tablet (40 mg total) by mouth daily. 90 tablet 1  . Syringe, Disposable, 3 ML MISC DX: E11.9/E11.65  Use bid as directed 100 each 5  . traMADol (ULTRAM) 50 MG tablet Take 1 tablet (50 mg total) by mouth every 6 (six) hours as needed for severe pain. (Patient not taking: Reported on 12/17/2015) 20 tablet 0   No current facility-administered medications for this visit.     Physical Exam BP (!) 143/90   Pulse 95   Resp 16   Ht '6\' 6"'$  (1.981 m)   Wt (!) 301 lb (136.5 kg)   SpO2 97% Comment: RA  BMI 34.78 kg/m  Obese 60 year old man in no acute distress Alert and oriented 3 no focal deficits Chest tube site with minimal erythema Lungs with equal breath sounds bilaterally  Diagnostic Tests: CT CHEST WITHOUT CONTRAST  TECHNIQUE: Multidetector CT imaging of the chest was performed following the standard protocol without IV contrast.  COMPARISON:  PET-CT from 08/15/2015  FINDINGS: Cardiovascular: Normal heart size. Aortic atherosclerosis. Calcification within  the LAD and left circumflex coronary artery noted.  Mediastinum/Nodes: The trachea appears patent and is midline. Normal appearance of the esophagus. No enlarged mediastinal or hilar lymph nodes.  Lungs/Pleura: New small loculated left pleural effusion identified. Left lower lobe scar like density is similar to previous exam compatible with changes of external beam radiation. Nodular density in the anterior left base is identified and appears more conspicuous than on previous exam. This currently measures 1.5 cm,  image 108 of series 7. Previously 1.2 cm. New subpleural nodule in the posterior left base measures 9 mm, image 123 of series 7. Changes of external beam radiation are identified within the right upper lobe. Index lesion in the right upper lobe measures 2.1 x 1.7 cm, image 53 of series 7. Previously 2.3 x 2.4 cm.  Upper Abdomen: No acute findings identified within the upper abdomen. Right adrenal nodule measures 1.5 cm, image 156 of series 2. Unchanged from previous exam.  Musculoskeletal: Spondylosis noted within the thoracic spine. No aggressive lytic or sclerotic bone lesion.  IMPRESSION: 1. Again noted are changes of external beam radiation within the right upper lobe and left lower lobe. 2. New loculated left pleural effusion. 3. Index lesion within the right upper lobe is slightly decreased in size from previous exam. 4. Nodular density in the anterior left base is more conspicuous than on previous exam. The appearance is nonspecific but warrants attention on follow-up imaging. There is a new subpleural nodule within the posterior left lung base. Attention to this area on follow-up imaging is also recommended. If these nodules persist or have increased in size consider further investigation with PET-CT. 5. Aortic atherosclerosis and coronary artery calcification.   Electronically Signed   By: Kerby Moors M.D.   On: 12/17/2015 10:07 I personally  reviewed the CT chest for with findings as noted above.  Impression: Steven Ibarra is a 60 year old gentleman who presented with a spontaneous left hydropneumothorax on 11/17/2015. This was treated with a chest tube. His air leak was minimal and it resolved quickly. This tube was removed and he was discharged on day 3.  He is not having any residual pain related to the chest tube.  His CT today showed no residual pneumothorax. There is a small left pleural effusion. There also is some nodular density in the anterior left base, I will defer to Dr. Isidore Moos as to whether that needs any additional evaluation.  Plan: No restrictions on activities.  I will be happy to see him back in the future at any time if I can be of any further assistance with his care.  Melrose Nakayama, MD [facility] [phone]

## 2015-12-18 ENCOUNTER — Ambulatory Visit
Admission: RE | Admit: 2015-12-18 | Discharge: 2015-12-18 | Disposition: A | Discharge status: Home / Self Care | Payer: BLUE CROSS/BLUE SHIELD | Source: Ambulatory Visit | Attending: Radiation Oncology | Admitting: Radiation Oncology

## 2015-12-18 ENCOUNTER — Encounter: Payer: Self-pay | Admitting: Radiation Oncology

## 2015-12-18 VITALS — BP 145/91 | HR 96 | Temp 97.8°F | Ht 78.0 in | Wt 314.2 lb

## 2015-12-18 DIAGNOSIS — C7801 Secondary malignant neoplasm of right lung: Secondary | ICD-10-CM | POA: Diagnosis not present

## 2015-12-18 DIAGNOSIS — C7800 Secondary malignant neoplasm of unspecified lung: Secondary | ICD-10-CM

## 2015-12-18 DIAGNOSIS — C4441 Basal cell carcinoma of skin of scalp and neck: Secondary | ICD-10-CM | POA: Insufficient documentation

## 2015-12-18 DIAGNOSIS — J918 Pleural effusion in other conditions classified elsewhere: Secondary | ICD-10-CM | POA: Diagnosis not present

## 2015-12-18 DIAGNOSIS — Z923 Personal history of irradiation: Secondary | ICD-10-CM | POA: Diagnosis not present

## 2015-12-18 DIAGNOSIS — C792 Secondary malignant neoplasm of skin: Secondary | ICD-10-CM | POA: Diagnosis not present

## 2015-12-18 DIAGNOSIS — C7802 Secondary malignant neoplasm of left lung: Secondary | ICD-10-CM | POA: Insufficient documentation

## 2015-12-18 NOTE — Addendum Note (Signed)
Encounter addended by: Ernst Spell, RN on: 12/18/2015  1:39 PM<BR>    Actions taken: Charge Capture section accepted

## 2015-12-18 NOTE — Progress Notes (Signed)
Radiation Oncology         ([phone]) [phone] ________________________________  Name: Steven Ibarra MRN: [id number]  Date: 12/18/2015  DOB: 11/18/1955  Follow-Up Visit Note  Outpatient  CC: Penni Homans, MD  Melissa Montane, MD  Diagnosis and Prior Radiotherapy:     ICD-9-CM ICD-10-CM   1. Malignant neoplasm metastatic to lung, unspecified laterality ([place]) 197.0 C78.00    Diagnosis:   Basal Cell Carcinoma of the skin with squamous differentiation, Metastatic to the Submandibular Lymph Node, Right Neck -- now with bilateral lung metastases (Stage IV)  Prior Radiotherapy to H+N region: 07/06/11 to 08/19/11: 66 Gy in 33 fractions to the right facial nodes through the right neck and right trigeminal nerve to base of skull  LUNG metastases history:  Radiation treatment dates:   05/23/2014, 05/25/2014, 05/28/2014, 05/30/2014, 06/01/2014 Site/dose:    1) Right upper lung mass / 54 Gy in 3 fractions 2) Left lower lung mass and adjacent nodules / 50 Gy in 5 fractions   Radiation treatment dates:   02/11/2015-02/20/2015 Site/dose: 1) Right lung,  superior upper lobe / 50 Gy in 5 fractions 2) Left lung, posterior lower lobe / 50 Gy in 5 fractions   Narrative:  The patient returns today for routine follow-up. In mid-August, the patient was admitted to the hospital from the ED for a left hydropneumothorax. CT scan of the chest w/o contrast on 12/17/15 showed no residual pneumothorax, a small left pleural effusion, a nodular density in the anterior left base that measures 1.5 cm from 1.2 cm, and a new subpleural nodule in the posterior left base measuring 0.9 cm.  He denies chest pain. The Left lower chest pain went away after his effusion was drained. He is recovering from his chest tube placement in mid-August and still has some weakness. His shortness of breath has been improving since his pneumothorax. He has a congested cough in the morning and it improves as the day progresses. He has some white sputum  production in the morning. He was taken off all restriction yesterday by Dr. Roxan Hockey and plans to gradually increase his level of activity.  Dr Roxan Hockey and I communicated - he reports that the pleural fluid was not sent to cytology.  ALLERGIES:  has No Known Allergies.  Meds: Current Outpatient Prescriptions  Medication Sig Dispense Refill  . albuterol (PROVENTIL HFA;VENTOLIN HFA) 108 (90 Base) MCG/ACT inhaler Inhale 2 puffs into the lungs every 6 (six) hours as needed for wheezing or shortness of breath. 18 g 2  . Ascorbic Acid (VITAMIN C) 1000 MG tablet Take 1,000 mg by mouth daily.    . B-D ULTRAFINE III SHORT PEN 31G X 8 MM MISC USE TWICE DAILY AS DIRECTED WITH LANTUS DX: 250.0 100 each 5  . BAYER MICROLET LANCETS lancets Use astwice daily to check blood sugar.  DX E11.9 BAYER CONTOUR NEXT EASY PER INSURANCE 100 each 6  . Blood Glucose Monitoring Suppl (CONTOUR BLOOD GLUCOSE SYSTEM) DEVI Use twice daily to check blood sugar.  DX E11.9 1 Device 0  . cetirizine (ZYRTEC) 10 MG tablet Take 10 mg by mouth daily as needed for allergies.     . fenofibrate 160 MG tablet TAKE 1 TABLET BY MOUTH EVERY DAY.Marland Kitchen MAX OF 30 DAYS ON INSURANCE 90 tablet 1  . glucose blood (BAYER CONTOUR TEST) test strip Use twice daily to check blood sugar.  DX E11.9 BAYER CONTOUR NEXT EASY PER INSURANCE 100 each 6  . ibuprofen (ADVIL,MOTRIN) 200 MG tablet Take 600  mg by mouth every 6 (six) hours as needed for pain.     . Insulin Glargine (LANTUS SOLOSTAR) 100 UNIT/ML Solostar Pen INJECT 100 UNITS IN MORNING AND 60 UNITS IN EVENING 60 mL 2  . insulin lispro (HUMALOG) 100 UNIT/ML injection Inject 0.2 mLs (20 Units total) into the skin 3 (three) times daily before meals. 30 mL 4  . Krill Oil 300 MG CAPS Take 1 capsule by mouth daily.    Marland Kitchen levothyroxine (SYNTHROID, LEVOTHROID) 75 MCG tablet TAKE 1 TABLET (75 MCG TOTAL) BY MOUTH DAILY BEFORE BREAKFAST. 30 tablet 2  . losartan (COZAAR) 50 MG tablet TAKE 1 TABLET (50 MG  TOTAL) BY MOUTH DAILY. 90 tablet 1  . metFORMIN (GLUCOPHAGE) 500 MG tablet TAKE 2 TABLETS BY MOUTH 2 TIMES A DAY. 120 tablet 1  . Misc Natural Products (OSTEO BI-FLEX TRIPLE STRENGTH PO) Take 2 tablets by mouth daily.    . mometasone (ASMANEX 60 METERED DOSES) 220 MCG/INH inhaler Inhale 1 puff into the lungs 2 (two) times daily. 1 Inhaler 5  . NON FORMULARY 1 each by Other route 4 (four) times daily. Accu-check multiclix lancets- use as directed    . Probiotic Product (PROBIOTIC DAILY PO) Take by mouth.    . ranitidine (ZANTAC) 300 MG tablet TAKE 1 TABLET (300 MG TOTAL) BY MOUTH AT BEDTIME. 90 tablet 1  . rosuvastatin (CRESTOR) 40 MG tablet Take 1 tablet (40 mg total) by mouth daily. 90 tablet 1  . Syringe, Disposable, 3 ML MISC DX: E11.9/E11.65  Use bid as directed 100 each 5  . promethazine (PHENERGAN) 12.5 MG tablet Take 1 tablet (12.5 mg total) by mouth every 6 (six) hours as needed for nausea or vomiting. (Patient not taking: Reported on 12/18/2015) 15 tablet 0  . traMADol (ULTRAM) 50 MG tablet Take 1 tablet (50 mg total) by mouth every 6 (six) hours as needed for severe pain. (Patient not taking: Reported on 12/18/2015) 20 tablet 0   No current facility-administered medications for this encounter.    Physical Findings: The patient is in no acute distress. Patient is alert and oriented.  height is '6\' 6"'$  (1.981 m) and weight is 314 lb 3.2 oz (142.5 kg) (abnormal). His temperature is 97.8 F (36.6 C). His blood pressure is 145/91 (abnormal) and his pulse is 96. His oxygen saturation is 96%.  Oral cavity/oropharynx clear without lesions. Lungs are clear to auscultation bilaterally. Heart- regular rate and rhythm with no murmurs.  No palpable cervical or supraclavicular adenopathy.   He is healing well in the left lower chest where his previous chest tube was. Ambulatory. Some slightly pitting edema in the ankles.  Lab Findings: Lab Results  Component Value Date   WBC 5.9 11/19/2015    HGB 13.7 11/19/2015   HCT 40.8 11/19/2015   MCV 89.9 11/19/2015   PLT 175 11/19/2015   Lab Results  Component Value Date   TSH 5.21 (H) 09/10/2015   CMP     Component Value Date/Time   NA 136 11/19/2015 0242   K 4.1 11/19/2015 0242   CL 103 11/19/2015 0242   CO2 28 11/19/2015 0242   GLUCOSE 261 (H) 11/19/2015 0242   BUN 21 (H) 11/19/2015 0242   BUN 18.9 01/10/2015 0821   CREATININE 1.15 11/19/2015 0242   CREATININE 1.1 01/10/2015 0821   CALCIUM 8.8 (L) 11/19/2015 0242   PROT 6.8 02/28/2015 0826   ALBUMIN 4.1 02/28/2015 0826   AST 15 02/28/2015 0826   ALT 20 02/28/2015 0826  ALKPHOS 43 02/28/2015 0826   BILITOT 0.6 02/28/2015 0826   GFRNONAA >60 11/19/2015 0242   GFRAA >60 11/19/2015 0242     Radiographic Findings: Dg Chest 2 View  Result Date: 11/25/2015 CLINICAL DATA:  Status post left-sided chest tube removal. Recent hydro pneumothorax. Lung malignancy. EXAM: CHEST  2 VIEW COMPARISON:  PA and lateral chest x-ray of November 20, 2015. FINDINGS: A tiny left apical pneumothorax is faintly visible. It is less conspicuous than on the previous study. There remains a small left pleural effusion. Left lower lobe atelectasis or pneumonia is suspected but not greatly changed. There is bilateral perihilar subsegmental atelectasis. The heart is normal in size. The pulmonary vascularity is not engorged. IMPRESSION: Slight interval decrease in pulmonary interstitial edema. Persistent left lower lobe atelectasis or pneumonia with small left pleural effusion. Tiny left apical pneumothorax persists. Stable bilateral perihilar subsegmental atelectasis. Electronically Signed   By: David  Martinique M.D.   On: 11/25/2015 14:31   Dg Chest 2 View  Result Date: 11/20/2015 CLINICAL DATA:  Pneumothorax, shortness of breath EXAM: CHEST  2 VIEW COMPARISON:  Portable chest x-ray of November 19, 2015 FINDINGS: There there is a stable approximately 5% left apical pneumothorax. Atelectasis at the left lung base  persists with probable small left pleural effusion. On the right mild interstitial prominence has decreased. The cardiac silhouette is mildly enlarged. The pulmonary vascularity is normal. There is multilevel degenerative disc disease of the thoracic spine. IMPRESSION: Stable 5% left apical pneumothorax. Persistent left lower lobe atelectasis or pneumonia with small left pleural effusion. Decreased interstitial prominence on the right. Stable mild cardiomegaly without pulmonary vascular congestion. Electronically Signed   By: David  Martinique M.D.   On: 11/20/2015 07:38   Dg Chest 2 View  Result Date: 11/19/2015 CLINICAL DATA:  Left-sided pneumothorax and chest tube EXAM: CHEST  2 VIEW COMPARISON:  11/18/2015 FINDINGS: Cardiac shadow is stable. Left basilar atelectasis is again noted. A a small pneumothorax is noted in the left apex stable from the prior exam. Left chest tube remains in place. No new focal abnormality is seen. IMPRESSION: Small stable left pneumothorax with chest tube in place. Mild left basilar atelectasis. Electronically Signed   By: Inez Catalina M.D.   On: 11/19/2015 07:45   Ct Chest Wo Contrast  Result Date: 12/17/2015 CLINICAL DATA:  Bilateral lung cancer, follow-up. EXAM: CT CHEST WITHOUT CONTRAST TECHNIQUE: Multidetector CT imaging of the chest was performed following the standard protocol without IV contrast. COMPARISON:  PET-CT from 08/15/2015 FINDINGS: Cardiovascular: Normal heart size. Aortic atherosclerosis. Calcification within the LAD and left circumflex coronary artery noted. Mediastinum/Nodes: The trachea appears patent and is midline. Normal appearance of the esophagus. No enlarged mediastinal or hilar lymph nodes. Lungs/Pleura: New small loculated left pleural effusion identified. Left lower lobe scar like density is similar to previous exam compatible with changes of external beam radiation. Nodular density in the anterior left base is identified and appears more conspicuous  than on previous exam. This currently measures 1.5 cm, image 108 of series 7. Previously 1.2 cm. New subpleural nodule in the posterior left base measures 9 mm, image 123 of series 7. Changes of external beam radiation are identified within the right upper lobe. Index lesion in the right upper lobe measures 2.1 x 1.7 cm, image 53 of series 7. Previously 2.3 x 2.4 cm. Upper Abdomen: No acute findings identified within the upper abdomen. Right adrenal nodule measures 1.5 cm, image 156 of series 2. Unchanged from previous exam. Musculoskeletal:  Spondylosis noted within the thoracic spine. No aggressive lytic or sclerotic bone lesion. IMPRESSION: 1. Again noted are changes of external beam radiation within the right upper lobe and left lower lobe. 2. New loculated left pleural effusion. 3. Index lesion within the right upper lobe is slightly decreased in size from previous exam. 4. Nodular density in the anterior left base is more conspicuous than on previous exam. The appearance is nonspecific but warrants attention on follow-up imaging. There is a new subpleural nodule within the posterior left lung base. Attention to this area on follow-up imaging is also recommended. If these nodules persist or have increased in size consider further investigation with PET-CT. 5. Aortic atherosclerosis and coronary artery calcification. Electronically Signed   By: Kerby Moors M.D.   On: 12/17/2015 10:07   Dg Chest Port 1 View  Result Date: 11/19/2015 CLINICAL DATA:  Left pneumothorax.  Chest tube removal. EXAM: PORTABLE CHEST 1 VIEW COMPARISON:  Chest x-rays earlier today 7:10 a.m. and previously. FINDINGS: Left chest tube removal with very slight increase in size of the left apicolateral pneumothorax, still likely on the order of 5-10% or so. Stable bibasilar atelectasis, left greater than right. No new pulmonary parenchymal abnormalities. IMPRESSION: 1. Very slight interval increase in size of the left apicolateral  pneumothorax after left chest tube removal. 2. Stable bibasilar atelectasis, left greater than right. No new abnormalities. Electronically Signed   By: Evangeline Dakin M.D.   On: 11/19/2015 13:00    Impression/Plan:  No evidence of progression or recurrence. Will keep an eye on nonspecific findings on CT.  I advised the patient to proceed with caution when he golf as it's possible that the motion of swinging has strained the pleural lining of his lung and may have contributed to his pleural effusion.  He knows he is at high risk for eventual disease progression, but luckily his disease hasn't behaved as aggressively as other metastatic cancers can.  His QOL is relatively good.  The patient will have a CT scan of the chest w/o contrast in 4 months and a follow up with me afterwards. He is pleased with this plan. I look forward to seeing him.  _____________________________________   [name], MD  This document serves as a record of services personally performed by [name], MD. It was created on her behalf by Darcus Austin, a trained medical scribe. The creation of this record is based on the scribe's personal observations and the provider's statements to them. This document has been checked and approved by the attending provider.

## 2015-12-18 NOTE — Progress Notes (Signed)
Steven Ibarra is here for follow up of radiation completed 02/20/15 to his Right Lung, superior lobe and radiation completed 06/01/2014 to his Right upper mass and Left Lower mass. He denies pain. He recent was in the hospital from a pneumothorax and a Chest tube placement the week of 11/18/15. He is recovering slowly from this, and tells me he still has some weakness. He tells me his shortness of breath has been improving since his pneumothorax. He has a congested cough in the morning and will improve as the day progresses. He has some white sputum production in the morning. He tells that before his hospitalization in August he was exercising on the treadmill Monday through Friday. He was taken off all restriction yesterday and plans to gradually increase his activity.   BP (!) 145/91   Pulse 96   Temp 97.8 F (36.6 C)   Ht '6\' 6"'$  (1.981 m)   Wt (!) 314 lb 3.2 oz (142.5 kg)   SpO2 96% Comment: room air  BMI 36.31 kg/m    Wt Readings from Last 3 Encounters:  12/18/15 (!) 314 lb 3.2 oz (142.5 kg)  12/17/15 (!) 301 lb (136.5 kg)  11/25/15 (!) 311 lb (141.1 kg)

## 2015-12-21 ENCOUNTER — Telehealth: Payer: Self-pay | Admitting: Medical

## 2015-12-21 NOTE — Telephone Encounter (Signed)
Reviewed pt not to me Dr Isidore Moos oncologist is planning to follow pt by CT in 4 months.

## 2016-01-06 ENCOUNTER — Other Ambulatory Visit (INDEPENDENT_AMBULATORY_CARE_PROVIDER_SITE_OTHER): Payer: BLUE CROSS/BLUE SHIELD

## 2016-01-06 DIAGNOSIS — E039 Hypothyroidism, unspecified: Secondary | ICD-10-CM

## 2016-01-06 DIAGNOSIS — E669 Obesity, unspecified: Secondary | ICD-10-CM

## 2016-01-06 DIAGNOSIS — R0981 Nasal congestion: Secondary | ICD-10-CM

## 2016-01-06 DIAGNOSIS — I10 Essential (primary) hypertension: Secondary | ICD-10-CM

## 2016-01-06 DIAGNOSIS — E782 Mixed hyperlipidemia: Secondary | ICD-10-CM

## 2016-01-06 DIAGNOSIS — E1169 Type 2 diabetes mellitus with other specified complication: Secondary | ICD-10-CM | POA: Diagnosis not present

## 2016-01-06 DIAGNOSIS — G4733 Obstructive sleep apnea (adult) (pediatric): Secondary | ICD-10-CM

## 2016-01-06 DIAGNOSIS — Z9989 Dependence on other enabling machines and devices: Secondary | ICD-10-CM

## 2016-01-06 DIAGNOSIS — E663 Overweight: Secondary | ICD-10-CM

## 2016-01-06 LAB — CBC
HCT: 41.3 % (ref 39.0–52.0)
Hemoglobin: 14 g/dL (ref 13.0–17.0)
MCHC: 33.9 g/dL (ref 30.0–36.0)
MCV: 87.3 fl (ref 78.0–100.0)
Platelets: 197 10*3/uL (ref 150.0–400.0)
RBC: 4.74 Mil/uL (ref 4.22–5.81)
RDW: 14.4 % (ref 11.5–15.5)
WBC: 4.9 10*3/uL (ref 4.0–10.5)

## 2016-01-06 LAB — COMPREHENSIVE METABOLIC PANEL
ALT: 22 U/L (ref 0–53)
AST: 14 U/L (ref 0–37)
Albumin: 3.7 g/dL (ref 3.5–5.2)
Alkaline Phosphatase: 40 U/L (ref 39–117)
BUN: 12 mg/dL (ref 6–23)
CHLORIDE: 105 meq/L (ref 96–112)
CO2: 27 meq/L (ref 19–32)
CREATININE: 1.02 mg/dL (ref 0.40–1.50)
Calcium: 9.3 mg/dL (ref 8.4–10.5)
GFR: 79.39 mL/min (ref >60.00)
Glucose, Bld: 191 mg/dL — ABNORMAL HIGH (ref 70–99)
Potassium: 4.2 mEq/L (ref 3.5–5.1)
SODIUM: 139 meq/L (ref 135–145)
Total Bilirubin: 0.5 mg/dL (ref 0.2–1.2)
Total Protein: 6.2 g/dL (ref 6.0–8.3)

## 2016-01-06 LAB — HEMOGLOBIN A1C: HEMOGLOBIN A1C: 8.1 % — AB (ref 4.6–6.5)

## 2016-01-06 LAB — LIPID PANEL
CHOL/HDL RATIO: 5
Cholesterol: 92 mg/dL (ref 0–200)
HDL: 20.3 mg/dL — ABNORMAL LOW (ref >39.00)
NONHDL: 71.54
Triglycerides: 263 mg/dL — ABNORMAL HIGH (ref 0.0–149.0)
VLDL: 52.6 mg/dL — ABNORMAL HIGH (ref 0.0–40.0)

## 2016-01-06 LAB — TSH: TSH: 3.71 u[IU]/mL (ref 0.35–4.50)

## 2016-01-06 LAB — LDL CHOLESTEROL, DIRECT: LDL DIRECT: 37 mg/dL

## 2016-01-07 ENCOUNTER — Other Ambulatory Visit: Payer: Self-pay | Admitting: Family Medicine

## 2016-01-16 ENCOUNTER — Ambulatory Visit: Payer: BLUE CROSS/BLUE SHIELD | Admitting: Family Medicine

## 2016-01-22 ENCOUNTER — Ambulatory Visit (INDEPENDENT_AMBULATORY_CARE_PROVIDER_SITE_OTHER): Payer: BLUE CROSS/BLUE SHIELD | Admitting: Pulmonary Disease

## 2016-01-22 ENCOUNTER — Encounter: Payer: Self-pay | Admitting: Pulmonary Disease

## 2016-01-22 ENCOUNTER — Ambulatory Visit (INDEPENDENT_AMBULATORY_CARE_PROVIDER_SITE_OTHER)
Admission: RE | Admit: 2016-01-22 | Discharge: 2016-01-22 | Disposition: A | Discharge status: Home / Self Care | Payer: BLUE CROSS/BLUE SHIELD | Source: Ambulatory Visit | Attending: Pulmonary Disease | Admitting: Pulmonary Disease

## 2016-01-22 VITALS — BP 136/84 | HR 78 | Ht 78.0 in | Wt 318.0 lb

## 2016-01-22 DIAGNOSIS — R0602 Shortness of breath: Secondary | ICD-10-CM

## 2016-01-22 DIAGNOSIS — Z9989 Dependence on other enabling machines and devices: Secondary | ICD-10-CM

## 2016-01-22 DIAGNOSIS — G4733 Obstructive sleep apnea (adult) (pediatric): Secondary | ICD-10-CM | POA: Diagnosis not present

## 2016-01-22 DIAGNOSIS — Z23 Encounter for immunization: Secondary | ICD-10-CM | POA: Diagnosis not present

## 2016-01-22 NOTE — Patient Instructions (Signed)
We will send you for a CXR today. Your CPAP will be changed to autoset 5-15 cm You will get a flu vaccine today Continue using the asmanex inhaler  Return in 6 months

## 2016-01-22 NOTE — Progress Notes (Signed)
Steven Ibarra    [id number]    1955/12/17  Primary Care Physician:Ibarra, Steven Levine, MD  Referring Physician: Mosie Lukes, MD [address] [place], [place] [address]  Chief complaint:  Follow up for  Dyspnea OSA  HPI: Steven Ibarra is a 60 Y/O with H/O of Basal Cell Carcinoma of the skin with mets to the Submandibular Lymph Node, Right Neck and bilateral lung metastases. He underwent XRT to the lung in  early 2016 and then again in Nov 2016. Subsequent imaging shows ongoing evolution of post radiation changes in RUL and LLL.  Complains of increasing dyspnea on exertion for the past 2 months. He has daily symptoms of cough in the morning which is nonproductive in nature. He does not have symptoms of dyspnea at rest. No fevers, chills, hemoptysis. He tries to maintain an active lifestyle and plays golf on a regular basis.   Outpatient Encounter Prescriptions as of 01/22/2016  Medication Sig  . albuterol (PROVENTIL HFA;VENTOLIN HFA) 108 (90 Base) MCG/ACT inhaler Inhale 2 puffs into the lungs every 6 (six) hours as needed for wheezing or shortness of breath.  . Ascorbic Acid (VITAMIN C) 1000 MG tablet Take 1,000 mg by mouth daily.  . B-D ULTRAFINE III SHORT PEN 31G X 8 MM MISC USE TWICE DAILY AS DIRECTED WITH LANTUS DX: 250.0  . BAYER MICROLET LANCETS lancets Use astwice daily to check blood sugar.  DX E11.9 BAYER CONTOUR NEXT EASY PER INSURANCE  . Blood Glucose Monitoring Suppl (CONTOUR BLOOD GLUCOSE SYSTEM) DEVI Use twice daily to check blood sugar.  DX E11.9  . cetirizine (ZYRTEC) 10 MG tablet Take 10 mg by mouth daily as needed for allergies.   . fenofibrate 160 MG tablet TAKE 1 TABLET BY MOUTH EVERY DAY.Marland Kitchen MAX OF 30 DAYS ON INSURANCE  . glucose blood (BAYER CONTOUR TEST) test strip Use twice daily to check blood sugar.  DX E11.9 BAYER CONTOUR NEXT EASY PER INSURANCE  . ibuprofen (ADVIL,MOTRIN) 200 MG tablet Take 600 mg by mouth every 6 (six) hours as needed for  pain.   . Insulin Glargine (LANTUS SOLOSTAR) 100 UNIT/ML Solostar Pen INJECT 100 UNITS IN MORNING AND 60 UNITS IN EVENING  . insulin lispro (HUMALOG) 100 UNIT/ML injection Inject 0.2 mLs (20 Units total) into the skin 3 (three) times daily before meals.  Steven Ibarra Oil 300 MG CAPS Take 1 capsule by mouth daily.  Marland Kitchen levothyroxine (SYNTHROID, LEVOTHROID) 75 MCG tablet TAKE 1 TABLET BY MOUTH DAILY BEFORE BREAKFAST  . losartan (COZAAR) 50 MG tablet TAKE 1 TABLET (50 MG TOTAL) BY MOUTH DAILY.  . metFORMIN (GLUCOPHAGE) 500 MG tablet TAKE 2 TABLETS BY MOUTH 2 TIMES A DAY.  Marland Kitchen Misc Natural Products (OSTEO BI-FLEX TRIPLE STRENGTH PO) Take 2 tablets by mouth daily.  . mometasone (ASMANEX 60 METERED DOSES) 220 MCG/INH inhaler Inhale 1 puff into the lungs 2 (two) times daily.  . NON FORMULARY 1 each by Other route 4 (four) times daily. Accu-check multiclix lancets- use as directed  . Probiotic Product (PROBIOTIC DAILY PO) Take by mouth.  . promethazine (PHENERGAN) 12.5 MG tablet Take 1 tablet (12.5 mg total) by mouth every 6 (six) hours as needed for nausea or vomiting.  . ranitidine (ZANTAC) 300 MG tablet TAKE 1 TABLET (300 MG TOTAL) BY MOUTH AT BEDTIME.  . rosuvastatin (CRESTOR) 40 MG tablet Take 1 tablet (40 mg total) by mouth daily.  . Syringe, Disposable, 3 ML MISC  DX: E11.9/E11.65  Use bid as directed  . traMADol (ULTRAM) 50 MG tablet Take 1 tablet (50 mg total) by mouth every 6 (six) hours as needed for severe pain.   No facility-administered encounter medications on file as of 01/22/2016.     Allergies as of 01/22/2016  . (No Known Allergies)    Past Medical History:  Diagnosis Date  . ALLERGIC RHINITIS, SEASONAL 10/23/2009  . Basal cell carcinoma of neck 05/28/2011   r suprahyoid, radical neck dissection S/p 6 weeks of targeted radiation therapy   . Cancer ([place])   . Costochondritis 09/16/2015  . Diabetes mellitus approx 2003   type 2  . DIABETES MELLITUS, TYPE II 10/23/2009  . DYSPNEA ON  EXERTION 10/23/2009  . ELEVATED BLOOD PRESSURE 04/23/2010  . GERD (gastroesophageal reflux disease)   . Hearing loss   . Hyperlipidemia   . Hypertension    Does not see a cardiologist, has not had a stress, echo   . Hypothyroid 01/28/2012  . Lung cancer ([place]) 03/08/14   invasive squamous cell carcinoma  . Mixed hyperlipidemia 10/23/2009  . Obesity   . OSA (obstructive sleep apnea)   . Otitis externa of right ear 07/06/2012  . Overweight(278.02) 10/23/2009  . Preventative health care 09/30/2011  . Pulled muscle 07/06/12  . S/P radiation therapy 07/06/11 - 08/19/11   Right Facial and Right Neck Nodes and right Trigeminal  Nerve to Base of Skull/ Total Dose 6600 cGy/ 33 Fractions  . Skin cancer    on nose, 9-10 yrs ago  . SLEEP APNEA, OBSTRUCTIVE 10/23/2009   Sleep study Done at [facility]  . SOB (shortness of breath) 09/16/2015  . Type II or unspecified type diabetes mellitus with unspecified complication, uncontrolled    type 2     Past Surgical History:  Procedure Laterality Date  . LUNG BIOPSY Right 03/08/2014  . RADICAL NECK DISSECTION  05/28/2011   Procedure: RADICAL NECK DISSECTION;  Surgeon: Melissa Montane, MD;  Location: [place];  Service: ENT;  Laterality: N/A;  Suprahyoid Neck Dissection  . skin cancer removal    . TONSILLECTOMY AND ADENOIDECTOMY      Family History  Problem Relation Age of Onset  . Other Mother     CHF  . Stroke Father   . Hyperlipidemia Father   . Heart disease Father     s/p valve replacement  . Hyperlipidemia Brother   . Obesity Brother   . Other Brother     Back pain  . Hyperlipidemia Brother   . Hypertension Brother   . Other Brother     Panic attacks  . Hyperlipidemia Brother   . Anesthesia problems Neg Hx     Social History   Social History  . Marital status: Married    Spouse name: N/A  . Number of children: N/A  . Years of education: N/A   Occupational History  . Not on file.   Social History Main Topics  . Smoking status: Former Smoker     Packs/day: 1.00    Years: 9.00    Types: Cigarettes    Quit date: 03/31/1975  . Smokeless tobacco: Never Used  . Alcohol use No     Comment: 4 beers a month, 06/12/11 rarely uses now  . Drug use: No  . Sexual activity: Yes   Other Topics Concern  . Not on file   Social History Narrative   Patient is married.   Patient with a history of smoking one pack per  day for approximately 29 years from ages of [age]. Patient denies ever having used smokeless tobacco. Patient with rare use of alcohol.      Mother died at age 19 from congestive heart failure complications. Father died at the age of 9 secondary to a stroke.     Review of systems: Review of Systems  Constitutional: Negative for fever and chills.  HENT: Negative.   Eyes: Negative for blurred vision.  Respiratory: as per HPI  Cardiovascular: Negative for chest pain and palpitations.  Gastrointestinal: Negative for vomiting, diarrhea, blood per rectum. Genitourinary: Negative for dysuria, urgency, frequency and hematuria.  Musculoskeletal: Negative for myalgias, back pain and joint pain.  Skin: Negative for itching and rash.  Neurological: Negative for dizziness, tremors, focal weakness, seizures and loss of consciousness.  Endo/Heme/Allergies: Negative for environmental allergies.  Psychiatric/Behavioral: Negative for depression, suicidal ideas and hallucinations.  All other systems reviewed and are negative.   Physical Exam: Blood pressure 136/84, pulse 78, height '6\' 6"'$  (1.981 m), weight (!) 318 lb (144.2 kg), SpO2 95 %. Gen:      No acute distress HEENT:  EOMI, sclera anicteric Neck:     No masses; no thyromegaly Lungs:    Clear to auscultation bilaterally; normal respiratory effort CV:         Regular rate and rhythm; no murmurs Abd:      + bowel sounds; soft, non-tender; no palpable masses, no distension Ext:    No edema; adequate peripheral perfusion Skin:      Warm and dry; no rash Neuro: alert and oriented x  3 Psych: normal mood and affect  Data Reviewed: PET scan 08/15/15. No clear evidence of metastatic disease.  Opacities, consolidation in RUL and LLL. Worse compared to prior CT scan CT scan 05/16/15. Nodular thickening, opacities in right upper lobe and left lower lobe.  PFTs 02/06/15 FVC 5.06 (80%) FEV1 2.76 (79%) F/F 74 TLC 92% DLCO 64%. Minimal obstructive disease, mild diffusion defect.  Sleep study 04/09/14- This study showed severe obstructive sleep apnea with an AHI of 36.2, and SaO2 low of 75%.   Assessment:  #1 Dyspnea on exertion. This may be related to evolving postradiation changes. I suspect there may be a component of obesity, deconditioning that is contributing to his symptoms. His PFTs were reviewed did not show any overt obstructive process suggestive of COPD, asthma. Continue on asmanex as it seems to have improved dyspnea symptoms. Continue albuterol PRN inhaler.  #2  Basal Cell Carcinoma of the skin with mets to the Submandibular Lymph Node, Right Neck and bilateral lung metastases. Follows up with Dr. Isidore Moos. Follow up CT scheduled.  #2 OSA Not tolerating current NIPPV set at 9. Switch to autoset CPAP 5-15 cm.   Plan/Recommendations: - CXR today - Switch to Auto CPAP 5-15 - Continue asmanex inhaler.    Marshell Garfinkel MD [facility] Pager [phone] 01/22/2016, 9:11 AM  CC: Steven Lukes, MD

## 2016-01-27 ENCOUNTER — Telehealth: Payer: Self-pay | Admitting: Pulmonary Disease

## 2016-01-27 NOTE — Telephone Encounter (Signed)
Pt request CXR results from 01-22-16.  PM please advise. Thanks.

## 2016-01-28 NOTE — Telephone Encounter (Signed)
Please let the patient know that the Chest x ray shows improvement in lung opacities. There are no new abnormalities

## 2016-01-29 NOTE — Telephone Encounter (Signed)
lmomtcb x1 

## 2016-01-29 NOTE — Telephone Encounter (Signed)
Spoke with pt. He is aware of his results. Nothing further was needed. 

## 2016-01-29 NOTE — Telephone Encounter (Signed)
Pt returning call.Steven Ibarra ° °

## 2016-02-07 ENCOUNTER — Telehealth: Payer: Self-pay | Admitting: Family Medicine

## 2016-02-07 ENCOUNTER — Other Ambulatory Visit: Payer: Self-pay | Admitting: Family Medicine

## 2016-02-07 MED ORDER — SYRINGE (DISPOSABLE) 3 ML MISC: 5 refills | Status: DC

## 2016-02-07 MED ORDER — INSULIN PEN NEEDLE 31G X 8 MM MISC: 5 refills | Status: DC

## 2016-02-10 ENCOUNTER — Other Ambulatory Visit: Payer: Self-pay | Admitting: Family Medicine

## 2016-02-10 MED ORDER — SYRINGE (DISPOSABLE) 3 ML MISC: 5 refills | Status: DC

## 2016-02-10 MED ORDER — INSULIN PEN NEEDLE 31G X 8 MM MISC: 5 refills | Status: DC

## 2016-02-10 NOTE — Telephone Encounter (Signed)
Faxed hardcopy for syringe to [facility] in [place]

## 2016-02-10 NOTE — Telephone Encounter (Signed)
Pt called in to follow up on request. Pt says that he would need to have a 90 day supply so that his insurance will cover Rx.    Informed pt of current Rx status. Advised that I will send a message back to [place] and PCP to be sure .

## 2016-02-10 NOTE — Addendum Note (Signed)
Addended by: Sharon Seller B on: 02/10/2016 03:49 PM   Modules accepted: Orders

## 2016-02-11 NOTE — Telephone Encounter (Signed)
Patient states that he needs syringes sent to the pharmacy. He states that the pharmacy said they didn't have a script for that. Please advise.

## 2016-02-11 NOTE — Telephone Encounter (Signed)
refaxed hardcopy for syringes and called the patient informed sent in.

## 2016-02-13 ENCOUNTER — Telehealth: Payer: Self-pay | Admitting: Family Medicine

## 2016-02-13 NOTE — Telephone Encounter (Signed)
Called the pharmacy and straightened out the problem.  They will get the syringes ready for the patient to pickup  Shortly. Called the patient to inform/had to leave a detailed message as he did not answer. Called again to inform syringes will be ready for pickup tonight.

## 2016-02-13 NOTE — Telephone Encounter (Signed)
Caller name: Relationship to patient: Self Can be reached:  [phone]  Pharmacy:  [facility], [address]. [place] [phone] (Phone) [phone] (Fax)     Reason for call: Patient states he needs a Rx sent in for Syringe, Disposable, 3 ML MISC [[id number] Pharmacy says they did not receive it. Patient will be out tonight and needs it before the end of the day.

## 2016-02-24 ENCOUNTER — Telehealth: Payer: Self-pay | Admitting: Family Medicine

## 2016-02-24 NOTE — Telephone Encounter (Signed)
Steven Ibarra- please call pt.  I would defer to Dr. Charlett Blake (who is here tomorrow) about treating this pt without evaluation.  He could also consider doing an e visit, or we might be able to see him tomorrow.  Please offer him these options.  However as I have not seen him in the past I do not feel comfortable treating him without seeing him

## 2016-02-24 NOTE — Telephone Encounter (Signed)
Caller name: Benn Tarver Relationship to patient: self Can be reached: [phone] Pharmacy: [facility], [address] [place]. [place]  Reason for call: previous lung cancer pt. Pt has had cold since just before Thanksgiving. He has had drainage and congestion. Coughing up some yellow mucus. Has been taking mucinex and advil cold & sinus. Pt is concerned bc of history. Pt is scheduled 11/29 8:15am with Dr. Nani Ravens but asking if Dr. Charlett Blake will call something in for him (abx) and not come in. He is scheduled with Dr. Charlett Blake 12/4. Please call.

## 2016-02-24 NOTE — Telephone Encounter (Signed)
Spoke to the patient and as message stated he is having cough/congestion/was given an antibiotic once before. He is scheduled to see Dr. Nani Ravens on the 29th.

## 2016-02-26 ENCOUNTER — Encounter: Payer: Self-pay | Admitting: Family Medicine

## 2016-02-26 ENCOUNTER — Ambulatory Visit (INDEPENDENT_AMBULATORY_CARE_PROVIDER_SITE_OTHER): Payer: BLUE CROSS/BLUE SHIELD | Admitting: Family Medicine

## 2016-02-26 VITALS — BP 154/100 | HR 85 | Temp 98.2°F | Ht 78.0 in | Wt 324.2 lb

## 2016-02-26 DIAGNOSIS — J189 Pneumonia, unspecified organism: Secondary | ICD-10-CM | POA: Diagnosis not present

## 2016-02-26 MED ORDER — BENZONATATE 100 MG PO CAPS: 100.0000 mg | ORAL_CAPSULE | Freq: Three times a day (TID) | ORAL | 0 refills | Status: DC | PRN

## 2016-02-26 MED ORDER — AZITHROMYCIN 250 MG PO TABS: ORAL_TABLET | ORAL | 0 refills | Status: DC

## 2016-02-26 NOTE — Progress Notes (Signed)
Chief Complaint  Patient presents with  . Nasal Congestion    Pt reports cold like symtpoms x2 weeks with sore throat congestion and runny nose     Steven Ibarra here for URI complaints.  Duration: 1 week  Associated symptoms: sinus congestion, sinus pain, rhinorrhea, ear pain, sore throat and productive cough (green) Denies: shortness of breath, chest pain, fevers and myalgia +History of stage IV lung cancer. Treatment to date: Fluids, Mucinex Sick contacts: Yes- wife has similar illness  Of note, he has not taken his blood pressure medication today.  ROS:  Const: Denies fevers HEENT: As noted in HPI Lungs: No SOB  Past Medical History:  Diagnosis Date  . ALLERGIC RHINITIS, SEASONAL 10/23/2009  . Basal cell carcinoma of neck 05/28/2011   r suprahyoid, radical neck dissection S/p 6 weeks of targeted radiation therapy   . Cancer ([place])   . Costochondritis 09/16/2015  . Diabetes mellitus approx 2003   type 2  . DIABETES MELLITUS, TYPE II 10/23/2009  . DYSPNEA ON EXERTION 10/23/2009  . ELEVATED BLOOD PRESSURE 04/23/2010  . GERD (gastroesophageal reflux disease)   . Hearing loss   . Hyperlipidemia   . Hypertension    Does not see a cardiologist, has not had a stress, echo   . Hypothyroid 01/28/2012  . Lung cancer ([place]) 03/08/14   invasive squamous cell carcinoma  . Mixed hyperlipidemia 10/23/2009  . Obesity   . OSA (obstructive sleep apnea)   . Otitis externa of right ear 07/06/2012  . Overweight(278.02) 10/23/2009  . Preventative health care 09/30/2011  . Pulled muscle 07/06/12  . S/P radiation therapy 07/06/11 - 08/19/11   Right Facial and Right Neck Nodes and right Trigeminal  Nerve to Base of Skull/ Total Dose 6600 cGy/ 33 Fractions  . Skin cancer    on nose, 9-10 yrs ago  . SLEEP APNEA, OBSTRUCTIVE 10/23/2009   Sleep [facility] Done at [facility]  . SOB (shortness of breath) 09/16/2015  . Type II or unspecified type diabetes mellitus with unspecified complication, uncontrolled    type 2    Family History  Problem Relation Age of Onset  . Other Mother     CHF  . Stroke Father   . Hyperlipidemia Father   . Heart disease Father     s/p valve replacement  . Hyperlipidemia Brother   . Obesity Brother   . Other Brother     Back pain  . Hyperlipidemia Brother   . Hypertension Brother   . Other Brother     Panic attacks  . Hyperlipidemia Brother   . Anesthesia problems Neg Hx     BP (!) 154/100 (BP Location: Right Arm, Patient Position: Sitting, Cuff Size: Large)   Pulse 85   Temp 98.2 F (36.8 C) (Oral)   Ht '6\' 6"'$  (1.981 m)   Wt (!) 324 lb 3.2 oz (147.1 kg)   SpO2 97%   BMI 37.47 kg/m  General: Awake, alert, appears stated age HEENT: AT, [place], ears patent b/l and TM's neg, nares patent w/o discharge, pharynx pink and without exudates, MMM Neck: No masses or asymmetry Heart: RRR, no murmurs, no bruits Lungs: CTAB, no accessory muscle use Psych: Age appropriate judgment and insight, normal mood and affect  Walking pneumonia - Plan: azithromycin (ZITHROMAX) 250 MG tablet, benzonatate (TESSALON) 100 MG capsule  Orders as above. Flonase for nasal congestion. Try supportive care for the next 2-3 days. F/u in 1 week if symptoms worsen or fail to improve. Pt  voiced understanding and agreement to the plan.  [place], DO 02/26/16 9:50 AM

## 2016-02-26 NOTE — Patient Instructions (Signed)
Follow up with Dr. Charlett Blake as originally scheduled to recheck your BP.  Wait a couple days (Friday) before taking antibiotic (Zpak).   Flonase, 2 sprays each nostril daily may also help.

## 2016-02-26 NOTE — Progress Notes (Signed)
Pre visit review using our clinic review tool, if applicable. No additional management support is needed unless otherwise documented below in the visit note. 

## 2016-02-27 ENCOUNTER — Telehealth: Payer: Self-pay | Admitting: Family Medicine

## 2016-02-27 NOTE — Telephone Encounter (Signed)
No labs needed. His last set was mid October so it is too soon to repeat them

## 2016-02-27 NOTE — Telephone Encounter (Signed)
Caller name: Relationship to patient: Self Can be reached: [phone]  Pharmacy:  Reason for call: Patient wants to know if he needs labs prior to his appointment on 12/4. Plse adv

## 2016-02-28 NOTE — Telephone Encounter (Signed)
Patient informed of PCP instructions. 

## 2016-02-28 NOTE — Telephone Encounter (Signed)
Called left msg to call back. 

## 2016-03-02 ENCOUNTER — Ambulatory Visit (INDEPENDENT_AMBULATORY_CARE_PROVIDER_SITE_OTHER): Payer: BLUE CROSS/BLUE SHIELD | Admitting: Family Medicine

## 2016-03-02 ENCOUNTER — Encounter: Payer: Self-pay | Admitting: Family Medicine

## 2016-03-02 DIAGNOSIS — E669 Obesity, unspecified: Secondary | ICD-10-CM

## 2016-03-02 DIAGNOSIS — E1169 Type 2 diabetes mellitus with other specified complication: Secondary | ICD-10-CM | POA: Diagnosis not present

## 2016-03-02 DIAGNOSIS — I10 Essential (primary) hypertension: Secondary | ICD-10-CM

## 2016-03-02 DIAGNOSIS — E782 Mixed hyperlipidemia: Secondary | ICD-10-CM

## 2016-03-02 DIAGNOSIS — J189 Pneumonia, unspecified organism: Secondary | ICD-10-CM | POA: Diagnosis not present

## 2016-03-02 DIAGNOSIS — E663 Overweight: Secondary | ICD-10-CM

## 2016-03-02 DIAGNOSIS — B353 Tinea pedis: Secondary | ICD-10-CM

## 2016-03-02 HISTORY — DX: Tinea pedis: B35.3

## 2016-03-02 HISTORY — DX: Pneumonia, unspecified organism: J18.9

## 2016-03-02 MED ORDER — BENZONATATE 100 MG PO CAPS: 100.0000 mg | ORAL_CAPSULE | Freq: Three times a day (TID) | ORAL | 0 refills | Status: DC | PRN

## 2016-03-02 MED ORDER — INSULIN GLARGINE 100 UNIT/ML SOLOSTAR PEN: PEN_INJECTOR | SUBCUTANEOUS | 0 refills | Status: DC

## 2016-03-02 NOTE — Assessment & Plan Note (Signed)
On Levothyroxine, continue to monitor 

## 2016-03-02 NOTE — Assessment & Plan Note (Signed)
Lotrimin and lotion after shower

## 2016-03-02 NOTE — Assessment & Plan Note (Signed)
hgba1c up, minimize simple carbs. Increase exercise as tolerated. Continue current meds. Highest sugar in evening in mid 200s, 150s in am noted routinely

## 2016-03-02 NOTE — Assessment & Plan Note (Signed)
Encouraged heart healthy diet, increase exercise, avoid trans fats, consider a krill oil cap daily. Tolerating Crestor

## 2016-03-02 NOTE — Progress Notes (Signed)
Pre visit review using our clinic review tool, if applicable. No additional management support is needed unless otherwise documented below in the visit note. 

## 2016-03-02 NOTE — Patient Instructions (Addendum)
Use some Lotrimin after shower with Eucerin on feet for dry skin  Take Lantus inject 20m into the skin  Athlete's Foot Introduction Athlete's foot (tinea pedis) is a fungal infection of the skin on the feet. It often occurs on the skin that is between or underneath the toes. It can also occur on the soles of the feet. The infection can spread from person to person (is contagious). What are the causes? Athlete's foot is caused by a fungus. This fungus grows in warm, moist places. Most people get athlete's foot by sharing shower stalls, towels, and wet floors with someone who is infected. Not washing your feet or changing your socks often enough can contribute to athlete's foot. What increases the risk? This condition is more likely to develop in:  Men.  People who have a weak body defense system (immune system).  People who have diabetes.  People who use public showers, such as at a gym.  People who wear heavy-duty shoes, such as [employer]  Seasons with warm, humid weather. What are the signs or symptoms? Symptoms of this condition include:  Itchy areas between the toes or on the soles of the feet.  White, flaky, or scaly areas between the toes or on the soles of the feet.  Very itchy small blisters between the toes or on the soles of the feet.  Small cuts on the skin. These cuts can become infected.  Thick or discolored toenails. How is this diagnosed? This condition is diagnosed with a medical history and physical exam. Your health care provider may also take a skin or toenail sample to be examined. How is this treated? Treatment for this condition includes antifungal medicines. These may be applied as powders, ointments, or creams. In severe cases, an oral antifungal medicine may be given. Follow these instructions at home:  Apply or take over-the-counter and prescription medicines only as told by your health care provider.  Keep all follow-up visits as  told by your health care provider. This is important.  Do not scratch your feet.  Keep your feet dry:  Wear cotton or wool socks. Change your socks every day or if they become wet.  Wear shoes that allow air to circulate, such as sandals or canvas tennis shoes.  Wash and dry your feet:  Every day or as told by your health care provider.  After exercising.  Including the area between your toes.  Do not share towels, nail clippers, or other personal items that touch your feet with others.  If you have diabetes, keep your blood sugar under control. How is this prevented?  Do not share towels.  Wear sandals in wet areas, such as locker rooms and shared showers.  Keep your feet dry:  Wear cotton or wool socks. Change your socks every day or if they become wet.  Wear shoes that allow air to circulate, such as sandals or canvas tennis shoes.  Wash and dry your feet after exercising. Pay attention to the area between your toes. Contact a health care provider if:  You have a fever.  You have swelling, soreness, warmth, or redness in your foot.  You are not getting better with treatment.  Your symptoms get worse.  You have new symptoms. This information is not intended to replace advice given to you by your health care provider. Make sure you discuss any questions you have with your health care provider. Document Released: 03/13/2000 Document Revised: 08/22/2015 Document Reviewed: 09/17/2014  2017  Elsevier

## 2016-03-02 NOTE — Assessment & Plan Note (Signed)
Has done well with Zpak but still notes a cough. Will refill tessalon and he will report if cough persists.

## 2016-03-02 NOTE — Progress Notes (Signed)
Patient ID: Steven Ibarra, male   DOB: 04-Sep-1955, 60 y.o.   MRN: [id number]    Subjective:    Patient ID: Steven Ibarra, male    DOB: 1955-04-08, 60 y.o.   MRN: [id number]  Chief Complaint  Patient presents with  . Follow-up    HPI Patient is in today for 4 month follow up patient c/o of cough and wheezing. He is feeling much better and notes his cough is improving. Fevers have resolved. He requests a refill on Tessalon perles to help him sleep while this runs it's course. He also acknowledges that he is not eating well and eating the wrong foods. Denies CP/palp/SOB/HA/fevers/GI or GU c/o. Taking meds as prescribed  Past Medical History:  Diagnosis Date  . ALLERGIC RHINITIS, SEASONAL 10/23/2009  . Basal cell carcinoma of neck 05/28/2011   r suprahyoid, radical neck dissection S/p 6 weeks of targeted radiation therapy   . Cancer ([place])   . CAP (community acquired pneumonia) 03/02/2016  . Costochondritis 09/16/2015  . Diabetes mellitus approx 2003   type 2  . DIABETES MELLITUS, TYPE II 10/23/2009  . DYSPNEA ON EXERTION 10/23/2009  . ELEVATED BLOOD PRESSURE 04/23/2010  . GERD (gastroesophageal reflux disease)   . Hearing loss   . Hyperlipidemia   . Hypertension    Does not see a cardiologist, has not had a stress, echo   . Hypothyroid 01/28/2012  . Lung cancer ([place]) 03/08/14   invasive squamous cell carcinoma  . Mixed hyperlipidemia 10/23/2009  . Obesity   . OSA (obstructive sleep apnea)   . Otitis externa of right ear 07/06/2012  . Overweight(278.02) 10/23/2009  . Preventative health care 09/30/2011  . Pulled muscle 07/06/12  . S/P radiation therapy 07/06/11 - 08/19/11   Right Facial and Right Neck Nodes and right Trigeminal  Nerve to Base of Skull/ Total Dose 6600 cGy/ 33 Fractions  . Skin cancer    on nose, 9-10 yrs ago  . SLEEP APNEA, OBSTRUCTIVE 10/23/2009   Sleep study Done at [facility]  . SOB (shortness of breath) 09/16/2015  . Tinea pedis of both feet 03/02/2016  . Type II or  unspecified type diabetes mellitus with unspecified complication, uncontrolled    type 2     Past Surgical History:  Procedure Laterality Date  . LUNG BIOPSY Right 03/08/2014  . RADICAL NECK DISSECTION  05/28/2011   Procedure: RADICAL NECK DISSECTION;  Surgeon: Melissa Montane, MD;  Location: [facility];  Service: ENT;  Laterality: N/A;  Suprahyoid Neck Dissection  . skin cancer removal    . TONSILLECTOMY AND ADENOIDECTOMY      Family History  Problem Relation Age of Onset  . Other Mother     CHF  . Stroke Father   . Hyperlipidemia Father   . Heart disease Father     s/p valve replacement  . Hyperlipidemia Brother   . Obesity Brother   . Other Brother     Back pain  . Hyperlipidemia Brother   . Hypertension Brother   . Other Brother     Panic attacks  . Hyperlipidemia Brother   . Anesthesia problems Neg Hx     Social History   Social History  . Marital status: Married    Spouse name: N/A  . Number of children: N/A  . Years of education: N/A   Occupational History  . Not on file.   Social History Main Topics  . Smoking status: Former Smoker    Packs/day: 1.00  Years: 9.00    Types: Cigarettes    Quit date: 03/31/1975  . Smokeless tobacco: Never Used  . Alcohol use No     Comment: 4 beers a month, 06/12/11 rarely uses now  . Drug use: No  . Sexual activity: Yes   Other Topics Concern  . Not on file   Social History Narrative   Patient is married.   Patient with a history of smoking one pack per day for approximately 29 years from ages of [age]. Patient denies ever having used smokeless tobacco. Patient with rare use of alcohol.      Mother died at age 67 from congestive heart failure complications. Father died at the age of 28 secondary to a stroke.    Outpatient Medications Prior to Visit  Medication Sig Dispense Refill  . albuterol (PROVENTIL HFA;VENTOLIN HFA) 108 (90 Base) MCG/ACT inhaler Inhale 2 puffs into the lungs every 6 (six) hours as needed for wheezing  or shortness of breath. 18 g 2  . Ascorbic Acid (VITAMIN C) 1000 MG tablet Take 1,000 mg by mouth daily.    Marland Kitchen BAYER MICROLET LANCETS lancets Use astwice daily to check blood sugar.  DX E11.9 BAYER CONTOUR NEXT EASY PER INSURANCE 100 each 6  . Blood Glucose Monitoring Suppl (CONTOUR BLOOD GLUCOSE SYSTEM) DEVI Use twice daily to check blood sugar.  DX E11.9 1 Device 0  . cetirizine (ZYRTEC) 10 MG tablet Take 10 mg by mouth daily as needed for allergies.     . fenofibrate 160 MG tablet TAKE 1 TABLET BY MOUTH EVERY DAY.Marland Kitchen MAX OF 30 DAYS ON INSURANCE 90 tablet 1  . glucose blood (BAYER CONTOUR TEST) test strip Use twice daily to check blood sugar.  DX E11.9 BAYER CONTOUR NEXT EASY PER INSURANCE 100 each 6  . ibuprofen (ADVIL,MOTRIN) 200 MG tablet Take 600 mg by mouth every 6 (six) hours as needed for pain.     Marland Kitchen insulin lispro (HUMALOG) 100 UNIT/ML injection Inject 0.2 mLs (20 Units total) into the skin 3 (three) times daily before meals. 30 mL 4  . Insulin Pen Needle (B-D ULTRAFINE III SHORT PEN) 31G X 8 MM MISC Use twice daily as directed with lantus 300 each 5  . Krill Oil 300 MG CAPS Take 1 capsule by mouth daily.    Marland Kitchen levothyroxine (SYNTHROID, LEVOTHROID) 75 MCG tablet TAKE 1 TABLET BY MOUTH DAILY BEFORE BREAKFAST 30 tablet 0  . losartan (COZAAR) 50 MG tablet TAKE 1 TABLET (50 MG TOTAL) BY MOUTH DAILY. 90 tablet 1  . metFORMIN (GLUCOPHAGE) 500 MG tablet TAKE 2 TABLETS BY MOUTH 2 TIMES A DAY. 120 tablet 1  . Misc Natural Products (OSTEO BI-FLEX TRIPLE STRENGTH PO) Take 2 tablets by mouth daily.    . mometasone (ASMANEX 60 METERED DOSES) 220 MCG/INH inhaler Inhale 1 puff into the lungs 2 (two) times daily. 1 Inhaler 5  . NON FORMULARY 1 each by Other route 4 (four) times daily. Accu-check multiclix lancets- use as directed    . Probiotic Product (PROBIOTIC DAILY PO) Take by mouth.    . promethazine (PHENERGAN) 12.5 MG tablet Take 1 tablet (12.5 mg total) by mouth every 6 (six) hours as needed for  nausea or vomiting. 15 tablet 0  . ranitidine (ZANTAC) 300 MG tablet TAKE 1 TABLET (300 MG TOTAL) BY MOUTH AT BEDTIME. 90 tablet 1  . rosuvastatin (CRESTOR) 40 MG tablet Take 1 tablet (40 mg total) by mouth daily. 90 tablet 1  . Syringe, Disposable,  3 ML MISC DX: E11.9/E11.65  Use bid as directed 300 each 5  . traMADol (ULTRAM) 50 MG tablet Take 1 tablet (50 mg total) by mouth every 6 (six) hours as needed for severe pain. 20 tablet 0  . azithromycin (ZITHROMAX) 250 MG tablet Take 2 tabs the first day and 1 tab daily until you run out. 6 tablet 0  . benzonatate (TESSALON) 100 MG capsule Take 1 capsule (100 mg total) by mouth 3 (three) times daily as needed. 20 capsule 0  . LANTUS SOLOSTAR 100 UNIT/ML Solostar Pen INJECT 100 UNITS INTO THE SKIN IN MORNING AND INJECT 60 UNITS INTO THE SKIN EVERY EVENING. 135 mL 0   No facility-administered medications prior to visit.     No Known Allergies  Review of Systems  Constitutional: Positive for malaise/fatigue. Negative for fever.  HENT: Positive for congestion.   Eyes: Negative for blurred vision.  Respiratory: Positive for cough and wheezing. Negative for shortness of breath.   Cardiovascular: Negative for chest pain and palpitations.  Gastrointestinal: Negative for vomiting.  Musculoskeletal: Negative for back pain.  Skin: Positive for itching. Negative for rash.  Neurological: Negative for loss of consciousness and headaches.       Objective:    Physical Exam  Constitutional: He is oriented to person, place, and time. He appears well-developed and well-nourished. No distress.  HENT:  Head: Normocephalic and atraumatic.  Eyes: Conjunctivae are normal.  Neck: Normal range of motion. No thyromegaly present.  Cardiovascular: Normal rate and regular rhythm.   Pulmonary/Chest: Effort normal and breath sounds normal. He has no wheezes.  Abdominal: Soft. Bowel sounds are normal. There is no tenderness.  Musculoskeletal: Normal range of  motion. He exhibits no edema or deformity.  Neurological: He is alert and oriented to person, place, and time.  Skin: Skin is warm and dry. He is not diaphoretic.  Dry scaly skin on b/l heals, flaky skin between toes  Psychiatric: He has a normal mood and affect.    BP 128/80   Pulse 89   Temp 97.8 F (36.6 C) (Oral)   Wt (!) 319 lb 9.6 oz (145 kg)   SpO2 96%   BMI 36.93 kg/m  Wt Readings from Last 3 Encounters:  03/02/16 (!) 319 lb 9.6 oz (145 kg)  02/26/16 (!) 324 lb 3.2 oz (147.1 kg)  01/22/16 (!) 318 lb (144.2 kg)     Lab Results  Component Value Date   WBC 4.9 01/06/2016   HGB 14.0 01/06/2016   HCT 41.3 01/06/2016   PLT 197.0 01/06/2016   GLUCOSE 191 (H) 01/06/2016   CHOL 92 01/06/2016   TRIG 263.0 (H) 01/06/2016   HDL 20.30 (L) 01/06/2016   LDLDIRECT 37.0 01/06/2016   LDLCALC 18 09/08/2013   ALT 22 01/06/2016   AST 14 01/06/2016   NA 139 01/06/2016   K 4.2 01/06/2016   CL 105 01/06/2016   CREATININE 1.02 01/06/2016   BUN 12 01/06/2016   CO2 27 01/06/2016   TSH 3.71 01/06/2016   PSA 1.46 09/25/2011   INR 0.95 03/08/2014   HGBA1C 8.1 (H) 01/06/2016   MICROALBUR 0.7 09/10/2015    Lab Results  Component Value Date   TSH 3.71 01/06/2016   Lab Results  Component Value Date   WBC 4.9 01/06/2016   HGB 14.0 01/06/2016   HCT 41.3 01/06/2016   MCV 87.3 01/06/2016   PLT 197.0 01/06/2016   Lab Results  Component Value Date   NA 139 01/06/2016  K 4.2 01/06/2016   CO2 27 01/06/2016   GLUCOSE 191 (H) 01/06/2016   BUN 12 01/06/2016   CREATININE 1.02 01/06/2016   BILITOT 0.5 01/06/2016   ALKPHOS 40 01/06/2016   AST 14 01/06/2016   ALT 22 01/06/2016   PROT 6.2 01/06/2016   ALBUMIN 3.7 01/06/2016   CALCIUM 9.3 01/06/2016   ANIONGAP 5 11/19/2015   EGFR 70 (L) 01/10/2015   GFR 79.39 01/06/2016   Lab Results  Component Value Date   CHOL 92 01/06/2016   Lab Results  Component Value Date   HDL 20.30 (L) 01/06/2016   Lab Results  Component Value  Date   LDLCALC 18 09/08/2013   Lab Results  Component Value Date   TRIG 263.0 (H) 01/06/2016   Lab Results  Component Value Date   CHOLHDL 5 01/06/2016   Lab Results  Component Value Date   HGBA1C 8.1 (H) 01/06/2016  I acted as a Education administrator for Dr. Charlett Blake. Princess, RMA      Assessment & Plan:   Problem List Items Addressed This Visit    MIXED HYPERLIPIDEMIA    Encouraged heart healthy diet, increase exercise, avoid trans fats, consider a krill oil cap daily. Tolerating Crestor      Overweight    Encouraged DASH diet, decrease po intake and increase exercise as tolerated. Needs 7-8 hours of sleep nightly. Avoid trans fats, eat small, frequent meals every 4-5 hours with lean proteins, complex carbs and healthy fats. Minimize simple carbs      Hypertension    Well controlled, no changes to meds. Encouraged heart healthy diet such as the DASH diet and exercise as tolerated.       Diabetes mellitus type 2 in obese (HCC)    hgba1c up, minimize simple carbs. Increase exercise as tolerated. Continue current meds. Highest sugar in evening in mid 200s, 150s in am noted routinely      Relevant Medications   Insulin Glargine (LANTUS SOLOSTAR) 100 UNIT/ML Solostar Pen   CAP (community acquired pneumonia)    Has done well with Zpak but still notes a cough. Will refill tessalon and he will report if cough persists.      Relevant Medications   benzonatate (TESSALON) 100 MG capsule   Tinea pedis of both feet    Lotrimin and lotion after shower       Other Visit Diagnoses    Walking pneumonia       Relevant Medications   benzonatate (TESSALON) 100 MG capsule      I have discontinued Mr. Schnepf azithromycin. I have also changed his LANTUS SOLOSTAR to Insulin Glargine. Additionally, I am having him maintain his NON FORMULARY, cetirizine, ibuprofen, Misc Natural Products (OSTEO BI-FLEX TRIPLE STRENGTH PO), vitamin C, Krill Oil, Probiotic Product (PROBIOTIC DAILY PO), glucose blood,  BAYER MICROLET LANCETS, CONTOUR BLOOD GLUCOSE SYSTEM, insulin lispro, fenofibrate, losartan, albuterol, mometasone, rosuvastatin, ranitidine, traMADol, promethazine, metFORMIN, levothyroxine, Insulin Pen Needle, Syringe (Disposable), and benzonatate.  Meds ordered this encounter  Medications  . benzonatate (TESSALON) 100 MG capsule    Sig: Take 1 capsule (100 mg total) by mouth 3 (three) times daily as needed.    Dispense:  40 capsule    Refill:  0  . Insulin Glargine (LANTUS SOLOSTAR) 100 UNIT/ML Solostar Pen    Sig: INJECT 100 UNITS INTO THE SKIN IN MORNING AND INJECT 70 UNITS INTO THE SKIN EVERY EVENING.    Dispense:  135 mL    Refill:  0    **Patient requests  90 days supply**   RMA functioned as a Education administrator during this visit and I have reviewed her documentation and agree.   Penni Homans, MD

## 2016-03-02 NOTE — Assessment & Plan Note (Signed)
Well controlled, no changes to meds. Encouraged heart healthy diet such as the DASH diet and exercise as tolerated.  °

## 2016-03-02 NOTE — Assessment & Plan Note (Signed)
Encouraged DASH diet, decrease po intake and increase exercise as tolerated. Needs 7-8 hours of sleep nightly. Avoid trans fats, eat small, frequent meals every 4-5 hours with lean proteins, complex carbs and healthy fats. Minimize simple carbs 

## 2016-03-11 ENCOUNTER — Telehealth: Payer: Self-pay | Admitting: Family Medicine

## 2016-03-11 ENCOUNTER — Other Ambulatory Visit: Payer: Self-pay | Admitting: Family Medicine

## 2016-03-11 NOTE — Telephone Encounter (Signed)
Patient is requesting a refill of insulin lispro (HUMALOG) 100 UNIT/ML injection and levothyroxine (SYNTHROID, LEVOTHROID) 75 MCG tablet and albuterol (PROVENTIL HFA;VENTOLIN HFA) 108 (90 Base) MCG/ACT inhaler Patient would like a 90 day supply. Please advise  Pharmacy: [facility] [place], [place]. [name]

## 2016-03-12 MED ORDER — LEVOTHYROXINE SODIUM 75 MCG PO TABS: 75.0000 ug | ORAL_TABLET | Freq: Every day | ORAL | 1 refills | Status: DC

## 2016-03-12 MED ORDER — ALBUTEROL SULFATE HFA 108 (90 BASE) MCG/ACT IN AERS: 2.0000 | INHALATION_SPRAY | Freq: Four times a day (QID) | RESPIRATORY_TRACT | 3 refills | Status: DC | PRN

## 2016-03-12 MED ORDER — INSULIN LISPRO 100 UNIT/ML ~~LOC~~ SOLN: 20.0000 [IU] | Freq: Three times a day (TID) | SUBCUTANEOUS | 3 refills | Status: DC

## 2016-03-12 NOTE — Telephone Encounter (Signed)
Refill done as requested 

## 2016-04-02 ENCOUNTER — Other Ambulatory Visit: Payer: Self-pay | Admitting: Family Medicine

## 2016-04-03 ENCOUNTER — Other Ambulatory Visit: Payer: Self-pay | Admitting: Family Medicine

## 2016-04-03 MED ORDER — TRAMADOL HCL 50 MG PO TABS: 50.0000 mg | ORAL_TABLET | Freq: Four times a day (QID) | ORAL | 0 refills | Status: DC | PRN

## 2016-04-03 NOTE — Telephone Encounter (Signed)
Faxed hardcopy for Tramadol to [facility] [place]

## 2016-04-03 NOTE — Telephone Encounter (Signed)
Last refill for tramadol 11/20/2015  #20 with no refills Last office visit 03/02/2016 Pharmacy [facility] [place]--fax number is [phone]

## 2016-04-09 ENCOUNTER — Telehealth: Payer: Self-pay | Admitting: *Deleted

## 2016-04-09 NOTE — Telephone Encounter (Signed)
CALLED PATIENT TO INFORM OF CT FOR 04-23-16 - ARRIVAL TIME - 8:45 AM, NO RESTRICTIONS TO TEST, TEST ORDERED WITHOUT CONTRAST, TEST TO BE @ [facility] AND HIS FU VISIT WITH DR. Isidore Moos ON 04-24-16 @ 11:20 AM FOR RESULTS, SPOKE WITH PATIENT AND HE IS AWARE OF THESE APPTS.

## 2016-04-20 ENCOUNTER — Encounter: Payer: Self-pay | Admitting: Radiation Oncology

## 2016-04-22 ENCOUNTER — Other Ambulatory Visit: Payer: Self-pay | Admitting: Family Medicine

## 2016-04-23 ENCOUNTER — Ambulatory Visit (HOSPITAL_COMMUNITY)
Admission: RE | Admit: 2016-04-23 | Discharge: 2016-04-23 | Disposition: A | Discharge status: Home / Self Care | Payer: BLUE CROSS/BLUE SHIELD | Source: Ambulatory Visit | Attending: Radiation Oncology | Admitting: Radiation Oncology

## 2016-04-23 DIAGNOSIS — S2232XA Fracture of one rib, left side, initial encounter for closed fracture: Secondary | ICD-10-CM | POA: Diagnosis not present

## 2016-04-23 DIAGNOSIS — C7800 Secondary malignant neoplasm of unspecified lung: Secondary | ICD-10-CM

## 2016-04-23 DIAGNOSIS — R911 Solitary pulmonary nodule: Secondary | ICD-10-CM | POA: Insufficient documentation

## 2016-04-23 DIAGNOSIS — C3492 Malignant neoplasm of unspecified part of left bronchus or lung: Secondary | ICD-10-CM | POA: Diagnosis not present

## 2016-04-23 DIAGNOSIS — I700 Atherosclerosis of aorta: Secondary | ICD-10-CM | POA: Insufficient documentation

## 2016-04-23 DIAGNOSIS — C3491 Malignant neoplasm of unspecified part of right bronchus or lung: Secondary | ICD-10-CM | POA: Insufficient documentation

## 2016-04-23 DIAGNOSIS — I2510 Atherosclerotic heart disease of native coronary artery without angina pectoris: Secondary | ICD-10-CM | POA: Insufficient documentation

## 2016-04-23 DIAGNOSIS — K760 Fatty (change of) liver, not elsewhere classified: Secondary | ICD-10-CM | POA: Diagnosis not present

## 2016-04-23 DIAGNOSIS — E279 Disorder of adrenal gland, unspecified: Secondary | ICD-10-CM | POA: Diagnosis not present

## 2016-04-23 DIAGNOSIS — Y842 Radiological procedure and radiotherapy as the cause of abnormal reaction of the patient, or of later complication, without mention of misadventure at the time of the procedure: Secondary | ICD-10-CM | POA: Insufficient documentation

## 2016-04-24 ENCOUNTER — Ambulatory Visit
Admission: RE | Admit: 2016-04-24 | Discharge: 2016-04-24 | Disposition: A | Discharge status: Home / Self Care | Payer: BLUE CROSS/BLUE SHIELD | Source: Ambulatory Visit | Attending: Radiation Oncology | Admitting: Radiation Oncology

## 2016-04-24 ENCOUNTER — Encounter: Payer: Self-pay | Admitting: Radiation Oncology

## 2016-04-24 DIAGNOSIS — C801 Malignant (primary) neoplasm, unspecified: Secondary | ICD-10-CM | POA: Insufficient documentation

## 2016-04-24 DIAGNOSIS — C7800 Secondary malignant neoplasm of unspecified lung: Secondary | ICD-10-CM | POA: Diagnosis not present

## 2016-04-24 HISTORY — DX: Personal history of irradiation: Z92.3

## 2016-04-24 NOTE — Progress Notes (Addendum)
Radiation Oncology         [phone] ________________________________  Name: Steven Ibarra MRN: [id number]  Date: 04/24/2016  DOB: 1955/11/12  Follow-Up Visit Note  Outpatient  CC: Penni Homans, MD  Melissa Montane, MD  Diagnosis and Prior Radiotherapy:     ICD-9-CM ICD-10-CM   1. 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)  Prior Radiotherapy to H+N region: 07/06/11 to 08/19/11: 66 Gy in 33 fractions to the right facial nodes through the right neck and right trigeminal nerve to base of skull  LUNG metastases history:  Radiation treatment dates:   05/23/2014, 05/25/2014, 05/28/2014, 05/30/2014, 06/01/2014 Site/dose:    1) Right upper lung mass / 54 Gy in 3 fractions 2) Left lower lung mass and adjacent nodules / 50 Gy in 5 fractions   Radiation treatment dates:   02/11/2015-02/20/2015 Site/dose: 1) Right lung,  superior upper lobe / 50 Gy in 5 fractions 2) Left lung, posterior lower lobe / 50 Gy in 5 fractions   Narrative:  The patient returns today for routine follow-up of radiation completed 02/20/15.  On review of systems, the patient denies pain at this time, but reports pain over his left nipple area when he reaches for the floor and stands back up. He has some leg weakness, which occurs some days and is relieved others. He reports he is using a new CPAP at night which has improved his breathing. He is eating well.  The patient had a chest CT yesterday, showing radiation changes in the right upper and left lower lobes. Index right upper lobe lesion is decreased in size. Posterior left lower lobe pulmonary nodule is significantly enlarged, consistent with progressive metastasis. An anterolateral left lower lobe nodule is felt to be new or significantly enlarged,  but was possibly partially obscured by volume loss on the prior exam. A more medial anterior left lower lobe nodule is unchanged. No thoracic adenopathy noted. Coronary artery atherosclerosis and Aortic atherosclerosis noted. Anterior left fifth rib fracture is new since the prior exam. An adjacent minimally comminuted left sixth rib fracture was present on the prior exam. Correlate with interval trauma. No gross osseous metastasis identified in these areas. Left adrenal nodule is unchanged and favored to represent an adenoma.  He does not have lateral left chest wall pain.  He is not particually SOB at rest or with his normal activities.    ALLERGIES:  has No Known Allergies.  Meds: Current Outpatient Prescriptions  Medication Sig Dispense Refill  . albuterol (VENTOLIN HFA) 108 (90 Base) MCG/ACT inhaler Inhale 2 puffs into the lungs every 6 (six) hours as needed for wheezing or shortness of breath. 3 Inhaler 3  . Ascorbic Acid (VITAMIN C) 1000 MG tablet Take 1,000 mg by mouth daily.    Marland Kitchen BAYER MICROLET LANCETS lancets Use astwice daily to check blood sugar.  DX E11.9 BAYER CONTOUR NEXT EASY PER INSURANCE 100 each 6  . Blood Glucose Monitoring Suppl (CONTOUR BLOOD GLUCOSE SYSTEM) DEVI Use twice daily to check blood sugar.  DX E11.9 1 Device 0  . cetirizine (ZYRTEC) 10 MG tablet Take 10 mg by mouth daily as needed for allergies.     . fenofibrate 160 MG tablet TAKE 1 TABLET BY MOUTH EVERY DAY 90 tablet 1  . glucose blood (BAYER CONTOUR  TEST) test strip Use twice daily to check blood sugar.  DX E11.9 BAYER CONTOUR NEXT EASY PER INSURANCE 100 each 6  . ibuprofen (ADVIL,MOTRIN) 200 MG tablet Take 600 mg by mouth every 6 (six) hours as needed for pain.     . Insulin Glargine (LANTUS SOLOSTAR) 100 UNIT/ML Solostar Pen INJECT 100 UNITS INTO THE SKIN IN MORNING AND INJECT 70 UNITS INTO THE SKIN EVERY EVENING. 135 mL 0  . insulin lispro (HUMALOG) 100 UNIT/ML injection Inject 0.2 mLs (20 Units total) into  the skin 3 (three) times daily before meals. 90 mL 3  . Insulin Pen Needle (B-D ULTRAFINE III SHORT PEN) 31G X 8 MM MISC Use twice daily as directed with lantus 300 each 5  . Krill Oil 300 MG CAPS Take 1 capsule by mouth daily.    Marland Kitchen levothyroxine (SYNTHROID, LEVOTHROID) 75 MCG tablet Take 1 tablet (75 mcg total) by mouth daily before breakfast. 90 tablet 1  . losartan (COZAAR) 50 MG tablet TAKE 1 TABLET(50 MG) BY MOUTH DAILY 90 tablet 0  . metFORMIN (GLUCOPHAGE) 500 MG tablet TAKE 2 TABLETS BY MOUTH TWICE DAILY 120 tablet 0  . Misc Natural Products (OSTEO BI-FLEX TRIPLE STRENGTH PO) Take 2 tablets by mouth daily.    . mometasone (ASMANEX 60 METERED DOSES) 220 MCG/INH inhaler Inhale 1 puff into the lungs 2 (two) times daily. 1 Inhaler 5  . NON FORMULARY 1 each by Other route 4 (four) times daily. Accu-check multiclix lancets- use as directed    . Probiotic Product (PROBIOTIC DAILY PO) Take by mouth.    . ranitidine (ZANTAC) 300 MG tablet TAKE 1 TABLET (300 MG TOTAL) BY MOUTH AT BEDTIME. 90 tablet 1  . rosuvastatin (CRESTOR) 40 MG tablet Take 1 tablet (40 mg total) by mouth daily. 90 tablet 1  . Syringe, Disposable, 3 ML MISC DX: E11.9/E11.65  Use bid as directed 300 each 5  . benzonatate (TESSALON) 100 MG capsule Take 1 capsule (100 mg total) by mouth 3 (three) times daily as needed. (Patient not taking: Reported on 04/24/2016) 40 capsule 0  . promethazine (PHENERGAN) 12.5 MG tablet Take 1 tablet (12.5 mg total) by mouth every 6 (six) hours as needed for nausea or vomiting. (Patient not taking: Reported on 04/24/2016) 15 tablet 0  . traMADol (ULTRAM) 50 MG tablet Take 1 tablet (50 mg total) by mouth every 6 (six) hours as needed for severe pain. (Patient not taking: Reported on 04/24/2016) 20 tablet 0   No current facility-administered medications for this encounter.    Physical Findings: The patient is in no acute distress. Patient is alert and oriented.  height is '6\' 6"'$  (1.981 m) and weight is  319 lb 3.2 oz (144.8 kg) (abnormal). His temperature is 97.7 F (36.5 C). His blood pressure is 145/89 (abnormal) and his pulse is 87. His oxygen saturation is 96%.  General: Alert and oriented, in no acute distress. HEENT: Oropharynx and oral cavity are clear. Neck: Neck is supple, no palpable cervical or supraclavicular lymphadenopathy or masses. Heart: Regular in rate and rhythm with no murmurs. Chest: Clear to auscultation bilaterally. Lymphatics: see Neck Exam Musculoskeletal: Some tenderness in left lateral chest wall upon palpation.   Lab Findings: Lab Results  Component Value Date   WBC 4.9 01/06/2016   HGB 14.0 01/06/2016   HCT 41.3 01/06/2016   MCV 87.3 01/06/2016   PLT 197.0 01/06/2016   Lab Results  Component Value Date   TSH 3.71 01/06/2016   CMP  Component Value Date/Time   NA 139 01/06/2016 0840   K 4.2 01/06/2016 0840   CL 105 01/06/2016 0840   CO2 27 01/06/2016 0840   GLUCOSE 191 (H) 01/06/2016 0840   BUN 12 01/06/2016 0840   BUN 18.9 01/10/2015 0821   CREATININE 1.02 01/06/2016 0840   CREATININE 1.1 01/10/2015 0821   CALCIUM 9.3 01/06/2016 0840   PROT 6.2 01/06/2016 0840   ALBUMIN 3.7 01/06/2016 0840   AST 14 01/06/2016 0840   ALT 22 01/06/2016 0840   ALKPHOS 40 01/06/2016 0840   BILITOT 0.5 01/06/2016 0840   GFRNONAA >60 11/19/2015 0242   GFRAA >60 11/19/2015 0242   Lab Results  Component Value Date   TSH 3.71 01/06/2016     Radiographic Findings: IMAGES REVIEWED WITH PATIENT Ct Chest Wo Contrast  Result Date: 04/23/2016 CLINICAL DATA:  Bilateral lung cancer diagnosed in 2015. Radiation therapy 11/16. Neck dissection 2013. Lung metastasis, status post SBRT. EXAM: CT CHEST WITHOUT CONTRAST TECHNIQUE: Multidetector CT imaging of the chest was performed following the standard protocol without IV contrast. COMPARISON:  Plain films 01/22/2016.  Most recent CT of 12/16/2015. FINDINGS: Cardiovascular: Normal heart size, without pericardial  effusion. Multivessel coronary artery atherosclerosis. Mediastinum/Nodes: No supraclavicular adenopathy. No mediastinal or definite hilar adenopathy, given limitations of unenhanced CT. Lungs/Pleura: Resolution of left-sided pleural fluid with pleural thickening remaining. Persistent radiation fibrosis in the right upper lobe. Residual irregular nodular density measures 1.4 x 1.4 cm on image 55/series 5. Compare 1.8 x 1.5 cm on the prior exam (when remeasured). Similar left lower lobe and posterior left upper lobe radiation fibrosis. A bilobed pleural-based left lower lobe nodule measures 1.6 x 0.9 cm on image 120/series 5. Enlarged from 9 x 6 mm on the prior. Lateral anterior left lower lobe nodule measures 1.6 cm on image 107/series 5. Either new or more conspicuous today. Volume loss in this area on the prior exam somewhat degrades evaluation. A more cephalad and medial left lower lobe nodule measures 1.4 x 1.4 cm on image 105/series 5. This is unchanged (when remeasured). Upper Abdomen: Mild hepatic steatosis. Small gallstones. Normal imaged portions of the spleen, stomach, pancreas, right adrenal gland, and kidneys. Left adrenal nodule is similar at 1.5 cm. This is present back to 02/12/2014 PET, most consistent with a benign etiology. Musculoskeletal: Subcutaneous interstitial thickening about the anterior left chest wall on image 99/series 2 is decreased. This is superficial to the anterior left fifth rib displaced fracture which is new since the prior. A minimally comminuted left lateral sixth rib fracture is similar on the prior. IMPRESSION: 1. Radiation changes in the right upper and left lower lobes. Index right upper lobe lesion is decreased in size. 2. Posterior left lower lobe pulmonary nodule is significantly enlarged, consistent with progressive metastasis. An anterolateral left lower lobe nodule is felt to be new or significantly enlarged, but was possibly partially obscured by volume loss on the  prior exam. A more medial anterior left lower lobe nodule is unchanged. 3. No thoracic adenopathy. 4.  Coronary artery atherosclerosis. Aortic atherosclerosis. 5. Hepatic steatosis. 6. Anterior left fifth rib fracture is new since the prior exam. An adjacent minimally comminuted left sixth rib fracture was present on the prior exam. Correlate with interval trauma. No gross osseous metastasis identified in these areas. 7. Left adrenal nodule is unchanged and favored to represent an adenoma. Electronically Signed   By: Abigail Miyamoto M.D.   On: 04/23/2016 11:13    Impression/Plan:  The patient's recent  CT scan shows enlargement of lesions concerning for at least two new/progressive metastasis. I will refer the patient for PET for restaging of the body and more information on the new lesions, as well as a pulmonary function test to gauge the safety of a third round of SBRT.  We discussed the patient's potential options following scan results, including systemic therapy at [facility]. I would be happy to refer the patient for re-consultation. He is in agreement; I will request an appointment with Dr. Baltazar Najjar following the patient's PET scan.  I have a feeling that ultimately SBRT will be the patient's best option - as long as the the PFT results are satisfactory and the PET doesn't show diffuse metastatic progression.  But the patient is interested in hearing about alternatives and whether any promising trials/ drugs are new on the horizon.  Additionally, I will send a note to Dr. Emilie Rutter to update him on the patient's history. I do not think a Left Lower Lobectomy is warranted at this time.  The patient will hear from me after the PET results are in.  I spent 25 minutes face to face with the patient, over 50% of which was spent on counseling and care coordination. _____________________________________   [name], MD  This document serves as a record of services personally performed by [name], MD. It was created on her behalf by Maryla Morrow, a trained medical scribe. The creation of this record is based on the scribe's personal observations and the provider's statements to them. This document has been checked and approved by the attending provider.

## 2016-04-24 NOTE — Progress Notes (Signed)
Mr. Nauta presents for follow up of radiation completed 05/2014 and 02/20/15 to his Right and Left Lungs. He denies pain at this time, but reports pain over his left nipple area when he reaches for the floor and stands back up. He has some leg weakness on certain days and then feels better other days. He is using a new CPAP at night which has improved his breathing at night. He is eating well. He is here to get his CT Chest from yesterday results.   BP (!) 145/89   Pulse 87   Temp 97.7 F (36.5 C)   Ht '6\' 6"'$  (1.981 m)   Wt (!) 319 lb 3.2 oz (144.8 kg)   SpO2 96%   BMI 36.89 kg/m    Wt Readings from Last 3 Encounters:  04/24/16 (!) 319 lb 3.2 oz (144.8 kg)  03/02/16 (!) 319 lb 9.6 oz (145 kg)  02/26/16 (!) 324 lb 3.2 oz (147.1 kg)

## 2016-04-27 ENCOUNTER — Other Ambulatory Visit: Payer: Self-pay | Admitting: Radiation Oncology

## 2016-04-27 DIAGNOSIS — C7802 Secondary malignant neoplasm of left lung: Secondary | ICD-10-CM

## 2016-04-27 DIAGNOSIS — C7801 Secondary malignant neoplasm of right lung: Secondary | ICD-10-CM

## 2016-05-07 ENCOUNTER — Telehealth: Payer: Self-pay | Admitting: *Deleted

## 2016-05-07 NOTE — Telephone Encounter (Signed)
CALLED PATIENT TO INFORM OF PET SCAN ON 05-11-16 - ARRIVAL TIME - 7 AM, PT. TO BE NPO- AFTERMIDNIGHT, PT. TO HOLD MORNING DOSE OF METFOR., LVM FOR A RETURN CALL

## 2016-05-07 NOTE — Telephone Encounter (Signed)
CALLED PT. TO INFORM OF APPT. WITH DR. Baltazar Najjar ON 05-22-16 - ARRIVAL TIME - 3:15 PM , SPOKE WITH PATIENT AND HE IS AWARE OF THIS APPT.

## 2016-05-11 ENCOUNTER — Encounter (HOSPITAL_COMMUNITY)
Admission: RE | Admit: 2016-05-11 | Discharge: 2016-05-11 | Disposition: A | Discharge status: Home / Self Care | Payer: BLUE CROSS/BLUE SHIELD | Source: Ambulatory Visit | Attending: Radiation Oncology | Admitting: Radiation Oncology

## 2016-05-11 DIAGNOSIS — C7802 Secondary malignant neoplasm of left lung: Secondary | ICD-10-CM | POA: Insufficient documentation

## 2016-05-11 DIAGNOSIS — C7801 Secondary malignant neoplasm of right lung: Secondary | ICD-10-CM | POA: Diagnosis present

## 2016-05-11 LAB — GLUCOSE, CAPILLARY: Glucose-Capillary: 240 mg/dL — ABNORMAL HIGH (ref 65–99)

## 2016-05-11 MED ORDER — FLUDEOXYGLUCOSE F - 18 (FDG) INJECTION
15.9000 | Freq: Once | INTRAVENOUS | Status: AC | PRN
  Administered 2016-05-11: 15.9 via INTRAVENOUS

## 2016-05-12 ENCOUNTER — Ambulatory Visit (HOSPITAL_COMMUNITY)
Admission: RE | Admit: 2016-05-12 | Discharge: 2016-05-12 | Disposition: A | Discharge status: Home / Self Care | Payer: BLUE CROSS/BLUE SHIELD | Source: Ambulatory Visit | Attending: Radiation Oncology | Admitting: Radiation Oncology

## 2016-05-12 ENCOUNTER — Telehealth: Payer: Self-pay | Admitting: Radiation Oncology

## 2016-05-12 DIAGNOSIS — C7801 Secondary malignant neoplasm of right lung: Secondary | ICD-10-CM | POA: Diagnosis present

## 2016-05-12 DIAGNOSIS — C7802 Secondary malignant neoplasm of left lung: Secondary | ICD-10-CM

## 2016-05-12 LAB — PULMONARY FUNCTION TEST
DL/VA % pred: 75 %
DL/VA: 3.78 ml/min/mmHg/L
DLCO unc % pred: 55 %
DLCO unc: 24.1 ml/min/mmHg
FEF 25-75 Post: 3.05 L/sec
FEF 25-75 Pre: 2.41 L/sec
FEF2575-%CHANGE-POST: 26 %
FEF2575-%PRED-PRE: 63 %
FEF2575-%Pred-Post: 80 %
FEV1-%CHANGE-POST: 5 %
FEV1-%Pred-Post: 72 %
FEV1-%Pred-Pre: 68 %
FEV1-PRE: 3.21 L
FEV1-Post: 3.4 L
FEV1FVC-%CHANGE-POST: 2 %
FEV1FVC-%Pred-Pre: 96 %
FEV6-%Change-Post: 4 %
FEV6-%PRED-PRE: 72 %
FEV6-%Pred-Post: 76 %
FEV6-PRE: 4.35 L
FEV6-Post: 4.53 L
FEV6FVC-%Change-Post: 0 %
FEV6FVC-%PRED-PRE: 103 %
FEV6FVC-%Pred-Post: 104 %
FVC-%CHANGE-POST: 3 %
FVC-%PRED-PRE: 70 %
FVC-%Pred-Post: 72 %
FVC-POST: 4.53 L
FVC-PRE: 4.36 L
POST FEV6/FVC RATIO: 100 %
PRE FEV6/FVC RATIO: 100 %
Post FEV1/FVC ratio: 75 %
Pre FEV1/FVC ratio: 74 %
RV % PRED: 99 %
RV: 2.66 L
TLC % pred: 83 %
TLC: 7.15 L

## 2016-05-12 MED ORDER — ALBUTEROL SULFATE (2.5 MG/3ML) 0.083% IN NEBU
2.5000 mg | INHALATION_SOLUTION | Freq: Once | RESPIRATORY_TRACT | Status: AC
  Administered 2016-05-12: 2.5 mg via RESPIRATORY_TRACT

## 2016-05-22 ENCOUNTER — Other Ambulatory Visit: Payer: Self-pay | Admitting: Family Medicine

## 2016-05-22 NOTE — Telephone Encounter (Signed)
On 05-12-16 I talked with Mr. Steven Ibarra about his PET results and told him I think SBRT will probably be his best form of salvage therapy given his limited sites of active disease; but, he is advised to still see Dr Baltazar Najjar as scheduled.   Steven Ibarra knows to call me after his appt with Dr Baltazar Najjar so that we can regroup with a plan  -----------------------------------  [name], MD

## 2016-06-12 ENCOUNTER — Telehealth: Payer: Self-pay | Admitting: Family Medicine

## 2016-06-12 MED ORDER — INSULIN GLARGINE 100 UNIT/ML SOLOSTAR PEN: PEN_INJECTOR | SUBCUTANEOUS | 1 refills | Status: DC

## 2016-06-12 NOTE — Telephone Encounter (Signed)
Sent in with note for pharmacy to fill enough for a #90 day supply. Patient is aware

## 2016-06-12 NOTE — Telephone Encounter (Signed)
Caller name: Relationship to patient: Self Can be reached: [phone]  Pharmacy:  [facility] [place]. [place] [phone] (Phone) [phone] (Fax)     Reason for call: Request 90 day supply of Insulin Glargine (LANTUS SOLOSTAR) 100 UNIT/ML Solostar Pen [[id number]

## 2016-06-17 ENCOUNTER — Encounter: Payer: Self-pay | Admitting: Medical

## 2016-06-17 ENCOUNTER — Ambulatory Visit (INDEPENDENT_AMBULATORY_CARE_PROVIDER_SITE_OTHER): Payer: BLUE CROSS/BLUE SHIELD | Admitting: Medical

## 2016-06-17 ENCOUNTER — Telehealth: Payer: Self-pay | Admitting: Medical

## 2016-06-17 ENCOUNTER — Ambulatory Visit (HOSPITAL_BASED_OUTPATIENT_CLINIC_OR_DEPARTMENT_OTHER)
Admission: RE | Admit: 2016-06-17 | Discharge: 2016-06-17 | Disposition: A | Discharge status: Home / Self Care | Payer: BLUE CROSS/BLUE SHIELD | Source: Ambulatory Visit | Attending: Medical | Admitting: Medical

## 2016-06-17 VITALS — BP 139/78 | HR 88 | Temp 97.5°F | Resp 16 | Ht 78.0 in | Wt 323.4 lb

## 2016-06-17 DIAGNOSIS — Z85118 Personal history of other malignant neoplasm of bronchus and lung: Secondary | ICD-10-CM

## 2016-06-17 DIAGNOSIS — J984 Other disorders of lung: Secondary | ICD-10-CM | POA: Diagnosis not present

## 2016-06-17 DIAGNOSIS — S2242XA Multiple fractures of ribs, left side, initial encounter for closed fracture: Secondary | ICD-10-CM

## 2016-06-17 DIAGNOSIS — Z8781 Personal history of (healed) traumatic fracture: Secondary | ICD-10-CM | POA: Diagnosis not present

## 2016-06-17 DIAGNOSIS — R0781 Pleurodynia: Secondary | ICD-10-CM

## 2016-06-17 DIAGNOSIS — C3492 Malignant neoplasm of unspecified part of left bronchus or lung: Secondary | ICD-10-CM

## 2016-06-17 MED ORDER — FAMCICLOVIR 500 MG PO TABS: 500.0000 mg | ORAL_TABLET | Freq: Three times a day (TID) | ORAL | 0 refills | Status: DC

## 2016-06-17 MED ORDER — HYDROCODONE-ACETAMINOPHEN 5-325 MG PO TABS: 1.0000 | ORAL_TABLET | Freq: Four times a day (QID) | ORAL | 0 refills | Status: DC | PRN

## 2016-06-17 NOTE — Telephone Encounter (Signed)
Physician transferred to [facility]

## 2016-06-17 NOTE — Patient Instructions (Addendum)
For pain in rib and fracture history as well as cancer history will get chest xray.  For pain please stop tramadol since not adequate and use norco. Rx advisement given.  I am making famivr available if you get shingles type outbreak as I described. Otherwise don't need to start famvir.  Follow up in 5 days or as needed  I reviewed cxr report and no fracture seen. No report of pneumonia. I reviewed prior ct chest(that did show fracture) and pet scan. Will put in future order ct chest in event pt not responding to norco. Notify ma per result note and pt per my chart.

## 2016-06-17 NOTE — Progress Notes (Signed)
Subjective:    Patient ID: Steven Ibarra, male    DOB: 05/09/1955, 61 y.o.   MRN: [id number]  HPI  Pt in with some left lower rib pain. Pt has history of radiation in this area for cancer.   Pt has history of skin cancer that spread to neck then to lung. Slow indolent course per oncologist I talked with today. Pt has had radiation to chest and then rib fracture in past. Per oncologist he had hemothorax at one pont in the past.  Pt continue to work but he took off today.   Pt is on tramadol. He states recently he has used one tramadol. It did not help.  No coughing. No fever, no chills.  Pt pain in his rib worsened today. In past pain would be present mostly on movement but recently pain at rest. No sob or wheezing.       Review of Systems  Constitutional: Negative for chills, fatigue and fever.  Respiratory: Negative for choking, chest tightness, shortness of breath and wheezing.   Cardiovascular: Negative for chest pain and palpitations.  Gastrointestinal: Negative for abdominal pain.  Musculoskeletal:       Left side rib pain.  Neurological: Negative for dizziness, speech difficulty, weakness and light-headedness.  Hematological: Negative for adenopathy. Does not bruise/bleed easily.  Psychiatric/Behavioral: Negative for behavioral problems and confusion.   Past Medical History:  Diagnosis Date  . ALLERGIC RHINITIS, SEASONAL 10/23/2009  . Basal cell carcinoma of neck 05/28/2011   r suprahyoid, radical neck dissection S/p 6 weeks of targeted radiation therapy   . Cancer ([place])   . CAP (community acquired pneumonia) 03/02/2016  . Costochondritis 09/16/2015  . Diabetes mellitus approx 2003   type 2  . DIABETES MELLITUS, TYPE II 10/23/2009  . DYSPNEA ON EXERTION 10/23/2009  . ELEVATED BLOOD PRESSURE 04/23/2010  . GERD (gastroesophageal reflux disease)   . Hearing loss   . History of radiation therapy 07/06/2011- 08/19/2011   33 Fractions to Right facial nodes through the  right neck and right trigeminal nerve  . History of radiation therapy 05/23/14, 05/25/14, 05/28/14, 05/30/14, 06/01/14   SBRT Right upper lullng mass 54 Gy in 3 fractions, Left lower lung mass and adjacent nodules 50 Gy in 5 fractions  . History of radiation therapy 02/11/2015- 02/20/15   Right lung superior upper lobe 50 Gy in 5 fractions, Left Lung posterior lower lobe 50 Gy in 5 fractions  . Hyperlipidemia   . Hypertension    Does not see a cardiologist, has not had a stress, echo   . Hypothyroid 01/28/2012  . Lung cancer ([place]) 03/08/14   invasive squamous cell carcinoma  . Mixed hyperlipidemia 10/23/2009  . Obesity   . OSA (obstructive sleep apnea)   . Otitis externa of right ear 07/06/2012  . Overweight(278.02) 10/23/2009  . Preventative health care 09/30/2011  . Pulled muscle 07/06/12  . S/P radiation therapy 07/06/11 - 08/19/11   Right Facial and Right Neck Nodes and right Trigeminal  Nerve to Base of Skull/ Total Dose 6600 cGy/ 33 Fractions  . Skin cancer    on nose, 9-10 yrs ago  . SLEEP APNEA, OBSTRUCTIVE 10/23/2009   Sleep study Done at [facility]  . SOB (shortness of breath) 09/16/2015  . Tinea pedis of both feet 03/02/2016  . Type II or unspecified type diabetes mellitus with unspecified complication, uncontrolled    type 2      Social History   Social History  . Marital status:  Married    Spouse name: N/A  . Number of children: N/A  . Years of education: N/A   Occupational History  . Not on file.   Social History Main Topics  . Smoking status: Former Smoker    Packs/day: 1.00    Years: 9.00    Types: Cigarettes    Quit date: 03/31/1975  . Smokeless tobacco: Never Used  . Alcohol use No     Comment: 4 beers a month, 06/12/11 rarely uses now  . Drug use: No  . Sexual activity: Yes   Other Topics Concern  . Not on file   Social History Narrative   Patient is married.   Patient with a history of smoking one pack per day for approximately 29 years from ages of [age]. Patient  denies ever having used smokeless tobacco. Patient with rare use of alcohol.      Mother died at age 31 from congestive heart failure complications. Father died at the age of 7 secondary to a stroke.    Past Surgical History:  Procedure Laterality Date  . LUNG BIOPSY Right 03/08/2014  . RADICAL NECK DISSECTION  05/28/2011   Procedure: RADICAL NECK DISSECTION;  Surgeon: Melissa Montane, MD;  Location: [facility];  Service: ENT;  Laterality: N/A;  Suprahyoid Neck Dissection  . skin cancer removal    . TONSILLECTOMY AND ADENOIDECTOMY      Family History  Problem Relation Age of Onset  . Other Mother     CHF  . Stroke Father   . Hyperlipidemia Father   . Heart disease Father     s/p valve replacement  . Hyperlipidemia Brother   . Obesity Brother   . Other Brother     Back pain  . Hyperlipidemia Brother   . Hypertension Brother   . Other Brother     Panic attacks  . Hyperlipidemia Brother   . Anesthesia problems Neg Hx     No Known Allergies  Current Outpatient Prescriptions on File Prior to Visit  Medication Sig Dispense Refill  . albuterol (VENTOLIN HFA) 108 (90 Base) MCG/ACT inhaler Inhale 2 puffs into the lungs every 6 (six) hours as needed for wheezing or shortness of breath. 3 Inhaler 3  . Ascorbic Acid (VITAMIN C) 1000 MG tablet Take 1,000 mg by mouth daily.    Marland Kitchen BAYER MICROLET LANCETS lancets Use astwice daily to check blood sugar.  DX E11.9 BAYER CONTOUR NEXT EASY PER INSURANCE 100 each 6  . benzonatate (TESSALON) 100 MG capsule Take 1 capsule (100 mg total) by mouth 3 (three) times daily as needed. 40 capsule 0  . Blood Glucose Monitoring Suppl (CONTOUR BLOOD GLUCOSE SYSTEM) DEVI Use twice daily to check blood sugar.  DX E11.9 1 Device 0  . cetirizine (ZYRTEC) 10 MG tablet Take 10 mg by mouth daily as needed for allergies.     . fenofibrate 160 MG tablet TAKE 1 TABLET BY MOUTH EVERY DAY 90 tablet 1  . glucose blood (BAYER CONTOUR TEST) test strip Use twice daily to check blood  sugar.  DX E11.9 BAYER CONTOUR NEXT EASY PER INSURANCE 100 each 6  . ibuprofen (ADVIL,MOTRIN) 200 MG tablet Take 600 mg by mouth every 6 (six) hours as needed for pain.     Marland Kitchen insulin lispro (HUMALOG) 100 UNIT/ML injection Inject 0.2 mLs (20 Units total) into the skin 3 (three) times daily before meals. 90 mL 3  . Insulin Pen Needle (B-D ULTRAFINE III SHORT PEN) 31G X 8 MM  MISC Use twice daily as directed with lantus 300 each 5  . Krill Oil 300 MG CAPS Take 1 capsule by mouth daily.    Marland Kitchen levothyroxine (SYNTHROID, LEVOTHROID) 75 MCG tablet Take 1 tablet (75 mcg total) by mouth daily before breakfast. 90 tablet 1  . losartan (COZAAR) 50 MG tablet TAKE 1 TABLET(50 MG) BY MOUTH DAILY 90 tablet 0  . metFORMIN (GLUCOPHAGE) 500 MG tablet TAKE 2 TABLETS BY MOUTH TWICE DAILY 120 tablet 0  . Misc Natural Products (OSTEO BI-FLEX TRIPLE STRENGTH PO) Take 2 tablets by mouth daily.    . mometasone (ASMANEX 60 METERED DOSES) 220 MCG/INH inhaler Inhale 1 puff into the lungs 2 (two) times daily. 1 Inhaler 5  . NON FORMULARY 1 each by Other route 4 (four) times daily. Accu-check multiclix lancets- use as directed    . Probiotic Product (PROBIOTIC DAILY PO) Take by mouth.    . promethazine (PHENERGAN) 12.5 MG tablet Take 1 tablet (12.5 mg total) by mouth every 6 (six) hours as needed for nausea or vomiting. 15 tablet 0  . ranitidine (ZANTAC) 300 MG tablet TAKE 1 TABLET(300 MG) BY MOUTH AT BEDTIME 90 tablet 0  . rosuvastatin (CRESTOR) 40 MG tablet Take 1 tablet (40 mg total) by mouth daily. 90 tablet 1  . Syringe, Disposable, 3 ML MISC DX: E11.9/E11.65  Use bid as directed 300 each 5  . traMADol (ULTRAM) 50 MG tablet Take 1 tablet (50 mg total) by mouth every 6 (six) hours as needed for severe pain. 20 tablet 0  . Insulin Glargine (LANTUS SOLOSTAR) 100 UNIT/ML Solostar Pen INJECT 100 UNITS INTO THE SKIN IN MORNING AND INJECT 70 UNITS INTO THE SKIN EVERY EVENING. 135 mL 1   No current facility-administered  medications on file prior to visit.     BP 139/78 (BP Location: Left Arm, Cuff Size: Large)   Pulse 88   Temp 97.5 F (36.4 C) (Oral)   Resp 16   Ht '6\' 6"'$  (1.981 m)   Wt (!) 323 lb 6.4 oz (146.7 kg)   SpO2 97%   BMI 37.37 kg/m       Objective:   Physical Exam  General- No acute distress. Pleasant patient. Neck- Full range of motion, no jvd. No tracheal deviation. Lungs- Clear, even and unlabored. Heart- regular rate and rhythm. Neurologic- CNII- XII grossly intact.  Left side thorax- faint redness left lower rib area that extends toward his back. The area not warm to touch. No break down and no vescile eruption. Rib region lower area tender to palpaton. Movement of thorax increases pain.       Assessment & Plan:  For pain in rib and fracture history as well as cancer history will get chest xray.  For pain please stop tramadol since not adequate and use norco. Rx advisement given.  I am making famivr available if you get shingles type outbreak as I described. Otherwise don't need to start famvir.  Follow up in 5 days or as needed  I reviewed cxr report and no fracture seen. No report of pneumonia. I reviewed prior ct chest(that did show fracture) and pet scan. Will put in future order ct chest in event pt not responding to norco.  Alexine Pilant, Percell Miller, PA-C

## 2016-06-17 NOTE — Telephone Encounter (Signed)
Future order ct chest order placed.

## 2016-06-17 NOTE — Progress Notes (Signed)
Pre visit review using our clinic review tool, if applicable. No additional management support is needed unless otherwise documented below in the visit note. 

## 2016-06-17 NOTE — Telephone Encounter (Signed)
I have called 3 times and keep getting busy signal. Will you call that number and see if you can get through. Then come get me. I am running behing and want to go ahead and talk to this MD.

## 2016-06-17 NOTE — Telephone Encounter (Signed)
Caller name:Jennifer w/ [facility] Relationship to patient: Can be reached:[phone] Pharmacy:  Reason for call:Patient is scheduled to see you today at 3:30, Dr Judson Roch Squire(oncologist) is requesting to speak to you. Please call her cell, number listed above

## 2016-06-18 ENCOUNTER — Ambulatory Visit (HOSPITAL_BASED_OUTPATIENT_CLINIC_OR_DEPARTMENT_OTHER)
Admission: RE | Admit: 2016-06-18 | Discharge: 2016-06-18 | Disposition: A | Discharge status: Home / Self Care | Payer: BLUE CROSS/BLUE SHIELD | Source: Ambulatory Visit | Attending: Medical | Admitting: Medical

## 2016-06-18 DIAGNOSIS — I2510 Atherosclerotic heart disease of native coronary artery without angina pectoris: Secondary | ICD-10-CM | POA: Diagnosis not present

## 2016-06-18 DIAGNOSIS — Y842 Radiological procedure and radiotherapy as the cause of abnormal reaction of the patient, or of later complication, without mention of misadventure at the time of the procedure: Secondary | ICD-10-CM | POA: Diagnosis not present

## 2016-06-18 DIAGNOSIS — R0781 Pleurodynia: Secondary | ICD-10-CM | POA: Diagnosis present

## 2016-06-18 DIAGNOSIS — C3492 Malignant neoplasm of unspecified part of left bronchus or lung: Secondary | ICD-10-CM | POA: Diagnosis not present

## 2016-06-18 DIAGNOSIS — M4854XA Collapsed vertebra, not elsewhere classified, thoracic region, initial encounter for fracture: Secondary | ICD-10-CM | POA: Diagnosis not present

## 2016-06-18 DIAGNOSIS — S2242XA Multiple fractures of ribs, left side, initial encounter for closed fracture: Secondary | ICD-10-CM | POA: Insufficient documentation

## 2016-06-19 ENCOUNTER — Encounter: Payer: Self-pay | Admitting: Radiation Oncology

## 2016-06-19 NOTE — Progress Notes (Signed)
Patient is following up with Dr Baltazar Najjar today after reconsulting with him last month. Dr. Baltazar Najjar informed me by phone after reconsultation that the plan was to try immunotherapy initially for his current disease, and refer patient back to me later for consideration of SBRT to follow (if warranted).  -----------------------------------  Steven Gibson, MD

## 2016-07-08 ENCOUNTER — Other Ambulatory Visit: Payer: Self-pay

## 2016-07-08 MED ORDER — INSULIN LISPRO 100 UNIT/ML ~~LOC~~ SOLN: 20.0000 [IU] | Freq: Three times a day (TID) | SUBCUTANEOUS | 3 refills | Status: DC

## 2016-07-13 ENCOUNTER — Other Ambulatory Visit: Payer: Self-pay

## 2016-07-13 MED ORDER — TRAMADOL HCL 50 MG PO TABS: 50.0000 mg | ORAL_TABLET | Freq: Four times a day (QID) | ORAL | 0 refills | Status: DC | PRN

## 2016-07-13 NOTE — Telephone Encounter (Signed)
Faxed a hard copy of Rx request to [facility] ([place], [place])

## 2016-07-13 NOTE — Telephone Encounter (Signed)
Received refill request for Tramadol '50mg'$  (take 1 tab po q6h prn for severe pain) #20, 0RF  Last RF: 04/03/2016 Last OV: Missed last scheduled visit with PCP 05/03/2016, but saw another Provider on 06/17/2016. UDS: None  Forwarded to Provider for review, approval or denial.

## 2016-07-13 NOTE — Telephone Encounter (Signed)
He can have #20 again but he needs a UDS, contract and an appt in next month to keep this medicine active in his chart if he has not been seen by me in past 6 months

## 2016-07-22 ENCOUNTER — Telehealth: Payer: BLUE CROSS/BLUE SHIELD | Admitting: Family

## 2016-07-22 DIAGNOSIS — J029 Acute pharyngitis, unspecified: Secondary | ICD-10-CM

## 2016-07-22 DIAGNOSIS — J329 Chronic sinusitis, unspecified: Secondary | ICD-10-CM

## 2016-07-22 DIAGNOSIS — B9789 Other viral agents as the cause of diseases classified elsewhere: Secondary | ICD-10-CM

## 2016-07-22 MED ORDER — FLUTICASONE PROPIONATE 50 MCG/ACT NA SUSP: 2.0000 | Freq: Every day | NASAL | 6 refills | Status: DC

## 2016-07-22 NOTE — Progress Notes (Signed)

## 2016-07-27 ENCOUNTER — Other Ambulatory Visit: Payer: Self-pay | Admitting: Family Medicine

## 2016-07-27 DIAGNOSIS — J189 Pneumonia, unspecified organism: Secondary | ICD-10-CM

## 2016-08-07 ENCOUNTER — Ambulatory Visit (INDEPENDENT_AMBULATORY_CARE_PROVIDER_SITE_OTHER): Payer: BLUE CROSS/BLUE SHIELD | Admitting: Family Medicine

## 2016-08-07 ENCOUNTER — Encounter: Payer: Self-pay | Admitting: Family Medicine

## 2016-08-07 VITALS — BP 130/80 | HR 93 | Temp 97.5°F | Ht 78.0 in | Wt 327.0 lb

## 2016-08-07 DIAGNOSIS — I10 Essential (primary) hypertension: Secondary | ICD-10-CM | POA: Diagnosis not present

## 2016-08-07 DIAGNOSIS — S81801A Unspecified open wound, right lower leg, initial encounter: Secondary | ICD-10-CM

## 2016-08-07 NOTE — Progress Notes (Signed)
Chief Complaint  Patient presents with  . Hypertension    elevated BP   . Sore on leg    on and off x 2 weeks ago-(B)-(R) lower leg front and back    Subjective Steven Ibarra is a 61 y.o. male who presents for hypertension follow up. He does not monitor home blood pressures. Went to specialist office where they use machine, keeps getting high readings. He is compliant with medications- Losartan 50 mg daily. Patient has these side effects of medication: none He is adhering to a healthy diet overall. Current exercise: walking a little  He has been hitting his shins and having subsequent sores that keep popping up. They seem to heal until he bangs it again. No fevers, streaking redness, drainage, odor, pain. He does still have sensation in his lower extremities.   Past Medical History:  Diagnosis Date  . ALLERGIC RHINITIS, SEASONAL 10/23/2009  . Basal cell carcinoma of neck 05/28/2011   r suprahyoid, radical neck dissection S/p 6 weeks of targeted radiation therapy   . Cancer ([place])   . CAP (community acquired pneumonia) 03/02/2016  . Costochondritis 09/16/2015  . Diabetes mellitus approx 2003   type 2  . DIABETES MELLITUS, TYPE II 10/23/2009  . DYSPNEA ON EXERTION 10/23/2009  . ELEVATED BLOOD PRESSURE 04/23/2010  . GERD (gastroesophageal reflux disease)   . Hearing loss   . History of radiation therapy 07/06/2011- 08/19/2011   33 Fractions to Right facial nodes through the right neck and right trigeminal nerve  . History of radiation therapy 05/23/14, 05/25/14, 05/28/14, 05/30/14, 06/01/14   SBRT Right upper lullng mass 54 Gy in 3 fractions, Left lower lung mass and adjacent nodules 50 Gy in 5 fractions  . History of radiation therapy 02/11/2015- 02/20/15   Right lung superior upper lobe 50 Gy in 5 fractions, Left Lung posterior lower lobe 50 Gy in 5 fractions  . Hyperlipidemia   . Hypertension    Does not see a cardiologist, has not had a stress, echo   . Hypothyroid 01/28/2012  . Lung  cancer ([place]) 03/08/14   invasive squamous cell carcinoma  . Mixed hyperlipidemia 10/23/2009  . Obesity   . OSA (obstructive sleep apnea)   . Otitis externa of right ear 07/06/2012  . Overweight(278.02) 10/23/2009  . Preventative health care 09/30/2011  . Pulled muscle 07/06/12  . S/P radiation therapy 07/06/11 - 08/19/11   Right Facial and Right Neck Nodes and right Trigeminal  Nerve to Base of Skull/ Total Dose 6600 cGy/ 33 Fractions  . Skin cancer    on nose, 9-10 yrs ago  . SLEEP APNEA, OBSTRUCTIVE 10/23/2009   [facility] at [facility]  . SOB (shortness of breath) 09/16/2015  . Tinea pedis of both feet 03/02/2016  . Type II or unspecified type diabetes mellitus with unspecified complication, uncontrolled    type 2    Family History  Problem Relation Age of Onset  . Other Mother        CHF  . Stroke Father   . Hyperlipidemia Father   . Heart disease Father        s/p valve replacement  . Hyperlipidemia Brother   . Obesity Brother   . Other Brother        Back pain  . Hyperlipidemia Brother   . Hypertension Brother   . Other Brother        Panic attacks  . Hyperlipidemia Brother   . Anesthesia problems Neg Hx  Medications Current Outpatient Prescriptions on File Prior to Visit  Medication Sig Dispense Refill  . albuterol (VENTOLIN HFA) 108 (90 Base) MCG/ACT inhaler Inhale 2 puffs into the lungs every 6 (six) hours as needed for wheezing or shortness of breath. 3 Inhaler 3  . Ascorbic Acid (VITAMIN C) 1000 MG tablet Take 1,000 mg by mouth daily.    Marland Kitchen BAYER MICROLET LANCETS lancets Use astwice daily to check blood sugar.  DX E11.9 BAYER CONTOUR NEXT EASY PER INSURANCE 100 each 6  . benzonatate (TESSALON) 100 MG capsule TAKE 1 CAPSULE(100 MG) BY MOUTH THREE TIMES DAILY AS NEEDED 40 capsule 0  . Blood Glucose Monitoring Suppl (CONTOUR BLOOD GLUCOSE SYSTEM) DEVI Use twice daily to check blood sugar.  DX E11.9 1 Device 0  . cetirizine (ZYRTEC) 10 MG tablet Take 10 mg by mouth  daily as needed for allergies.     . famciclovir (FAMVIR) 500 MG tablet Take 1 tablet (500 mg total) by mouth 3 (three) times daily. 21 tablet 0  . fenofibrate 160 MG tablet TAKE 1 TABLET BY MOUTH EVERY DAY 90 tablet 1  . fluticasone (FLONASE) 50 MCG/ACT nasal spray Place 2 sprays into both nostrils daily. 16 g 6  . glucose blood (BAYER CONTOUR TEST) test strip Use twice daily to check blood sugar.  DX E11.9 BAYER CONTOUR NEXT EASY PER INSURANCE 100 each 6  . HYDROcodone-acetaminophen (NORCO) 5-325 MG tablet Take 1 tablet by mouth every 6 (six) hours as needed for moderate pain. 20 tablet 0  . ibuprofen (ADVIL,MOTRIN) 200 MG tablet Take 600 mg by mouth every 6 (six) hours as needed for pain.     . Insulin Glargine (LANTUS SOLOSTAR) 100 UNIT/ML Solostar Pen INJECT 100 UNITS INTO THE SKIN IN MORNING AND INJECT 70 UNITS INTO THE SKIN EVERY EVENING. 135 mL 1  . insulin lispro (HUMALOG) 100 UNIT/ML injection Inject 0.2 mLs (20 Units total) into the skin 3 (three) times daily before meals. 90 mL 3  . Insulin Pen Needle (B-D ULTRAFINE III SHORT PEN) 31G X 8 MM MISC Use twice daily as directed with lantus 300 each 5  . Krill Oil 300 MG CAPS Take 1 capsule by mouth daily.    Marland Kitchen levothyroxine (SYNTHROID, LEVOTHROID) 75 MCG tablet Take 1 tablet (75 mcg total) by mouth daily before breakfast. 90 tablet 1  . losartan (COZAAR) 50 MG tablet TAKE 1 TABLET(50 MG) BY MOUTH DAILY 90 tablet 0  . losartan (COZAAR) 50 MG tablet TAKE 1 TABLET(50 MG) BY MOUTH DAILY 90 tablet 0  . metFORMIN (GLUCOPHAGE) 500 MG tablet TAKE 2 TABLETS BY MOUTH TWICE DAILY 120 tablet 0  . Misc Natural Products (OSTEO BI-FLEX TRIPLE STRENGTH PO) Take 2 tablets by mouth daily.    . mometasone (ASMANEX 60 METERED DOSES) 220 MCG/INH inhaler Inhale 1 puff into the lungs 2 (two) times daily. 1 Inhaler 5  . NON FORMULARY 1 each by Other route 4 (four) times daily. Accu-check multiclix lancets- use as directed    . Probiotic Product (PROBIOTIC DAILY  PO) Take by mouth.    . promethazine (PHENERGAN) 12.5 MG tablet Take 1 tablet (12.5 mg total) by mouth every 6 (six) hours as needed for nausea or vomiting. 15 tablet 0  . ranitidine (ZANTAC) 300 MG tablet TAKE 1 TABLET(300 MG) BY MOUTH AT BEDTIME 90 tablet 0  . rosuvastatin (CRESTOR) 40 MG tablet Take 1 tablet (40 mg total) by mouth daily. 90 tablet 1  . Syringe, Disposable, 3 ML MISC  DX: E11.9/E11.65  Use bid as directed 300 each 5  . traMADol (ULTRAM) 50 MG tablet Take 1 tablet (50 mg total) by mouth every 6 (six) hours as needed for severe pain. 20 tablet 0   Allergies No Known Allergies  Review of Systems Cardiovascular: no chest pain Respiratory:  no shortness of breath  Exam BP 130/80 (BP Location: Left Arm, Patient Position: Sitting, Cuff Size: Large)   Pulse 93   Temp 97.5 F (36.4 C) (Oral)   Ht '6\' 6"'$  (1.981 m)   Wt (!) 327 lb (148.3 kg)   SpO2 96%   BMI 37.79 kg/m  General:  well developed, well nourished, in no apparent distress Skin:  1.1x1.6 cm open lesion on R distal anterior LE, otherwise warm, no pallor or diaphoresis Eyes:  pupils equal and round, sclera anicteric without injection Heart :RRR, no murmurs, no bruits, no LE edema Lungs:  clear to auscultation, no accessory muscle use Psych: well oriented with normal range of affect and appropriate judgment/insight  Essential hypertension  Wound of right lower extremity, initial encounter  Recommended home monitoring. Reassured the patient that manual monitoring of blood pressure is more reliable than the machine. Continue on losartan for now.  Counseled on diet and exercise. Recommended mupirocin ointment twice daily for low extremity. Keep clean and dry. Seek care if starting to have fevers, redness, warmth, drainage, foul odor, or worsening pain. F/u in 4 wks w Dr. Charlett Blake. The patient voiced understanding and agreement to the plan.  [place], DO 08/07/16  5:34 PM

## 2016-08-07 NOTE — Patient Instructions (Addendum)
Try to get an upper arm cuff. Wrist is OK if more convenient/cheaper.  Around 3 times per week, check your blood pressure 4 times per day. Twice in the morning and twice in the evening. The readings should be at least one minute apart. Write down these values and bring them to your next nurse visit/appointment.  When you check your BP, make sure you have been doing something calm/relaxing 5 minutes prior to checking. Both feet should be flat on the floor and you should be sitting. Use your left arm and make sure it is in a relaxed position (on a table), and that the cuff is at the approximate level/height of your heart.  If you start having fevers, drainage, foul odor, redness, or worsening pain, seek care.  Put antibiotic ointment on the lesion twice daily for 10 days. Keep the area clean and dry. If the lesion persists for 3 weeks or greater, come back to clinic.

## 2016-08-27 ENCOUNTER — Other Ambulatory Visit: Payer: Self-pay | Admitting: Family Medicine

## 2016-09-06 ENCOUNTER — Other Ambulatory Visit: Payer: Self-pay | Admitting: Family Medicine

## 2016-09-08 ENCOUNTER — Ambulatory Visit (INDEPENDENT_AMBULATORY_CARE_PROVIDER_SITE_OTHER): Payer: BLUE CROSS/BLUE SHIELD | Admitting: Family Medicine

## 2016-09-08 ENCOUNTER — Encounter: Payer: Self-pay | Admitting: Family Medicine

## 2016-09-08 VITALS — BP 120/76 | HR 92 | Temp 97.7°F | Resp 18 | Wt 325.0 lb

## 2016-09-08 DIAGNOSIS — E663 Overweight: Secondary | ICD-10-CM | POA: Diagnosis not present

## 2016-09-08 DIAGNOSIS — C7802 Secondary malignant neoplasm of left lung: Secondary | ICD-10-CM

## 2016-09-08 DIAGNOSIS — F419 Anxiety disorder, unspecified: Secondary | ICD-10-CM

## 2016-09-08 DIAGNOSIS — E039 Hypothyroidism, unspecified: Secondary | ICD-10-CM | POA: Diagnosis not present

## 2016-09-08 DIAGNOSIS — E669 Obesity, unspecified: Secondary | ICD-10-CM | POA: Diagnosis not present

## 2016-09-08 DIAGNOSIS — I10 Essential (primary) hypertension: Secondary | ICD-10-CM

## 2016-09-08 DIAGNOSIS — E782 Mixed hyperlipidemia: Secondary | ICD-10-CM

## 2016-09-08 DIAGNOSIS — M79661 Pain in right lower leg: Secondary | ICD-10-CM | POA: Diagnosis not present

## 2016-09-08 DIAGNOSIS — E1169 Type 2 diabetes mellitus with other specified complication: Secondary | ICD-10-CM

## 2016-09-08 HISTORY — DX: Anxiety disorder, unspecified: F41.9

## 2016-09-08 HISTORY — DX: Pain in right lower leg: M79.661

## 2016-09-08 MED ORDER — ALPRAZOLAM 0.25 MG PO TABS: 0.2500 mg | ORAL_TABLET | Freq: Every day | ORAL | 0 refills | Status: DC | PRN

## 2016-09-08 MED ORDER — LEVOTHYROXINE SODIUM 75 MCG PO TABS: ORAL_TABLET | ORAL | 1 refills | Status: DC

## 2016-09-08 NOTE — Progress Notes (Signed)
Subjective:    Patient ID: Steven Ibarra, male    DOB: 06/14/1955, 61 y.o.   MRN: [id number]  Chief Complaint  Patient presents with  . Follow-up    HPI Patient is in today for Follow-up on a sore on his right lower extremity. He hit the back of his right calf on bar under the seat of his work truck several weeks ago. He came in for evaluation and by following the instructions to apply mupirocin and keep the wound clean it is improving but he has noticed some blisters and redness forming distal to the scab that has formed. No fevers or chills. No malaise. He does endorse some intermittent right calf and right groin pain denies any falls or trauma. Denies CP/palp/SOB/HA/congestion/fevers/GI or GU c/o. Taking meds as prescribed. He notes he feels very anxious when he goes to [facility] for his treatments then his pulse and bp go up and he often feels hot and has racing thoughts.   Past Medical History:  Diagnosis Date  . ALLERGIC RHINITIS, SEASONAL 10/23/2009  . Anxiety 09/08/2016  . Basal cell carcinoma of neck 05/28/2011   r suprahyoid, radical neck dissection S/p 6 weeks of targeted radiation therapy   . Cancer ([place])   . CAP (community acquired pneumonia) 03/02/2016  . Costochondritis 09/16/2015  . Diabetes mellitus approx 2003   type 2  . DIABETES MELLITUS, TYPE II 10/23/2009  . DYSPNEA ON EXERTION 10/23/2009  . ELEVATED BLOOD PRESSURE 04/23/2010  . GERD (gastroesophageal reflux disease)   . Hearing loss   . History of radiation therapy 07/06/2011- 08/19/2011   33 Fractions to Right facial nodes through the right neck and right trigeminal nerve  . History of radiation therapy 05/23/14, 05/25/14, 05/28/14, 05/30/14, 06/01/14   SBRT Right upper lullng mass 54 Gy in 3 fractions, Left lower lung mass and adjacent nodules 50 Gy in 5 fractions  . History of radiation therapy 02/11/2015- 02/20/15   Right lung superior upper lobe 50 Gy in 5 fractions, Left Lung posterior lower lobe 50 Gy in 5 fractions  .  Hyperlipidemia   . Hypertension    Does not see a cardiologist, has not had a stress, echo   . Hypothyroid 01/28/2012  . Lung cancer ([place]) 03/08/14   invasive squamous cell carcinoma  . Mixed hyperlipidemia 10/23/2009  . Obesity   . OSA (obstructive sleep apnea)   . Otitis externa of right ear 07/06/2012  . Overweight(278.02) 10/23/2009  . Preventative health care 09/30/2011  . Pulled muscle 07/06/12  . Right calf pain 09/08/2016  . S/P radiation therapy 07/06/11 - 08/19/11   Right Facial and Right Neck Nodes and right Trigeminal  Nerve to Base of Skull/ Total Dose 6600 cGy/ 33 Fractions  . Skin cancer    on nose, 9-10 yrs ago  . SLEEP APNEA, OBSTRUCTIVE 10/23/2009   Sleep study Done at [facility]  . SOB (shortness of breath) 09/16/2015  . Tinea pedis of both feet 03/02/2016  . Type II or unspecified type diabetes mellitus with unspecified complication, uncontrolled    type 2     Past Surgical History:  Procedure Laterality Date  . LUNG BIOPSY Right 03/08/2014  . RADICAL NECK DISSECTION  05/28/2011   Procedure: RADICAL NECK DISSECTION;  Surgeon: Melissa Montane, MD;  Location: [place];  Service: ENT;  Laterality: N/A;  Suprahyoid Neck Dissection  . skin cancer removal    . TONSILLECTOMY AND ADENOIDECTOMY      Family History  Problem Relation  Age of Onset  . Other Mother        CHF  . Stroke Father   . Hyperlipidemia Father   . Heart disease Father        s/p valve replacement  . Hyperlipidemia Brother   . Obesity Brother   . Other Brother        Back pain  . Hyperlipidemia Brother   . Hypertension Brother   . Other Brother        Panic attacks  . Hyperlipidemia Brother   . Anesthesia problems Neg Hx     Social History   Social History  . Marital status: Married    Spouse name: N/A  . Number of children: N/A  . Years of education: N/A   Occupational History  . Not on file.   Social History Main Topics  . Smoking status: Former Smoker    Packs/day: 1.00    Years: 9.00     Types: Cigarettes    Quit date: 03/31/1975  . Smokeless tobacco: Never Used  . Alcohol use No     Comment: 4 beers a month, 06/12/11 rarely uses now  . Drug use: No  . Sexual activity: Yes   Other Topics Concern  . Not on file   Social History Narrative   Patient is married.   Patient with a history of smoking one pack per day for approximately 29 years from ages of [age]. Patient denies ever having used smokeless tobacco. Patient with rare use of alcohol.      Mother died at age 83 from congestive heart failure complications. Father died at the age of 98 secondary to a stroke.    Outpatient Medications Prior to Visit  Medication Sig Dispense Refill  . albuterol (VENTOLIN HFA) 108 (90 Base) MCG/ACT inhaler Inhale 2 puffs into the lungs every 6 (six) hours as needed for wheezing or shortness of breath. 3 Inhaler 3  . Ascorbic Acid (VITAMIN C) 1000 MG tablet Take 1,000 mg by mouth daily.    Marland Kitchen BAYER MICROLET LANCETS lancets Use astwice daily to check blood sugar.  DX E11.9 BAYER CONTOUR NEXT EASY PER INSURANCE 100 each 6  . benzonatate (TESSALON) 100 MG capsule TAKE 1 CAPSULE(100 MG) BY MOUTH THREE TIMES DAILY AS NEEDED 40 capsule 0  . Blood Glucose Monitoring Suppl (CONTOUR BLOOD GLUCOSE SYSTEM) DEVI Use twice daily to check blood sugar.  DX E11.9 1 Device 0  . cetirizine (ZYRTEC) 10 MG tablet Take 10 mg by mouth daily as needed for allergies.     . famciclovir (FAMVIR) 500 MG tablet Take 1 tablet (500 mg total) by mouth 3 (three) times daily. 21 tablet 0  . fenofibrate 160 MG tablet TAKE 1 TABLET BY MOUTH EVERY DAY 90 tablet 1  . fluticasone (FLONASE) 50 MCG/ACT nasal spray Place 2 sprays into both nostrils daily. 16 g 6  . glucose blood (BAYER CONTOUR TEST) test strip Use twice daily to check blood sugar.  DX E11.9 BAYER CONTOUR NEXT EASY PER INSURANCE 100 each 6  . HYDROcodone-acetaminophen (NORCO) 5-325 MG tablet Take 1 tablet by mouth every 6 (six) hours as needed for moderate pain. 20  tablet 0  . ibuprofen (ADVIL,MOTRIN) 200 MG tablet Take 600 mg by mouth every 6 (six) hours as needed for pain.     . Insulin Glargine (LANTUS SOLOSTAR) 100 UNIT/ML Solostar Pen INJECT 100 UNITS INTO THE SKIN IN MORNING AND INJECT 70 UNITS INTO THE SKIN EVERY EVENING. 135 mL 1  .  insulin lispro (HUMALOG) 100 UNIT/ML injection Inject 0.2 mLs (20 Units total) into the skin 3 (three) times daily before meals. 90 mL 3  . Insulin Pen Needle (B-D ULTRAFINE III SHORT PEN) 31G X 8 MM MISC Use twice daily as directed with lantus 300 each 5  . Krill Oil 300 MG CAPS Take 1 capsule by mouth daily.    Marland Kitchen losartan (COZAAR) 50 MG tablet TAKE 1 TABLET(50 MG) BY MOUTH DAILY 90 tablet 0  . metFORMIN (GLUCOPHAGE) 500 MG tablet TAKE 2 TABLETS BY MOUTH TWICE DAILY 120 tablet 0  . Misc Natural Products (OSTEO BI-FLEX TRIPLE STRENGTH PO) Take 2 tablets by mouth daily.    . mometasone (ASMANEX 60 METERED DOSES) 220 MCG/INH inhaler Inhale 1 puff into the lungs 2 (two) times daily. 1 Inhaler 5  . NON FORMULARY 1 each by Other route 4 (four) times daily. Accu-check multiclix lancets- use as directed    . Probiotic Product (PROBIOTIC DAILY PO) Take by mouth.    . promethazine (PHENERGAN) 12.5 MG tablet Take 1 tablet (12.5 mg total) by mouth every 6 (six) hours as needed for nausea or vomiting. 15 tablet 0  . ranitidine (ZANTAC) 300 MG tablet TAKE 1 TABLET(300 MG) BY MOUTH AT BEDTIME 90 tablet 0  . rosuvastatin (CRESTOR) 40 MG tablet Take 1 tablet (40 mg total) by mouth daily. 90 tablet 1  . Syringe, Disposable, 3 ML MISC DX: E11.9/E11.65  Use bid as directed 300 each 5  . traMADol (ULTRAM) 50 MG tablet Take 1 tablet (50 mg total) by mouth every 6 (six) hours as needed for severe pain. 20 tablet 0  . levothyroxine (SYNTHROID, LEVOTHROID) 75 MCG tablet TAKE 1 TABLET BY MOUTH DAILY BEFORE BREAKFAST 30 tablet 0  . levothyroxine (SYNTHROID, LEVOTHROID) 75 MCG tablet Take 1 tablet (75 mcg total) by mouth daily before breakfast. 90  tablet 1   No facility-administered medications prior to visit.     No Known Allergies  Review of Systems  Constitutional: Negative for fever and malaise/fatigue.  HENT: Negative for congestion.   Eyes: Negative for blurred vision.  Respiratory: Negative for shortness of breath.   Cardiovascular: Negative for chest pain, palpitations and leg swelling.  Gastrointestinal: Negative for abdominal pain, blood in stool and nausea.  Genitourinary: Negative for dysuria and frequency.  Musculoskeletal: Positive for joint pain and myalgias. Negative for falls.  Skin: Negative for rash.       Sore on right posterior calf  Neurological: Negative for dizziness, loss of consciousness and headaches.  Endo/Heme/Allergies: Negative for environmental allergies.  Psychiatric/Behavioral: Negative for depression. The patient is not nervous/anxious.        Objective:    Physical Exam  Constitutional: He is oriented to person, place, and time. He appears well-developed and well-nourished. No distress.  HENT:  Head: Normocephalic and atraumatic.  Nose: Nose normal.  Eyes: Right eye exhibits no discharge. Left eye exhibits no discharge.  Neck: Normal range of motion. Neck supple.  Cardiovascular: Normal rate and regular rhythm.   No murmur heard. Pulmonary/Chest: Effort normal and breath sounds normal.  Abdominal: Soft. Bowel sounds are normal. There is no tenderness.  Musculoskeletal: He exhibits no edema.  1 + pedal edema RLE and trace pedal edema LLE  Neurological: He is alert and oriented to person, place, and time.  Skin: Skin is warm and dry.  Negative Homan's on right. 2 cm hard scab over posterior right calf, with mild erythema and serous blisters below.   Psychiatric:  He has a normal mood and affect.  Nursing note and vitals reviewed.   BP 120/76 (BP Location: Left Arm, Patient Position: Sitting, Cuff Size: Large)   Pulse 92   Temp 97.7 F (36.5 C) (Oral)   Resp 18   Wt (!) 325 lb  (147.4 kg)   SpO2 97%   BMI 37.56 kg/m  Wt Readings from Last 3 Encounters:  09/08/16 (!) 325 lb (147.4 kg)  08/07/16 (!) 327 lb (148.3 kg)  06/17/16 (!) 323 lb 6.4 oz (146.7 kg)     Lab Results  Component Value Date   WBC 4.9 01/06/2016   HGB 14.0 01/06/2016   HCT 41.3 01/06/2016   PLT 197.0 01/06/2016   GLUCOSE 191 (H) 01/06/2016   CHOL 92 01/06/2016   TRIG 263.0 (H) 01/06/2016   HDL 20.30 (L) 01/06/2016   LDLDIRECT 37.0 01/06/2016   LDLCALC 18 09/08/2013   ALT 22 01/06/2016   AST 14 01/06/2016   NA 139 01/06/2016   K 4.2 01/06/2016   CL 105 01/06/2016   CREATININE 1.02 01/06/2016   BUN 12 01/06/2016   CO2 27 01/06/2016   TSH 3.71 01/06/2016   PSA 1.46 09/25/2011   INR 0.95 03/08/2014   HGBA1C 8.1 (H) 01/06/2016   MICROALBUR 0.7 09/10/2015    Lab Results  Component Value Date   TSH 3.71 01/06/2016   Lab Results  Component Value Date   WBC 4.9 01/06/2016   HGB 14.0 01/06/2016   HCT 41.3 01/06/2016   MCV 87.3 01/06/2016   PLT 197.0 01/06/2016   Lab Results  Component Value Date   NA 139 01/06/2016   K 4.2 01/06/2016   CO2 27 01/06/2016   GLUCOSE 191 (H) 01/06/2016   BUN 12 01/06/2016   CREATININE 1.02 01/06/2016   BILITOT 0.5 01/06/2016   ALKPHOS 40 01/06/2016   AST 14 01/06/2016   ALT 22 01/06/2016   PROT 6.2 01/06/2016   ALBUMIN 3.7 01/06/2016   CALCIUM 9.3 01/06/2016   ANIONGAP 5 11/19/2015   EGFR 70 (L) 01/10/2015   GFR 79.39 01/06/2016   Lab Results  Component Value Date   CHOL 92 01/06/2016   Lab Results  Component Value Date   HDL 20.30 (L) 01/06/2016   Lab Results  Component Value Date   LDLCALC 18 09/08/2013   Lab Results  Component Value Date   TRIG 263.0 (H) 01/06/2016   Lab Results  Component Value Date   CHOLHDL 5 01/06/2016   Lab Results  Component Value Date   HGBA1C 8.1 (H) 01/06/2016       Assessment & Plan:   Problem List Items Addressed This Visit    MIXED HYPERLIPIDEMIA    Encouraged heart healthy  diet, increase exercise, avoid trans fats, consider a krill oil cap daily      Relevant Orders   Lipid panel   Overweight    Encouraged DASH diet, decrease po intake and increase exercise as tolerated. Needs 7-8 hours of sleep nightly. Avoid trans fats, eat small, frequent meals every 4-5 hours with lean proteins, complex carbs and healthy fats. Minimize simple carbs bariatric referral given      Hypertension - Primary   Relevant Orders   CBC   Comprehensive metabolic panel   Diabetes mellitus type 2 in obese (HCC)    minimize simple carbs. Increase exercise as tolerated. Continue current meds      Relevant Orders   Hemoglobin A1c   Hypothyroid   Relevant Medications   levothyroxine (  SYNTHROID, LEVOTHROID) 75 MCG tablet   Other Relevant Orders   TSH   Lung metastases ([place])    Receiving Optivo treatments from [facility] every 2 weeks. Tolerating well      Relevant Medications   ALPRAZolam (XANAX) 0.25 MG tablet   Right calf pain    With a sore on back of right calf and some intermittent pain will check a venous ultrasound of leg to rule out DVT.       Anxiety    He has increased anxiety each time he goes to [facility] for his cancer treatments and his BP goes up, his heart and mind race. Will give him a small amount of Alprazolam to use prn during these episodes to see if it is helpful. He is aware of possible fatigue and side effects and his wife accompanies him to appointments      Relevant Medications   ALPRAZolam (XANAX) 0.25 MG tablet    Other Visit Diagnoses    Pain of right calf       Relevant Orders   US Venous Img Lower Unilateral Right      I am having Mr. Brigance start on ALPRAZolam. I am also having him maintain his NON FORMULARY, cetirizine, ibuprofen, Misc Natural Products (OSTEO BI-FLEX TRIPLE STRENGTH PO), vitamin C, Krill Oil, Probiotic Product (PROBIOTIC DAILY PO), glucose blood, BAYER MICROLET LANCETS, CONTOUR BLOOD GLUCOSE SYSTEM, mometasone, rosuvastatin,  promethazine, Insulin Pen Needle, Syringe (Disposable), albuterol, fenofibrate, Insulin Glargine, HYDROcodone-acetaminophen, famciclovir, insulin lispro, traMADol, fluticasone, losartan, benzonatate, ranitidine, metFORMIN, and levothyroxine.  Meds ordered this encounter  Medications  . levothyroxine (SYNTHROID, LEVOTHROID) 75 MCG tablet    Sig: TAKE 1 TABLET BY MOUTH DAILY BEFORE BREAKFAST    Dispense:  90 tablet    Refill:  1  . ALPRAZolam (XANAX) 0.25 MG tablet    Sig: Take 1 tablet (0.25 mg total) by mouth daily as needed for anxiety.    Dispense:  20 tablet    Refill:  0    Penni Homans, MD

## 2016-09-08 NOTE — Assessment & Plan Note (Signed)
He has increased anxiety each time he goes to [facility] for his cancer treatments and his BP goes up, his heart and mind race. Will give him a small amount of Alprazolam to use prn during these episodes to see if it is helpful. He is aware of possible fatigue and side effects and his wife accompanies him to appointments

## 2016-09-08 NOTE — Assessment & Plan Note (Signed)
Receiving Optivo treatments from [facility] every 2 weeks. Tolerating well

## 2016-09-08 NOTE — Patient Instructions (Addendum)
Omron blood pressure monitor Cover with gauze and mupirocin then wrap with an ace wrap while working, open to air at home. Call if worsens or has increased heat, swelling, redness or fevers. Edema Edema is when you have too much fluid in your body or under your skin. Edema may make your legs, feet, and ankles swell up. Swelling is also common in looser tissues, like around your eyes. This is a common condition. It gets more common as you get older. There are many possible causes of edema. Eating too much salt (sodium) and being on your feet or sitting for a long time can cause edema in your legs, feet, and ankles. Hot weather may make edema worse. Edema is usually painless. Your skin may look swollen or shiny. Follow these instructions at home:  Keep the swollen body part raised (elevated) above the level of your heart when you are sitting or lying down.  Do not sit still or stand for a long time.  Do not wear tight clothes. Do not wear garters on your upper legs.  Exercise your legs. This can help the swelling go down.  Wear elastic bandages or support stockings as told by your doctor.  Eat a low-salt (low-sodium) diet to reduce fluid as told by your doctor.  Depending on the cause of your swelling, you may need to limit how much fluid you drink (fluid restriction).  Take over-the-counter and prescription medicines only as told by your doctor. Contact a doctor if:  Treatment is not working.  You have heart, liver, or kidney disease and have symptoms of edema.  You have sudden and unexplained weight gain. Get help right away if:  You have shortness of breath or chest pain.  You cannot breathe when you lie down.  You have pain, redness, or warmth in the swollen areas.  You have heart, liver, or kidney disease and get edema all of a sudden.  You have a fever and your symptoms get worse all of a sudden. Summary  Edema is when you have too much fluid in your body or under your  skin.  Edema may make your legs, feet, and ankles swell up. Swelling is also common in looser tissues, like around your eyes.  Raise (elevate) the swollen body part above the level of your heart when you are sitting or lying down.  Follow your doctor's instructions about diet and how much fluid you can drink (fluid restriction). This information is not intended to replace advice given to you by your health care provider. Make sure you discuss any questions you have with your health care provider. Document Released: 09/02/2007 Document Revised: 04/03/2016 Document Reviewed: 04/03/2016 Elsevier Interactive Patient Education  2017 [employer].

## 2016-09-08 NOTE — Assessment & Plan Note (Signed)
minimize simple carbs. Increase exercise as tolerated. Continue current meds  

## 2016-09-08 NOTE — Assessment & Plan Note (Signed)
Encouraged heart healthy diet, increase exercise, avoid trans fats, consider a krill oil cap daily 

## 2016-09-08 NOTE — Assessment & Plan Note (Signed)
With a sore on back of right calf and some intermittent pain will check a venous ultrasound of leg to rule out DVT.

## 2016-09-08 NOTE — Progress Notes (Signed)
   Subjective:   HPI ROS  Objective:    Physical Exam Assessment & Plan:

## 2016-09-08 NOTE — Assessment & Plan Note (Signed)
Encouraged DASH diet, decrease po intake and increase exercise as tolerated. Needs 7-8 hours of sleep nightly. Avoid trans fats, eat small, frequent meals every 4-5 hours with lean proteins, complex carbs and healthy fats. Minimize simple carbs bariatric referral given

## 2016-09-10 ENCOUNTER — Ambulatory Visit: Payer: BLUE CROSS/BLUE SHIELD | Admitting: Family Medicine

## 2016-09-10 ENCOUNTER — Ambulatory Visit (HOSPITAL_BASED_OUTPATIENT_CLINIC_OR_DEPARTMENT_OTHER)
Admission: RE | Admit: 2016-09-10 | Discharge: 2016-09-10 | Disposition: A | Discharge status: Home / Self Care | Payer: BLUE CROSS/BLUE SHIELD | Source: Ambulatory Visit | Attending: Family Medicine | Admitting: Family Medicine

## 2016-09-10 DIAGNOSIS — M79661 Pain in right lower leg: Secondary | ICD-10-CM

## 2016-09-22 ENCOUNTER — Other Ambulatory Visit (INDEPENDENT_AMBULATORY_CARE_PROVIDER_SITE_OTHER): Payer: BLUE CROSS/BLUE SHIELD

## 2016-09-22 DIAGNOSIS — I10 Essential (primary) hypertension: Secondary | ICD-10-CM

## 2016-09-22 DIAGNOSIS — E669 Obesity, unspecified: Secondary | ICD-10-CM | POA: Diagnosis not present

## 2016-09-22 DIAGNOSIS — E782 Mixed hyperlipidemia: Secondary | ICD-10-CM | POA: Diagnosis not present

## 2016-09-22 DIAGNOSIS — E1169 Type 2 diabetes mellitus with other specified complication: Secondary | ICD-10-CM | POA: Diagnosis not present

## 2016-09-22 DIAGNOSIS — E039 Hypothyroidism, unspecified: Secondary | ICD-10-CM | POA: Diagnosis not present

## 2016-09-22 LAB — COMPREHENSIVE METABOLIC PANEL
ALT: 21 U/L (ref 0–53)
AST: 16 U/L (ref 0–37)
Albumin: 3.9 g/dL (ref 3.5–5.2)
Alkaline Phosphatase: 35 U/L — ABNORMAL LOW (ref 39–117)
BILIRUBIN TOTAL: 0.5 mg/dL (ref 0.2–1.2)
BUN: 14 mg/dL (ref 6–23)
CALCIUM: 9.1 mg/dL (ref 8.4–10.5)
CO2: 28 mEq/L (ref 19–32)
Chloride: 105 mEq/L (ref 96–112)
Creatinine, Ser: 0.95 mg/dL (ref 0.40–1.50)
GFR: 85.97 mL/min (ref >60.00)
Glucose, Bld: 193 mg/dL — ABNORMAL HIGH (ref 70–99)
POTASSIUM: 4.2 meq/L (ref 3.5–5.1)
Sodium: 140 mEq/L (ref 135–145)
TOTAL PROTEIN: 6.1 g/dL (ref 6.0–8.3)

## 2016-09-22 LAB — CBC
HEMATOCRIT: 39.8 % (ref 39.0–52.0)
HEMOGLOBIN: 13.8 g/dL (ref 13.0–17.0)
MCHC: 34.8 g/dL (ref 30.0–36.0)
MCV: 85.9 fl (ref 78.0–100.0)
PLATELETS: 167 10*3/uL (ref 150.0–400.0)
RBC: 4.63 Mil/uL (ref 4.22–5.81)
RDW: 14.6 % (ref 11.5–15.5)
WBC: 4.6 10*3/uL (ref 4.0–10.5)

## 2016-09-22 LAB — LIPID PANEL
Cholesterol: 103 mg/dL (ref 0–200)
HDL: 21.4 mg/dL — AB (ref >39.00)
NonHDL: 82
TRIGLYCERIDES: 298 mg/dL — AB (ref 0.0–149.0)
Total CHOL/HDL Ratio: 5
VLDL: 59.6 mg/dL — AB (ref 0.0–40.0)

## 2016-09-22 LAB — TSH: TSH: 4.86 u[IU]/mL — ABNORMAL HIGH (ref 0.35–4.50)

## 2016-09-22 LAB — LDL CHOLESTEROL, DIRECT: Direct LDL: 41 mg/dL

## 2016-09-22 LAB — HEMOGLOBIN A1C: HEMOGLOBIN A1C: 8.9 % — AB (ref 4.6–6.5)

## 2016-09-24 ENCOUNTER — Other Ambulatory Visit (INDEPENDENT_AMBULATORY_CARE_PROVIDER_SITE_OTHER): Payer: BLUE CROSS/BLUE SHIELD

## 2016-09-24 DIAGNOSIS — R946 Abnormal results of thyroid function studies: Secondary | ICD-10-CM | POA: Diagnosis not present

## 2016-09-24 LAB — T4, FREE: Free T4: 0.65 ng/dL (ref 0.60–1.60)

## 2016-09-24 NOTE — Progress Notes (Signed)
thanks

## 2016-09-28 ENCOUNTER — Encounter: Payer: Self-pay | Admitting: Family Medicine

## 2016-09-28 ENCOUNTER — Ambulatory Visit (INDEPENDENT_AMBULATORY_CARE_PROVIDER_SITE_OTHER): Payer: BLUE CROSS/BLUE SHIELD | Admitting: Family Medicine

## 2016-09-28 DIAGNOSIS — E1169 Type 2 diabetes mellitus with other specified complication: Secondary | ICD-10-CM | POA: Diagnosis not present

## 2016-09-28 DIAGNOSIS — E669 Obesity, unspecified: Secondary | ICD-10-CM

## 2016-09-28 DIAGNOSIS — Z9989 Dependence on other enabling machines and devices: Secondary | ICD-10-CM

## 2016-09-28 DIAGNOSIS — I10 Essential (primary) hypertension: Secondary | ICD-10-CM | POA: Diagnosis not present

## 2016-09-28 DIAGNOSIS — E039 Hypothyroidism, unspecified: Secondary | ICD-10-CM

## 2016-09-28 DIAGNOSIS — C4490 Unspecified malignant neoplasm of skin, unspecified: Secondary | ICD-10-CM | POA: Diagnosis not present

## 2016-09-28 DIAGNOSIS — R600 Localized edema: Secondary | ICD-10-CM | POA: Diagnosis not present

## 2016-09-28 DIAGNOSIS — E782 Mixed hyperlipidemia: Secondary | ICD-10-CM

## 2016-09-28 DIAGNOSIS — G4733 Obstructive sleep apnea (adult) (pediatric): Secondary | ICD-10-CM | POA: Diagnosis not present

## 2016-09-28 HISTORY — DX: Localized edema: R60.0

## 2016-09-28 MED ORDER — SYRINGE (DISPOSABLE) 3 ML MISC: 5 refills | Status: DC

## 2016-09-28 MED ORDER — LEVOTHYROXINE SODIUM 75 MCG PO TABS: ORAL_TABLET | ORAL | 1 refills | Status: DC

## 2016-09-28 MED ORDER — INSULIN LISPRO 100 UNIT/ML ~~LOC~~ SOLN: 22.0000 [IU] | Freq: Three times a day (TID) | SUBCUTANEOUS | 3 refills | Status: DC

## 2016-09-28 MED ORDER — ROSUVASTATIN CALCIUM 40 MG PO TABS: 40.0000 mg | ORAL_TABLET | Freq: Every day | ORAL | 1 refills | Status: DC

## 2016-09-28 NOTE — Assessment & Plan Note (Signed)
Tolerating statin, encouraged heart healthy diet, avoid trans fats, minimize simple carbs and saturated fats. Increase exercise as tolerated 

## 2016-09-28 NOTE — Assessment & Plan Note (Signed)
Following with [facility] and now having one treatment monthly

## 2016-09-28 NOTE — Assessment & Plan Note (Signed)
Encouraged feet elevated, weight loss, compression hose and minimize sodium

## 2016-09-28 NOTE — Patient Instructions (Signed)
Increase Humalog to 22 units each meal. Check blood sugars 3 x daily for next 2 weeks. Then send me sugar log so we can decide what to do next  Carbohydrate Counting for Diabetes Mellitus, Adult Carbohydrate counting is a method for keeping track of how many carbohydrates you eat. Eating carbohydrates naturally increases the amount of sugar (glucose) in the blood. Counting how many carbohydrates you eat helps keep your blood glucose within normal limits, which helps you manage your diabetes (diabetes mellitus). It is important to know how many carbohydrates you can safely have in each meal. This is different for every person. A diet and nutrition specialist (registered dietitian) can help you make a meal plan and calculate how many carbohydrates you should have at each meal and snack. Carbohydrates are found in the following foods:  Grains, such as breads and cereals.  Dried beans and soy products.  Starchy vegetables, such as potatoes, peas, and corn.  Fruit and fruit juices.  Milk and yogurt.  Sweets and snack foods, such as cake, cookies, candy, chips, and soft drinks.  How do I count carbohydrates? There are two ways to count carbohydrates in food. You can use either of the methods or a combination of both. Reading "Nutrition Facts" on packaged food The "Nutrition Facts" list is included on the labels of almost all packaged foods and beverages in the [place] It includes:  The serving size.  Information about nutrients in each serving, including the grams (g) of carbohydrate per serving.  To use the "Nutrition Facts":  Decide how many servings you will have.  Multiply the number of servings by the number of carbohydrates per serving.  The resulting number is the total amount of carbohydrates that you will be having.  Learning standard serving sizes of other foods When you eat foods containing carbohydrates that are not packaged or do not include "Nutrition Facts" on the label,  you need to measure the servings in order to count the amount of carbohydrates:  Measure the foods that you will eat with a food scale or measuring cup, if needed.  Decide how many standard-size servings you will eat.  Multiply the number of servings by 15. Most carbohydrate-rich foods have about 15 g of carbohydrates per serving. ? For example, if you eat 8 oz (170 g) of strawberries, you will have eaten 2 servings and 30 g of carbohydrates (2 servings x 15 g = 30 g).  For foods that have more than one food mixed, such as soups and casseroles, you must count the carbohydrates in each food that is included.  The following list contains standard serving sizes of common carbohydrate-rich foods. Each of these servings has about 15 g of carbohydrates:   hamburger bun or  English muffin.   oz (15 mL) syrup.   oz (14 g) jelly.  1 slice of bread.  1 six-inch tortilla.  3 oz (85 g) cooked rice or pasta.  4 oz (113 g) cooked dried beans.  4 oz (113 g) starchy vegetable, such as peas, corn, or potatoes.  4 oz (113 g) hot cereal.  4 oz (113 g) mashed potatoes or  of a large baked potato.  4 oz (113 g) canned or frozen fruit.  4 oz (120 mL) fruit juice.  4-6 crackers.  6 chicken nuggets.  6 oz (170 g) unsweetened dry cereal.  6 oz (170 g) plain fat-free yogurt or yogurt sweetened with artificial sweeteners.  8 oz (240 mL) milk.  8 oz (  170 g) fresh fruit or one small piece of fruit.  24 oz (680 g) popped popcorn.  Example of carbohydrate counting Sample meal  3 oz (85 g) chicken breast.  6 oz (170 g) brown rice.  4 oz (113 g) corn.  8 oz (240 mL) milk.  8 oz (170 g) strawberries with sugar-free whipped topping. Carbohydrate calculation 1. Identify the foods that contain carbohydrates: ? Rice. ? Corn. ? Milk. ? Strawberries. 2. Calculate how many servings you have of each food: ? 2 servings rice. ? 1 serving corn. ? 1 serving milk. ? 1 serving  strawberries. 3. Multiply each number of servings by 15 g: ? 2 servings rice x 15 g = 30 g. ? 1 serving corn x 15 g = 15 g. ? 1 serving milk x 15 g = 15 g. ? 1 serving strawberries x 15 g = 15 g. 4. Add together all of the amounts to find the total grams of carbohydrates eaten: ? 30 g + 15 g + 15 g + 15 g = 75 g of carbohydrates total. This information is not intended to replace advice given to you by your health care provider. Make sure you discuss any questions you have with your health care provider. Document Released: 03/16/2005 Document Revised: 10/04/2015 Document Reviewed: 08/28/2015 Elsevier Interactive Patient Education  [employer].

## 2016-09-28 NOTE — Assessment & Plan Note (Signed)
Well controlled, no changes to meds. Encouraged heart healthy diet such as the DASH diet and exercise as tolerated.  °

## 2016-09-28 NOTE — Assessment & Plan Note (Signed)
On Levothyroxine, continue to monitor. Add an extra dose of Levothyroxine 75 mcg on Mondays.

## 2016-09-28 NOTE — Progress Notes (Signed)
Subjective:  I acted as a Education administrator for Dr. Charlett Blake. [place], [place]  Patient ID: Steven Ibarra, male    DOB: May 17, 1955, 61 y.o.   MRN: [id number]  No chief complaint on file.   HPI  Patient is in today for a follow up. Patient Feels well today. He is recently added compression hose knee-high Ace and notes his pedal edema and his lower extremity discomfort are improved somewhat. He denies any recent febrile illness or acute concerns. Notes his blood sugars continue to run high. He's been checking them in the morning and his lowest number in the last 2 weeks has been 140 and the highest is been to 20 has not been checking in the evening. No recent febrile illness or hospitalization. Denies CP/palp/SOB/HA/congestion/fevers/GI or GU c/o. Taking meds as prescribed  Patient Care Team: Mosie Lukes, MD as PCP - General   Past Medical History:  Diagnosis Date  . ALLERGIC RHINITIS, SEASONAL 10/23/2009  . Anxiety 09/08/2016  . Basal cell carcinoma of neck 05/28/2011   r suprahyoid, radical neck dissection S/p 6 weeks of targeted radiation therapy   . Cancer ([facility])   . CAP (community acquired pneumonia) 03/02/2016  . Costochondritis 09/16/2015  . Diabetes mellitus approx 2003   type 2  . DIABETES MELLITUS, TYPE II 10/23/2009  . DYSPNEA ON EXERTION 10/23/2009  . ELEVATED BLOOD PRESSURE 04/23/2010  . GERD (gastroesophageal reflux disease)   . Hearing loss   . History of radiation therapy 07/06/2011- 08/19/2011   33 Fractions to Right facial nodes through the right neck and right trigeminal nerve  . History of radiation therapy 05/23/14, 05/25/14, 05/28/14, 05/30/14, 06/01/14   SBRT Right upper lullng mass 54 Gy in 3 fractions, Left lower lung mass and adjacent nodules 50 Gy in 5 fractions  . History of radiation therapy 02/11/2015- 02/20/15   Right lung superior upper lobe 50 Gy in 5 fractions, Left Lung posterior lower lobe 50 Gy in 5 fractions  . Hyperlipidemia   . Hypertension    Does not see a  cardiologist, has not had a stress, echo   . Hypothyroid 01/28/2012  . Lung cancer ([facility]) 03/08/14   invasive squamous cell carcinoma  . Mixed hyperlipidemia 10/23/2009  . Obesity   . OSA (obstructive sleep apnea)   . Otitis externa of right ear 07/06/2012  . Overweight(278.02) 10/23/2009  . Pedal edema 09/28/2016  . Preventative health care 09/30/2011  . Pulled muscle 07/06/12  . Right calf pain 09/08/2016  . S/P radiation therapy 07/06/11 - 08/19/11   Right Facial and Right Neck Nodes and right Trigeminal  Nerve to Base of Skull/ Total Dose 6600 cGy/ 33 Fractions  . Skin cancer    on nose, 9-10 yrs ago  . SLEEP APNEA, OBSTRUCTIVE 10/23/2009   Sleep study Done at [facility]  . SOB (shortness of breath) 09/16/2015  . Tinea pedis of both feet 03/02/2016  . Type II or unspecified type diabetes mellitus with unspecified complication, uncontrolled    type 2     Past Surgical History:  Procedure Laterality Date  . LUNG BIOPSY Right 03/08/2014  . RADICAL NECK DISSECTION  05/28/2011   Procedure: RADICAL NECK DISSECTION;  Surgeon: Melissa Montane, MD;  Location: [facility];  Service: ENT;  Laterality: N/A;  Suprahyoid Neck Dissection  . skin cancer removal    . TONSILLECTOMY AND ADENOIDECTOMY      Family History  Problem Relation Age of Onset  . Other Mother  CHF  . Stroke Father   . Hyperlipidemia Father   . Heart disease Father        s/p valve replacement  . Hyperlipidemia Brother   . Obesity Brother   . Other Brother        Back pain  . Hyperlipidemia Brother   . Hypertension Brother   . Other Brother        Panic attacks  . Hyperlipidemia Brother   . Anesthesia problems Neg Hx     Social History   Social History  . Marital status: Married    Spouse name: N/A  . Number of children: N/A  . Years of education: N/A   Occupational History  . Not on file.   Social History Main Topics  . Smoking status: Former Smoker    Packs/day: 1.00    Years: 9.00    Types: Cigarettes    Quit  date: 03/31/1975  . Smokeless tobacco: Never Used  . Alcohol use No     Comment: 4 beers a month, 06/12/11 rarely uses now  . Drug use: No  . Sexual activity: Yes   Other Topics Concern  . Not on file   Social History Narrative   Patient is married.   Patient with a history of smoking one pack per day for approximately 29 years from ages of [age]. Patient denies ever having used smokeless tobacco. Patient with rare use of alcohol.      Mother died at age 26 from congestive heart failure complications. Father died at the age of 60 secondary to a stroke.    Outpatient Medications Prior to Visit  Medication Sig Dispense Refill  . albuterol (VENTOLIN HFA) 108 (90 Base) MCG/ACT inhaler Inhale 2 puffs into the lungs every 6 (six) hours as needed for wheezing or shortness of breath. 3 Inhaler 3  . ALPRAZolam (XANAX) 0.25 MG tablet Take 1 tablet (0.25 mg total) by mouth daily as needed for anxiety. 20 tablet 0  . Ascorbic Acid (VITAMIN C) 1000 MG tablet Take 1,000 mg by mouth daily.    Marland Kitchen BAYER MICROLET LANCETS lancets Use astwice daily to check blood sugar.  DX E11.9 BAYER CONTOUR NEXT EASY PER INSURANCE 100 each 6  . benzonatate (TESSALON) 100 MG capsule TAKE 1 CAPSULE(100 MG) BY MOUTH THREE TIMES DAILY AS NEEDED 40 capsule 0  . Blood Glucose Monitoring Suppl (CONTOUR BLOOD GLUCOSE SYSTEM) DEVI Use twice daily to check blood sugar.  DX E11.9 1 Device 0  . cetirizine (ZYRTEC) 10 MG tablet Take 10 mg by mouth daily as needed for allergies.     . famciclovir (FAMVIR) 500 MG tablet Take 1 tablet (500 mg total) by mouth 3 (three) times daily. 21 tablet 0  . fenofibrate 160 MG tablet TAKE 1 TABLET BY MOUTH EVERY DAY 90 tablet 1  . fluticasone (FLONASE) 50 MCG/ACT nasal spray Place 2 sprays into both nostrils daily. 16 g 6  . glucose blood (BAYER CONTOUR TEST) test strip Use twice daily to check blood sugar.  DX E11.9 BAYER CONTOUR NEXT EASY PER INSURANCE 100 each 6  . HYDROcodone-acetaminophen (NORCO)  5-325 MG tablet Take 1 tablet by mouth every 6 (six) hours as needed for moderate pain. 20 tablet 0  . ibuprofen (ADVIL,MOTRIN) 200 MG tablet Take 600 mg by mouth every 6 (six) hours as needed for pain.     . Insulin Glargine (LANTUS SOLOSTAR) 100 UNIT/ML Solostar Pen INJECT 100 UNITS INTO THE SKIN IN MORNING AND INJECT 70 UNITS  INTO THE SKIN EVERY EVENING. 135 mL 1  . Insulin Pen Needle (B-D ULTRAFINE III SHORT PEN) 31G X 8 MM MISC Use twice daily as directed with lantus 300 each 5  . Krill Oil 300 MG CAPS Take 1 capsule by mouth daily.    Marland Kitchen losartan (COZAAR) 50 MG tablet TAKE 1 TABLET(50 MG) BY MOUTH DAILY 90 tablet 0  . metFORMIN (GLUCOPHAGE) 500 MG tablet TAKE 2 TABLETS BY MOUTH TWICE DAILY 120 tablet 0  . Misc Natural Products (OSTEO BI-FLEX TRIPLE STRENGTH PO) Take 2 tablets by mouth daily.    . mometasone (ASMANEX 60 METERED DOSES) 220 MCG/INH inhaler Inhale 1 puff into the lungs 2 (two) times daily. 1 Inhaler 5  . NON FORMULARY 1 each by Other route 4 (four) times daily. Accu-check multiclix lancets- use as directed    . Probiotic Product (PROBIOTIC DAILY PO) Take by mouth.    . promethazine (PHENERGAN) 12.5 MG tablet Take 1 tablet (12.5 mg total) by mouth every 6 (six) hours as needed for nausea or vomiting. 15 tablet 0  . ranitidine (ZANTAC) 300 MG tablet TAKE 1 TABLET(300 MG) BY MOUTH AT BEDTIME 90 tablet 0  . traMADol (ULTRAM) 50 MG tablet Take 1 tablet (50 mg total) by mouth every 6 (six) hours as needed for severe pain. 20 tablet 0  . insulin lispro (HUMALOG) 100 UNIT/ML injection Inject 0.2 mLs (20 Units total) into the skin 3 (three) times daily before meals. 90 mL 3  . levothyroxine (SYNTHROID, LEVOTHROID) 75 MCG tablet TAKE 1 TABLET BY MOUTH DAILY BEFORE BREAKFAST 90 tablet 1  . rosuvastatin (CRESTOR) 40 MG tablet Take 1 tablet (40 mg total) by mouth daily. 90 tablet 1  . Syringe, Disposable, 3 ML MISC DX: E11.9/E11.65  Use bid as directed 300 each 5   No facility-administered  medications prior to visit.     No Known Allergies  Review of Systems  Constitutional: Positive for malaise/fatigue. Negative for fever.  HENT: Negative for congestion.   Eyes: Negative for blurred vision.  Respiratory: Positive for shortness of breath.   Cardiovascular: Positive for leg swelling. Negative for chest pain and palpitations.  Gastrointestinal: Negative for abdominal pain, blood in stool and nausea.  Genitourinary: Negative for dysuria and frequency.  Musculoskeletal: Negative for falls.  Skin: Negative for rash.  Neurological: Negative for dizziness, loss of consciousness and headaches.  Endo/Heme/Allergies: Negative for environmental allergies.  Psychiatric/Behavioral: Negative for depression. The patient is not nervous/anxious.        Objective:    Physical Exam  Constitutional: He is oriented to person, place, and time. He appears well-developed and well-nourished. No distress.  HENT:  Head: Normocephalic and atraumatic.  Eyes: Conjunctivae are normal.  Neck: Neck supple. No thyromegaly present.  Cardiovascular: Normal rate, regular rhythm and normal heart sounds.   No murmur heard. Pulmonary/Chest: Effort normal and breath sounds normal. No respiratory distress. He has no wheezes.  Abdominal: Soft. Bowel sounds are normal. He exhibits no mass. There is no tenderness.  Musculoskeletal: He exhibits edema.  1-2 + pedal edema on RLE, 1 + pedal edema lle  Lymphadenopathy:    He has no cervical adenopathy.  Neurological: He is alert and oriented to person, place, and time.  Skin: Skin is warm and dry.  1 cm shallow scab above medial malleolus on right, 1.2 cm scabbed lesion lower posterior calf on right, chronic venous stasis changes  Psychiatric: He has a normal mood and affect. His behavior is normal.  BP (!) 142/73 (BP Location: Left Arm, Patient Position: Sitting, Cuff Size: Large)   Pulse 80   Temp 97.8 F (36.6 C) (Oral)   Resp 18   Ht '6\' 6"'  (1.981  m)   Wt (!) 323 lb 6.4 oz (146.7 kg)   SpO2 98%   BMI 37.37 kg/m  Wt Readings from Last 3 Encounters:  09/28/16 (!) 323 lb 6.4 oz (146.7 kg)  09/08/16 (!) 325 lb (147.4 kg)  08/07/16 (!) 327 lb (148.3 kg)   BP Readings from Last 3 Encounters:  09/28/16 (!) 142/73  09/08/16 120/76  08/07/16 130/80     Immunization History  Administered Date(s) Administered  . Influenza Split 01/28/2012  . Influenza Whole 02/11/2010, 01/29/2011  . Influenza,inj,Quad PF,36+ Mos 01/19/2013, 12/28/2013, 02/13/2015, 01/22/2016  . Pneumococcal Conjugate-13 01/19/2013, 03/04/2015  . Pneumococcal Polysaccharide-23 11/19/2015  . Tdap 05/29/2010    Health Maintenance  Topic Date Due  . FOOT EXAM  10/21/1966  . OPHTHALMOLOGY EXAM  09/04/2015  . INFLUENZA VACCINE  10/28/2016  . HEMOGLOBIN A1C  03/24/2017  . TETANUS/TDAP  05/28/2020  . PNEUMOCOCCAL POLYSACCHARIDE VACCINE (2) 11/18/2020  . COLONOSCOPY  03/18/2022  . Hepatitis C Screening  Completed  . HIV Screening  Completed    Lab Results  Component Value Date   WBC 4.6 09/22/2016   HGB 13.8 09/22/2016   HCT 39.8 09/22/2016   PLT 167.0 09/22/2016   GLUCOSE 193 (H) 09/22/2016   CHOL 103 09/22/2016   TRIG 298.0 (H) 09/22/2016   HDL 21.40 (L) 09/22/2016   LDLDIRECT 41.0 09/22/2016   LDLCALC 18 09/08/2013   ALT 21 09/22/2016   AST 16 09/22/2016   NA 140 09/22/2016   K 4.2 09/22/2016   CL 105 09/22/2016   CREATININE 0.95 09/22/2016   BUN 14 09/22/2016   CO2 28 09/22/2016   TSH 4.86 (H) 09/22/2016   PSA 1.46 09/25/2011   INR 0.95 03/08/2014   HGBA1C 8.9 (H) 09/22/2016   MICROALBUR 0.7 09/10/2015    Lab Results  Component Value Date   TSH 4.86 (H) 09/22/2016   Lab Results  Component Value Date   WBC 4.6 09/22/2016   HGB 13.8 09/22/2016   HCT 39.8 09/22/2016   MCV 85.9 09/22/2016   PLT 167.0 09/22/2016   Lab Results  Component Value Date   NA 140 09/22/2016   K 4.2 09/22/2016   CO2 28 09/22/2016   GLUCOSE 193 (H)  09/22/2016   BUN 14 09/22/2016   CREATININE 0.95 09/22/2016   BILITOT 0.5 09/22/2016   ALKPHOS 35 (L) 09/22/2016   AST 16 09/22/2016   ALT 21 09/22/2016   PROT 6.1 09/22/2016   ALBUMIN 3.9 09/22/2016   CALCIUM 9.1 09/22/2016   ANIONGAP 5 11/19/2015   EGFR 70 (L) 01/10/2015   GFR 85.97 09/22/2016   Lab Results  Component Value Date   CHOL 103 09/22/2016   Lab Results  Component Value Date   HDL 21.40 (L) 09/22/2016   Lab Results  Component Value Date   LDLCALC 18 09/08/2013   Lab Results  Component Value Date   TRIG 298.0 (H) 09/22/2016   Lab Results  Component Value Date   CHOLHDL 5 09/22/2016   Lab Results  Component Value Date   HGBA1C 8.9 (H) 09/22/2016         Assessment & Plan:   Problem List Items Addressed This Visit    MIXED HYPERLIPIDEMIA    Tolerating statin, encouraged heart healthy diet, avoid trans fats, minimize simple  carbs and saturated fats. Increase exercise as tolerated      Relevant Medications   rosuvastatin (CRESTOR) 40 MG tablet   Other Relevant Orders   Lipid panel   OSA on CPAP    Uses his CPAP nightly      Hypertension    Well controlled, no changes to meds. Encouraged heart healthy diet such as the DASH diet and exercise as tolerated.       Relevant Medications   rosuvastatin (CRESTOR) 40 MG tablet   Other Relevant Orders   CBC   Comprehensive metabolic panel   TSH   Diabetes mellitus type 2 in obese (HCC)    hgba1c unacceptable, minimize simple carbs. Increase exercise as tolerated. Continue current meds but increaes the Humalog to 22 units tid and have him check his sugar tid and send the log to [place] for further adjusntmens. Eat to small, frequent meals with protein      Relevant Medications   rosuvastatin (CRESTOR) 40 MG tablet   insulin lispro (HUMALOG) 100 UNIT/ML injection   Other Relevant Orders   Hemoglobin A1c   Skin cancer    Following with [facility] and now having one treatment monthly      Hypothyroid     On Levothyroxine, continue to monitor. Add an extra dose of Levothyroxine 75 mcg on Mondays.      Relevant Medications   levothyroxine (SYNTHROID, LEVOTHROID) 75 MCG tablet   Pedal edema    Encouraged feet elevated, weight loss, compression hose and minimize sodium         I have discontinued Mr. Ketcham levothyroxine. I have also changed his levothyroxine and insulin lispro. Additionally, I am having him maintain his NON FORMULARY, cetirizine, ibuprofen, Misc Natural Products (OSTEO BI-FLEX TRIPLE STRENGTH PO), vitamin C, Krill Oil, Probiotic Product (PROBIOTIC DAILY PO), glucose blood, BAYER MICROLET LANCETS, CONTOUR BLOOD GLUCOSE SYSTEM, mometasone, promethazine, Insulin Pen Needle, albuterol, fenofibrate, Insulin Glargine, HYDROcodone-acetaminophen, famciclovir, traMADol, fluticasone, losartan, benzonatate, ranitidine, metFORMIN, ALPRAZolam, Syringe (Disposable), and rosuvastatin.  Meds ordered this encounter  Medications  . DISCONTD: levothyroxine (SYNTHROID, LEVOTHROID) 75 MCG tablet    Sig: TAKE 1 TABLET BY MOUTH DAILY BEFORE BREAKFAST    Dispense:  90 tablet    Refill:  1  . Syringe, Disposable, 3 ML MISC    Sig: DX: E11.9/E11.65  Use bid as directed    Dispense:  300 each    Refill:  5  . rosuvastatin (CRESTOR) 40 MG tablet    Sig: Take 1 tablet (40 mg total) by mouth daily.    Dispense:  90 tablet    Refill:  1  . levothyroxine (SYNTHROID, LEVOTHROID) 75 MCG tablet    Sig: TAKE 1 TABLET BY MOUTH DAILY BEFORE BREAKFAST except on Monday take 2    Dispense:  105 tablet    Refill:  1  . insulin lispro (HUMALOG) 100 UNIT/ML injection    Sig: Inject 0.22 mLs (22 Units total) into the skin 3 (three) times daily with meals.    Dispense:  90 mL    Refill:  3    CMA served as scribe during this visit. History, Physical and Plan performed by medical provider. Documentation and orders reviewed and attested to.  Penni Homans, MD

## 2016-09-28 NOTE — Assessment & Plan Note (Signed)
hgba1c unacceptable, minimize simple carbs. Increase exercise as tolerated. Continue current meds but increaes the Humalog to 22 units tid and have him check his sugar tid and send the log to [place] for further adjusntmens. Eat to small, frequent meals with protein

## 2016-09-28 NOTE — Assessment & Plan Note (Signed)
Uses his CPAP nightly

## 2016-10-10 ENCOUNTER — Other Ambulatory Visit: Payer: Self-pay | Admitting: Family Medicine

## 2016-11-07 ENCOUNTER — Other Ambulatory Visit: Payer: Self-pay | Admitting: Family Medicine

## 2016-11-12 ENCOUNTER — Other Ambulatory Visit: Payer: Self-pay | Admitting: Family Medicine

## 2016-11-16 ENCOUNTER — Telehealth: Payer: Self-pay | Admitting: Family Medicine

## 2016-11-16 NOTE — Telephone Encounter (Signed)
Caller name:Shannon Relation to pt: Call back number:[phone] Pharmacy: [facility]  Reason for call: pharmacy states they sent fax over for a 90 supply for rx lantus, states that the insurance will not pay for it any other way.

## 2016-11-17 ENCOUNTER — Other Ambulatory Visit: Payer: Self-pay | Admitting: Family Medicine

## 2016-11-17 ENCOUNTER — Other Ambulatory Visit: Payer: Self-pay

## 2016-11-17 MED ORDER — INSULIN LISPRO 100 UNIT/ML ~~LOC~~ SOLN: 22.0000 [IU] | Freq: Three times a day (TID) | SUBCUTANEOUS | 3 refills | Status: DC

## 2016-11-17 NOTE — Telephone Encounter (Signed)
Faxed Lantus to [facility] per pt req/thx dmf

## 2016-11-23 ENCOUNTER — Other Ambulatory Visit: Payer: Self-pay | Admitting: Family Medicine

## 2016-11-27 ENCOUNTER — Other Ambulatory Visit: Payer: Self-pay | Admitting: Family Medicine

## 2016-12-01 ENCOUNTER — Other Ambulatory Visit: Payer: Self-pay

## 2016-12-01 MED ORDER — MICROLET LANCETS MISC: 11 refills | Status: DC

## 2016-12-24 ENCOUNTER — Other Ambulatory Visit: Payer: Self-pay | Admitting: Family Medicine

## 2016-12-24 ENCOUNTER — Other Ambulatory Visit (INDEPENDENT_AMBULATORY_CARE_PROVIDER_SITE_OTHER): Payer: BLUE CROSS/BLUE SHIELD

## 2016-12-24 DIAGNOSIS — E669 Obesity, unspecified: Secondary | ICD-10-CM

## 2016-12-24 DIAGNOSIS — I10 Essential (primary) hypertension: Secondary | ICD-10-CM | POA: Diagnosis not present

## 2016-12-24 DIAGNOSIS — E1169 Type 2 diabetes mellitus with other specified complication: Secondary | ICD-10-CM | POA: Diagnosis not present

## 2016-12-24 DIAGNOSIS — E782 Mixed hyperlipidemia: Secondary | ICD-10-CM

## 2016-12-24 LAB — COMPREHENSIVE METABOLIC PANEL
ALK PHOS: 31 U/L — AB (ref 39–117)
ALT: 20 U/L (ref 0–53)
AST: 16 U/L (ref 0–37)
Albumin: 4 g/dL (ref 3.5–5.2)
BILIRUBIN TOTAL: 0.5 mg/dL (ref 0.2–1.2)
BUN: 17 mg/dL (ref 6–23)
CALCIUM: 9.1 mg/dL (ref 8.4–10.5)
CO2: 30 meq/L (ref 19–32)
Chloride: 104 mEq/L (ref 96–112)
Creatinine, Ser: 1.05 mg/dL (ref 0.40–1.50)
GFR: 76.53 mL/min (ref >60.00)
GLUCOSE: 167 mg/dL — AB (ref 70–99)
Potassium: 3.9 mEq/L (ref 3.5–5.1)
Sodium: 140 mEq/L (ref 135–145)
Total Protein: 6.1 g/dL (ref 6.0–8.3)

## 2016-12-24 LAB — CBC
HEMATOCRIT: 41.5 % (ref 39.0–52.0)
HEMOGLOBIN: 14.1 g/dL (ref 13.0–17.0)
MCHC: 33.9 g/dL (ref 30.0–36.0)
MCV: 87.8 fl (ref 78.0–100.0)
Platelets: 182 10*3/uL (ref 150.0–400.0)
RBC: 4.72 Mil/uL (ref 4.22–5.81)
RDW: 14.9 % (ref 11.5–15.5)
WBC: 5.1 10*3/uL (ref 4.0–10.5)

## 2016-12-24 LAB — LDL CHOLESTEROL, DIRECT: Direct LDL: 29 mg/dL

## 2016-12-24 LAB — TSH: TSH: 5.08 u[IU]/mL — ABNORMAL HIGH (ref 0.35–4.50)

## 2016-12-24 LAB — LIPID PANEL
Cholesterol: 98 mg/dL (ref 0–200)
HDL: 20 mg/dL — AB (ref >39.00)
NONHDL: 78.45
TRIGLYCERIDES: 347 mg/dL — AB (ref 0.0–149.0)
Total CHOL/HDL Ratio: 5
VLDL: 69.4 mg/dL — AB (ref 0.0–40.0)

## 2016-12-24 LAB — HEMOGLOBIN A1C: Hgb A1c MFr Bld: 8.4 % — ABNORMAL HIGH (ref 4.6–6.5)

## 2016-12-29 ENCOUNTER — Ambulatory Visit (INDEPENDENT_AMBULATORY_CARE_PROVIDER_SITE_OTHER): Payer: BLUE CROSS/BLUE SHIELD | Admitting: Family Medicine

## 2016-12-29 VITALS — BP 126/78 | HR 89 | Temp 98.4°F | Resp 18 | Wt 323.6 lb

## 2016-12-29 DIAGNOSIS — Z23 Encounter for immunization: Secondary | ICD-10-CM | POA: Diagnosis not present

## 2016-12-29 DIAGNOSIS — Z9989 Dependence on other enabling machines and devices: Secondary | ICD-10-CM

## 2016-12-29 DIAGNOSIS — E782 Mixed hyperlipidemia: Secondary | ICD-10-CM

## 2016-12-29 DIAGNOSIS — C7802 Secondary malignant neoplasm of left lung: Secondary | ICD-10-CM

## 2016-12-29 DIAGNOSIS — E1169 Type 2 diabetes mellitus with other specified complication: Secondary | ICD-10-CM | POA: Diagnosis not present

## 2016-12-29 DIAGNOSIS — I10 Essential (primary) hypertension: Secondary | ICD-10-CM | POA: Diagnosis not present

## 2016-12-29 DIAGNOSIS — F419 Anxiety disorder, unspecified: Secondary | ICD-10-CM

## 2016-12-29 DIAGNOSIS — R0609 Other forms of dyspnea: Secondary | ICD-10-CM

## 2016-12-29 DIAGNOSIS — G4733 Obstructive sleep apnea (adult) (pediatric): Secondary | ICD-10-CM

## 2016-12-29 DIAGNOSIS — E039 Hypothyroidism, unspecified: Secondary | ICD-10-CM | POA: Diagnosis not present

## 2016-12-29 DIAGNOSIS — E669 Obesity, unspecified: Secondary | ICD-10-CM

## 2016-12-29 MED ORDER — LEVOTHYROXINE SODIUM 75 MCG PO TABS: ORAL_TABLET | ORAL | 1 refills | Status: DC

## 2016-12-29 NOTE — Assessment & Plan Note (Signed)
Uses Alprazolam very infrequently, usually only once monthly when he goes for cancer treatments

## 2016-12-29 NOTE — Assessment & Plan Note (Signed)
Uses CPAP nightly 

## 2016-12-29 NOTE — Assessment & Plan Note (Signed)
Made some dietary changes about 10 days ago and is sugars are greatly improved. Last 2 days his sugars have been 125 and 135. No changes to meds today

## 2016-12-29 NOTE — Assessment & Plan Note (Signed)
Well controlled, no changes to meds. Encouraged heart healthy diet such as the DASH diet and exercise as tolerated.  °

## 2016-12-29 NOTE — Assessment & Plan Note (Signed)
Worse the couple days after his cancer treatment each month

## 2016-12-29 NOTE — Progress Notes (Signed)
Subjective:  I acted as a Education administrator for Dr. Charlett Blake. [place], [place]  Patient ID: Steven Ibarra, male    DOB: January 21, 1956, 61 y.o.   MRN: [id number]  No chief complaint on file.   HPI  Patient is in today for a 3 month follow up. Patient is following up on his HTN, DM TSH and other medical concerns. Patient has no acute concerns today. No recent febrile illness or acute hospitalizations. Denies CP/palp/SOB/HA/congestion/fevers/GI or GU c/o. Taking meds as prescribe. He notes 4-5 days of excessive fatigue after each monthly cancer treatment but the rest of the month he feels well.    Patient Care Team: Mosie Lukes, MD as PCP - General   Past Medical History:  Diagnosis Date  . ALLERGIC RHINITIS, SEASONAL 10/23/2009  . Anxiety 09/08/2016  . Basal cell carcinoma of neck 05/28/2011   r suprahyoid, radical neck dissection S/p 6 weeks of targeted radiation therapy   . Cancer ([place])   . CAP (community acquired pneumonia) 03/02/2016  . Costochondritis 09/16/2015  . Diabetes mellitus approx 2003   type 2  . DIABETES MELLITUS, TYPE II 10/23/2009  . DYSPNEA ON EXERTION 10/23/2009  . ELEVATED BLOOD PRESSURE 04/23/2010  . GERD (gastroesophageal reflux disease)   . Hearing loss   . History of radiation therapy 07/06/2011- 08/19/2011   33 Fractions to Right facial nodes through the right neck and right trigeminal nerve  . History of radiation therapy 05/23/14, 05/25/14, 05/28/14, 05/30/14, 06/01/14   SBRT Right upper lullng mass 54 Gy in 3 fractions, Left lower lung mass and adjacent nodules 50 Gy in 5 fractions  . History of radiation therapy 02/11/2015- 02/20/15   Right lung superior upper lobe 50 Gy in 5 fractions, Left Lung posterior lower lobe 50 Gy in 5 fractions  . Hyperlipidemia   . Hypertension    Does not see a cardiologist, has not had a stress, echo   . Hypothyroid 01/28/2012  . Lung cancer ([place]) 03/08/14   invasive squamous cell carcinoma  . Mixed hyperlipidemia 10/23/2009  . Obesity   . OSA  (obstructive sleep apnea)   . Otitis externa of right ear 07/06/2012  . Overweight(278.02) 10/23/2009  . Pedal edema 09/28/2016  . Preventative health care 09/30/2011  . Pulled muscle 07/06/12  . Right calf pain 09/08/2016  . S/P radiation therapy 07/06/11 - 08/19/11   Right Facial and Right Neck Nodes and right Trigeminal  Nerve to Base of Skull/ Total Dose 6600 cGy/ 33 Fractions  . Skin cancer    on nose, 9-10 yrs ago  . SLEEP APNEA, OBSTRUCTIVE 10/23/2009   Sleep study Done at [facility]  . SOB (shortness of breath) 09/16/2015  . Tinea pedis of both feet 03/02/2016  . Type II or unspecified type diabetes mellitus with unspecified complication, uncontrolled    type 2     Past Surgical History:  Procedure Laterality Date  . LUNG BIOPSY Right 03/08/2014  . RADICAL NECK DISSECTION  05/28/2011   Procedure: RADICAL NECK DISSECTION;  Surgeon: Melissa Montane, MD;  Location: [facility];  Service: ENT;  Laterality: N/A;  Suprahyoid Neck Dissection  . skin cancer removal    . TONSILLECTOMY AND ADENOIDECTOMY      Family History  Problem Relation Age of Onset  . Other Mother        CHF  . Stroke Father   . Hyperlipidemia Father   . Heart disease Father        s/p valve replacement  .  Hyperlipidemia Brother   . Obesity Brother   . Other Brother        Back pain  . Hyperlipidemia Brother   . Hypertension Brother   . Other Brother        Panic attacks  . Hyperlipidemia Brother   . Anesthesia problems Neg Hx     Social History   Social History  . Marital status: Married    Spouse name: N/A  . Number of children: N/A  . Years of education: N/A   Occupational History  . Not on file.   Social History Main Topics  . Smoking status: Former Smoker    Packs/day: 1.00    Years: 9.00    Types: Cigarettes    Quit date: 03/31/1975  . Smokeless tobacco: Never Used  . Alcohol use No     Comment: 4 beers a month, 06/12/11 rarely uses now  . Drug use: No  . Sexual activity: Yes   Other Topics Concern  .  Not on file   Social History Narrative   Patient is married.   Patient with a history of smoking one pack per day for approximately 29 years from ages of [age]. Patient denies ever having used smokeless tobacco. Patient with rare use of alcohol.      Mother died at age 52 from congestive heart failure complications. Father died at the age of 41 secondary to a stroke.    Outpatient Medications Prior to Visit  Medication Sig Dispense Refill  . albuterol (VENTOLIN HFA) 108 (90 Base) MCG/ACT inhaler Inhale 2 puffs into the lungs every 6 (six) hours as needed for wheezing or shortness of breath. 3 Inhaler 3  . ALPRAZolam (XANAX) 0.25 MG tablet Take 1 tablet (0.25 mg total) by mouth daily as needed for anxiety. 20 tablet 0  . Ascorbic Acid (VITAMIN C) 1000 MG tablet Take 1,000 mg by mouth daily.    . benzonatate (TESSALON) 100 MG capsule TAKE 1 CAPSULE(100 MG) BY MOUTH THREE TIMES DAILY AS NEEDED 40 capsule 0  . Blood Glucose Monitoring Suppl (CONTOUR BLOOD GLUCOSE SYSTEM) DEVI Use twice daily to check blood sugar.  DX E11.9 1 Device 0  . cetirizine (ZYRTEC) 10 MG tablet Take 10 mg by mouth daily as needed for allergies.     . CONTOUR NEXT TEST test strip USE AS DIRECTED TWICE DAILY 100 each 11  . famciclovir (FAMVIR) 500 MG tablet Take 1 tablet (500 mg total) by mouth 3 (three) times daily. 21 tablet 0  . fenofibrate 160 MG tablet TAKE 1 TABLET BY MOUTH EVERY DAY 90 tablet 0  . fluticasone (FLONASE) 50 MCG/ACT nasal spray Place 2 sprays into both nostrils daily. 16 g 6  . glucose blood (BAYER CONTOUR TEST) test strip Use twice daily to check blood sugar.  DX E11.9 BAYER CONTOUR NEXT EASY PER INSURANCE 100 each 6  . HYDROcodone-acetaminophen (NORCO) 5-325 MG tablet Take 1 tablet by mouth every 6 (six) hours as needed for moderate pain. 20 tablet 0  . ibuprofen (ADVIL,MOTRIN) 200 MG tablet Take 600 mg by mouth every 6 (six) hours as needed for pain.     Marland Kitchen insulin lispro (HUMALOG) 100 UNIT/ML  injection Inject 0.22 mLs (22 Units total) into the skin 3 (three) times daily with meals. 90 mL 3  . Insulin Pen Needle (B-D ULTRAFINE III SHORT PEN) 31G X 8 MM MISC Use twice daily as directed with lantus 300 each 5  . Krill Oil 300 MG CAPS Take 1  capsule by mouth daily.    Marland Kitchen LANTUS SOLOSTAR 100 UNIT/ML Solostar Pen INJECT 100 UNITS INTO THE SKIN EVERY MORNING AND 70 UNITS INTO THE SKIN EVERY EVENING. 153 mL 0  . losartan (COZAAR) 50 MG tablet TAKE 1 TABLET(50 MG) BY MOUTH DAILY 90 tablet 0  . metFORMIN (GLUCOPHAGE) 500 MG tablet TAKE 2 TABLETS BY MOUTH TWICE DAILY 360 tablet 0  . MICROLET LANCETS MISC Test twice daily as directed with microlet colored lancets Dx: DM2 obese 100 each 11  . Misc Natural Products (OSTEO BI-FLEX TRIPLE STRENGTH PO) Take 2 tablets by mouth daily.    . mometasone (ASMANEX 60 METERED DOSES) 220 MCG/INH inhaler Inhale 1 puff into the lungs 2 (two) times daily. 1 Inhaler 5  . NON FORMULARY 1 each by Other route 4 (four) times daily. Accu-check multiclix lancets- use as directed    . Probiotic Product (PROBIOTIC DAILY PO) Take by mouth.    . promethazine (PHENERGAN) 12.5 MG tablet Take 1 tablet (12.5 mg total) by mouth every 6 (six) hours as needed for nausea or vomiting. 15 tablet 0  . ranitidine (ZANTAC) 300 MG tablet Take 1 tablet (300 mg total) by mouth at bedtime. 90 tablet 0  . rosuvastatin (CRESTOR) 40 MG tablet Take 1 tablet (40 mg total) by mouth daily. 90 tablet 1  . Syringe, Disposable, 3 ML MISC DX: E11.9/E11.65  Use bid as directed 300 each 5  . traMADol (ULTRAM) 50 MG tablet Take 1 tablet (50 mg total) by mouth every 6 (six) hours as needed for severe pain. 20 tablet 0  . levothyroxine (SYNTHROID, LEVOTHROID) 75 MCG tablet TAKE 1 TABLET BY MOUTH DAILY BEFORE BREAKFAST except on Monday take 2 105 tablet 1   No facility-administered medications prior to visit.     No Known Allergies  Review of Systems  Constitutional: Positive for malaise/fatigue.  Negative for fever.  HENT: Negative for congestion.   Eyes: Negative for blurred vision.  Respiratory: Negative for cough and shortness of breath.   Cardiovascular: Negative for chest pain, palpitations and leg swelling.  Gastrointestinal: Negative for vomiting.  Musculoskeletal: Negative for back pain.  Skin: Negative for rash.  Neurological: Negative for loss of consciousness and headaches.       Objective:    Physical Exam  Constitutional: He is oriented to person, place, and time. He appears well-developed and well-nourished. No distress.  HENT:  Head: Normocephalic and atraumatic.  Eyes: Conjunctivae are normal.  Neck: Normal range of motion. No thyromegaly present.  Cardiovascular: Normal rate and regular rhythm.   Pulmonary/Chest: Effort normal and breath sounds normal. He has no wheezes.  Abdominal: Soft. Bowel sounds are normal. There is no tenderness.  Musculoskeletal: Normal range of motion. He exhibits no edema or deformity.  Neurological: He is alert and oriented to person, place, and time.  Skin: Skin is warm and dry. He is not diaphoretic.  Psychiatric: He has a normal mood and affect.    BP 126/78 (BP Location: Left Arm, Patient Position: Sitting, Cuff Size: Normal)   Pulse 89   Temp 98.4 F (36.9 C) (Oral)   Resp 18   Wt (!) 323 lb 9.6 oz (146.8 kg)   SpO2 97%   BMI 37.40 kg/m  Wt Readings from Last 3 Encounters:  12/29/16 (!) 323 lb 9.6 oz (146.8 kg)  09/28/16 (!) 323 lb 6.4 oz (146.7 kg)  09/08/16 (!) 325 lb (147.4 kg)   BP Readings from Last 3 Encounters:  12/29/16 126/78  09/28/16 (!) 142/73  09/08/16 120/76     Immunization History  Administered Date(s) Administered  . Influenza Split 01/28/2012  . Influenza Whole 02/11/2010, 01/29/2011  . Influenza,inj,Quad PF,6+ Mos 01/19/2013, 12/28/2013, 02/13/2015, 01/22/2016, 12/29/2016  . Pneumococcal Conjugate-13 01/19/2013, 03/04/2015  . Pneumococcal Polysaccharide-23 11/19/2015  . Tdap  05/29/2010    Health Maintenance  Topic Date Due  . FOOT EXAM  10/21/1966  . OPHTHALMOLOGY EXAM  09/04/2015  . HEMOGLOBIN A1C  06/23/2017  . TETANUS/TDAP  05/28/2020  . PNEUMOCOCCAL POLYSACCHARIDE VACCINE (2) 11/18/2020  . COLONOSCOPY  03/18/2022  . INFLUENZA VACCINE  Completed  . Hepatitis C Screening  Completed  . HIV Screening  Completed    Lab Results  Component Value Date   WBC 5.1 12/24/2016   HGB 14.1 12/24/2016   HCT 41.5 12/24/2016   PLT 182.0 12/24/2016   GLUCOSE 167 (H) 12/24/2016   CHOL 98 12/24/2016   TRIG 347.0 (H) 12/24/2016   HDL 20.00 (L) 12/24/2016   LDLDIRECT 29.0 12/24/2016   LDLCALC 18 09/08/2013   ALT 20 12/24/2016   AST 16 12/24/2016   NA 140 12/24/2016   K 3.9 12/24/2016   CL 104 12/24/2016   CREATININE 1.05 12/24/2016   BUN 17 12/24/2016   CO2 30 12/24/2016   TSH 5.08 (H) 12/24/2016   PSA 1.46 09/25/2011   INR 0.95 03/08/2014   HGBA1C 8.4 (H) 12/24/2016   MICROALBUR 0.7 09/10/2015    Lab Results  Component Value Date   TSH 5.08 (H) 12/24/2016   Lab Results  Component Value Date   WBC 5.1 12/24/2016   HGB 14.1 12/24/2016   HCT 41.5 12/24/2016   MCV 87.8 12/24/2016   PLT 182.0 12/24/2016   Lab Results  Component Value Date   NA 140 12/24/2016   K 3.9 12/24/2016   CO2 30 12/24/2016   GLUCOSE 167 (H) 12/24/2016   BUN 17 12/24/2016   CREATININE 1.05 12/24/2016   BILITOT 0.5 12/24/2016   ALKPHOS 31 (L) 12/24/2016   AST 16 12/24/2016   ALT 20 12/24/2016   PROT 6.1 12/24/2016   ALBUMIN 4.0 12/24/2016   CALCIUM 9.1 12/24/2016   ANIONGAP 5 11/19/2015   EGFR 70 (L) 01/10/2015   GFR 76.53 12/24/2016   Lab Results  Component Value Date   CHOL 98 12/24/2016   Lab Results  Component Value Date   HDL 20.00 (L) 12/24/2016   Lab Results  Component Value Date   LDLCALC 18 09/08/2013   Lab Results  Component Value Date   TRIG 347.0 (H) 12/24/2016   Lab Results  Component Value Date   CHOLHDL 5 12/24/2016   Lab Results    Component Value Date   HGBA1C 8.4 (H) 12/24/2016         Assessment & Plan:   Problem List Items Addressed This Visit    MIXED HYPERLIPIDEMIA    Encouraged heart healthy diet, increase exercise, avoid trans fats, consider a krill oil cap daily      Relevant Orders   Lipid panel   OSA on CPAP    Uses CPAP nightly      DOE (dyspnea on exertion)    Worse the couple days after his cancer treatment each month      Hypertension    Well controlled, no changes to meds. Encouraged heart healthy diet such as the DASH diet and exercise as tolerated.       Relevant Orders   CBC   Comprehensive metabolic panel   TSH  Diabetes mellitus type 2 in obese (HCC)    Made some dietary changes about 10 days ago and is sugars are greatly improved. Last 2 days his sugars have been 125 and 135. No changes to meds today      Relevant Orders   Hemoglobin A1c   Hypothyroid    On Levothyroxine, continue to monitor      Relevant Medications   levothyroxine (SYNTHROID, LEVOTHROID) 75 MCG tablet   Other Relevant Orders   TSH   Lung metastases (HCC)    Continues to struggle with monthly cancer treatments at [facility]. He has 4-5 days of severe fatigue after each treatment but otherwise he is doing well.       Anxiety    Uses Alprazolam very infrequently, usually only once monthly when he goes for cancer treatments       Other Visit Diagnoses    Needs flu shot    -  Primary   Relevant Orders   Flu Vaccine QUAD 6+ mos PF IM (Fluarix Quad PF) (Completed)      I have discontinued Mr. Teale levothyroxine. I have also changed his levothyroxine. Additionally, I am having him maintain his NON FORMULARY, cetirizine, ibuprofen, Misc Natural Products (OSTEO BI-FLEX TRIPLE STRENGTH PO), vitamin C, Krill Oil, Probiotic Product (PROBIOTIC DAILY PO), glucose blood, CONTOUR BLOOD GLUCOSE SYSTEM, mometasone, promethazine, Insulin Pen Needle, albuterol, HYDROcodone-acetaminophen, famciclovir, traMADol,  fluticasone, benzonatate, ALPRAZolam, Syringe (Disposable), rosuvastatin, metFORMIN, fenofibrate, insulin lispro, LANTUS SOLOSTAR, ranitidine, CONTOUR NEXT TEST, MICROLET LANCETS, and losartan.  Meds ordered this encounter  Medications  . DISCONTD: levothyroxine (SYNTHROID, LEVOTHROID) 75 MCG tablet    Sig: TAKE 1 TABLET BY MOUTH DAILY BEFORE BREAKFAST except on Monday take 2    Dispense:  105 tablet    Refill:  1  . levothyroxine (SYNTHROID, LEVOTHROID) 75 MCG tablet    Sig: TAKE 1 TABLET BY MOUTH DAILY BEFORE BREAKFAST except on Tuesday and Saturday take an extra 1/2 of a tab those days    Dispense:  105 tablet    Refill:  1    CMA served as scribe during this visit. History, Physical and Plan performed by medical provider. Documentation and orders reviewed and attested to.  Penni Homans, MD

## 2016-12-29 NOTE — Assessment & Plan Note (Signed)
Encouraged heart healthy diet, increase exercise, avoid trans fats, consider a krill oil cap daily 

## 2016-12-29 NOTE — Patient Instructions (Signed)
Shingrix is the new shingles shot, it is the shingles shot it is 2 shots over 2-6 months, check with insurance and confirm coverage, then can get at pharmacy or call here at intervals to see if we have it and we can give. Hypertension Hypertension, commonly called high blood pressure, is when the force of blood pumping through the arteries is too strong. The arteries are the blood vessels that carry blood from the heart throughout the body. Hypertension forces the heart to work harder to pump blood and may cause arteries to become narrow or stiff. Having untreated or uncontrolled hypertension can cause heart attacks, strokes, kidney disease, and other problems. A blood pressure reading consists of a higher number over a lower number. Ideally, your blood pressure should be below 120/80. The first ("top") number is called the systolic pressure. It is a measure of the pressure in your arteries as your heart beats. The second ("bottom") number is called the diastolic pressure. It is a measure of the pressure in your arteries as the heart relaxes. What are the causes? The cause of this condition is not known. What increases the risk? Some risk factors for high blood pressure are under your control. Others are not. Factors you can change  Smoking.  Having type 2 diabetes mellitus, high cholesterol, or both.  Not getting enough exercise or physical activity.  Being overweight.  Having too much fat, sugar, calories, or salt (sodium) in your diet.  Drinking too much alcohol. Factors that are difficult or impossible to change  Having chronic kidney disease.  Having a family history of high blood pressure.  Age. Risk increases with age.  Race. You may be at higher risk if you are African-American.  Gender. Men are at higher risk than women before age 3. After age 2, women are at higher risk than men.  Having obstructive sleep apnea.  Stress. What are the signs or symptoms? Extremely high  blood pressure (hypertensive crisis) may cause:  Headache.  Anxiety.  Shortness of breath.  Nosebleed.  Nausea and vomiting.  Severe chest pain.  Jerky movements you cannot control (seizures).  How is this diagnosed? This condition is diagnosed by measuring your blood pressure while you are seated, with your arm resting on a surface. The cuff of the blood pressure monitor will be placed directly against the skin of your upper arm at the level of your heart. It should be measured at least twice using the same arm. Certain conditions can cause a difference in blood pressure between your right and left arms. Certain factors can cause blood pressure readings to be lower or higher than normal (elevated) for a short period of time:  When your blood pressure is higher when you are in a health care provider's office than when you are at home, this is called white coat hypertension. Most people with this condition do not need medicines.  When your blood pressure is higher at home than when you are in a health care provider's office, this is called masked hypertension. Most people with this condition may need medicines to control blood pressure.  If you have a high blood pressure reading during one visit or you have normal blood pressure with other risk factors:  You may be asked to return on a different day to have your blood pressure checked again.  You may be asked to monitor your blood pressure at home for 1 week or longer.  If you are diagnosed with hypertension, you may have  other blood or imaging tests to help your health care provider understand your overall risk for other conditions. How is this treated? This condition is treated by making healthy lifestyle changes, such as eating healthy foods, exercising more, and reducing your alcohol intake. Your health care provider may prescribe medicine if lifestyle changes are not enough to get your blood pressure under control, and if:  Your  systolic blood pressure is above 130.  Your diastolic blood pressure is above 80.  Your personal target blood pressure may vary depending on your medical conditions, your age, and other factors. Follow these instructions at home: Eating and drinking  Eat a diet that is high in fiber and potassium, and low in sodium, added sugar, and fat. An example eating plan is called the DASH (Dietary Approaches to Stop Hypertension) diet. To eat this way: ? Eat plenty of fresh fruits and vegetables. Try to fill half of your plate at each meal with fruits and vegetables. ? Eat whole grains, such as whole wheat pasta, brown rice, or whole grain bread. Fill about one quarter of your plate with whole grains. ? Eat or drink low-fat dairy products, such as skim milk or low-fat yogurt. ? Avoid fatty cuts of meat, processed or cured meats, and poultry with skin. Fill about one quarter of your plate with lean proteins, such as fish, chicken without skin, beans, eggs, and tofu. ? Avoid premade and processed foods. These tend to be higher in sodium, added sugar, and fat.  Reduce your daily sodium intake. Most people with hypertension should eat less than 1,500 mg of sodium a day.  Limit alcohol intake to no more than 1 drink a day for nonpregnant women and 2 drinks a day for men. One drink equals 12 oz of beer, 5 oz of wine, or 1 oz of hard liquor. Lifestyle  Work with your health care provider to maintain a healthy body weight or to lose weight. Ask what an ideal weight is for you.  Get at least 30 minutes of exercise that causes your heart to beat faster (aerobic exercise) most days of the week. Activities may include walking, swimming, or biking.  Include exercise to strengthen your muscles (resistance exercise), such as pilates or lifting weights, as part of your weekly exercise routine. Try to do these types of exercises for 30 minutes at least 3 days a week.  Do not use any products that contain nicotine or  tobacco, such as cigarettes and e-cigarettes. If you need help quitting, ask your health care provider.  Monitor your blood pressure at home as told by your health care provider.  Keep all follow-up visits as told by your health care provider. This is important. Medicines  Take over-the-counter and prescription medicines only as told by your health care provider. Follow directions carefully. Blood pressure medicines must be taken as prescribed.  Do not skip doses of blood pressure medicine. Doing this puts you at risk for problems and can make the medicine less effective.  Ask your health care provider about side effects or reactions to medicines that you should watch for. Contact a health care provider if:  You think you are having a reaction to a medicine you are taking.  You have headaches that keep coming back (recurring).  You feel dizzy.  You have swelling in your ankles.  You have trouble with your vision. Get help right away if:  You develop a severe headache or confusion.  You have unusual weakness or numbness.  You feel faint.  You have severe pain in your chest or abdomen.  You vomit repeatedly.  You have trouble breathing. Summary  Hypertension is when the force of blood pumping through your arteries is too strong. If this condition is not controlled, it may put you at risk for serious complications.  Your personal target blood pressure may vary depending on your medical conditions, your age, and other factors. For most people, a normal blood pressure is less than 120/80.  Hypertension is treated with lifestyle changes, medicines, or a combination of both. Lifestyle changes include weight loss, eating a healthy, low-sodium diet, exercising more, and limiting alcohol. This information is not intended to replace advice given to you by your health care provider. Make sure you discuss any questions you have with your health care provider. Document Released:  03/16/2005 Document Revised: 02/12/2016 Document Reviewed: 02/12/2016 Elsevier Interactive Patient Education  [employer].

## 2016-12-29 NOTE — Assessment & Plan Note (Signed)
On Levothyroxine, continue to monitor 

## 2017-01-03 NOTE — Assessment & Plan Note (Signed)
Continues to struggle with monthly cancer treatments at [facility]. He has 4-5 days of severe fatigue after each treatment but otherwise he is doing well.

## 2017-01-21 ENCOUNTER — Other Ambulatory Visit: Payer: Self-pay | Admitting: Family Medicine

## 2017-02-02 ENCOUNTER — Other Ambulatory Visit: Payer: Self-pay | Admitting: Family Medicine

## 2017-02-02 NOTE — Telephone Encounter (Signed)
Requesting:xanax Contract:no UDS:no Last OV:12/29/16 Next OV:04/13/16 Last Refill:09/08/16 #[id number]   Please advise

## 2017-02-04 NOTE — Telephone Encounter (Signed)
rx faxed

## 2017-02-05 ENCOUNTER — Other Ambulatory Visit: Payer: Self-pay | Admitting: Family Medicine

## 2017-02-11 ENCOUNTER — Other Ambulatory Visit: Payer: Self-pay | Admitting: Family Medicine

## 2017-02-11 NOTE — Telephone Encounter (Signed)
Call to request Rx refill lantus 10 Metformin 500  pt is completely out and usually gets a 90 day supply.  Please advise CB [phone]

## 2017-02-11 NOTE — Telephone Encounter (Signed)
°  Relation to EV:OJJK Call back number:[phone] Pharmacy: [place], [place]. Ruthe Mannan [phone] (Phone) [phone] (Fax)     Reason for call:  Patient checking on the status of medication refill, did inform patient of medication request policy, patient states he's completely out, please advise

## 2017-02-12 ENCOUNTER — Other Ambulatory Visit: Payer: Self-pay

## 2017-02-15 ENCOUNTER — Other Ambulatory Visit: Payer: Self-pay

## 2017-02-15 MED ORDER — LOSARTAN POTASSIUM 50 MG PO TABS: ORAL_TABLET | ORAL | 0 refills | Status: DC

## 2017-02-15 NOTE — Telephone Encounter (Signed)
Medications sent to pharmacy

## 2017-03-12 ENCOUNTER — Encounter: Payer: Self-pay | Admitting: Internal Medicine

## 2017-03-12 ENCOUNTER — Ambulatory Visit: Payer: BLUE CROSS/BLUE SHIELD | Admitting: Internal Medicine

## 2017-03-12 VITALS — BP 128/78 | HR 85 | Temp 98.2°F | Resp 14 | Ht 78.0 in | Wt 333.1 lb

## 2017-03-12 DIAGNOSIS — J069 Acute upper respiratory infection, unspecified: Secondary | ICD-10-CM

## 2017-03-12 MED ORDER — AMOXICILLIN 875 MG PO TABS: 875.0000 mg | ORAL_TABLET | Freq: Two times a day (BID) | ORAL | 0 refills | Status: DC

## 2017-03-12 NOTE — Progress Notes (Signed)
Pre visit review using our clinic review tool, if applicable. No additional management support is needed unless otherwise documented below in the visit note. 

## 2017-03-12 NOTE — Patient Instructions (Signed)
Rest, fluids , tylenol  For cough:  Take Mucinex DM twice a day as needed until better  For nasal congestion: Use OTC Flonase : 2 nasal sprays on each side of the nose in the morning until you feel better   Avoid decongestants such as  Pseudoephedrine or phenylephrine    Take the antibiotic as prescribed  (Amoxicillin) if not improving in 2-3 days   Call if not gradually better over the next  10 days  Call anytime if the symptoms are severe, you have high fever, short of breath, chest pain

## 2017-03-12 NOTE — Progress Notes (Signed)
Subjective:    Patient ID: Steven Ibarra, male    DOB: 04-Jan-1956, 61 y.o.   MRN: [id number]  DOS:  03/12/2017 Type of visit - description : Acute visit Interval history: Symptoms started 3 days ago with mild sore throat, hoarseness, some chest congestion and cough.  Very little sputum, clear in color. Taking OTC medicines, some help, the "daytime Mucinex" caused  palpitations  Review of Systems Denies fever chills No nausea or vomiting.  No unusual aches or pains Shortness of breath, not new, at baseline. No wheezing.  Past Medical History:  Diagnosis Date  . ALLERGIC RHINITIS, SEASONAL 10/23/2009  . Anxiety 09/08/2016  . Basal cell carcinoma of neck 05/28/2011   r suprahyoid, radical neck dissection S/p 6 weeks of targeted radiation therapy   . Cancer ([place])   . CAP (community acquired pneumonia) 03/02/2016  . Costochondritis 09/16/2015  . Diabetes mellitus approx 2003   type 2  . DIABETES MELLITUS, TYPE II 10/23/2009  . DYSPNEA ON EXERTION 10/23/2009  . ELEVATED BLOOD PRESSURE 04/23/2010  . GERD (gastroesophageal reflux disease)   . Hearing loss   . History of radiation therapy 07/06/2011- 08/19/2011   33 Fractions to Right facial nodes through the right neck and right trigeminal nerve  . History of radiation therapy 05/23/14, 05/25/14, 05/28/14, 05/30/14, 06/01/14   SBRT Right upper lullng mass 54 Gy in 3 fractions, Left lower lung mass and adjacent nodules 50 Gy in 5 fractions  . History of radiation therapy 02/11/2015- 02/20/15   Right lung superior upper lobe 50 Gy in 5 fractions, Left Lung posterior lower lobe 50 Gy in 5 fractions  . Hyperlipidemia   . Hypertension    Does not see a cardiologist, has not had a stress, echo   . Hypothyroid 01/28/2012  . Lung cancer ([place]) 03/08/14   invasive squamous cell carcinoma  . Mixed hyperlipidemia 10/23/2009  . Obesity   . OSA (obstructive sleep apnea)   . Otitis externa of right ear 07/06/2012  . Overweight(278.02) 10/23/2009  . Pedal  edema 09/28/2016  . Preventative health care 09/30/2011  . Pulled muscle 07/06/12  . Right calf pain 09/08/2016  . S/P radiation therapy 07/06/11 - 08/19/11   Right Facial and Right Neck Nodes and right Trigeminal  Nerve to Base of Skull/ Total Dose 6600 cGy/ 33 Fractions  . Skin cancer    on nose, 9-10 yrs ago  . SLEEP APNEA, OBSTRUCTIVE 10/23/2009   Sleep study [facility]  . SOB (shortness of breath) 09/16/2015  . Tinea pedis of both feet 03/02/2016  . Type II or unspecified type diabetes mellitus with unspecified complication, uncontrolled    type 2     Past Surgical History:  Procedure Laterality Date  . LUNG BIOPSY Right 03/08/2014  . RADICAL NECK DISSECTION  05/28/2011   Procedure: RADICAL NECK DISSECTION;  Surgeon: Melissa Montane, MD;  Location: [place];  Service: ENT;  Laterality: N/A;  Suprahyoid Neck Dissection  . skin cancer removal    . TONSILLECTOMY AND ADENOIDECTOMY      Social History   Socioeconomic History  . Marital status: Married    Spouse name: Not on file  . Number of children: Not on file  . Years of education: Not on file  . Highest education level: Not on file  Social Needs  . Financial resource strain: Not on file  . Food insecurity - worry: Not on file  . Food insecurity - inability: Not on file  .  Transportation needs - medical: Not on file  . Transportation needs - non-medical: Not on file  Occupational History  . Not on file  Tobacco Use  . Smoking status: Former Smoker    Packs/day: 1.00    Years: 9.00    Pack years: 9.00    Types: Cigarettes    Last attempt to quit: 03/31/1975    Years since quitting: 41.9  . Smokeless tobacco: Never Used  Substance and Sexual Activity  . Alcohol use: No    Alcohol/week: 0.0 oz    Comment: 4 beers a month, 06/12/11 rarely uses now  . Drug use: No  . Sexual activity: Yes  Other Topics Concern  . Not on file  Social History Narrative   Patient is married.   Patient with a history of smoking one pack per day  for approximately 29 years from ages of [age]. Patient denies ever having used smokeless tobacco. Patient with rare use of alcohol.      Mother died at age 95 from congestive heart failure complications. Father died at the age of 60 secondary to a stroke.      Allergies as of 03/12/2017   No Known Allergies     Medication List        Accurate as of 03/12/17  5:20 PM. Always use your most recent med list.          albuterol 108 (90 Base) MCG/ACT inhaler Commonly known as:  VENTOLIN HFA Inhale 2 puffs into the lungs every 6 (six) hours as needed for wheezing or shortness of breath.   ALPRAZolam 0.25 MG tablet Commonly known as:  XANAX TAKE 1 TABLET BY MOUTH DAILY AS NEEDED FOR ANXIETY   amoxicillin 875 MG tablet Commonly known as:  AMOXIL Take 1 tablet (875 mg total) by mouth 2 (two) times daily.   benzonatate 100 MG capsule Commonly known as:  TESSALON TAKE 1 CAPSULE(100 MG) BY MOUTH THREE TIMES DAILY AS NEEDED   cetirizine 10 MG tablet Commonly known as:  ZYRTEC Take 10 mg by mouth daily as needed for allergies.   CONTOUR BLOOD GLUCOSE SYSTEM Devi Use twice daily to check blood sugar.  DX E11.9   famciclovir 500 MG tablet Commonly known as:  FAMVIR Take 1 tablet (500 mg total) by mouth 3 (three) times daily.   fenofibrate 160 MG tablet TAKE 1 TABLET BY MOUTH EVERY DAY   fluticasone 50 MCG/ACT nasal spray Commonly known as:  FLONASE Place 2 sprays into both nostrils daily.   glucose blood test strip Commonly known as:  BAYER CONTOUR TEST Use twice daily to check blood sugar.  DX E11.9 BAYER CONTOUR NEXT EASY PER INSURANCE   CONTOUR NEXT TEST test strip Generic drug:  glucose blood USE AS DIRECTED TWICE DAILY   HYDROcodone-acetaminophen 5-325 MG tablet Commonly known as:  NORCO Take 1 tablet by mouth every 6 (six) hours as needed for moderate pain.   ibuprofen 200 MG tablet Commonly known as:  ADVIL,MOTRIN Take 600 mg by mouth every 6 (six) hours as  needed for pain.   insulin lispro 100 UNIT/ML injection Commonly known as:  HUMALOG Inject 0.22 mLs (22 Units total) into the skin 3 (three) times daily with meals.   Insulin Pen Needle 31G X 8 MM Misc Commonly known as:  B-D ULTRAFINE III SHORT PEN Use twice daily as directed with lantus   Krill Oil 300 MG Caps Take 1 capsule by mouth daily.   LANTUS SOLOSTAR 100 UNIT/ML Solostar Pen  Generic drug:  Insulin Glargine INJECT 100 UNITS INTO THE SKIN EVERY MORNING AND 70 UNITS INTO THE SKIN EVERY EVENING.   levothyroxine 75 MCG tablet Commonly known as:  SYNTHROID, LEVOTHROID TAKE 1 TABLET BY MOUTH DAILY BEFORE BREAKFAST except on Tuesday and Saturday take an extra 1/2 of a tab those days   losartan 50 MG tablet Commonly known as:  COZAAR TAKE 1 TABLET(50 MG) BY MOUTH DAILY   metFORMIN 500 MG tablet Commonly known as:  GLUCOPHAGE TAKE 2 TABLETS BY MOUTH TWICE DAILY   MICROLET LANCETS Misc Test twice daily as directed with microlet colored lancets Dx: DM2 obese   mometasone 220 MCG/INH inhaler Commonly known as:  ASMANEX 60 METERED DOSES Inhale 1 puff into the lungs 2 (two) times daily.   NON FORMULARY 1 each by Other route 4 (four) times daily. Accu-check multiclix lancets- use as directed   OSTEO BI-FLEX TRIPLE STRENGTH PO Take 2 tablets by mouth daily.   PROBIOTIC DAILY PO Take by mouth.   promethazine 12.5 MG tablet Commonly known as:  PHENERGAN Take 1 tablet (12.5 mg total) by mouth every 6 (six) hours as needed for nausea or vomiting.   ranitidine 300 MG tablet Commonly known as:  ZANTAC TAKE 1 TABLET(300 MG) BY MOUTH AT BEDTIME   rosuvastatin 40 MG tablet Commonly known as:  CRESTOR Take 1 tablet (40 mg total) by mouth daily.   Syringe (Disposable) 3 ML Misc DX: E11.9/E11.65  Use bid as directed   traMADol 50 MG tablet Commonly known as:  ULTRAM Take 1 tablet (50 mg total) by mouth every 6 (six) hours as needed for severe pain.   vitamin C 1000 MG  tablet Take 1,000 mg by mouth daily.          Objective:   Physical Exam BP 128/78 (BP Location: Left Arm, Patient Position: Sitting, Cuff Size: Normal)   Pulse 85   Temp 98.2 F (36.8 C) (Oral)   Resp 14   Ht 6\' 6"  (1.981 m)   Wt (!) 333 lb 2 oz (151.1 kg)   SpO2 97%   BMI 38.50 kg/m  General:   Well developed, well nourished . NAD.  HEENT:  Normocephalic . Face symmetric, atraumatic.  Right TM obscured by wax, left TM normal.  Nose is slightly congested, sinuses non-TTP.  Throat: No redness or white patches. Lungs:  Slightly decreased breath sounds but no crackles or wheezing. Normal respiratory effort, no intercostal retractions, no accessory muscle use. Heart: RRR,  no murmur.  Trace pretibial edema bilaterally  Skin: Not pale. Not jaundice Neurologic:  alert & oriented X3.  Speech normal, gait appropriate for age and unassisted Psych--  Cognition and judgment appear intact.  Cooperative with normal attention span and concentration.  Behavior appropriate. No anxious or depressed appearing.      Assessment & Plan:   61 year old male with multiple medical problems including diabetes, high cholesterol, hypertension, OSA, skin cancer BCC with lung metastases, presents with  URI: sx c/w  URI, vital signs are stable, does not look toxic.  He is however  prone to complications d/t his PMH. Plan: Conservative treatment first;  amoxicillin if not improving, strongly advised to seek further medical help if he has severe symptoms.

## 2017-03-19 ENCOUNTER — Other Ambulatory Visit: Payer: Self-pay | Admitting: Family Medicine

## 2017-03-19 ENCOUNTER — Telehealth: Payer: Self-pay | Admitting: Family Medicine

## 2017-03-19 MED ORDER — ALPRAZOLAM 0.25 MG PO TABS: 0.2500 mg | ORAL_TABLET | Freq: Every day | ORAL | 3 refills | Status: DC | PRN

## 2017-03-19 MED ORDER — LEVOTHYROXINE SODIUM 75 MCG PO TABS
ORAL_TABLET | ORAL | 1 refills | Status: DC
Start: 2017-03-19 — End: 2017-07-13

## 2017-03-19 MED ORDER — LOSARTAN POTASSIUM 50 MG PO TABS: ORAL_TABLET | ORAL | 1 refills | Status: DC

## 2017-03-19 NOTE — Telephone Encounter (Signed)
All done please forward the alprazolam

## 2017-03-19 NOTE — Telephone Encounter (Signed)
Please advise 

## 2017-03-19 NOTE — Telephone Encounter (Signed)
Copied from [place] [id number]. Topic: Quick Communication - Rx Refill/Question >> Mar 19, 2017 10:14 AM Steven Ibarra wrote: Self.   Refill for ALPRAZolam,  levothyroxine,  losartan   Pharmacy: [facility] [place], [place]. [name]  Agent: Please be advised that RX refills may take up to 3 business days. We ask that you follow-up with your pharmacy.

## 2017-03-22 ENCOUNTER — Other Ambulatory Visit: Payer: Self-pay | Admitting: Family Medicine

## 2017-03-22 MED ORDER — INSULIN PEN NEEDLE 31G X 8 MM MISC: 5 refills | Status: DC

## 2017-03-22 NOTE — Telephone Encounter (Signed)
Copied from [place]. Topic: Quick Communication - Rx Refill/Question >> Mar 22, 2017  1:20 PM Eulogio Bear J wrote: Has the patient contacted their pharmacy? Yes.     (Agent: If no, request that the patient contact the pharmacy for the refill.)   Preferred Pharmacy (with phone number or street name): [facility] [place] Insulin Pen Needle (B-D ULTRAFINE III SHORT PEN) 31G X 8 MM MISC cb# [phone]    Agent: Please be advised that RX refills may take up to 3 business days. We ask that you follow-up with your pharmacy.

## 2017-03-26 ENCOUNTER — Telehealth: Payer: Self-pay | Admitting: Family Medicine

## 2017-03-26 DIAGNOSIS — C7802 Secondary malignant neoplasm of left lung: Secondary | ICD-10-CM | POA: Diagnosis not present

## 2017-03-26 DIAGNOSIS — C7801 Secondary malignant neoplasm of right lung: Secondary | ICD-10-CM | POA: Diagnosis not present

## 2017-03-26 DIAGNOSIS — C7800 Secondary malignant neoplasm of unspecified lung: Secondary | ICD-10-CM | POA: Diagnosis not present

## 2017-03-26 DIAGNOSIS — Z85828 Personal history of other malignant neoplasm of skin: Secondary | ICD-10-CM | POA: Diagnosis not present

## 2017-03-26 DIAGNOSIS — C770 Secondary and unspecified malignant neoplasm of lymph nodes of head, face and neck: Secondary | ICD-10-CM | POA: Diagnosis not present

## 2017-03-26 DIAGNOSIS — C779 Secondary and unspecified malignant neoplasm of lymph node, unspecified: Secondary | ICD-10-CM | POA: Diagnosis not present

## 2017-03-26 DIAGNOSIS — C760 Malignant neoplasm of head, face and neck: Secondary | ICD-10-CM | POA: Diagnosis not present

## 2017-03-26 MED ORDER — SYRINGE (DISPOSABLE) 3 ML MISC: 5 refills | Status: DC

## 2017-03-26 NOTE — Telephone Encounter (Signed)
Copied from [place] [id number]. Topic: Quick Communication - See Telephone Encounter >> Mar 26, 2017 10:11 AM Hewitt Shorts wrote: CRM for notification. See Telephone encounter for: pt called stating that the pen needles that were supposed to have sent over on 03/22/17 at 152pm  Please resend to [facility] [address]   03/26/17.

## 2017-03-26 NOTE — Telephone Encounter (Addendum)
°  Relation to pt: self  Call back number: [phone] Pharmacy: [facility], [place]. [place] [place] [phone] (Phone) [phone] (Fax)     Reason for call:  Patient states syringes should have been sent in not the needles, please advise

## 2017-03-26 NOTE — Addendum Note (Signed)
Addended by: Damita Dunnings D on: 03/26/2017 10:10 AM   Modules accepted: Orders

## 2017-03-26 NOTE — Telephone Encounter (Signed)
Rx sent 

## 2017-04-06 ENCOUNTER — Other Ambulatory Visit (INDEPENDENT_AMBULATORY_CARE_PROVIDER_SITE_OTHER): Payer: Commercial Managed Care - PPO

## 2017-04-06 DIAGNOSIS — E1169 Type 2 diabetes mellitus with other specified complication: Secondary | ICD-10-CM

## 2017-04-06 DIAGNOSIS — E039 Hypothyroidism, unspecified: Secondary | ICD-10-CM

## 2017-04-06 DIAGNOSIS — E782 Mixed hyperlipidemia: Secondary | ICD-10-CM

## 2017-04-06 DIAGNOSIS — E669 Obesity, unspecified: Secondary | ICD-10-CM

## 2017-04-06 DIAGNOSIS — I10 Essential (primary) hypertension: Secondary | ICD-10-CM | POA: Diagnosis not present

## 2017-04-06 LAB — LIPID PANEL
CHOL/HDL RATIO: 4
Cholesterol: 99 mg/dL (ref 0–200)
HDL: 22 mg/dL — ABNORMAL LOW (ref >39.00)
NONHDL: 76.99
Triglycerides: 291 mg/dL — ABNORMAL HIGH (ref 0.0–149.0)
VLDL: 58.2 mg/dL — ABNORMAL HIGH (ref 0.0–40.0)

## 2017-04-06 LAB — COMPREHENSIVE METABOLIC PANEL
ALT: 19 U/L (ref 0–53)
AST: 15 U/L (ref 0–37)
Albumin: 4 g/dL (ref 3.5–5.2)
Alkaline Phosphatase: 35 U/L — ABNORMAL LOW (ref 39–117)
BILIRUBIN TOTAL: 0.5 mg/dL (ref 0.2–1.2)
BUN: 16 mg/dL (ref 6–23)
CO2: 30 meq/L (ref 19–32)
CREATININE: 1.04 mg/dL (ref 0.40–1.50)
Calcium: 9 mg/dL (ref 8.4–10.5)
Chloride: 103 mEq/L (ref 96–112)
GFR: 77.31 mL/min (ref >60.00)
GLUCOSE: 236 mg/dL — AB (ref 70–99)
Potassium: 4.4 mEq/L (ref 3.5–5.1)
Sodium: 139 mEq/L (ref 135–145)
Total Protein: 6.2 g/dL (ref 6.0–8.3)

## 2017-04-06 LAB — CBC
HEMATOCRIT: 42.2 % (ref 39.0–52.0)
Hemoglobin: 14.4 g/dL (ref 13.0–17.0)
MCHC: 34.1 g/dL (ref 30.0–36.0)
MCV: 87.5 fl (ref 78.0–100.0)
PLATELETS: 171 10*3/uL (ref 150.0–400.0)
RBC: 4.82 Mil/uL (ref 4.22–5.81)
RDW: 14.8 % (ref 11.5–15.5)
WBC: 4.5 10*3/uL (ref 4.0–10.5)

## 2017-04-06 LAB — HEMOGLOBIN A1C: Hgb A1c MFr Bld: 8.8 % — ABNORMAL HIGH (ref 4.6–6.5)

## 2017-04-06 LAB — TSH: TSH: 4.29 u[IU]/mL (ref 0.35–4.50)

## 2017-04-06 LAB — LDL CHOLESTEROL, DIRECT: LDL DIRECT: 43 mg/dL

## 2017-04-13 ENCOUNTER — Encounter: Payer: Self-pay | Admitting: Family Medicine

## 2017-04-13 ENCOUNTER — Ambulatory Visit (INDEPENDENT_AMBULATORY_CARE_PROVIDER_SITE_OTHER): Payer: Commercial Managed Care - PPO | Admitting: Family Medicine

## 2017-04-13 VITALS — BP 157/86 | HR 87 | Temp 97.7°F | Resp 18 | Ht 77.95 in | Wt 326.2 lb

## 2017-04-13 DIAGNOSIS — L989 Disorder of the skin and subcutaneous tissue, unspecified: Secondary | ICD-10-CM

## 2017-04-13 DIAGNOSIS — E039 Hypothyroidism, unspecified: Secondary | ICD-10-CM

## 2017-04-13 DIAGNOSIS — C7802 Secondary malignant neoplasm of left lung: Secondary | ICD-10-CM

## 2017-04-13 DIAGNOSIS — Z9989 Dependence on other enabling machines and devices: Secondary | ICD-10-CM | POA: Diagnosis not present

## 2017-04-13 DIAGNOSIS — E669 Obesity, unspecified: Secondary | ICD-10-CM | POA: Diagnosis not present

## 2017-04-13 DIAGNOSIS — G4733 Obstructive sleep apnea (adult) (pediatric): Secondary | ICD-10-CM | POA: Diagnosis not present

## 2017-04-13 DIAGNOSIS — M25561 Pain in right knee: Secondary | ICD-10-CM | POA: Diagnosis not present

## 2017-04-13 DIAGNOSIS — E782 Mixed hyperlipidemia: Secondary | ICD-10-CM

## 2017-04-13 DIAGNOSIS — E1169 Type 2 diabetes mellitus with other specified complication: Secondary | ICD-10-CM

## 2017-04-13 DIAGNOSIS — I10 Essential (primary) hypertension: Secondary | ICD-10-CM | POA: Diagnosis not present

## 2017-04-13 MED ORDER — LOSARTAN POTASSIUM 50 MG PO TABS: 50.0000 mg | ORAL_TABLET | Freq: Two times a day (BID) | ORAL | 1 refills | Status: DC

## 2017-04-13 MED ORDER — COLESEVELAM HCL 3.75 G PO PACK: 3.7500 g | PACK | Freq: Every day | ORAL | 5 refills | Status: DC

## 2017-04-13 MED ORDER — INSULIN LISPRO 100 UNIT/ML ~~LOC~~ SOLN: 24.0000 [IU] | Freq: Three times a day (TID) | SUBCUTANEOUS | 3 refills | Status: DC

## 2017-04-13 NOTE — Assessment & Plan Note (Signed)
Is using CPAP nightly

## 2017-04-13 NOTE — Assessment & Plan Note (Signed)
Receives treatments monthly at [facility].

## 2017-04-13 NOTE — Patient Instructions (Signed)
Cholesterol Cholesterol is a fat. Your body needs a small amount of cholesterol. Cholesterol (plaque) may build up in your blood vessels (arteries). That makes you more likely to have a heart attack or stroke. You cannot feel your cholesterol level. Having a blood test is the only way to find out if your level is high. Keep your test results. Work with your doctor to keep your cholesterol at a good level. What do the results mean?  Total cholesterol is how much cholesterol is in your blood.  LDL is bad cholesterol. This is the type that can build up. Try to have low LDL.  HDL is good cholesterol. It cleans your blood vessels and carries LDL away. Try to have high HDL.  Triglycerides are fat that the body can store or burn for energy. What are good levels of cholesterol?  Total cholesterol below 200.  LDL below 100 is good for people who have health risks. LDL below 70 is good for people who have very high risks.  HDL above 40 is good. It is best to have HDL of 60 or higher.  Triglycerides below 150. How can I lower my cholesterol? Diet Follow your diet program as told by your doctor.  Choose fish, white meat chicken, or turkey that is roasted or baked. Try not to eat red meat, fried foods, sausage, or lunch meats.  Eat lots of fresh fruits and vegetables.  Choose whole grains, beans, pasta, potatoes, and cereals.  Choose olive oil, corn oil, or canola oil. Only use small amounts.  Try not to eat butter, mayonnaise, shortening, or palm kernel oils.  Try not to eat foods with trans fats.  Choose low-fat or nonfat dairy foods. ? Drink skim or nonfat milk. ? Eat low-fat or nonfat yogurt and cheeses. ? Try not to drink whole milk or cream. ? Try not to eat ice cream, egg yolks, or full-fat cheeses.  Healthy desserts include angel food cake, ginger snaps, animal crackers, hard candy, popsicles, and low-fat or nonfat frozen yogurt. Try not to eat pastries, cakes, pies, and  cookies.  Exercise Follow your exercise program as told by your doctor.  Be more active. Try gardening, walking, and taking the stairs.  Ask your doctor about ways that you can be more active.  Medicine  Take over-the-counter and prescription medicines only as told by your doctor. This information is not intended to replace advice given to you by your health care provider. Make sure you discuss any questions you have with your health care provider. Document Released: 06/12/2008 Document Revised: 10/16/2015 Document Reviewed: 09/26/2015 Elsevier Interactive Patient Education  2018 [employer].  

## 2017-04-13 NOTE — Assessment & Plan Note (Signed)
On back. Referred to dermatology for further consideration has been present a couple of weeks

## 2017-04-13 NOTE — Assessment & Plan Note (Signed)
Encouraged DASH diet, decrease po intake and increase exercise as tolerated. Needs 7-8 hours of sleep nightly. Avoid trans fats, eat small, frequent meals every 4-5 hours with lean proteins, complex carbs and healthy fats. Minimize simple carbs, encouraged bariatric referral but declines for now

## 2017-04-13 NOTE — Assessment & Plan Note (Signed)
A couple of injuries dating back to age 62 now with increased pain and instability, is referred to ortho for further consideration and xray ordered

## 2017-04-13 NOTE — Progress Notes (Signed)
Subjective:  I acted as a Education administrator for [employer]. Yancey Flemings, [place]   Patient ID: Steven Ibarra, male    DOB: 24-Apr-1955, 62 y.o.   MRN: [id number]  Chief Complaint  Patient presents with  . Follow-up    HPI  Patient is in today for follow up visit and he has a couple of concerns.  He is noting that his right knee is progressively worsening.  He has had pain in that knee off and on for many years and has injured it a number of times.  Over the last couple months he feels the knee is gotten weaker and on several occasions has buckled and nearly caused a fall.  He notes stairs and walking on [place] is difficult.  No swelling redness or warmth.  He is also noting he has a new spot on his left flank which is growing.  No recent febrile illness or acute hospitalizations.  No polyuria or polydipsia. Denies CP/palp/SOB/HA/congestion/fevers/GI or GU c/o. Taking meds as prescribed  Patient Care Team: Mosie Lukes, MD as PCP - General   Past Medical History:  Diagnosis Date  . ALLERGIC RHINITIS, SEASONAL 10/23/2009  . Anxiety 09/08/2016  . Basal cell carcinoma of neck 05/28/2011   r suprahyoid, radical neck dissection S/p 6 weeks of targeted radiation therapy   . Cancer ([place])   . CAP (community acquired pneumonia) 03/02/2016  . Costochondritis 09/16/2015  . Diabetes mellitus approx 2003   type 2  . DIABETES MELLITUS, TYPE II 10/23/2009  . DYSPNEA ON EXERTION 10/23/2009  . ELEVATED BLOOD PRESSURE 04/23/2010  . GERD (gastroesophageal reflux disease)   . Hearing loss   . History of radiation therapy 07/06/2011- 08/19/2011   33 Fractions to Right facial nodes through the right neck and right trigeminal nerve  . History of radiation therapy 05/23/14, 05/25/14, 05/28/14, 05/30/14, 06/01/14   SBRT Right upper lullng mass 54 Gy in 3 fractions, Left lower lung mass and adjacent nodules 50 Gy in 5 fractions  . History of radiation therapy 02/11/2015- 02/20/15   Right lung superior upper lobe 50 Gy in 5 fractions,  Left Lung posterior lower lobe 50 Gy in 5 fractions  . Hyperlipidemia   . Hypertension    Does not see a cardiologist, has not had a stress, echo   . Hypothyroid 01/28/2012  . Lung cancer ([place]) 03/08/14   invasive squamous cell carcinoma  . Mixed hyperlipidemia 10/23/2009  . Obesity   . OSA (obstructive sleep apnea)   . Otitis externa of right ear 07/06/2012  . Overweight(278.02) 10/23/2009  . Pedal edema 09/28/2016  . Preventative health care 09/30/2011  . Pulled muscle 07/06/12  . Right calf pain 09/08/2016  . S/P radiation therapy 07/06/11 - 08/19/11   Right Facial and Right Neck Nodes and right Trigeminal  Nerve to Base of Skull/ Total Dose 6600 cGy/ 33 Fractions  . Skin cancer    on nose, 9-10 yrs ago  . SLEEP APNEA, OBSTRUCTIVE 10/23/2009   Sleep study Done [facility]  . SOB (shortness of breath) 09/16/2015  . Tinea pedis of both feet 03/02/2016  . Type II or unspecified type diabetes mellitus with unspecified complication, uncontrolled    type 2     Past Surgical History:  Procedure Laterality Date  . LUNG BIOPSY Right 03/08/2014  . RADICAL NECK DISSECTION  05/28/2011   Procedure: RADICAL NECK DISSECTION;  Surgeon: Melissa Montane, MD;  Location: [facility];  Service: ENT;  Laterality: N/A;  Suprahyoid Neck Dissection  .  skin cancer removal    . TONSILLECTOMY AND ADENOIDECTOMY      Family History  Problem Relation Age of Onset  . Other Mother        CHF  . Stroke Father   . Hyperlipidemia Father   . Heart disease Father        s/p valve replacement  . Hyperlipidemia Brother   . Obesity Brother   . Other Brother        Back pain  . Hyperlipidemia Brother   . Hypertension Brother   . Other Brother        Panic attacks  . Hyperlipidemia Brother   . Anesthesia problems Neg Hx     Social History   Socioeconomic History  . Marital status: Married    Spouse name: Not on file  . Number of children: Not on file  . Years of education: Not on file  . Highest education level: Not on  file  Social Needs  . Financial resource strain: Not on file  . Food insecurity - worry: Not on file  . Food insecurity - inability: Not on file  . Transportation needs - medical: Not on file  . Transportation needs - non-medical: Not on file  Occupational History  . Not on file  Tobacco Use  . Smoking status: Former Smoker    Packs/day: 1.00    Years: 9.00    Pack years: 9.00    Types: Cigarettes    Last attempt to quit: 03/31/1975    Years since quitting: 42.0  . Smokeless tobacco: Never Used  Substance and Sexual Activity  . Alcohol use: No    Alcohol/week: 0.0 oz    Comment: 4 beers a month, 06/12/11 rarely uses now  . Drug use: No  . Sexual activity: Yes  Other Topics Concern  . Not on file  Social History Narrative   Patient is married.   Patient with a history of smoking one pack per day for approximately 29 years from ages of [age]. Patient denies ever having used smokeless tobacco. Patient with rare use of alcohol.      Mother died at age 54 from congestive heart failure complications. Father died at the age of 22 secondary to a stroke.    Outpatient Medications Prior to Visit  Medication Sig Dispense Refill  . albuterol (VENTOLIN HFA) 108 (90 Base) MCG/ACT inhaler Inhale 2 puffs into the lungs every 6 (six) hours as needed for wheezing or shortness of breath. 3 Inhaler 3  . ALPRAZolam (XANAX) 0.25 MG tablet Take 1 tablet (0.25 mg total) by mouth daily as needed. for anxiety 20 tablet 3  . Ascorbic Acid (VITAMIN C) 1000 MG tablet Take 1,000 mg by mouth daily.    . benzonatate (TESSALON) 100 MG capsule TAKE 1 CAPSULE(100 MG) BY MOUTH THREE TIMES DAILY AS NEEDED 40 capsule 0  . Blood Glucose Monitoring Suppl (CONTOUR BLOOD GLUCOSE SYSTEM) DEVI Use twice daily to check blood sugar.  DX E11.9 1 Device 0  . cetirizine (ZYRTEC) 10 MG tablet Take 10 mg by mouth daily as needed for allergies.     . CONTOUR NEXT TEST test strip USE AS DIRECTED TWICE DAILY 100 each 11  .  famciclovir (FAMVIR) 500 MG tablet Take 1 tablet (500 mg total) by mouth 3 (three) times daily. 21 tablet 0  . fenofibrate 160 MG tablet TAKE 1 TABLET BY MOUTH EVERY DAY 90 tablet 0  . fluticasone (FLONASE) 50 MCG/ACT nasal spray Place 2 sprays  into both nostrils daily. 16 g 6  . glucose blood (BAYER CONTOUR TEST) test strip Use twice daily to check blood sugar.  DX E11.9 BAYER CONTOUR NEXT EASY PER INSURANCE 100 each 6  . ibuprofen (ADVIL,MOTRIN) 200 MG tablet Take 600 mg by mouth every 6 (six) hours as needed for pain.     . Insulin Pen Needle (B-D ULTRAFINE III SHORT PEN) 31G X 8 MM MISC Use twice daily as directed with lantus 300 each 5  . Krill Oil 300 MG CAPS Take 1 capsule by mouth daily.    Marland Kitchen LANTUS SOLOSTAR 100 UNIT/ML Solostar Pen INJECT 100 UNITS INTO THE SKIN EVERY MORNING AND 70 UNITS INTO THE SKIN EVERY EVENING. 153 mL 0  . levothyroxine (SYNTHROID, LEVOTHROID) 75 MCG tablet TAKE 1 TABLET BY MOUTH DAILY BEFORE BREAKFAST except on Tuesday and Saturday take an extra 1/2 of a tab those days 105 tablet 1  . metFORMIN (GLUCOPHAGE) 500 MG tablet TAKE 2 TABLETS BY MOUTH TWICE DAILY 360 tablet 0  . MICROLET LANCETS MISC Test twice daily as directed with microlet colored lancets Dx: DM2 obese 100 each 11  . Misc Natural Products (OSTEO BI-FLEX TRIPLE STRENGTH PO) Take 2 tablets by mouth daily.    . mometasone (ASMANEX 60 METERED DOSES) 220 MCG/INH inhaler Inhale 1 puff into the lungs 2 (two) times daily. 1 Inhaler 5  . NON FORMULARY 1 each by Other route 4 (four) times daily. Accu-check multiclix lancets- use as directed    . Probiotic Product (PROBIOTIC DAILY PO) Take by mouth.    . promethazine (PHENERGAN) 12.5 MG tablet Take 1 tablet (12.5 mg total) by mouth every 6 (six) hours as needed for nausea or vomiting. 15 tablet 0  . ranitidine (ZANTAC) 300 MG tablet TAKE 1 TABLET(300 MG) BY MOUTH AT BEDTIME 90 tablet 0  . rosuvastatin (CRESTOR) 40 MG tablet Take 1 tablet (40 mg total) by mouth  daily. 90 tablet 1  . Syringe, Disposable, 3 ML MISC DX: E11.9/E11.65  Use bid as directed 300 each 5  . traMADol (ULTRAM) 50 MG tablet Take 1 tablet (50 mg total) by mouth every 6 (six) hours as needed for severe pain. 20 tablet 0  . amoxicillin (AMOXIL) 875 MG tablet Take 1 tablet (875 mg total) by mouth 2 (two) times daily. 14 tablet 0  . HYDROcodone-acetaminophen (NORCO) 5-325 MG tablet Take 1 tablet by mouth every 6 (six) hours as needed for moderate pain. 20 tablet 0  . insulin lispro (HUMALOG) 100 UNIT/ML injection Inject 0.22 mLs (22 Units total) into the skin 3 (three) times daily with meals. 90 mL 3  . losartan (COZAAR) 50 MG tablet TAKE 1 TABLET(50 MG) BY MOUTH DAILY 90 tablet 1   No facility-administered medications prior to visit.     No Known Allergies  Review of Systems  Constitutional: Positive for malaise/fatigue. Negative for fever.  HENT: Negative for congestion.   Eyes: Negative for blurred vision.  Respiratory: Positive for shortness of breath.   Cardiovascular: Negative for chest pain, palpitations and leg swelling.  Gastrointestinal: Negative for abdominal pain, blood in stool and nausea.  Genitourinary: Negative for dysuria and frequency.  Musculoskeletal: Negative for falls.  Skin: Negative for rash.  Neurological: Negative for dizziness, loss of consciousness and headaches.  Endo/Heme/Allergies: Negative for environmental allergies.  Psychiatric/Behavioral: Negative for depression. The patient is nervous/anxious.        Objective:    Physical Exam  Constitutional: He is oriented to person, place, and  time. He appears well-developed and well-nourished. No distress.  HENT:  Head: Normocephalic and atraumatic.  Eyes: Conjunctivae are normal.  Neck: Neck supple. No thyromegaly present.  Cardiovascular: Normal rate, regular rhythm and normal heart sounds.  No murmur heard. Pulmonary/Chest: Effort normal and breath sounds normal. No respiratory distress.  He has no wheezes.  Abdominal: Soft. Bowel sounds are normal. He exhibits no mass. There is no tenderness.  Musculoskeletal: He exhibits no edema.  Lymphadenopathy:    He has no cervical adenopathy.  Neurological: He is alert and oriented to person, place, and time.  Skin: Skin is warm and dry.  Psychiatric: He has a normal mood and affect. His behavior is normal.    BP (!) 157/86 (BP Location: Left Arm, Patient Position: Sitting, Cuff Size: Large)   Pulse 87   Temp 97.7 F (36.5 C) (Oral)   Resp 18   Ht 6' 5.95" (1.98 m)   Wt (!) 326 lb 3.2 oz (148 kg)   SpO2 97%   BMI 37.74 kg/m  Wt Readings from Last 3 Encounters:  04/13/17 (!) 326 lb 3.2 oz (148 kg)  03/12/17 (!) 333 lb 2 oz (151.1 kg)  12/29/16 (!) 323 lb 9.6 oz (146.8 kg)   BP Readings from Last 3 Encounters:  04/13/17 (!) 157/86  03/12/17 128/78  12/29/16 126/78     Immunization History  Administered Date(s) Administered  . Influenza Split 01/28/2012  . Influenza Whole 02/11/2010, 01/29/2011  . Influenza,inj,Quad PF,6+ Mos 01/19/2013, 12/28/2013, 02/13/2015, 01/22/2016, 12/29/2016  . Pneumococcal Conjugate-13 01/19/2013, 03/04/2015  . Pneumococcal Polysaccharide-23 11/19/2015  . Tdap 05/29/2010    Health Maintenance  Topic Date Due  . FOOT EXAM  10/21/1966  . OPHTHALMOLOGY EXAM  09/04/2015  . HEMOGLOBIN A1C  10/04/2017  . TETANUS/TDAP  05/28/2020  . PNEUMOCOCCAL POLYSACCHARIDE VACCINE (2) 11/18/2020  . COLONOSCOPY  03/18/2022  . INFLUENZA VACCINE  Completed  . Hepatitis C Screening  Completed  . HIV Screening  Completed    Lab Results  Component Value Date   WBC 4.5 04/06/2017   HGB 14.4 04/06/2017   HCT 42.2 04/06/2017   PLT 171.0 04/06/2017   GLUCOSE 236 (H) 04/06/2017   CHOL 99 04/06/2017   TRIG 291.0 (H) 04/06/2017   HDL 22.00 (L) 04/06/2017   LDLDIRECT 43.0 04/06/2017   LDLCALC 18 09/08/2013   ALT 19 04/06/2017   AST 15 04/06/2017   NA 139 04/06/2017   K 4.4 04/06/2017   CL 103  04/06/2017   CREATININE 1.04 04/06/2017   BUN 16 04/06/2017   CO2 30 04/06/2017   TSH 4.29 04/06/2017   PSA 1.46 09/25/2011   INR 0.95 03/08/2014   HGBA1C 8.8 (H) 04/06/2017   MICROALBUR 0.7 09/10/2015    Lab Results  Component Value Date   TSH 4.29 04/06/2017   Lab Results  Component Value Date   WBC 4.5 04/06/2017   HGB 14.4 04/06/2017   HCT 42.2 04/06/2017   MCV 87.5 04/06/2017   PLT 171.0 04/06/2017   Lab Results  Component Value Date   NA 139 04/06/2017   K 4.4 04/06/2017   CO2 30 04/06/2017   GLUCOSE 236 (H) 04/06/2017   BUN 16 04/06/2017   CREATININE 1.04 04/06/2017   BILITOT 0.5 04/06/2017   ALKPHOS 35 (L) 04/06/2017   AST 15 04/06/2017   ALT 19 04/06/2017   PROT 6.2 04/06/2017   ALBUMIN 4.0 04/06/2017   CALCIUM 9.0 04/06/2017   ANIONGAP 5 11/19/2015   EGFR 70 (L) 01/10/2015  GFR 77.31 04/06/2017   Lab Results  Component Value Date   CHOL 99 04/06/2017   Lab Results  Component Value Date   HDL 22.00 (L) 04/06/2017   Lab Results  Component Value Date   LDLCALC 18 09/08/2013   Lab Results  Component Value Date   TRIG 291.0 (H) 04/06/2017   Lab Results  Component Value Date   CHOLHDL 4 04/06/2017   Lab Results  Component Value Date   HGBA1C 8.8 (H) 04/06/2017         Assessment & Plan:   Problem List Items Addressed This Visit    MIXED HYPERLIPIDEMIA    Tolerating statin, encouraged heart healthy diet, avoid trans fats, minimize simple carbs and saturated fats. Increase exercise as tolerated      Relevant Medications   Colesevelam HCl (WELCHOL) 3.75 g PACK   losartan (COZAAR) 50 MG tablet   OSA on CPAP    Is using CPAP nightly      Hypertension    Not well controlled. Increase Losartan to 50 mg to bid and recheck with cmp in 1 month. Encouraged heart healthy diet such as the DASH diet and exercise as tolerated.       Relevant Medications   Colesevelam HCl (WELCHOL) 3.75 g PACK   losartan (COZAAR) 50 MG tablet   Diabetes  mellitus type 2 in obese ([facility])    hgba1c unacceptable, minimize simple carbs. Increase exercise as tolerated. Continue current meds. Continue Lantus and Metformin at same dose and increase Humalog to 24 units tid.      Relevant Medications   insulin lispro (HUMALOG) 100 UNIT/ML injection   losartan (COZAAR) 50 MG tablet   Hypothyroid    On Levothyroxine, continue to monitor      Lung metastases ()    Receives [facility].       Skin lesion    On back. Referred to dermatology for further consideration has been present a couple of weeks      Relevant Orders   Ambulatory referral to Dermatology   Morbid obesity ([place])    Encouraged DASH diet, decrease po intake and increase exercise as tolerated. Needs 7-8 hours of sleep nightly. Avoid trans fats, eat small, frequent meals every 4-5 hours with lean proteins, complex carbs and healthy fats. Minimize simple carbs, encouraged bariatric referral but declines for now      Relevant Medications   insulin lispro (HUMALOG) 100 UNIT/ML injection   Right knee pain - Primary    A couple of injuries dating back to age 76 now with increased pain and instability, is referred to ortho for further consideration and xray ordered      Relevant Orders   Ambulatory referral to Orthopedic Surgery   DG Knee Complete 4 Views Right      I have discontinued Jessy Oto. Presley's HYDROcodone-acetaminophen and amoxicillin. I have also changed his insulin lispro and losartan. Additionally, I am having him start on Colesevelam HCl. Lastly, I am having him maintain his NON FORMULARY, cetirizine, ibuprofen, Misc Natural Products (OSTEO BI-FLEX TRIPLE STRENGTH PO), vitamin C, Krill Oil, Probiotic Product (PROBIOTIC DAILY PO), glucose blood, CONTOUR BLOOD GLUCOSE SYSTEM, mometasone, promethazine, albuterol, famciclovir, traMADol, fluticasone, benzonatate, rosuvastatin, LANTUS SOLOSTAR, CONTOUR NEXT TEST, MICROLET LANCETS, fenofibrate, ranitidine,  metFORMIN, ALPRAZolam, levothyroxine, Insulin Pen Needle, and Syringe (Disposable).  Meds ordered this encounter  Medications  . insulin lispro (HUMALOG) 100 UNIT/ML injection    Sig: Inject 0.24 mLs (24 Units total) into the skin 3 (  three) times daily with meals.    Dispense:  100 mL    Refill:  3  . Colesevelam HCl (WELCHOL) 3.75 g PACK    Sig: Take 1 packet (3.75 g total) by mouth daily.    Dispense:  30 each    Refill:  5  . losartan (COZAAR) 50 MG tablet    Sig: Take 1 tablet (50 mg total) by mouth 2 (two) times daily.    Dispense:  180 tablet    Refill:  1    CMA served as scribe during this visit. History, Physical and Plan performed by medical provider. Documentation and orders reviewed and attested to.  Penni Homans, MD

## 2017-04-13 NOTE — Assessment & Plan Note (Signed)
Tolerating statin, encouraged heart healthy diet, avoid trans fats, minimize simple carbs and saturated fats. Increase exercise as tolerated 

## 2017-04-13 NOTE — Assessment & Plan Note (Addendum)
Not well controlled. Increase Losartan to 50 mg to bid and recheck with cmp in 1 month. Encouraged heart healthy diet such as the DASH diet and exercise as tolerated.

## 2017-04-13 NOTE — Assessment & Plan Note (Signed)
On Levothyroxine, continue to monitor 

## 2017-04-13 NOTE — Assessment & Plan Note (Signed)
hgba1c unacceptable, minimize simple carbs. Increase exercise as tolerated. Continue current meds. Continue Lantus and Metformin at same dose and increase Humalog to 24 units tid.

## 2017-04-15 ENCOUNTER — Ambulatory Visit (HOSPITAL_BASED_OUTPATIENT_CLINIC_OR_DEPARTMENT_OTHER)
Admission: RE | Admit: 2017-04-15 | Discharge: 2017-04-15 | Disposition: A | Discharge status: Home / Self Care | Payer: Commercial Managed Care - PPO | Source: Ambulatory Visit | Attending: Family Medicine | Admitting: Family Medicine

## 2017-04-15 DIAGNOSIS — M2351 Chronic instability of knee, right knee: Secondary | ICD-10-CM | POA: Insufficient documentation

## 2017-04-15 DIAGNOSIS — M25561 Pain in right knee: Secondary | ICD-10-CM | POA: Diagnosis present

## 2017-04-17 ENCOUNTER — Other Ambulatory Visit: Payer: Self-pay | Admitting: Family Medicine

## 2017-04-28 ENCOUNTER — Other Ambulatory Visit: Payer: Self-pay | Admitting: Family Medicine

## 2017-04-28 ENCOUNTER — Telehealth: Payer: Self-pay | Admitting: Family Medicine

## 2017-04-28 DIAGNOSIS — J189 Pneumonia, unspecified organism: Secondary | ICD-10-CM

## 2017-04-28 NOTE — Telephone Encounter (Signed)
Pt requesting benzonatate refill Last OV: 04/13/17 Last Refill:07/27/16 Pharmacy:[facility] [address] [place] [address] @ [address] [employer]   Sending back to provider to refill

## 2017-04-28 NOTE — Telephone Encounter (Signed)
Copied from [place] [id number]. Topic: Inquiry >> Apr 28, 2017  1:05 PM Pricilla Handler wrote: Reason for CRM: Patient called requesting a refill of benzonatate (TESSALON) 100 MG capsule for coughing. Patient's preferred pharmacy is [facility] [place], [place] [address] Mannan [phone] (Phone) [phone] (Fax).       Thank You!!!

## 2017-04-29 ENCOUNTER — Ambulatory Visit (HOSPITAL_BASED_OUTPATIENT_CLINIC_OR_DEPARTMENT_OTHER)
Admission: RE | Admit: 2017-04-29 | Discharge: 2017-04-29 | Disposition: A | Discharge status: Home / Self Care | Payer: Commercial Managed Care - PPO | Source: Ambulatory Visit | Attending: Medical | Admitting: Medical

## 2017-04-29 ENCOUNTER — Encounter: Payer: Self-pay | Admitting: Medical

## 2017-04-29 ENCOUNTER — Ambulatory Visit: Payer: Commercial Managed Care - PPO | Admitting: Medical

## 2017-04-29 VITALS — BP 150/100 | HR 106 | Temp 98.2°F | Resp 16 | Ht 77.0 in | Wt 330.0 lb

## 2017-04-29 DIAGNOSIS — M7989 Other specified soft tissue disorders: Secondary | ICD-10-CM | POA: Diagnosis not present

## 2017-04-29 DIAGNOSIS — J984 Other disorders of lung: Secondary | ICD-10-CM | POA: Insufficient documentation

## 2017-04-29 DIAGNOSIS — J40 Bronchitis, not specified as acute or chronic: Secondary | ICD-10-CM | POA: Diagnosis not present

## 2017-04-29 DIAGNOSIS — R05 Cough: Secondary | ICD-10-CM | POA: Diagnosis not present

## 2017-04-29 DIAGNOSIS — R062 Wheezing: Secondary | ICD-10-CM | POA: Diagnosis not present

## 2017-04-29 DIAGNOSIS — R059 Cough, unspecified: Secondary | ICD-10-CM

## 2017-04-29 LAB — CBC WITH DIFFERENTIAL/PLATELET
BASOS ABS: 0 10*3/uL (ref 0.0–0.1)
Basophils Relative: 0.6 % (ref 0.0–3.0)
EOS ABS: 0.1 10*3/uL (ref 0.0–0.7)
Eosinophils Relative: 2.9 % (ref 0.0–5.0)
HEMATOCRIT: 41.7 % (ref 39.0–52.0)
Hemoglobin: 14.3 g/dL (ref 13.0–17.0)
LYMPHS PCT: 4.7 % — AB (ref 12.0–46.0)
Lymphs Abs: 0.2 10*3/uL — ABNORMAL LOW (ref 0.7–4.0)
MCHC: 34.3 g/dL (ref 30.0–36.0)
MCV: 86.5 fl (ref 78.0–100.0)
Monocytes Absolute: 0.5 10*3/uL (ref 0.1–1.0)
Monocytes Relative: 11.5 % (ref 3.0–12.0)
NEUTROS ABS: 3.8 10*3/uL (ref 1.4–7.7)
NEUTROS PCT: 80.3 % — AB (ref 43.0–77.0)
PLATELETS: 156 10*3/uL (ref 150.0–400.0)
RBC: 4.82 Mil/uL (ref 4.22–5.81)
RDW: 14.8 % (ref 11.5–15.5)
WBC: 4.7 10*3/uL (ref 4.0–10.5)

## 2017-04-29 MED ORDER — MOMETASONE FUROATE 220 MCG/INH IN AEPB: 1.0000 | INHALATION_SPRAY | Freq: Two times a day (BID) | RESPIRATORY_TRACT | 5 refills | Status: DC

## 2017-04-29 MED ORDER — ALBUTEROL SULFATE HFA 108 (90 BASE) MCG/ACT IN AERS: 2.0000 | INHALATION_SPRAY | Freq: Four times a day (QID) | RESPIRATORY_TRACT | 3 refills | Status: DC | PRN

## 2017-04-29 MED ORDER — DOXYCYCLINE HYCLATE 100 MG PO TABS: 100.0000 mg | ORAL_TABLET | Freq: Two times a day (BID) | ORAL | 0 refills | Status: DC

## 2017-04-29 MED ORDER — IPRATROPIUM BROMIDE HFA 17 MCG/ACT IN AERS: 2.0000 | INHALATION_SPRAY | Freq: Four times a day (QID) | RESPIRATORY_TRACT | 12 refills | Status: DC | PRN

## 2017-04-29 NOTE — Progress Notes (Signed)
Subjective:    Patient ID: Steven Ibarra, male    DOB: [birth year], 62 y.o.   MRN: [id number]  HPI  Pt in reporting since yesterday he feels tired since yesterday. Some dry throat and cough.  He states cough will bring up some mucus. Pt states went to oncologist 6 days ago. Had CA therapy. He had CT done there the other day. This morning he felt like he had a fever.  Lose  Pt states he was wheezing some yesterday and today.  Pt CT report shows  .Increasing size of an elongated pleural-based nodule along the lateral chest wall of the left hemithorax is concerning for potential metastatic disease. 2.Slight increase in size of a juxtaphrenic left lower lobe nodule. 3.Overall similar appearance of postradiation changes in the right hilum and lower lobe of the left lung as well as an unchanged pulmonary nodule in the lower lobe of the left lung. 4.Ancillary findings as described in the body of the report.  Rt leg chronic edema. Occasional gets abrasion that will slowly heal. Since recent abrasion(more than a week ago) he has not wore his compression stocking and now more swelling. Healing scabs.   When pt walks he gets very tired.   Review of Systems  Constitutional: Positive for fever. Negative for chills and fatigue.       Subjective fever this morning.  HENT: Negative for congestion, facial swelling, mouth sores and nosebleeds.   Respiratory: Positive for cough and wheezing. Negative for chest tightness and shortness of breath.   Cardiovascular: Negative for chest pain and palpitations.  Gastrointestinal: Negative for abdominal pain, blood in stool, diarrhea, nausea and vomiting.  Musculoskeletal: Negative for back pain and neck pain.       No right leg pain.  Skin: Negative for rash.  Neurological: Negative for dizziness, weakness, numbness and headaches.  Hematological: Negative for adenopathy. Does not bruise/bleed easily.  Psychiatric/Behavioral: Negative for behavioral  problems and confusion.    Past Medical History:  Diagnosis Date  . ALLERGIC RHINITIS, SEASONAL 10/23/2009  . Anxiety 09/08/2016  . Basal cell carcinoma of neck 05/28/2011   r suprahyoid, radical neck dissection S/p 6 weeks of targeted radiation therapy   . Cancer ([place])   . CAP (community acquired pneumonia) 03/02/2016  . Costochondritis 09/16/2015  . Diabetes mellitus approx 2003   type 2  . DIABETES MELLITUS, TYPE II 10/23/2009  . DYSPNEA ON EXERTION 10/23/2009  . ELEVATED BLOOD PRESSURE 04/23/2010  . GERD (gastroesophageal reflux disease)   . Hearing loss   . History of radiation therapy 07/06/2011- 08/19/2011   33 Fractions to Right facial nodes through the right neck and right trigeminal nerve  . History of radiation therapy 05/23/14, 05/25/14, 05/28/14, 05/30/14, 06/01/14   SBRT Right upper lullng mass 54 Gy in 3 fractions, Left lower lung mass and adjacent nodules 50 Gy in 5 fractions  . History of radiation therapy 02/11/2015- 02/20/15   Right lung superior upper lobe 50 Gy in 5 fractions, Left Lung posterior lower lobe 50 Gy in 5 fractions  . Hyperlipidemia   . Hypertension    Does not see a cardiologist, has not had a stress, echo   . Hypothyroid 01/28/2012  . Lung cancer ([place]) 03/08/14   invasive squamous cell carcinoma  . Mixed hyperlipidemia 10/23/2009  . Obesity   . OSA (obstructive sleep apnea)   . Otitis externa of right ear 07/06/2012  . Overweight(278.02) 10/23/2009  . Pedal edema 09/28/2016  . Preventative health care  09/30/2011  . Pulled muscle 07/06/12  . Right calf pain 09/08/2016  . S/P radiation therapy 07/06/11 - 08/19/11   Right Facial and Right Neck Nodes and right Trigeminal  Nerve to Base of Skull/ Total Dose 6600 cGy/ 33 Fractions  . Skin cancer    on nose, 9-10 yrs ago  . SLEEP APNEA, OBSTRUCTIVE 10/23/2009   Sleep study Done at [facility]  . SOB (shortness of breath) 09/16/2015  . Tinea pedis of both feet 03/02/2016  . Type II or unspecified type diabetes mellitus with  unspecified complication, uncontrolled    type 2      Social History   Socioeconomic History  . Marital status: Married    Spouse name: Not on file  . Number of children: Not on file  . Years of education: Not on file  . Highest education level: Not on file  Social Needs  . Financial resource strain: Not on file  . Food insecurity - worry: Not on file  . Food insecurity - inability: Not on file  . Transportation needs - medical: Not on file  . Transportation needs - non-medical: Not on file  Occupational History  . Not on file  Tobacco Use  . Smoking status: Former Smoker    Packs/day: 1.00    Years: 9.00    Pack years: 9.00    Types: Cigarettes    Last attempt to quit: 03/31/1975    Years since quitting: 42.1  . Smokeless tobacco: Never Used  Substance and Sexual Activity  . Alcohol use: No    Alcohol/week: 0.0 oz    Comment: 4 beers a month, 06/12/11 rarely uses now  . Drug use: No  . Sexual activity: Yes  Other Topics Concern  . Not on file  Social History Narrative   Patient is married.   Patient with a history of smoking one pack per day for approximately 29 years from ages of [age]. Patient denies ever having used smokeless tobacco. Patient with rare use of alcohol.      Mother died at age 64 from congestive heart failure complications. Father died at the age of 38 secondary to a stroke.    Past Surgical History:  Procedure Laterality Date  . LUNG BIOPSY Right 03/08/2014  . RADICAL NECK DISSECTION  05/28/2011   Procedure: RADICAL NECK DISSECTION;  Surgeon: Melissa Montane, MD;  Location: [facility];  Service: ENT;  Laterality: N/A;  Suprahyoid Neck Dissection  . skin cancer removal    . TONSILLECTOMY AND ADENOIDECTOMY      Family History  Problem Relation Age of Onset  . Other Mother        CHF  . Stroke Father   . Hyperlipidemia Father   . Heart disease Father        s/p valve replacement  . Hyperlipidemia Brother   . Obesity Brother   . Other Brother         Back pain  . Hyperlipidemia Brother   . Hypertension Brother   . Other Brother        Panic attacks  . Hyperlipidemia Brother   . Anesthesia problems Neg Hx     No Known Allergies  Current Outpatient Medications on File Prior to Visit  Medication Sig Dispense Refill  . ALPRAZolam (XANAX) 0.25 MG tablet Take 1 tablet (0.25 mg total) by mouth daily as needed. for anxiety 20 tablet 3  . Ascorbic Acid (VITAMIN C) 1000 MG tablet Take 1,000 mg by mouth daily.    Marland Kitchen  benzonatate (TESSALON) 100 MG capsule TAKE 1 CAPSULE(100 MG) BY MOUTH THREE TIMES DAILY AS NEEDED 40 capsule 0  . Blood Glucose Monitoring Suppl (CONTOUR BLOOD GLUCOSE SYSTEM) DEVI Use twice daily to check blood sugar.  DX E11.9 1 Device 0  . cetirizine (ZYRTEC) 10 MG tablet Take 10 mg by mouth daily as needed for allergies.     . Colesevelam HCl (WELCHOL) 3.75 g PACK Take 1 packet (3.75 g total) by mouth daily. 30 each 5  . CONTOUR NEXT TEST test strip USE AS DIRECTED TWICE DAILY 100 each 11  . famciclovir (FAMVIR) 500 MG tablet Take 1 tablet (500 mg total) by mouth 3 (three) times daily. 21 tablet 0  . fenofibrate 160 MG tablet TAKE 1 TABLET BY MOUTH EVERY DAY 90 tablet 0  . fluticasone (FLONASE) 50 MCG/ACT nasal spray Place 2 sprays into both nostrils daily. 16 g 6  . glucose blood (BAYER CONTOUR TEST) test strip Use twice daily to check blood sugar.  DX E11.9 BAYER CONTOUR NEXT EASY PER INSURANCE 100 each 6  . ibuprofen (ADVIL,MOTRIN) 200 MG tablet Take 600 mg by mouth every 6 (six) hours as needed for pain.     Marland Kitchen insulin lispro (HUMALOG) 100 UNIT/ML injection Inject 0.24 mLs (24 Units total) into the skin 3 (three) times daily with meals. 100 mL 3  . Insulin Pen Needle (B-D ULTRAFINE III SHORT PEN) 31G X 8 MM MISC Use twice daily as directed with lantus 300 each 5  . Krill Oil 300 MG CAPS Take 1 capsule by mouth daily.    Marland Kitchen LANTUS SOLOSTAR 100 UNIT/ML Solostar Pen INJECT 100 UNITS INTO THE SKIN EVERY MORNING AND 70 UNITS INTO THE  SKIN EVERY EVENING. 153 mL 0  . levothyroxine (SYNTHROID, LEVOTHROID) 75 MCG tablet TAKE 1 TABLET BY MOUTH DAILY BEFORE BREAKFAST except on Tuesday and Saturday take an extra 1/2 of a tab those days 105 tablet 1  . losartan (COZAAR) 50 MG tablet Take 1 tablet (50 mg total) by mouth 2 (two) times daily. 180 tablet 1  . metFORMIN (GLUCOPHAGE) 500 MG tablet TAKE 2 TABLETS BY MOUTH TWICE DAILY 360 tablet 0  . MICROLET LANCETS MISC Test twice daily as directed with microlet colored lancets Dx: DM2 obese 100 each 11  . Misc Natural Products (OSTEO BI-FLEX TRIPLE STRENGTH PO) Take 2 tablets by mouth daily.    . NON FORMULARY 1 each by Other route 4 (four) times daily. Accu-check multiclix lancets- use as directed    . Probiotic Product (PROBIOTIC DAILY PO) Take by mouth.    . promethazine (PHENERGAN) 12.5 MG tablet Take 1 tablet (12.5 mg total) by mouth every 6 (six) hours as needed for nausea or vomiting. 15 tablet 0  . ranitidine (ZANTAC) 300 MG tablet TAKE 1 TABLET(300 MG) BY MOUTH AT BEDTIME 90 tablet 0  . rosuvastatin (CRESTOR) 40 MG tablet TAKE 1 TABLET(40 MG) BY MOUTH DAILY 90 tablet 0  . Syringe, Disposable, 3 ML MISC DX: E11.9/E11.65  Use bid as directed 300 each 5  . traMADol (ULTRAM) 50 MG tablet Take 1 tablet (50 mg total) by mouth every 6 (six) hours as needed for severe pain. 20 tablet 0   No current facility-administered medications on file prior to visit.     BP (!) 150/100 (BP Location: Left Arm, Patient Position: Sitting, Cuff Size: Large)   Pulse (!) 106   Temp 98.2 F (36.8 C) (Oral)   Resp 16   Ht 6\' 5"  (1.956  m)   Wt (!) 330 lb (149.7 kg)   SpO2 96%   BMI 39.13 kg/m       Objective:   Physical Exam  General  Mental Status - Alert. General Appearance - Well groomed. Not in acute distress.  Skin Rashes- No Rashes.  HEENT Head- Normal. Ear Auditory Canal - Left- Normal. Right - Normal.Tympanic Membrane- Left- Normal. Right- Normal. Eye Sclera/Conjunctiva-  Left- Normal. Right- Normal. Nose & Sinuses Nasal Mucosa- Left-  Boggy and Congested. Right-  Boggy and  Congested.Bilateral no maxillary and no frontal sinus pressure. Mouth & Throat Lips: Upper Lip- Normal: no dryness, cracking, pallor, cyanosis, or vesicular eruption. Lower Lip-Normal: no dryness, cracking, pallor, cyanosis or vesicular eruption. Buccal Mucosa- Bilateral- No Aphthous ulcers. Oropharynx- No Discharge or Erythema. Tonsils: Characteristics- Bilateral- No Erythema or Congestion. Size/Enlargement- Bilateral- No enlargement. Discharge- bilateral-None.  Neck Neck- Supple. No Masses.   Chest and Lung Exam Auscultation: Breath Sounds:-Clear even and unlabored.(Walking down hall O2 sat decreased to about 93%.  Pulse increased to 112.)   Cardiovascular Auscultation:Rythm- Regular, rate and rhythm. Murmurs & Other Heart Sounds:Ausculatation of the heart reveal- No Murmurs.  Lymphatic Head & Neck General Head & Neck Lymphatics: Bilateral: Description- No Localized lymphadenopathy.   Lower extremity-right lower  mildly swollen distal one third aspect.  Some small scabs present.  Negative Homans sign.(Patient states area chronically swollen more so than the left side.) Left lower extremity-no swelling.  Negative Homans sign.       Assessment & Plan:  Appear to have bronchitis with some mild wheezing recently.  Possibly pneumonia and will get x-ray of chest today as well as CBC.(proceed with caution in light of recent CA treatments)  Prescribing doxycycline antibiotic. The for wheezing/shortness of breath albuterol inhaler sent to your pharmacy.  Also restart Asmanex and also sent in Atrovent.  If your symptoms do not improve some by tomorrow afternoon then would need to consider getting CT of thorax and right lower extremity ultrasound.  You declined doing the ultrasound of lower extremity today.  But will follow you closely.  If you have worsening symptoms after hours  despite the above measures then please be seen at the emergency department.  Discussed treatment plan with Dr. Charlett Blake.  For cough continue benzonatate.  Follow-up in 4-5 days or as needed.  Mackie Pai, PA-C

## 2017-04-29 NOTE — Patient Instructions (Addendum)
Appear to have bronchitis with some mild wheezing recently.  Possibly pneumonia and will get x-ray of chest today as well as CBC.(proceed with caution in light of recent CA treatments)  Prescribing doxycycline antibiotic. The for wheezing/shortness of breath albuterol inhaler sent to your pharmacy.  Also restart Asmanex and also sent in Atrovent.  If your symptoms do not improve some by tomorrow afternoon then would need to consider getting CT of thorax and right lower extremity ultrasound.  You declined doing the ultrasound of lower extremity today.  But will follow you closely.  If you have worsening symptoms after hours despite the above measures then please be seen at the emergency department.  Discussed treatment plan with Dr. Charlett Blake.  For cough continue benzonatate.  Follow-up in 4-5 days or as needed.

## 2017-04-29 NOTE — Telephone Encounter (Signed)
rx sent in 

## 2017-05-03 ENCOUNTER — Telehealth: Payer: Self-pay | Admitting: Medical

## 2017-05-03 MED ORDER — HYDROCODONE-HOMATROPINE 5-1.5 MG/5ML PO SYRP: 5.0000 mL | ORAL_SOLUTION | Freq: Three times a day (TID) | ORAL | 0 refills | Status: DC | PRN

## 2017-05-03 NOTE — Telephone Encounter (Signed)
I just printed out Hycodan.  He can use that for cough and discontinue the benzonatate.  Unfortunately electronic prescribing at not working so that he has to come pick it up.

## 2017-05-03 NOTE — Telephone Encounter (Signed)
Notified pt rx is ready for pick up

## 2017-05-08 ENCOUNTER — Other Ambulatory Visit: Payer: Self-pay | Admitting: Family Medicine

## 2017-05-18 ENCOUNTER — Other Ambulatory Visit: Payer: Self-pay | Admitting: Family Medicine

## 2017-05-18 ENCOUNTER — Telehealth: Payer: Self-pay | Admitting: *Deleted

## 2017-05-18 ENCOUNTER — Ambulatory Visit (INDEPENDENT_AMBULATORY_CARE_PROVIDER_SITE_OTHER): Payer: Commercial Managed Care - PPO | Admitting: Family Medicine

## 2017-05-18 ENCOUNTER — Other Ambulatory Visit (INDEPENDENT_AMBULATORY_CARE_PROVIDER_SITE_OTHER): Payer: Commercial Managed Care - PPO

## 2017-05-18 VITALS — BP 168/100 | HR 86 | Temp 98.1°F | Resp 18

## 2017-05-18 DIAGNOSIS — I10 Essential (primary) hypertension: Secondary | ICD-10-CM

## 2017-05-18 DIAGNOSIS — F419 Anxiety disorder, unspecified: Secondary | ICD-10-CM

## 2017-05-18 LAB — COMPREHENSIVE METABOLIC PANEL
ALBUMIN: 4 g/dL (ref 3.5–5.2)
ALT: 21 U/L (ref 0–53)
AST: 18 U/L (ref 0–37)
Alkaline Phosphatase: 34 U/L — ABNORMAL LOW (ref 39–117)
BUN: 17 mg/dL (ref 6–23)
CALCIUM: 9.6 mg/dL (ref 8.4–10.5)
CHLORIDE: 103 meq/L (ref 96–112)
CO2: 31 mEq/L (ref 19–32)
Creatinine, Ser: 1.05 mg/dL (ref 0.40–1.50)
GFR: 76.43 mL/min (ref >60.00)
Glucose, Bld: 149 mg/dL — ABNORMAL HIGH (ref 70–99)
Potassium: 4.3 mEq/L (ref 3.5–5.1)
SODIUM: 138 meq/L (ref 135–145)
Total Bilirubin: 0.6 mg/dL (ref 0.2–1.2)
Total Protein: 6.5 g/dL (ref 6.0–8.3)

## 2017-05-18 MED ORDER — COLESEVELAM HCL 625 MG PO TABS: 1875.0000 mg | ORAL_TABLET | Freq: Two times a day (BID) | ORAL | 3 refills | Status: DC

## 2017-05-18 MED ORDER — METOPROLOL SUCCINATE ER 25 MG PO TB24: 25.0000 mg | ORAL_TABLET | Freq: Every day | ORAL | 0 refills | Status: DC

## 2017-05-18 MED ORDER — ALPRAZOLAM 0.25 MG PO TABS: 0.2500 mg | ORAL_TABLET | Freq: Two times a day (BID) | ORAL | 1 refills | Status: DC | PRN

## 2017-05-18 NOTE — Progress Notes (Signed)
Pre visit review using our clinic review tool, if applicable. No additional management support is needed unless otherwise documented below in the visit note.   Pt here for BP check. BP Readings from Last 3 Encounters:  04/29/17 (!) 150/100  04/13/17 (!) 157/86  03/12/17 128/78    Pt currently maintained on: losartan 50mg  twice a day.  Pt reports compliance with medication.  BP today @ 9:35am is 169/91. Pt reports he did not sleep well last night due to being nauseated. Still has slight nausea this morning.   Repeat BP @ 9:45am = 168/100  Add metoprolol XL 25mg  once a day and follow up for repeat BP check in 1-2 weeks.  Nurse BP check note reviewed. Agree with documention and plan.

## 2017-05-18 NOTE — Telephone Encounter (Signed)
Called patient to inform of new prescription that has been sent in. Verbalized understanding.

## 2017-05-18 NOTE — Telephone Encounter (Signed)
Pt states he has noticed lately that he seems more anxious around the time of his chemo treatments. States he used to be over it in a day or so but now seems anxious for 3-4 days a week and some days has to take 2 tablets in a day. He is wondering if his dose or quantity should be increased although his 20 tablets from December are just running out and he says he has not used any of his remaining refills. He is worried that he will run out early in the future if he has to take more than prescribed of his current dose. Please advise?

## 2017-05-18 NOTE — Telephone Encounter (Signed)
D/c welchol powder and try Welchol caps, 3 caps po bid, disp 180 with 3 rf

## 2017-05-18 NOTE — Telephone Encounter (Signed)
Pt states he is having difficulty getting the welchol powder mixed and taken. He would like to be switched to pills as he thinks he could tolerate this better.  Please advise?

## 2017-05-18 NOTE — Telephone Encounter (Signed)
I sent in a refill for his Alprazolam so he can take up to 2 tabs a day when he is getting his treatments especially. If that is not helpful he should come in for a visit.

## 2017-05-18 NOTE — Patient Instructions (Addendum)
Begin metoprolol XL 25mg  once a day for your blood pressure (sent to pharmacy). This is in addition to the Losartan 50mg  twice a day.   We will see you for repeat BP check on 06/04/17 at 9am.

## 2017-05-19 DIAGNOSIS — D1801 Hemangioma of skin and subcutaneous tissue: Secondary | ICD-10-CM | POA: Diagnosis not present

## 2017-05-19 DIAGNOSIS — I872 Venous insufficiency (chronic) (peripheral): Secondary | ICD-10-CM | POA: Diagnosis not present

## 2017-05-19 DIAGNOSIS — L821 Other seborrheic keratosis: Secondary | ICD-10-CM | POA: Diagnosis not present

## 2017-05-19 NOTE — Telephone Encounter (Signed)
Left message for pt to return my call.

## 2017-05-21 DIAGNOSIS — C7801 Secondary malignant neoplasm of right lung: Secondary | ICD-10-CM | POA: Diagnosis not present

## 2017-05-21 DIAGNOSIS — C779 Secondary and unspecified malignant neoplasm of lymph node, unspecified: Secondary | ICD-10-CM | POA: Diagnosis not present

## 2017-05-21 DIAGNOSIS — Z5111 Encounter for antineoplastic chemotherapy: Secondary | ICD-10-CM | POA: Diagnosis not present

## 2017-05-21 DIAGNOSIS — C7802 Secondary malignant neoplasm of left lung: Secondary | ICD-10-CM | POA: Diagnosis not present

## 2017-05-21 DIAGNOSIS — G8929 Other chronic pain: Secondary | ICD-10-CM | POA: Insufficient documentation

## 2017-05-21 NOTE — Telephone Encounter (Signed)
Attempted to reach pt and left message for pt to check mychart. Message sent.

## 2017-06-04 ENCOUNTER — Ambulatory Visit (INDEPENDENT_AMBULATORY_CARE_PROVIDER_SITE_OTHER): Payer: Commercial Managed Care - PPO | Admitting: Family Medicine

## 2017-06-04 DIAGNOSIS — I10 Essential (primary) hypertension: Secondary | ICD-10-CM | POA: Diagnosis not present

## 2017-06-04 NOTE — Progress Notes (Signed)
Pre visit review using our clinic review tool, if applicable. No additional management support is needed unless otherwise documented below in the visit note.  Pt here for blood pressure check per Dr. Charlett Blake.  BP Readings from Last 3 Encounters:  05/18/17 (!) 168/100  04/29/17 (!) 150/100  04/13/17 (!) 157/86    Pt currently taking: Losartan 50 mg twice daily and Metoprolol XL 25 mg once daily.  Pt reports compliance with medication. No reported side effects.   BP today @ 9:10 am is 168/101.  Repeat BP @ 9:20 am is 157/93  Increase metoprolol XL from 25mg  once a day to 50 mg once a day and continue Losartan 50 mg twice daily. Follow up for repeat BP check in 1-2 weeks.    Nursing blood pressure check note reviewed. Agree with documention and plan.

## 2017-06-04 NOTE — Patient Instructions (Signed)
Increase metoprolol XL from 25mg  once a day to 50 mg once a day and continue Losartan 50 mg twice daily. Follow up for repeat BP check in 1-2 weeks.

## 2017-06-14 ENCOUNTER — Other Ambulatory Visit: Payer: Self-pay | Admitting: *Deleted

## 2017-06-14 ENCOUNTER — Other Ambulatory Visit: Payer: Self-pay | Admitting: Family Medicine

## 2017-06-14 ENCOUNTER — Telehealth: Payer: Self-pay | Admitting: Family Medicine

## 2017-06-14 MED ORDER — METOPROLOL SUCCINATE ER 25 MG PO TB24: 25.0000 mg | ORAL_TABLET | Freq: Every day | ORAL | 0 refills | Status: DC

## 2017-06-14 NOTE — Telephone Encounter (Signed)
Copied from [place] [phone]. Topic: Quick Communication - Rx Refill/Question >> Jun 14, 2017 10:51 AM Corie Chiquito, NT wrote: Medication:Metoprolo- Succinate 25 mg   Has the patient contacted their pharmacy? Yes   Patient is calling because he needs his new prescription of the above medication. Stated that he is supposed to be taking 2 pills a day instead of 1 pill a day.   Preferred Pharmacy (with phone number or street name):[facility] in [place]. [address] [phone]   Agent: Please be advised that RX refills may take up to 3 business days. We ask that you follow-up with your pharmacy.

## 2017-06-15 ENCOUNTER — Other Ambulatory Visit: Payer: Self-pay | Admitting: Family Medicine

## 2017-06-16 ENCOUNTER — Ambulatory Visit (INDEPENDENT_AMBULATORY_CARE_PROVIDER_SITE_OTHER): Payer: Commercial Managed Care - PPO | Admitting: Medical

## 2017-06-16 ENCOUNTER — Encounter: Payer: Self-pay | Admitting: Medical

## 2017-06-16 VITALS — BP 165/94 | HR 87

## 2017-06-16 DIAGNOSIS — I10 Essential (primary) hypertension: Secondary | ICD-10-CM | POA: Diagnosis not present

## 2017-06-16 MED ORDER — FENOFIBRATE 160 MG PO TABS: 160.0000 mg | ORAL_TABLET | Freq: Every day | ORAL | 1 refills | Status: DC

## 2017-06-16 MED ORDER — METOPROLOL SUCCINATE ER 50 MG PO TB24: 50.0000 mg | ORAL_TABLET | Freq: Every day | ORAL | 0 refills | Status: DC

## 2017-06-16 NOTE — Progress Notes (Signed)
Pre visit review using our clinic review tool, if applicable. No additional management support is needed unless otherwise documented below in the visit note.  Pt here for blood pressure check per Dr Charlett Blake.  Last BP 168/101 and 157/93  Pt currently takes: Losartan 50 mg twice a day. Metoprolol XL 50 mg once a day. (this was increased from 25mg  daily at last visit)  Pt states he had been out of metoprolol since last Friday and just got a refill yesterday. Restarted Metoprolol this morning.  HR = 87 BP today @ 9:47am = 165/94 Repeat BP @ 9:57am = 159/99  Per verbal from [employer], PA-C in PCP's absence,  advised pt to continue current doses of Losartan and Metoprolol since he has been out of Metoprolol since last Friday. He would like pt to return in 5 days for BP recheck.  Appointment scheduled for 06/22/17 at 9:15am. Appt reminder given to patient.  Pt also requests refill of fenofibrate and new Rx for Metoprolol XL 50mg  once a day. Rxs sent.  This was advice I gave after speaking with Gilmore Laroche CMA.  Mackie Pai, PA-C

## 2017-06-18 DIAGNOSIS — I10 Essential (primary) hypertension: Secondary | ICD-10-CM | POA: Diagnosis not present

## 2017-06-18 DIAGNOSIS — C3432 Malignant neoplasm of lower lobe, left bronchus or lung: Secondary | ICD-10-CM | POA: Diagnosis not present

## 2017-06-18 DIAGNOSIS — C799 Secondary malignant neoplasm of unspecified site: Secondary | ICD-10-CM | POA: Diagnosis not present

## 2017-06-18 DIAGNOSIS — C3411 Malignant neoplasm of upper lobe, right bronchus or lung: Secondary | ICD-10-CM | POA: Diagnosis not present

## 2017-06-18 DIAGNOSIS — C7800 Secondary malignant neoplasm of unspecified lung: Secondary | ICD-10-CM | POA: Diagnosis not present

## 2017-06-22 ENCOUNTER — Ambulatory Visit (INDEPENDENT_AMBULATORY_CARE_PROVIDER_SITE_OTHER): Payer: Commercial Managed Care - PPO | Admitting: Family Medicine

## 2017-06-22 DIAGNOSIS — I10 Essential (primary) hypertension: Secondary | ICD-10-CM

## 2017-06-22 NOTE — Patient Instructions (Signed)
Continue taking Losartan 50mg  twice a day. Increase Metoprolol XL to 100mg  once a day and come back for blood pressure recheck on 07/08/17 at 9:30am.

## 2017-06-22 NOTE — Progress Notes (Signed)
Pre visit review using our clinic review tool, if applicable. No additional management support is needed unless otherwise documented below in the visit note.  Pt here for blood pressure check.  BP Readings from Last 3 Encounters:  06/16/17 (!) 165/94  05/18/17 (!) 168/100  04/29/17 (!) 150/100   Pt currently taking: Losartan 50mg  twice a day. Metoprolol XL 50mg  once a day.  BP 9:30am today = 170/106.   Just received infusion on Friday that pt says will sometimes elevate his blood pressure.  Pt says prior to infusion BP had come down to 160s/80s. Pt has been getting Opdivo and thinks last week may be his last infustion. I could not find elevated blood pressure as a listed side effect on medication website.   HR = 80  Repeat BP 9:56am = 169/105.  Advised pt per verbal from PCP to increase metoprolol to 100mg  once a day and return in 2 weeks for nurse visit blood pressure check. Appointment scheduled for 07/08/17 at 9:30am.  RN blood pressure check note reviewed. Agree with documention and plan.

## 2017-06-24 ENCOUNTER — Telehealth: Payer: Self-pay | Admitting: Family Medicine

## 2017-06-24 NOTE — Telephone Encounter (Signed)
Copied from [place]. Topic: Quick Communication - Rx Refill/Question >> Jun 24, 2017  2:47 PM Margot Ables wrote: Medication: pt states metoprolol was increased to 100mg  per day at nurse visit 06/22/17. Pt states the pharmacy has the 50mg  for one per day so he did not pick up that RX. He will be out tomorrow. Pharmacy said no new RX had been sent in since 06/22/17. Has the patient contacted their pharmacy? yes Preferred Pharmacy (with phone number or street name): [facility] [place], [place]. [address] [phone] (Phone) [phone] (Fax)

## 2017-06-25 MED ORDER — METOPROLOL SUCCINATE ER 100 MG PO TB24: 100.0000 mg | ORAL_TABLET | Freq: Every day | ORAL | 0 refills | Status: DC

## 2017-06-25 NOTE — Telephone Encounter (Signed)
Check last ov he was increased to 100mg  daily. Sent rx in to pharmacy

## 2017-06-25 NOTE — Telephone Encounter (Signed)
Left detailed message on patient vmail

## 2017-07-08 ENCOUNTER — Ambulatory Visit: Payer: Commercial Managed Care - PPO

## 2017-07-12 ENCOUNTER — Other Ambulatory Visit (INDEPENDENT_AMBULATORY_CARE_PROVIDER_SITE_OTHER): Payer: Commercial Managed Care - PPO

## 2017-07-12 ENCOUNTER — Other Ambulatory Visit: Payer: Self-pay | Admitting: Family Medicine

## 2017-07-12 DIAGNOSIS — E669 Obesity, unspecified: Secondary | ICD-10-CM | POA: Diagnosis not present

## 2017-07-12 DIAGNOSIS — E782 Mixed hyperlipidemia: Secondary | ICD-10-CM

## 2017-07-12 DIAGNOSIS — E039 Hypothyroidism, unspecified: Secondary | ICD-10-CM

## 2017-07-12 DIAGNOSIS — I10 Essential (primary) hypertension: Secondary | ICD-10-CM | POA: Diagnosis not present

## 2017-07-12 DIAGNOSIS — E1169 Type 2 diabetes mellitus with other specified complication: Secondary | ICD-10-CM

## 2017-07-12 LAB — COMPREHENSIVE METABOLIC PANEL
ALK PHOS: 36 U/L — AB (ref 39–117)
ALT: 16 U/L (ref 0–53)
AST: 15 U/L (ref 0–37)
Albumin: 4 g/dL (ref 3.5–5.2)
BUN: 15 mg/dL (ref 6–23)
CO2: 29 mEq/L (ref 19–32)
Calcium: 9.3 mg/dL (ref 8.4–10.5)
Chloride: 104 mEq/L (ref 96–112)
Creatinine, Ser: 1.05 mg/dL (ref 0.40–1.50)
GFR: 76.39 mL/min (ref >60.00)
GLUCOSE: 127 mg/dL — AB (ref 70–99)
POTASSIUM: 4.6 meq/L (ref 3.5–5.1)
SODIUM: 141 meq/L (ref 135–145)
TOTAL PROTEIN: 6.5 g/dL (ref 6.0–8.3)
Total Bilirubin: 0.5 mg/dL (ref 0.2–1.2)

## 2017-07-12 LAB — LIPID PANEL
Cholesterol: 96 mg/dL (ref 0–200)
HDL: 21.2 mg/dL — AB (ref >39.00)
NONHDL: 74.52
Total CHOL/HDL Ratio: 5
Triglycerides: 251 mg/dL — ABNORMAL HIGH (ref 0.0–149.0)
VLDL: 50.2 mg/dL — ABNORMAL HIGH (ref 0.0–40.0)

## 2017-07-12 LAB — TSH: TSH: 4.61 u[IU]/mL — ABNORMAL HIGH (ref 0.35–4.50)

## 2017-07-12 LAB — CBC
HCT: 41.5 % (ref 39.0–52.0)
Hemoglobin: 14.4 g/dL (ref 13.0–17.0)
MCHC: 34.8 g/dL (ref 30.0–36.0)
MCV: 86.7 fl (ref 78.0–100.0)
Platelets: 174 10*3/uL (ref 150.0–400.0)
RBC: 4.78 Mil/uL (ref 4.22–5.81)
RDW: 14.8 % (ref 11.5–15.5)
WBC: 5.1 10*3/uL (ref 4.0–10.5)

## 2017-07-12 LAB — LDL CHOLESTEROL, DIRECT: LDL DIRECT: 42 mg/dL

## 2017-07-12 LAB — HEMOGLOBIN A1C: Hgb A1c MFr Bld: 7.2 % — ABNORMAL HIGH (ref 4.6–6.5)

## 2017-07-13 MED ORDER — LEVOTHYROXINE SODIUM 88 MCG PO TABS: 88.0000 ug | ORAL_TABLET | Freq: Every day | ORAL | 3 refills | Status: DC

## 2017-07-13 NOTE — Addendum Note (Signed)
Addended by: Wynonia Musty A on: 07/13/2017 09:43 AM   Modules accepted: Orders

## 2017-07-15 ENCOUNTER — Ambulatory Visit: Payer: Commercial Managed Care - PPO | Admitting: Family Medicine

## 2017-07-15 ENCOUNTER — Encounter: Payer: Self-pay | Admitting: Family Medicine

## 2017-07-15 VITALS — BP 168/100 | HR 72 | Temp 97.6°F | Resp 20 | Ht 67.0 in | Wt 329.2 lb

## 2017-07-15 DIAGNOSIS — Z79899 Other long term (current) drug therapy: Secondary | ICD-10-CM | POA: Diagnosis not present

## 2017-07-15 DIAGNOSIS — G4733 Obstructive sleep apnea (adult) (pediatric): Secondary | ICD-10-CM | POA: Diagnosis not present

## 2017-07-15 DIAGNOSIS — E039 Hypothyroidism, unspecified: Secondary | ICD-10-CM

## 2017-07-15 DIAGNOSIS — E1169 Type 2 diabetes mellitus with other specified complication: Secondary | ICD-10-CM | POA: Diagnosis not present

## 2017-07-15 DIAGNOSIS — R600 Localized edema: Secondary | ICD-10-CM

## 2017-07-15 DIAGNOSIS — E782 Mixed hyperlipidemia: Secondary | ICD-10-CM | POA: Diagnosis not present

## 2017-07-15 DIAGNOSIS — I10 Essential (primary) hypertension: Secondary | ICD-10-CM

## 2017-07-15 DIAGNOSIS — Z9989 Dependence on other enabling machines and devices: Secondary | ICD-10-CM

## 2017-07-15 DIAGNOSIS — E669 Obesity, unspecified: Secondary | ICD-10-CM

## 2017-07-15 MED ORDER — LEVOTHYROXINE SODIUM 100 MCG PO TABS
100.0000 ug | ORAL_TABLET | Freq: Every day | ORAL | 1 refills | Status: DC
Start: 2017-07-15 — End: 2018-01-19

## 2017-07-15 MED ORDER — CHLORTHALIDONE 25 MG PO TABS: 25.0000 mg | ORAL_TABLET | Freq: Every day | ORAL | 3 refills | Status: DC

## 2017-07-15 NOTE — Patient Instructions (Addendum)
Please call and schedule your annual diabetic eye exam soon.  Zyrtec up to twice daily Flonase 2 sprays each side daily Nasal saline as need  Thyroid-Stimulating Hormone Test Why am I having this test? A thyroid-stimulating hormone (TSH) test is a blood test that is done to measure the level of TSH, also known as thyrotropin, in your blood. TSH is produced by the pituitary gland. The pituitary gland is a small organ located just below the brain, behind your eyes and nasal passages. It is part of a system that monitors and maintains thyroid hormone levels and thyroid gland function. Thyroid hormones affect many body parts and systems, including the system that affects how quickly your body burns fuel for energy. Your health care provider may recommend testing your TSH level if you have signs and symptoms of abnormal thyroid hormone levels. Knowing the level of TSH in your blood can help your health care provider:  Diagnose a thyroid gland or pituitary gland disorder.  Manage your condition and treatment if you have hypothyroidism or hyperthyroidism.  What kind of sample is taken? A blood sample is required for this test. It is usually collected by inserting a needle into a vein. How do I prepare for this test? There is no preparation required for this test. What are the reference ranges? Reference rangesare considered healthy rangesestablished after testing a large group of healthy people. Reference rangesmay vary among different people, labs, and hospitals. It is your responsibility to obtain your test results. Ask the lab or department performing the test when and how you will get your results. Range of Normal Values:  Adult: 0.3-5 microunits/mL or 0.3-5 milliunits/L (SI units).  Newborn: 21-18 microunits/mL or 3-18 milliunits/L.  Cord: 3-12 microunits/mL or 3-12 milliunits/L.  What do the results mean? A high level of TSH may mean:  Your thyroid gland is not making enough thyroid  hormones. When the thyroid gland does not make enough thyroid hormones, the pituitary gland releases TSH into the bloodstream. The higher-than-normal levels of TSH prompt the thyroid gland to release more thyroid hormones.  You are getting an insufficient level of thyroid hormone medicine, if you are receiving this type of treatment.  There is a problem with the pituitary gland (rare).  A low level of TSH can indicate a problem with the pituitary gland. Talk with your health care provider to discuss your results, treatment options, and if necessary, the need for more tests. Talk with your health care provider if you have any questions about your results. Talk with your health care provider to discuss your results, treatment options, and if necessary, the need for more tests. Talk with your health care provider if you have any questions about your results. This information is not intended to replace advice given to you by your health care provider. Make sure you discuss any questions you have with your health care provider. Document Released: 04/10/2004 Document Revised: 11/17/2015 Document Reviewed: 08/09/2013 Elsevier Interactive Patient Education  [employer].

## 2017-07-16 DIAGNOSIS — R911 Solitary pulmonary nodule: Secondary | ICD-10-CM | POA: Diagnosis not present

## 2017-07-16 DIAGNOSIS — C799 Secondary malignant neoplasm of unspecified site: Secondary | ICD-10-CM | POA: Diagnosis not present

## 2017-07-16 DIAGNOSIS — C7800 Secondary malignant neoplasm of unspecified lung: Secondary | ICD-10-CM | POA: Diagnosis not present

## 2017-07-16 DIAGNOSIS — C779 Secondary and unspecified malignant neoplasm of lymph node, unspecified: Secondary | ICD-10-CM | POA: Diagnosis not present

## 2017-07-16 DIAGNOSIS — C760 Malignant neoplasm of head, face and neck: Secondary | ICD-10-CM | POA: Diagnosis not present

## 2017-07-18 LAB — PAIN MGMT, PROFILE 8 W/CONF, U
6 ACETYLMORPHINE: NEGATIVE ng/mL (ref <10)
AMPHETAMINES: NEGATIVE ng/mL (ref <500)
Alcohol Metabolites: NEGATIVE ng/mL (ref <500)
Benzodiazepines: NEGATIVE ng/mL (ref <100)
Buprenorphine, Urine: NEGATIVE ng/mL (ref <5)
Cocaine Metabolite: NEGATIVE ng/mL (ref <150)
Creatinine: 66.9 mg/dL
MARIJUANA METABOLITE: NEGATIVE ng/mL (ref <20)
MDA: NEGATIVE ng/mL (ref <200)
MDMA: NEGATIVE ng/mL (ref <200)
MDMA: NEGATIVE ng/mL (ref <500)
OXYCODONE: NEGATIVE ng/mL (ref <100)
Opiates: NEGATIVE ng/mL (ref <100)
Oxidant: NEGATIVE ug/mL (ref <200)
pH: 6.33 (ref 4.5–9.0)

## 2017-07-19 NOTE — Assessment & Plan Note (Signed)
Uses CPAP nightly 

## 2017-07-19 NOTE — Assessment & Plan Note (Signed)
hgba1c acceptable, minimize simple carbs. Increase exercise as tolerated. Continue current meds 

## 2017-07-19 NOTE — Assessment & Plan Note (Signed)
Encouraged heart healthy diet, increase exercise, avoid trans fats, consider a krill oil cap daily 

## 2017-07-19 NOTE — Assessment & Plan Note (Signed)
On Levothyroxine, continue to monitor 

## 2017-07-19 NOTE — Assessment & Plan Note (Signed)
Encouraged DASH diet, decrease po intake and increase exercise as tolerated. Needs 7-8 hours of sleep nightly. Avoid trans fats, eat small, frequent meals every 4-5 hours with lean proteins, complex carbs and healthy fats. Minimize simple carbs, GMO foods. 

## 2017-07-19 NOTE — Progress Notes (Signed)
Subjective:    Patient ID: Steven Ibarra, male    DOB: 02/15/1956, 62 y.o.   MRN: [id number]  Chief Complaint  Patient presents with  . Hypertension    Pt here for follow up  . Hypothyroidism    Pt here for follow up  . Hyperlipidemia    Pt here for follow up  . Anxiety    Pt here for follow up  . Diabetes    Pt here for follow up.  Needs foot exam and to schedule eye exam.    HPI Patient is in today for follow up. He reports he is mostly feeling well. He acknowledges he has some days where he is more fatigued. Especially with bad whether he is noting blood sugars 90s to 140s in am most days. No polyuria or polydipsia. Denies CP/palp/SOB/HA/congestion/fevers/GI or GU c/o. Taking meds as prescribed  Past Medical History:  Diagnosis Date  . ALLERGIC RHINITIS, SEASONAL 10/23/2009  . Anxiety 09/08/2016  . Basal cell carcinoma of neck 05/28/2011   r suprahyoid, radical neck dissection S/p 6 weeks of targeted radiation therapy   . Cancer ([place])   . CAP (community acquired pneumonia) 03/02/2016  . Costochondritis 09/16/2015  . Diabetes mellitus approx 2003   type 2  . DIABETES MELLITUS, TYPE II 10/23/2009  . DYSPNEA ON EXERTION 10/23/2009  . ELEVATED BLOOD PRESSURE 04/23/2010  . GERD (gastroesophageal reflux disease)   . Hearing loss   . History of radiation therapy 07/06/2011- 08/19/2011   33 Fractions to Right facial nodes through the right neck and right trigeminal nerve  . History of radiation therapy 05/23/14, 05/25/14, 05/28/14, 05/30/14, 06/01/14   SBRT Right upper lullng mass 54 Gy in 3 fractions, Left lower lung mass and adjacent nodules 50 Gy in 5 fractions  . History of radiation therapy 02/11/2015- 02/20/15   Right lung superior upper lobe 50 Gy in 5 fractions, Left Lung posterior lower lobe 50 Gy in 5 fractions  . Hyperlipidemia   . Hypertension    Does not see a cardiologist, has not had a stress, echo   . Hypothyroid 01/28/2012  . Lung cancer ([place]) 03/08/14   invasive  squamous cell carcinoma  . Mixed hyperlipidemia 10/23/2009  . Obesity   . OSA (obstructive sleep apnea)   . Otitis externa of right ear 07/06/2012  . Overweight(278.02) 10/23/2009  . Pedal edema 09/28/2016  . Preventative health care 09/30/2011  . Pulled muscle 07/06/12  . Right calf pain 09/08/2016  . S/P radiation therapy 07/06/11 - 08/19/11   Right Facial and Right Neck Nodes and right Trigeminal  Nerve to Base of Skull/ Total Dose 6600 cGy/ 33 Fractions  . Skin cancer    on nose, 9-10 yrs ago  . SLEEP APNEA, OBSTRUCTIVE 10/23/2009   Sleep study Done at [facility]  . SOB (shortness of breath) 09/16/2015  . Tinea pedis of both feet 03/02/2016  . Type II or unspecified type diabetes mellitus with unspecified complication, uncontrolled    type 2     Past Surgical History:  Procedure Laterality Date  . LUNG BIOPSY Right 03/08/2014  . RADICAL NECK DISSECTION  05/28/2011   Procedure: RADICAL NECK DISSECTION;  Surgeon: Melissa Montane, MD;  Location: [facility];  Service: ENT;  Laterality: N/A;  Suprahyoid Neck Dissection  . skin cancer removal    . TONSILLECTOMY AND ADENOIDECTOMY      Family History  Problem Relation Age of Onset  . Other Mother  CHF  . Stroke Father   . Hyperlipidemia Father   . Heart disease Father        s/p valve replacement  . Hyperlipidemia Brother   . Obesity Brother   . Other Brother        Back pain  . Hyperlipidemia Brother   . Hypertension Brother   . Other Brother        Panic attacks  . Hyperlipidemia Brother   . Anesthesia problems Neg Hx     Social History   Socioeconomic History  . Marital status: Married    Spouse name: Not on file  . Number of children: Not on file  . Years of education: Not on file  . Highest education level: Not on file  Occupational History  . Not on file  Social Needs  . Financial resource strain: Not on file  . Food insecurity:    Worry: Not on file    Inability: Not on file  . Transportation needs:    Medical: Not on  file    Non-medical: Not on file  Tobacco Use  . Smoking status: Former Smoker    Packs/day: 1.00    Years: 9.00    Pack years: 9.00    Types: Cigarettes    Last attempt to quit: 03/31/1975    Years since quitting: 42.3  . Smokeless tobacco: Never Used  Substance and Sexual Activity  . Alcohol use: No    Alcohol/week: 0.0 oz    Comment: 4 beers a month, 06/12/11 rarely uses now  . Drug use: No  . Sexual activity: Yes  Lifestyle  . Physical activity:    Days per week: Not on file    Minutes per session: Not on file  . Stress: Not on file  Relationships  . Social connections:    Talks on phone: Not on file    Gets together: Not on file    Attends religious service: Not on file    Active member of club or organization: Not on file    Attends meetings of clubs or organizations: Not on file    Relationship status: Not on file  . Intimate partner violence:    Fear of current or ex partner: Not on file    Emotionally abused: Not on file    Physically abused: Not on file    Forced sexual activity: Not on file  Other Topics Concern  . Not on file  Social History Narrative   Patient is married.   Patient with a history of smoking one pack per day for approximately 29 years from ages of [age]. Patient denies ever having used smokeless tobacco. Patient with rare use of alcohol.      Mother died at age 69 from congestive heart failure complications. Father died at the age of 72 secondary to a stroke.    Outpatient Medications Prior to Visit  Medication Sig Dispense Refill  . albuterol (VENTOLIN HFA) 108 (90 Base) MCG/ACT inhaler Inhale 2 puffs into the lungs every 6 (six) hours as needed for wheezing or shortness of breath. 3 Inhaler 3  . ALPRAZolam (XANAX) 0.25 MG tablet Take 1 tablet (0.25 mg total) by mouth 2 (two) times daily as needed. for anxiety 60 tablet 1  . Ascorbic Acid (VITAMIN C) 1000 MG tablet Take 1,000 mg by mouth daily.    . Blood Glucose Monitoring Suppl (CONTOUR  BLOOD GLUCOSE SYSTEM) DEVI Use twice daily to check blood sugar.  DX E11.9 1 Device 0  .  cetirizine (ZYRTEC) 10 MG tablet Take 10 mg by mouth daily as needed for allergies.     . colesevelam (WELCHOL) 625 MG tablet Take 3 tablets (1,875 mg total) by mouth 2 (two) times daily with a meal. 180 tablet 3  . CONTOUR NEXT TEST test strip USE AS DIRECTED TWICE DAILY 100 each 11  . fenofibrate 160 MG tablet Take 1 tablet (160 mg total) by mouth daily. 90 tablet 1  . fluticasone (FLONASE) 50 MCG/ACT nasal spray Place 2 sprays into both nostrils daily. 16 g 6  . glucose blood (BAYER CONTOUR TEST) test strip Use twice daily to check blood sugar.  DX E11.9 BAYER CONTOUR NEXT EASY PER INSURANCE 100 each 6  . HYDROcodone-homatropine (HYCODAN) 5-1.5 MG/5ML syrup Take 5 mLs by mouth every 8 (eight) hours as needed. 100 mL 0  . ibuprofen (ADVIL,MOTRIN) 200 MG tablet Take 600 mg by mouth every 6 (six) hours as needed for pain.     Marland Kitchen insulin lispro (HUMALOG) 100 UNIT/ML injection Inject 0.24 mLs (24 Units total) into the skin 3 (three) times daily with meals. 100 mL 3  . Insulin Pen Needle (B-D ULTRAFINE III SHORT PEN) 31G X 8 MM MISC Use twice daily as directed with lantus 300 each 5  . ipratropium (ATROVENT HFA) 17 MCG/ACT inhaler Inhale 2 puffs into the lungs every 6 (six) hours as needed for wheezing. 1 Inhaler 12  . Krill Oil 300 MG CAPS Take 1 capsule by mouth daily.    Marland Kitchen LANTUS SOLOSTAR 100 UNIT/ML Solostar Pen INJECT 100 UNITS INTO THE SKIN IN MORNING AND INJECT 60 UNITS INTO THE SKIN EVERY EVENING. (Patient taking differently: INJECT 110 UNITS INTO THE SKIN IN MORNING AND INJECT 60 - 70 UNITS INTO THE SKIN EVERY EVENING.) 135 mL 0  . levothyroxine (SYNTHROID, LEVOTHROID) 88 MCG tablet Take 1 tablet (88 mcg total) by mouth daily. 30 tablet 3  . losartan (COZAAR) 50 MG tablet Take 1 tablet (50 mg total) by mouth 2 (two) times daily. 180 tablet 1  . metFORMIN (GLUCOPHAGE) 500 MG tablet TAKE 2 TABLETS BY MOUTH  TWICE DAILY 360 tablet 0  . metoprolol succinate (TOPROL-XL) 100 MG 24 hr tablet Take 1 tablet (100 mg total) by mouth daily. Take with or immediately following a meal. 90 tablet 0  . MICROLET LANCETS MISC Test twice daily as directed with microlet colored lancets Dx: DM2 obese 100 each 11  . Misc Natural Products (OSTEO BI-FLEX TRIPLE STRENGTH PO) Take 2 tablets by mouth daily.    . mometasone (ASMANEX 60 METERED DOSES) 220 MCG/INH inhaler Inhale 1 puff into the lungs 2 (two) times daily. 1 Inhaler 5  . NON FORMULARY 1 each by Other route 4 (four) times daily. Accu-check multiclix lancets- use as directed    . Probiotic Product (PROBIOTIC DAILY PO) Take by mouth.    . promethazine (PHENERGAN) 12.5 MG tablet Take 1 tablet (12.5 mg total) by mouth every 6 (six) hours as needed for nausea or vomiting. 15 tablet 0  . ranitidine (ZANTAC) 300 MG tablet TAKE 1 TABLET(300 MG) BY MOUTH AT BEDTIME 90 tablet 0  . rosuvastatin (CRESTOR) 40 MG tablet TAKE 1 TABLET(40 MG) BY MOUTH DAILY 90 tablet 0  . Syringe, Disposable, 3 ML MISC DX: E11.9/E11.65  Use bid as directed 300 each 5  . traMADol (ULTRAM) 50 MG tablet Take 1 tablet (50 mg total) by mouth every 6 (six) hours as needed for severe pain. 20 tablet 0  . famciclovir (  FAMVIR) 500 MG tablet Take 1 tablet (500 mg total) by mouth 3 (three) times daily. (Patient not taking: Reported on 07/15/2017) 21 tablet 0   No facility-administered medications prior to visit.     No Known Allergies  Review of Systems  Constitutional: Negative for fever and malaise/fatigue.  HENT: Negative for congestion.   Eyes: Negative for blurred vision.  Respiratory: Negative for shortness of breath.   Cardiovascular: Negative for chest pain, palpitations and leg swelling.  Gastrointestinal: Negative for abdominal pain, blood in stool and nausea.  Genitourinary: Negative for dysuria and frequency.  Musculoskeletal: Negative for falls.  Skin: Negative for rash.    Neurological: Negative for dizziness, loss of consciousness and headaches.  Endo/Heme/Allergies: Negative for environmental allergies.  Psychiatric/Behavioral: Negative for depression. The patient is not nervous/anxious.        Objective:    Physical Exam  Constitutional: He is oriented to person, place, and time. He appears well-developed and well-nourished. No distress.  HENT:  Head: Normocephalic and atraumatic.  Eyes: Conjunctivae are normal.  Neck: Neck supple. No thyromegaly present.  Cardiovascular: Normal rate, regular rhythm and normal heart sounds.  No murmur heard. Pulmonary/Chest: Effort normal and breath sounds normal. No respiratory distress. He has no wheezes.  Abdominal: Soft. Bowel sounds are normal. He exhibits no mass. There is no tenderness.  Musculoskeletal: He exhibits no edema.  Lymphadenopathy:    He has no cervical adenopathy.  Neurological: He is alert and oriented to person, place, and time.  Skin: Skin is warm and dry.  Psychiatric: He has a normal mood and affect. His behavior is normal.    BP (!) 168/100 (BP Location: Left Arm, Cuff Size: Large)   Pulse 72   Temp 97.6 F (36.4 C) (Oral)   Resp 20   Ht 5' 7" (1.702 m)   Wt (!) 329 lb 3.2 oz (149.3 kg)   SpO2 97%   BMI 51.56 kg/m  Wt Readings from Last 3 Encounters:  07/15/17 (!) 329 lb 3.2 oz (149.3 kg)  04/29/17 (!) 330 lb (149.7 kg)  04/13/17 (!) 326 lb 3.2 oz (148 kg)     Lab Results  Component Value Date   WBC 5.1 07/12/2017   HGB 14.4 07/12/2017   HCT 41.5 07/12/2017   PLT 174.0 07/12/2017   GLUCOSE 127 (H) 07/12/2017   CHOL 96 07/12/2017   TRIG 251.0 (H) 07/12/2017   HDL 21.20 (L) 07/12/2017   LDLDIRECT 42.0 07/12/2017   LDLCALC 18 09/08/2013   ALT 16 07/12/2017   AST 15 07/12/2017   NA 141 07/12/2017   K 4.6 07/12/2017   CL 104 07/12/2017   CREATININE 1.05 07/12/2017   BUN 15 07/12/2017   CO2 29 07/12/2017   TSH 4.61 (H) 07/12/2017   PSA 1.46 09/25/2011   INR 0.95  03/08/2014   HGBA1C 7.2 (H) 07/12/2017   MICROALBUR 0.7 09/10/2015    Lab Results  Component Value Date   TSH 4.61 (H) 07/12/2017   Lab Results  Component Value Date   WBC 5.1 07/12/2017   HGB 14.4 07/12/2017   HCT 41.5 07/12/2017   MCV 86.7 07/12/2017   PLT 174.0 07/12/2017   Lab Results  Component Value Date   NA 141 07/12/2017   K 4.6 07/12/2017   CO2 29 07/12/2017   GLUCOSE 127 (H) 07/12/2017   BUN 15 07/12/2017   CREATININE 1.05 07/12/2017   BILITOT 0.5 07/12/2017   ALKPHOS 36 (L) 07/12/2017   AST 15 07/12/2017  ALT 16 07/12/2017   PROT 6.5 07/12/2017   ALBUMIN 4.0 07/12/2017   CALCIUM 9.3 07/12/2017   ANIONGAP 5 11/19/2015   EGFR 70 (L) 01/10/2015   GFR 76.39 07/12/2017   Lab Results  Component Value Date   CHOL 96 07/12/2017   Lab Results  Component Value Date   HDL 21.20 (L) 07/12/2017   Lab Results  Component Value Date   LDLCALC 18 09/08/2013   Lab Results  Component Value Date   TRIG 251.0 (H) 07/12/2017   Lab Results  Component Value Date   CHOLHDL 5 07/12/2017   Lab Results  Component Value Date   HGBA1C 7.2 (H) 07/12/2017       Assessment & Plan:   Problem List Items Addressed This Visit    MIXED HYPERLIPIDEMIA    Encouraged heart healthy diet, increase exercise, avoid trans fats, consider a krill oil cap daily      Relevant Medications   chlorthalidone (HYGROTON) 25 MG tablet   Other Relevant Orders   Lipid panel   RESOLVED: OSA on CPAP    Uses CPAP nightly      Hypertension   Relevant Medications   chlorthalidone (HYGROTON) 25 MG tablet   Other Relevant Orders   CBC   Comprehensive metabolic panel   Diabetes mellitus type 2 in obese (HCC)    hgba1c acceptable, minimize simple carbs. Increase exercise as tolerated. Continue current meds      Relevant Orders   Hemoglobin A1c   Hypothyroid    On Levothyroxine, continue to monitor      Relevant Medications   levothyroxine (SYNTHROID, LEVOTHROID) 100 MCG tablet     Other Relevant Orders   TSH   Pedal edema   RESOLVED: Morbid obesity ([place])    Encouraged DASH diet, decrease po intake and increase exercise as tolerated. Needs 7-8 hours of sleep nightly. Avoid trans fats, eat small, frequent meals every 4-5 hours with lean proteins, complex carbs and healthy fats. Minimize simple carbs, GMO foods.       Other Visit Diagnoses    High risk medication use    -  Primary   Relevant Orders   Pain Mgmt, Profile 8 w/Conf, U (Completed)      I have discontinued Juanda Crumble E. Sian's famciclovir. I am also having him start on levothyroxine and chlorthalidone. Additionally, I am having him maintain his NON FORMULARY, cetirizine, ibuprofen, Misc Natural Products (OSTEO BI-FLEX TRIPLE STRENGTH PO), vitamin C, Krill Oil, Probiotic Product (PROBIOTIC DAILY PO), glucose blood, CONTOUR BLOOD GLUCOSE SYSTEM, promethazine, traMADol, fluticasone, CONTOUR NEXT TEST, MICROLET LANCETS, Insulin Pen Needle, Syringe (Disposable), insulin lispro, losartan, rosuvastatin, albuterol, mometasone, ipratropium, HYDROcodone-homatropine, ranitidine, metFORMIN, LANTUS SOLOSTAR, colesevelam, ALPRAZolam, fenofibrate, metoprolol succinate, and levothyroxine.  Meds ordered this encounter  Medications  . levothyroxine (SYNTHROID, LEVOTHROID) 100 MCG tablet    Sig: Take 1 tablet (100 mcg total) by mouth daily.    Dispense:  90 tablet    Refill:  1  . chlorthalidone (HYGROTON) 25 MG tablet    Sig: Take 1 tablet (25 mg total) by mouth daily.    Dispense:  30 tablet    Refill:  3     Penni Homans, MD

## 2017-07-26 ENCOUNTER — Other Ambulatory Visit: Payer: Self-pay | Admitting: Family Medicine

## 2017-07-27 ENCOUNTER — Telehealth: Payer: Self-pay | Admitting: Family Medicine

## 2017-07-27 NOTE — Telephone Encounter (Signed)
Patient made aware.

## 2017-07-27 NOTE — Telephone Encounter (Signed)
Phone call to pt. to discuss lab results of 07/12/17, to make pt. aware of need to increase his Levothyroxine to 88 mcg. daily.  While on phone, pt. Wanted to make Dr. Charlett Blake aware of having CT chest, abdomen, and pelvis scan done last week at [facility] that showed his bladder has collapsed. Asking if he should continue to take his diuretic medication?   Will send message to Dr. Charlett Blake.

## 2017-07-27 NOTE — Telephone Encounter (Signed)
As long as he is still able to urinate he can continue to urinate he can keep taking the diuretic. If he starts having trouble with urination then we would stop the medicine

## 2017-07-27 NOTE — Telephone Encounter (Signed)
Please advise 

## 2017-08-02 ENCOUNTER — Telehealth: Payer: Self-pay | Admitting: Family Medicine

## 2017-08-02 NOTE — Telephone Encounter (Signed)
Copied from [place] [id number]. Topic: Quick Communication - See Telephone Encounter >> Aug 02, 2017  9:58 AM Steven Ibarra wrote: CRM for notification. See Telephone encounter for: 08/02/17.  Patient wants to know if it is ok to take levothyroxine (SYNTHROID, LEVOTHROID) 100 MCG tablet. He said Dr Charlett Blake talked to him about the [facility] before. Please advise.  [phone]

## 2017-08-04 ENCOUNTER — Ambulatory Visit: Payer: Commercial Managed Care - PPO

## 2017-08-05 NOTE — Telephone Encounter (Signed)
Called left message for patient to call the office back 

## 2017-08-06 ENCOUNTER — Other Ambulatory Visit: Payer: Self-pay | Admitting: Family Medicine

## 2017-08-06 ENCOUNTER — Telehealth: Payer: Self-pay | Admitting: Family Medicine

## 2017-08-06 MED ORDER — CEFDINIR 300 MG PO CAPS: 300.0000 mg | ORAL_CAPSULE | Freq: Two times a day (BID) | ORAL | 0 refills | Status: AC

## 2017-08-06 NOTE — Telephone Encounter (Signed)
Copied from [place] [id number]. Topic: Inquiry >> Aug 06, 2017 10:13 AM Pricilla Handler wrote: Reason for CRM: Patient called stating that he is having cold symptoms, and wants to know if Dr. Charlett Blake would send a medication to his pharmacy. Patient saw Dr. Charlett Blake on 07/15/2017. Patient states that he has stage 4 lung cancer, and does not want his symptoms to develop into Pneumonia. Patient also wants refills of his inhalers' Albuterol (VENTOLIN HFA) 108 (90 Base) MCG/ACT inhaler, Ipratropium (ATROVENT HFA) 17 MCG/ACT inhaler, and Mometasone (ASMANEX 60 METERED DOSES) 220 MCG/INH inhaler. Patient's preferred pharmacy is [facility] [place] [place] [address] [place]. Ruthe Mannan [phone] (Phone)  [phone] (Fax).       Thank You!!!    Please advise

## 2017-08-06 NOTE — Telephone Encounter (Signed)
OK to refill requested meds and I have sent in Cefdinir to take twice daily

## 2017-08-06 NOTE — Telephone Encounter (Signed)
Spoke with patient and let him know medication was sent to his pharmacy

## 2017-08-06 NOTE — Telephone Encounter (Unsigned)
Copied from [place] [phone]. Topic: Quick Communication - Rx Refill/Question >> Aug 06, 2017  1:25 PM [name] wrote: ALPRAZolam Duanne Moron) 0.25 MG tablet  Needing refill  [facility] [place] [place] [name] [place] [address] Phone: [phone] Fax: [phone]

## 2017-08-08 ENCOUNTER — Other Ambulatory Visit: Payer: Self-pay | Admitting: Family Medicine

## 2017-08-18 ENCOUNTER — Telehealth: Payer: Self-pay | Admitting: Family Medicine

## 2017-08-18 NOTE — Telephone Encounter (Unsigned)
Copied from [place] [phone]. Topic: Quick Communication - See Telephone Encounter >> Aug 18, 2017 12:55 PM Hewitt Shorts wrote: Pt is needing to know if the blood pressure machine that was suggested he get going to be done thru a rx or does he just need to by one over the counter   Best number [phone]  [employer] [phone]

## 2017-08-20 NOTE — Telephone Encounter (Signed)
Called pt no answer left voicemail for him to return phone call

## 2017-08-27 ENCOUNTER — Telehealth: Payer: Self-pay | Admitting: Family Medicine

## 2017-08-27 ENCOUNTER — Other Ambulatory Visit: Payer: Self-pay | Admitting: Family Medicine

## 2017-08-27 DIAGNOSIS — J189 Pneumonia, unspecified organism: Secondary | ICD-10-CM

## 2017-08-27 NOTE — Telephone Encounter (Unsigned)
Copied from [place] [phone]. Topic: Quick Communication - Rx Refill/Question >> Aug 27, 2017 11:42 AM [name] wrote: Steven Ibarra  Pt is requesting a refill on these.  [facility] [place] [name] [place] [address] Phone: [phone] Fax: [phone]

## 2017-09-08 ENCOUNTER — Other Ambulatory Visit: Payer: Self-pay | Admitting: Family Medicine

## 2017-09-08 DIAGNOSIS — F419 Anxiety disorder, unspecified: Secondary | ICD-10-CM

## 2017-09-08 NOTE — Telephone Encounter (Signed)
Copied from [place] [id number]. Topic: Quick Communication - Rx Refill/Question >> Sep 08, 2017  3:51 PM Percell Belt A wrote: Medication: ALPRAZolam Duanne Moron) 0.25 MG tablet [[id number]] /  last seen 07/15/2017 Has the patient contacted their pharmacy? No  (Agent: If no, request that the patient contact the pharmacy for the refill.) (Agent: If yes, when and what did the pharmacy advise?)  Preferred Pharmacy (with phone number or street name): [facility] [place]: Please be advised that RX refills may take up to 3 business days. We ask that you follow-up with your pharmacy.

## 2017-09-09 NOTE — Telephone Encounter (Signed)
Rx refill request: Xanax 0.25 mg       Last filled: 05/18/17 # 60   LOV: 07/15/17   PCP: [place]: verified

## 2017-09-10 NOTE — Telephone Encounter (Signed)
Requesting:xanax Contract:yes UDS:low risk next screen 01/14/18 Last OV:07/15/17 Next OV:10/14/17 Last Refill:2/19/ 19  #60 1rf Database:   Please advise

## 2017-09-11 MED ORDER — ALPRAZOLAM 0.25 MG PO TABS: 0.2500 mg | ORAL_TABLET | Freq: Two times a day (BID) | ORAL | 1 refills | Status: DC | PRN

## 2017-09-24 ENCOUNTER — Other Ambulatory Visit: Payer: Self-pay | Admitting: Family Medicine

## 2017-09-27 NOTE — Telephone Encounter (Signed)
This specific albuterol not on active medication list. Routed to Dr. Charlett Blake, please advise.

## 2017-10-11 ENCOUNTER — Other Ambulatory Visit: Payer: Self-pay | Admitting: Family Medicine

## 2017-10-12 ENCOUNTER — Telehealth: Payer: Self-pay | Admitting: Family Medicine

## 2017-10-12 NOTE — Telephone Encounter (Signed)
Refill req. Humalog 100 U/ cc; last refill 07/27/17; # 90 cc; no refills  Last office visit:  07/15/17  PCP: Dr. Charlett Blake  Pharm: [facility] [address] [place]  Sending back to office due to Humalog on Medication record twice with conflicting doses.

## 2017-10-12 NOTE — Telephone Encounter (Unsigned)
Copied from [place] [id number]. Topic: Quick Communication - Rx Refill/Question >> Oct 12, 2017 10:33 AM [name] wrote: HUMALOG 100 UNIT/ML injection -  Pt is asking if he can stay on the 90 day supply Pt's last refill was only for 30 days.  [facility] [place], [address]. [name] [place] [address] Phone: [phone] Fax: [phone]

## 2017-10-13 ENCOUNTER — Other Ambulatory Visit: Payer: Self-pay

## 2017-10-13 DIAGNOSIS — G4733 Obstructive sleep apnea (adult) (pediatric): Secondary | ICD-10-CM | POA: Diagnosis not present

## 2017-10-14 ENCOUNTER — Ambulatory Visit: Payer: Commercial Managed Care - PPO | Admitting: Family Medicine

## 2017-10-15 DIAGNOSIS — C7802 Secondary malignant neoplasm of left lung: Secondary | ICD-10-CM | POA: Diagnosis not present

## 2017-10-15 DIAGNOSIS — K862 Cyst of pancreas: Secondary | ICD-10-CM | POA: Diagnosis not present

## 2017-10-15 DIAGNOSIS — J8410 Pulmonary fibrosis, unspecified: Secondary | ICD-10-CM | POA: Diagnosis not present

## 2017-10-15 DIAGNOSIS — C760 Malignant neoplasm of head, face and neck: Secondary | ICD-10-CM | POA: Diagnosis not present

## 2017-10-15 DIAGNOSIS — C7801 Secondary malignant neoplasm of right lung: Secondary | ICD-10-CM | POA: Diagnosis not present

## 2017-10-15 NOTE — Telephone Encounter (Signed)
Dr. Charlett Blake- please advise- Pt has 2 different sigs on Humalog (on med list twice). How is he supposed to take?

## 2017-10-17 ENCOUNTER — Other Ambulatory Visit: Payer: Self-pay | Admitting: Family Medicine

## 2017-10-17 MED ORDER — INSULIN LISPRO 100 UNIT/ML ~~LOC~~ SOLN: 20.0000 [IU] | Freq: Three times a day (TID) | SUBCUTANEOUS | 3 refills | Status: DC

## 2017-10-17 NOTE — Telephone Encounter (Signed)
I sent in a new prescription that says 20-24 units as directed and as needed.

## 2017-10-26 ENCOUNTER — Other Ambulatory Visit: Payer: Self-pay | Admitting: Family Medicine

## 2017-11-05 ENCOUNTER — Other Ambulatory Visit: Payer: Self-pay | Admitting: Family Medicine

## 2017-11-15 ENCOUNTER — Other Ambulatory Visit: Payer: Self-pay | Admitting: Family Medicine

## 2017-11-16 ENCOUNTER — Other Ambulatory Visit: Payer: Self-pay

## 2017-11-16 NOTE — Telephone Encounter (Signed)
Received medication refill for losartan (COZAAR) 50 MG tablet.   Last office visit: 07/15/17 Last refill: 04/13/17  Refill sent to pt's pharmacy.

## 2017-11-26 ENCOUNTER — Ambulatory Visit: Payer: Commercial Managed Care - PPO | Admitting: Family Medicine

## 2017-12-13 ENCOUNTER — Encounter: Payer: Self-pay | Admitting: Family Medicine

## 2017-12-13 ENCOUNTER — Ambulatory Visit: Payer: Commercial Managed Care - PPO | Admitting: Family Medicine

## 2017-12-13 DIAGNOSIS — E1169 Type 2 diabetes mellitus with other specified complication: Secondary | ICD-10-CM | POA: Diagnosis not present

## 2017-12-13 DIAGNOSIS — E039 Hypothyroidism, unspecified: Secondary | ICD-10-CM

## 2017-12-13 DIAGNOSIS — I10 Essential (primary) hypertension: Secondary | ICD-10-CM | POA: Diagnosis not present

## 2017-12-13 DIAGNOSIS — E782 Mixed hyperlipidemia: Secondary | ICD-10-CM

## 2017-12-13 DIAGNOSIS — E669 Obesity, unspecified: Secondary | ICD-10-CM

## 2017-12-13 LAB — COMPREHENSIVE METABOLIC PANEL
ALBUMIN: 4 g/dL (ref 3.5–5.2)
ALT: 12 U/L (ref 0–53)
AST: 11 U/L (ref 0–37)
Alkaline Phosphatase: 43 U/L (ref 39–117)
BUN: 24 mg/dL — ABNORMAL HIGH (ref 6–23)
CALCIUM: 9.5 mg/dL (ref 8.4–10.5)
CHLORIDE: 105 meq/L (ref 96–112)
CO2: 31 mEq/L (ref 19–32)
CREATININE: 1.12 mg/dL (ref 0.40–1.50)
GFR: 70.81 mL/min (ref >60.00)
Glucose, Bld: 86 mg/dL (ref 70–99)
Potassium: 4.7 mEq/L (ref 3.5–5.1)
Sodium: 140 mEq/L (ref 135–145)
Total Bilirubin: 0.5 mg/dL (ref 0.2–1.2)
Total Protein: 6.2 g/dL (ref 6.0–8.3)

## 2017-12-13 LAB — TSH: TSH: 2.26 u[IU]/mL (ref 0.35–4.50)

## 2017-12-13 LAB — CBC
HEMATOCRIT: 40 % (ref 39.0–52.0)
Hemoglobin: 14.1 g/dL (ref 13.0–17.0)
MCHC: 35.1 g/dL (ref 30.0–36.0)
MCV: 85.2 fl (ref 78.0–100.0)
PLATELETS: 187 10*3/uL (ref 150.0–400.0)
RBC: 4.7 Mil/uL (ref 4.22–5.81)
RDW: 14.6 % (ref 11.5–15.5)
WBC: 5.6 10*3/uL (ref 4.0–10.5)

## 2017-12-13 LAB — LIPID PANEL
CHOL/HDL RATIO: 5
CHOLESTEROL: 95 mg/dL (ref 0–200)
HDL: 19.1 mg/dL — AB (ref >39.00)
NONHDL: 76.26
TRIGLYCERIDES: 288 mg/dL — AB (ref 0.0–149.0)
VLDL: 57.6 mg/dL — ABNORMAL HIGH (ref 0.0–40.0)

## 2017-12-13 LAB — HEMOGLOBIN A1C: Hgb A1c MFr Bld: 7.4 % — ABNORMAL HIGH (ref 4.6–6.5)

## 2017-12-13 LAB — LDL CHOLESTEROL, DIRECT: Direct LDL: 42 mg/dL

## 2017-12-13 MED ORDER — MOMETASONE FUROATE 220 MCG/INH IN AEPB: 1.0000 | INHALATION_SPRAY | Freq: Two times a day (BID) | RESPIRATORY_TRACT | 5 refills | Status: DC

## 2017-12-13 MED ORDER — GLUCOSE BLOOD VI STRP: ORAL_STRIP | 11 refills | Status: DC

## 2017-12-13 NOTE — Patient Instructions (Addendum)
Shingrix is the new shingles shot, 2 shots over 2-6 months. Check with insurance and document  Hypertension Hypertension, commonly called high blood pressure, is when the force of blood pumping through the arteries is too strong. The arteries are the blood vessels that carry blood from the heart throughout the body. Hypertension forces the heart to work harder to pump blood and may cause arteries to become narrow or stiff. Having untreated or uncontrolled hypertension can cause heart attacks, strokes, kidney disease, and other problems. A blood pressure reading consists of a higher number over a lower number. Ideally, your blood pressure should be below 120/80. The first ("top") number is called the systolic pressure. It is a measure of the pressure in your arteries as your heart beats. The second ("bottom") number is called the diastolic pressure. It is a measure of the pressure in your arteries as the heart relaxes. What are the causes? The cause of this condition is not known. What increases the risk? Some risk factors for high blood pressure are under your control. Others are not. Factors you can change  Smoking.  Having type 2 diabetes mellitus, high cholesterol, or both.  Not getting enough exercise or physical activity.  Being overweight.  Having too much fat, sugar, calories, or salt (sodium) in your diet.  Drinking too much alcohol. Factors that are difficult or impossible to change  Having chronic kidney disease.  Having a family history of high blood pressure.  Age. Risk increases with age.  Race. You may be at higher risk if you are African-American.  Gender. Men are at higher risk than women before age 59. After age 52, women are at higher risk than men.  Having obstructive sleep apnea.  Stress. What are the signs or symptoms? Extremely high blood pressure (hypertensive crisis) may cause:  Headache.  Anxiety.  Shortness of breath.  Nosebleed.  Nausea and  vomiting.  Severe chest pain.  Jerky movements you cannot control (seizures).  How is this diagnosed? This condition is diagnosed by measuring your blood pressure while you are seated, with your arm resting on a surface. The cuff of the blood pressure monitor will be placed directly against the skin of your upper arm at the level of your heart. It should be measured at least twice using the same arm. Certain conditions can cause a difference in blood pressure between your right and left arms. Certain factors can cause blood pressure readings to be lower or higher than normal (elevated) for a short period of time:  When your blood pressure is higher when you are in a health care provider's office than when you are at home, this is called white coat hypertension. Most people with this condition do not need medicines.  When your blood pressure is higher at home than when you are in a health care provider's office, this is called masked hypertension. Most people with this condition may need medicines to control blood pressure.  If you have a high blood pressure reading during one visit or you have normal blood pressure with other risk factors:  You may be asked to return on a different day to have your blood pressure checked again.  You may be asked to monitor your blood pressure at home for 1 week or longer.  If you are diagnosed with hypertension, you may have other blood or imaging tests to help your health care provider understand your overall risk for other conditions. How is this treated? This condition is treated by  making healthy lifestyle changes, such as eating healthy foods, exercising more, and reducing your alcohol intake. Your health care provider may prescribe medicine if lifestyle changes are not enough to get your blood pressure under control, and if:  Your systolic blood pressure is above 130.  Your diastolic blood pressure is above 80.  Your personal target blood pressure  may vary depending on your medical conditions, your age, and other factors. Follow these instructions at home: Eating and drinking  Eat a diet that is high in fiber and potassium, and low in sodium, added sugar, and fat. An example eating plan is called the DASH (Dietary Approaches to Stop Hypertension) diet. To eat this way: ? Eat plenty of fresh fruits and vegetables. Try to fill half of your plate at each meal with fruits and vegetables. ? Eat whole grains, such as whole wheat pasta, brown rice, or whole grain bread. Fill about one quarter of your plate with whole grains. ? Eat or drink low-fat dairy products, such as skim milk or low-fat yogurt. ? Avoid fatty cuts of meat, processed or cured meats, and poultry with skin. Fill about one quarter of your plate with lean proteins, such as fish, chicken without skin, beans, eggs, and tofu. ? Avoid premade and processed foods. These tend to be higher in sodium, added sugar, and fat.  Reduce your daily sodium intake. Most people with hypertension should eat less than 1,500 mg of sodium a day.  Limit alcohol intake to no more than 1 drink a day for nonpregnant women and 2 drinks a day for men. One drink equals 12 oz of beer, 5 oz of wine, or 1 oz of hard liquor. Lifestyle  Work with your health care provider to maintain a healthy body weight or to lose weight. Ask what an ideal weight is for you.  Get at least 30 minutes of exercise that causes your heart to beat faster (aerobic exercise) most days of the week. Activities may include walking, swimming, or biking.  Include exercise to strengthen your muscles (resistance exercise), such as pilates or lifting weights, as part of your weekly exercise routine. Try to do these types of exercises for 30 minutes at least 3 days a week.  Do not use any products that contain nicotine or tobacco, such as cigarettes and e-cigarettes. If you need help quitting, ask your health care provider.  Monitor your  blood pressure at home as told by your health care provider.  Keep all follow-up visits as told by your health care provider. This is important. Medicines  Take over-the-counter and prescription medicines only as told by your health care provider. Follow directions carefully. Blood pressure medicines must be taken as prescribed.  Do not skip doses of blood pressure medicine. Doing this puts you at risk for problems and can make the medicine less effective.  Ask your health care provider about side effects or reactions to medicines that you should watch for. Contact a health care provider if:  You think you are having a reaction to a medicine you are taking.  You have headaches that keep coming back (recurring).  You feel dizzy.  You have swelling in your ankles.  You have trouble with your vision. Get help right away if:  You develop a severe headache or confusion.  You have unusual weakness or numbness.  You feel faint.  You have severe pain in your chest or abdomen.  You vomit repeatedly.  You have trouble breathing. Summary  Hypertension is  when the force of blood pumping through your arteries is too strong. If this condition is not controlled, it may put you at risk for serious complications.  Your personal target blood pressure may vary depending on your medical conditions, your age, and other factors. For most people, a normal blood pressure is less than 120/80.  Hypertension is treated with lifestyle changes, medicines, or a combination of both. Lifestyle changes include weight loss, eating a healthy, low-sodium diet, exercising more, and limiting alcohol. This information is not intended to replace advice given to you by your health care provider. Make sure you discuss any questions you have with your health care provider. Document Released: 03/16/2005 Document Revised: 02/12/2016 Document Reviewed: 02/12/2016 Elsevier Interactive Patient Education  [employer].

## 2017-12-13 NOTE — Assessment & Plan Note (Signed)
On Levothyroxine, continue to monitor 

## 2017-12-13 NOTE — Assessment & Plan Note (Signed)
hgba1c acceptable, minimize simple carbs. Increase exercise as tolerated. Continue current meds 

## 2017-12-13 NOTE — Assessment & Plan Note (Signed)
Well controlled, no changes to meds. Encouraged heart healthy diet such as the DASH diet and exercise as tolerated.  °

## 2017-12-13 NOTE — Assessment & Plan Note (Signed)
Encouraged heart healthy diet, increase exercise, avoid trans fats, consider a krill oil cap daily 

## 2017-12-13 NOTE — Progress Notes (Signed)
Subjective:  CMA served as scribe  Patient ID: Steven Ibarra, male    DOB: 10-Jan-1956, 62 y.o.   MRN: [id number]  No chief complaint on file.   HPI  Patient is in today for follow up. No recent febrile illness or hospitalizations. No polyuria or polydipsia. He is trying to maintain heart healthy diet and stay active. No acute concerns. Denies CP/palp/SOB/HA/congestion/fevers/GI or GU c/o. Taking meds as prescribed  Patient Care Team: Mosie Lukes, MD as PCP - General   Past Medical History:  Diagnosis Date  . ALLERGIC RHINITIS, SEASONAL 10/23/2009  . Anxiety 09/08/2016  . Basal cell carcinoma of neck 05/28/2011   r suprahyoid, radical neck dissection S/p 6 weeks of targeted radiation therapy   . Cancer ([facility])   . CAP (community acquired pneumonia) 03/02/2016  . Costochondritis 09/16/2015  . Diabetes mellitus approx 2003   type 2  . DIABETES MELLITUS, TYPE II 10/23/2009  . DYSPNEA ON EXERTION 10/23/2009  . ELEVATED BLOOD PRESSURE 04/23/2010  . GERD (gastroesophageal reflux disease)   . Hearing loss   . History of radiation therapy 07/06/2011- 08/19/2011   33 Fractions to Right facial nodes through the right neck and right trigeminal nerve  . History of radiation therapy 05/23/14, 05/25/14, 05/28/14, 05/30/14, 06/01/14   SBRT Right upper lullng mass 54 Gy in 3 fractions, Left lower lung mass and adjacent nodules 50 Gy in 5 fractions  . History of radiation therapy 02/11/2015- 02/20/15   Right lung superior upper lobe 50 Gy in 5 fractions, Left Lung posterior lower lobe 50 Gy in 5 fractions  . Hyperlipidemia   . Hypertension    Does not see a cardiologist, has not had a stress, echo   . Hypothyroid 01/28/2012  . Lung cancer ([facility]) 03/08/14   invasive squamous cell carcinoma  . Mixed hyperlipidemia 10/23/2009  . Obesity   . OSA (obstructive sleep apnea)   . Otitis externa of right ear 07/06/2012  . Overweight(278.02) 10/23/2009  . Pedal edema 09/28/2016  . Preventative health care 09/30/2011    . Pulled muscle 07/06/12  . Right calf pain 09/08/2016  . S/P radiation therapy 07/06/11 - 08/19/11   Right Facial and Right Neck Nodes and right Trigeminal  Nerve to Base of Skull/ Total Dose 6600 cGy/ 33 Fractions  . Skin cancer    on nose, 9-10 yrs ago  . SLEEP APNEA, OBSTRUCTIVE 10/23/2009   Sleep study Done at [facility]  . SOB (shortness of breath) 09/16/2015  . Tinea pedis of both feet 03/02/2016  . Type II or unspecified type diabetes mellitus with unspecified complication, uncontrolled    type 2     Past Surgical History:  Procedure Laterality Date  . LUNG BIOPSY Right 03/08/2014  . RADICAL NECK DISSECTION  05/28/2011   Procedure: RADICAL NECK DISSECTION;  Surgeon: Melissa Montane, MD;  Location: [facility];  Service: ENT;  Laterality: N/A;  Suprahyoid Neck Dissection  . skin cancer removal    . TONSILLECTOMY AND ADENOIDECTOMY      Family History  Problem Relation Age of Onset  . Other Mother        CHF  . Stroke Father   . Hyperlipidemia Father   . Heart disease Father        s/p valve replacement  . Hyperlipidemia Brother   . Obesity Brother   . Other Brother        Back pain  . Hyperlipidemia Brother   . Hypertension Brother   .  Other Brother        Panic attacks  . Hyperlipidemia Brother   . Anesthesia problems Neg Hx     Social History   Socioeconomic History  . Marital status: Married    Spouse name: Not on file  . Number of children: Not on file  . Years of education: Not on file  . Highest education level: Not on file  Occupational History  . Not on file  Social Needs  . Financial resource strain: Not on file  . Food insecurity:    Worry: Not on file    Inability: Not on file  . Transportation needs:    Medical: Not on file    Non-medical: Not on file  Tobacco Use  . Smoking status: Former Smoker    Packs/day: 1.00    Years: 9.00    Pack years: 9.00    Types: Cigarettes    Last attempt to quit: 03/31/1975    Years since quitting: 42.7  . Smokeless  tobacco: Never Used  Substance and Sexual Activity  . Alcohol use: No    Alcohol/week: 0.0 standard drinks    Comment: 4 beers a month, 06/12/11 rarely uses now  . Drug use: No  . Sexual activity: Yes  Lifestyle  . Physical activity:    Days per week: Not on file    Minutes per session: Not on file  . Stress: Not on file  Relationships  . Social connections:    Talks on phone: Not on file    Gets together: Not on file    Attends religious service: Not on file    Active member of club or organization: Not on file    Attends meetings of clubs or organizations: Not on file    Relationship status: Not on file  . Intimate partner violence:    Fear of current or ex partner: Not on file    Emotionally abused: Not on file    Physically abused: Not on file    Forced sexual activity: Not on file  Other Topics Concern  . Not on file  Social History Narrative   Patient is married.   Patient with a history of smoking one pack per day for approximately 29 years from ages of [age]. Patient denies ever having used smokeless tobacco. Patient with rare use of alcohol.      Mother died at age 21 from congestive heart failure complications. Father died at the age of 52 secondary to a stroke.    Outpatient Medications Prior to Visit  Medication Sig Dispense Refill  . albuterol (PROVENTIL HFA;VENTOLIN HFA) 108 (90 Base) MCG/ACT inhaler INHALE 2 PUFFS INTO THE LUNGS EVERY 6 HOURS AS NEEDED FOR WHEEZING OR SHORTNESS OF BREATH 54 g 0  . albuterol (VENTOLIN HFA) 108 (90 Base) MCG/ACT inhaler Inhale 2 puffs into the lungs every 6 (six) hours as needed for wheezing or shortness of breath. 3 Inhaler 3  . ALPRAZolam (XANAX) 0.25 MG tablet Take 1 tablet (0.25 mg total) by mouth 2 (two) times daily as needed. for anxiety 60 tablet 1  . Ascorbic Acid (VITAMIN C) 1000 MG tablet Take 1,000 mg by mouth daily.    . benzonatate (TESSALON) 100 MG capsule TAKE 1 CAPSULE(100 MG) BY MOUTH THREE TIMES DAILY AS NEEDED 40  capsule 0  . Blood Glucose Monitoring Suppl (CONTOUR BLOOD GLUCOSE SYSTEM) DEVI Use twice daily to check blood sugar.  DX E11.9 1 Device 0  . cetirizine (ZYRTEC) 10 MG tablet Take  10 mg by mouth daily as needed for allergies.     . chlorthalidone (HYGROTON) 25 MG tablet Take 1 tablet (25 mg total) by mouth daily. 30 tablet 3  . colesevelam (WELCHOL) 625 MG tablet Take 3 tablets (1,875 mg total) by mouth 2 (two) times daily with a meal. 180 tablet 3  . fenofibrate 160 MG tablet Take 1 tablet (160 mg total) by mouth daily. 90 tablet 1  . fluticasone (FLONASE) 50 MCG/ACT nasal spray Place 2 sprays into both nostrils daily. 16 g 6  . HYDROcodone-homatropine (HYCODAN) 5-1.5 MG/5ML syrup Take 5 mLs by mouth every 8 (eight) hours as needed. 100 mL 0  . ibuprofen (ADVIL,MOTRIN) 200 MG tablet Take 600 mg by mouth every 6 (six) hours as needed for pain.     Marland Kitchen insulin lispro (HUMALOG) 100 UNIT/ML injection Inject 0.2-0.24 mLs (20-24 Units total) into the skin 3 (three) times daily with meals. As directed and as needed 100 mL 3  . Insulin Pen Needle (B-D ULTRAFINE III SHORT PEN) 31G X 8 MM MISC Use twice daily as directed with lantus 300 each 5  . ipratropium (ATROVENT HFA) 17 MCG/ACT inhaler Inhale 2 puffs into the lungs every 6 (six) hours as needed for wheezing. 1 Inhaler 12  . Krill Oil 300 MG CAPS Take 1 capsule by mouth daily.    Marland Kitchen LANTUS SOLOSTAR 100 UNIT/ML Solostar Pen INJECT 100 UNITS INTO THE SKIN IN MORNING AND INJECT 60 UNITS INTO THE SKIN EVERY EVENING. 135 mL 0  . levothyroxine (SYNTHROID, LEVOTHROID) 100 MCG tablet Take 1 tablet (100 mcg total) by mouth daily. 90 tablet 1  . levothyroxine (SYNTHROID, LEVOTHROID) 88 MCG tablet Take 1 tablet (88 mcg total) by mouth daily. 30 tablet 3  . losartan (COZAAR) 50 MG tablet TAKE 1 TABLET(50 MG) BY MOUTH TWICE DAILY 180 tablet 0  . metFORMIN (GLUCOPHAGE) 500 MG tablet Take 2 tablets (1,000 mg total) by mouth 2 (two) times daily. 360 tablet 0  .  metoprolol succinate (TOPROL-XL) 100 MG 24 hr tablet TAKE 1 TABLET BY MOUTH DAILY WITH OR IMMEDIATELY FOLLOWING A MEAL 90 tablet 0  . MICROLET LANCETS MISC Test twice daily as directed with microlet colored lancets Dx: DM2 obese 100 each 11  . Misc Natural Products (OSTEO BI-FLEX TRIPLE STRENGTH PO) Take 2 tablets by mouth daily.    . NON FORMULARY 1 each by Other route 4 (four) times daily. Accu-check multiclix lancets- use as directed    . Probiotic Product (PROBIOTIC DAILY PO) Take by mouth.    . promethazine (PHENERGAN) 12.5 MG tablet Take 1 tablet (12.5 mg total) by mouth every 6 (six) hours as needed for nausea or vomiting. 15 tablet 0  . ranitidine (ZANTAC) 300 MG tablet Take 1 tablet (300 mg total) by mouth at bedtime. 90 tablet 0  . rosuvastatin (CRESTOR) 40 MG tablet TAKE 1 TABLET(40 MG) BY MOUTH DAILY 90 tablet 0  . Syringe, Disposable, 3 ML MISC DX: E11.9/E11.65  Use bid as directed 300 each 5  . traMADol (ULTRAM) 50 MG tablet Take 1 tablet (50 mg total) by mouth every 6 (six) hours as needed for severe pain. 20 tablet 0  . CONTOUR NEXT TEST test strip USE AS DIRECTED TWICE DAILY 100 each 11  . glucose blood (BAYER CONTOUR TEST) test strip Use twice daily to check blood sugar.  DX E11.9 BAYER CONTOUR NEXT EASY PER INSURANCE 100 each 6  . mometasone (ASMANEX 60 METERED DOSES) 220 MCG/INH inhaler Inhale  1 puff into the lungs 2 (two) times daily. 1 Inhaler 5   No facility-administered medications prior to visit.     No Known Allergies  Review of Systems  Constitutional: Negative for fever and malaise/fatigue.  HENT: Negative for congestion.   Eyes: Negative for blurred vision.  Respiratory: Negative for shortness of breath.   Cardiovascular: Negative for chest pain, palpitations and leg swelling.  Gastrointestinal: Negative for abdominal pain, blood in stool and nausea.  Genitourinary: Negative for dysuria and frequency.  Musculoskeletal: Positive for neck pain. Negative for  falls.  Skin: Negative for rash.  Neurological: Negative for dizziness, loss of consciousness and headaches.  Endo/Heme/Allergies: Negative for environmental allergies.  Psychiatric/Behavioral: Negative for depression. The patient is not nervous/anxious.        Objective:    Physical Exam  Constitutional: He is oriented to person, place, and time. He appears well-developed and well-nourished. No distress.  HENT:  Head: Normocephalic and atraumatic.  Nose: Nose normal.  Eyes: Right eye exhibits no discharge. Left eye exhibits no discharge.  Neck: Normal range of motion. Neck supple.  Cardiovascular: Normal rate and regular rhythm.  No murmur heard. Pulmonary/Chest: Effort normal and breath sounds normal.  Abdominal: Soft. Bowel sounds are normal. There is no tenderness.  Musculoskeletal: He exhibits no edema.  Neurological: He is alert and oriented to person, place, and time.  Skin: Skin is warm and dry.  Psychiatric: He has a normal mood and affect.  Nursing note and vitals reviewed.   BP 126/80 (BP Location: Left Arm, Patient Position: Sitting, Cuff Size: Normal)   Pulse 75   Temp 97.6 F (36.4 C) (Oral)   Resp 18   Ht '6\' 6"'  (1.981 m)   Wt (!) 329 lb (149.2 kg)   SpO2 97%   BMI 38.02 kg/m  Wt Readings from Last 3 Encounters:  12/13/17 (!) 329 lb (149.2 kg)  07/15/17 (!) 329 lb 3.2 oz (149.3 kg)  04/29/17 (!) 330 lb (149.7 kg)   BP Readings from Last 3 Encounters:  12/13/17 126/80  07/15/17 (!) 168/100  06/16/17 (!) 165/94     Immunization History  Administered Date(s) Administered  . Influenza Split 01/28/2012  . Influenza Whole 02/11/2010, 01/29/2011  . Influenza,inj,Quad PF,6+ Mos 01/19/2013, 12/28/2013, 02/13/2015, 01/22/2016, 12/29/2016  . Pneumococcal Conjugate-13 01/19/2013, 03/04/2015  . Pneumococcal Polysaccharide-23 11/19/2015  . Tdap 05/29/2010    Health Maintenance  Topic Date Due  . OPHTHALMOLOGY EXAM  09/04/2015  . INFLUENZA VACCINE   10/28/2017  . HEMOGLOBIN A1C  01/11/2018  . FOOT EXAM  07/16/2018  . TETANUS/TDAP  05/28/2020  . COLONOSCOPY  03/18/2022  . PNEUMOCOCCAL POLYSACCHARIDE VACCINE AGE 51-64 HIGH RISK  Completed  . Hepatitis C Screening  Completed  . HIV Screening  Completed    Lab Results  Component Value Date   WBC 5.1 07/12/2017   HGB 14.4 07/12/2017   HCT 41.5 07/12/2017   PLT 174.0 07/12/2017   GLUCOSE 127 (H) 07/12/2017   CHOL 96 07/12/2017   TRIG 251.0 (H) 07/12/2017   HDL 21.20 (L) 07/12/2017   LDLDIRECT 42.0 07/12/2017   LDLCALC 18 09/08/2013   ALT 16 07/12/2017   AST 15 07/12/2017   NA 141 07/12/2017   K 4.6 07/12/2017   CL 104 07/12/2017   CREATININE 1.05 07/12/2017   BUN 15 07/12/2017   CO2 29 07/12/2017   TSH 4.61 (H) 07/12/2017   PSA 1.46 09/25/2011   INR 0.95 03/08/2014   HGBA1C 7.2 (H) 07/12/2017  MICROALBUR 0.7 09/10/2015    Lab Results  Component Value Date   TSH 4.61 (H) 07/12/2017   Lab Results  Component Value Date   WBC 5.1 07/12/2017   HGB 14.4 07/12/2017   HCT 41.5 07/12/2017   MCV 86.7 07/12/2017   PLT 174.0 07/12/2017   Lab Results  Component Value Date   NA 141 07/12/2017   K 4.6 07/12/2017   CO2 29 07/12/2017   GLUCOSE 127 (H) 07/12/2017   BUN 15 07/12/2017   CREATININE 1.05 07/12/2017   BILITOT 0.5 07/12/2017   ALKPHOS 36 (L) 07/12/2017   AST 15 07/12/2017   ALT 16 07/12/2017   PROT 6.5 07/12/2017   ALBUMIN 4.0 07/12/2017   CALCIUM 9.3 07/12/2017   ANIONGAP 5 11/19/2015   EGFR 70 (L) 01/10/2015   GFR 76.39 07/12/2017   Lab Results  Component Value Date   CHOL 96 07/12/2017   Lab Results  Component Value Date   HDL 21.20 (L) 07/12/2017   Lab Results  Component Value Date   LDLCALC 18 09/08/2013   Lab Results  Component Value Date   TRIG 251.0 (H) 07/12/2017   Lab Results  Component Value Date   CHOLHDL 5 07/12/2017   Lab Results  Component Value Date   HGBA1C 7.2 (H) 07/12/2017         Assessment & Plan:   Problem  List Items Addressed This Visit    MIXED HYPERLIPIDEMIA    Encouraged heart healthy diet, increase exercise, avoid trans fats, consider a krill oil cap daily      Relevant Orders   Lipid panel   Hypertension    Well controlled, no changes to meds. Encouraged heart healthy diet such as the DASH diet and exercise as tolerated.       Relevant Orders   CBC   Comprehensive metabolic panel   Diabetes mellitus type 2 in obese (HCC)    hgba1c acceptable, minimize simple carbs. Increase exercise as tolerated. Continue current meds      Relevant Orders   Hemoglobin A1c   Hypothyroid    On Levothyroxine, continue to monitor      Relevant Orders   TSH      I have discontinued Juanda Crumble E. Melroy's glucose blood. I have changed his CONTOUR NEXT TEST to glucose blood. I have also changed his mometasone. Additionally, I am having him maintain his NON FORMULARY, cetirizine, ibuprofen, Misc Natural Products (OSTEO BI-FLEX TRIPLE STRENGTH PO), vitamin C, Krill Oil, Probiotic Product (PROBIOTIC DAILY PO), CONTOUR BLOOD GLUCOSE SYSTEM, promethazine, traMADol, fluticasone, MICROLET LANCETS, Insulin Pen Needle, Syringe (Disposable), albuterol, ipratropium, HYDROcodone-homatropine, colesevelam, fenofibrate, levothyroxine, levothyroxine, chlorthalidone, benzonatate, ALPRAZolam, albuterol, LANTUS SOLOSTAR, insulin lispro, metoprolol succinate, rosuvastatin, ranitidine, metFORMIN, and losartan.  Meds ordered this encounter  Medications  . glucose blood (CONTOUR NEXT TEST) test strip    Sig: USE AS DIRECTED TWICE DAILY    Dispense:  100 each    Refill:  11  . mometasone (ASMANEX, 60 METERED DOSES,) 220 MCG/INH inhaler    Sig: Inhale 1 puff into the lungs 2 (two) times daily.    Dispense:  1 Inhaler    Refill:  5    CMA served as scribe during this visit. History, Physical and Plan performed by medical provider. Documentation and orders reviewed and attested to.  Penni Homans, MD

## 2017-12-14 ENCOUNTER — Other Ambulatory Visit: Payer: Self-pay | Admitting: Family Medicine

## 2017-12-14 MED ORDER — COLESEVELAM HCL 625 MG PO TABS: 1875.0000 mg | ORAL_TABLET | Freq: Two times a day (BID) | ORAL | 3 refills | Status: DC

## 2018-01-07 ENCOUNTER — Other Ambulatory Visit: Payer: Self-pay | Admitting: Family Medicine

## 2018-01-14 DIAGNOSIS — C7800 Secondary malignant neoplasm of unspecified lung: Secondary | ICD-10-CM | POA: Diagnosis not present

## 2018-01-14 DIAGNOSIS — C7802 Secondary malignant neoplasm of left lung: Secondary | ICD-10-CM | POA: Diagnosis not present

## 2018-01-14 DIAGNOSIS — Z8589 Personal history of malignant neoplasm of other organs and systems: Secondary | ICD-10-CM | POA: Diagnosis not present

## 2018-01-14 DIAGNOSIS — C779 Secondary and unspecified malignant neoplasm of lymph node, unspecified: Secondary | ICD-10-CM | POA: Diagnosis not present

## 2018-01-14 DIAGNOSIS — J90 Pleural effusion, not elsewhere classified: Secondary | ICD-10-CM | POA: Diagnosis not present

## 2018-01-14 DIAGNOSIS — J701 Chronic and other pulmonary manifestations due to radiation: Secondary | ICD-10-CM | POA: Diagnosis not present

## 2018-01-14 DIAGNOSIS — Z85118 Personal history of other malignant neoplasm of bronchus and lung: Secondary | ICD-10-CM | POA: Diagnosis not present

## 2018-01-17 ENCOUNTER — Ambulatory Visit: Payer: Commercial Managed Care - PPO

## 2018-01-19 ENCOUNTER — Other Ambulatory Visit: Payer: Self-pay | Admitting: Family Medicine

## 2018-01-19 ENCOUNTER — Other Ambulatory Visit: Payer: Self-pay

## 2018-01-22 ENCOUNTER — Other Ambulatory Visit: Payer: Self-pay | Admitting: Family Medicine

## 2018-01-28 ENCOUNTER — Other Ambulatory Visit: Payer: Self-pay | Admitting: Family Medicine

## 2018-02-08 ENCOUNTER — Other Ambulatory Visit: Payer: Self-pay | Admitting: Family Medicine

## 2018-02-23 ENCOUNTER — Other Ambulatory Visit: Payer: Self-pay | Admitting: Family Medicine

## 2018-02-28 NOTE — Telephone Encounter (Signed)
Zantac not available- please advise alternative.

## 2018-03-01 ENCOUNTER — Other Ambulatory Visit: Payer: Self-pay | Admitting: Family Medicine

## 2018-03-01 MED ORDER — FAMOTIDINE 40 MG PO TABS: 40.0000 mg | ORAL_TABLET | Freq: Every day | ORAL | 1 refills | Status: DC

## 2018-03-18 DIAGNOSIS — J701 Chronic and other pulmonary manifestations due to radiation: Secondary | ICD-10-CM | POA: Diagnosis not present

## 2018-03-18 DIAGNOSIS — C7800 Secondary malignant neoplasm of unspecified lung: Secondary | ICD-10-CM | POA: Diagnosis not present

## 2018-03-18 DIAGNOSIS — J948 Other specified pleural conditions: Secondary | ICD-10-CM | POA: Diagnosis not present

## 2018-03-18 DIAGNOSIS — C7801 Secondary malignant neoplasm of right lung: Secondary | ICD-10-CM | POA: Diagnosis not present

## 2018-03-18 DIAGNOSIS — C969 Malignant neoplasm of lymphoid, hematopoietic and related tissue, unspecified: Secondary | ICD-10-CM | POA: Diagnosis not present

## 2018-03-18 DIAGNOSIS — E278 Other specified disorders of adrenal gland: Secondary | ICD-10-CM | POA: Diagnosis not present

## 2018-03-18 DIAGNOSIS — C779 Secondary and unspecified malignant neoplasm of lymph node, unspecified: Secondary | ICD-10-CM | POA: Diagnosis not present

## 2018-03-18 DIAGNOSIS — K8689 Other specified diseases of pancreas: Secondary | ICD-10-CM | POA: Diagnosis not present

## 2018-04-05 ENCOUNTER — Other Ambulatory Visit: Payer: Self-pay | Admitting: Family Medicine

## 2018-04-20 DIAGNOSIS — C779 Secondary and unspecified malignant neoplasm of lymph node, unspecified: Secondary | ICD-10-CM | POA: Diagnosis not present

## 2018-04-20 DIAGNOSIS — R918 Other nonspecific abnormal finding of lung field: Secondary | ICD-10-CM | POA: Diagnosis not present

## 2018-04-20 DIAGNOSIS — J90 Pleural effusion, not elsewhere classified: Secondary | ICD-10-CM | POA: Diagnosis not present

## 2018-04-20 DIAGNOSIS — J701 Chronic and other pulmonary manifestations due to radiation: Secondary | ICD-10-CM | POA: Diagnosis not present

## 2018-04-20 DIAGNOSIS — C7800 Secondary malignant neoplasm of unspecified lung: Secondary | ICD-10-CM | POA: Diagnosis not present

## 2018-04-27 DIAGNOSIS — C7800 Secondary malignant neoplasm of unspecified lung: Secondary | ICD-10-CM | POA: Diagnosis not present

## 2018-04-27 DIAGNOSIS — J90 Pleural effusion, not elsewhere classified: Secondary | ICD-10-CM | POA: Diagnosis not present

## 2018-04-30 ENCOUNTER — Other Ambulatory Visit: Payer: Self-pay | Admitting: Family Medicine

## 2018-05-05 ENCOUNTER — Other Ambulatory Visit: Payer: Self-pay | Admitting: Family Medicine

## 2018-05-09 ENCOUNTER — Other Ambulatory Visit: Payer: Self-pay | Admitting: Family Medicine

## 2018-05-17 ENCOUNTER — Other Ambulatory Visit: Payer: Self-pay | Admitting: Family Medicine

## 2018-05-19 ENCOUNTER — Other Ambulatory Visit: Payer: Self-pay | Admitting: Medical

## 2018-05-20 DIAGNOSIS — C7800 Secondary malignant neoplasm of unspecified lung: Secondary | ICD-10-CM | POA: Diagnosis not present

## 2018-05-20 DIAGNOSIS — J90 Pleural effusion, not elsewhere classified: Secondary | ICD-10-CM | POA: Diagnosis not present

## 2018-05-20 DIAGNOSIS — C799 Secondary malignant neoplasm of unspecified site: Secondary | ICD-10-CM | POA: Diagnosis not present

## 2018-05-20 DIAGNOSIS — C3490 Malignant neoplasm of unspecified part of unspecified bronchus or lung: Secondary | ICD-10-CM | POA: Diagnosis not present

## 2018-06-10 ENCOUNTER — Other Ambulatory Visit: Payer: Self-pay | Admitting: Family Medicine

## 2018-06-10 DIAGNOSIS — F419 Anxiety disorder, unspecified: Secondary | ICD-10-CM

## 2018-06-10 MED ORDER — MICROLET LANCETS MISC: 11 refills | Status: DC

## 2018-06-10 MED ORDER — GLUCOSE BLOOD VI STRP: ORAL_STRIP | 11 refills | Status: DC

## 2018-06-10 NOTE — Telephone Encounter (Signed)
Copied from [place] [id number]. Topic: Quick Communication - Rx Refill/Question >> Jun 10, 2018  2:08 PM Claudia Desanctis M wrote: Medication: ALPRAZolam Duanne Moron) 0.25 MG tablet and diabetic supply (strips and lancets) / patient last seen 12/13/2017 and as per AVS follow up in 6 months, patient scheduled for 09/05/2018 with PCP   Preferred Pharmacy (with phone number or street name):  [facility], [place]. [place] [phone] (Phone) [phone] (Fax)  Agent: Please be advised that RX refills may take up to 3 business days. We ask that you follow-up with your pharmacy.

## 2018-06-10 NOTE — Telephone Encounter (Signed)
Requested medication (s) are due for refill today: yes  Requested medication (s) are on the active medication list: yes  Last refill:  09/11/17  Future visit scheduled: yes  Notes to clinic:  Medication not delegated to NT to refill   Requested Prescriptions  Pending Prescriptions Disp Refills   ALPRAZolam (XANAX) 0.25 MG tablet 60 tablet     Sig: Take 1 tablet (0.25 mg total) by mouth 2 (two) times daily as needed. for anxiety     Not Delegated - Psychiatry:  Anxiolytics/Hypnotics Failed - 06/10/2018  2:28 PM      Failed - This refill cannot be delegated      Passed - Urine Drug Screen completed in last 360 days.      Passed - Valid encounter within last 6 months    Recent Outpatient Visits          5 months ago Mixed hyperlipidemia   [facility] [place], [place]   11 months ago High risk medication use   [facility] [place], [place]   11 months ago Essential hypertension   [facility] [place], [place]   11 months ago Essential hypertension   [facility] [place], [place]   1 year ago Essential hypertension   [facility] at [place], [place]      Future Appointments            In 2 months Mosie Lukes, MD [place] at [facility], [place]   glucose blood (CONTOUR NEXT TEST) test strip 100 each 11    Sig: USE AS DIRECTED TWICE DAILY     Endocrinology: Diabetes - Testing Supplies Passed - 06/10/2018  2:28 PM      Passed - Valid encounter within last 12 months    Recent Outpatient Visits          5 months ago Mixed hyperlipidemia   [facility] [place], [place]   11 months ago High risk medication use   [facility] at  [place], [place]   11 months ago Essential hypertension   [facility] at [place], [place]   11 months ago Essential hypertension   [facility] at [place], [place]   1 year ago Essential hypertension   [facility] at [place], [place]      Future Appointments            In 2 months Mosie Lukes, MD [place] at [facility], [place] 100 each 11    Sig: USE TO TEST [place] MD     Endocrinology: Diabetes - Testing Supplies Passed - 06/10/2018  2:28 PM      Passed - Valid encounter within last 12 months    Recent Outpatient Visits          5 months ago Mixed hyperlipidemia   [facility] [place], [place]   11 months ago  High risk medication use   [facility] at [place], [place]   11 months ago Essential hypertension   [facility] at [place], [place]   11 months ago Essential hypertension   [facility] at [place], [place]   1 year ago Essential hypertension   [facility] [place], [place]      Future Appointments            In 2 months Mosie Lukes, MD [facility] at [place]

## 2018-06-12 MED ORDER — ALPRAZOLAM 0.25 MG PO TABS: 0.2500 mg | ORAL_TABLET | Freq: Two times a day (BID) | ORAL | 2 refills | Status: DC | PRN

## 2018-06-14 ENCOUNTER — Encounter: Payer: Commercial Managed Care - PPO | Admitting: Family Medicine

## 2018-07-11 ENCOUNTER — Other Ambulatory Visit: Payer: Self-pay | Admitting: Family Medicine

## 2018-07-22 DIAGNOSIS — C760 Malignant neoplasm of head, face and neck: Secondary | ICD-10-CM | POA: Diagnosis not present

## 2018-07-22 DIAGNOSIS — M8458XA Pathological fracture in neoplastic disease, other specified site, initial encounter for fracture: Secondary | ICD-10-CM | POA: Diagnosis not present

## 2018-07-22 DIAGNOSIS — J90 Pleural effusion, not elsewhere classified: Secondary | ICD-10-CM | POA: Diagnosis not present

## 2018-07-22 DIAGNOSIS — C7802 Secondary malignant neoplasm of left lung: Secondary | ICD-10-CM | POA: Diagnosis not present

## 2018-07-22 DIAGNOSIS — C7801 Secondary malignant neoplasm of right lung: Secondary | ICD-10-CM | POA: Diagnosis not present

## 2018-07-29 ENCOUNTER — Other Ambulatory Visit: Payer: Self-pay | Admitting: Family Medicine

## 2018-08-02 ENCOUNTER — Other Ambulatory Visit: Payer: Self-pay | Admitting: Family Medicine

## 2018-08-10 ENCOUNTER — Other Ambulatory Visit: Payer: Self-pay | Admitting: Family Medicine

## 2018-08-11 DIAGNOSIS — J90 Pleural effusion, not elsewhere classified: Secondary | ICD-10-CM | POA: Diagnosis not present

## 2018-08-11 DIAGNOSIS — R222 Localized swelling, mass and lump, trunk: Secondary | ICD-10-CM | POA: Diagnosis not present

## 2018-08-13 ENCOUNTER — Other Ambulatory Visit: Payer: Self-pay | Admitting: Family Medicine

## 2018-09-05 ENCOUNTER — Encounter: Payer: Commercial Managed Care - PPO | Admitting: Family Medicine

## 2018-09-06 ENCOUNTER — Other Ambulatory Visit: Payer: Self-pay

## 2018-09-06 ENCOUNTER — Telehealth: Payer: Self-pay | Admitting: Family Medicine

## 2018-09-06 ENCOUNTER — Ambulatory Visit (INDEPENDENT_AMBULATORY_CARE_PROVIDER_SITE_OTHER): Payer: Commercial Managed Care - PPO | Admitting: Family Medicine

## 2018-09-06 VITALS — BP 140/82

## 2018-09-06 DIAGNOSIS — I10 Essential (primary) hypertension: Secondary | ICD-10-CM | POA: Diagnosis not present

## 2018-09-06 DIAGNOSIS — C4441 Basal cell carcinoma of skin of scalp and neck: Secondary | ICD-10-CM

## 2018-09-06 DIAGNOSIS — E669 Obesity, unspecified: Secondary | ICD-10-CM

## 2018-09-06 DIAGNOSIS — E782 Mixed hyperlipidemia: Secondary | ICD-10-CM

## 2018-09-06 DIAGNOSIS — E1169 Type 2 diabetes mellitus with other specified complication: Secondary | ICD-10-CM

## 2018-09-06 NOTE — Telephone Encounter (Signed)
LVM for patient to call back to schedule " Return in about 3 months (around 12/07/2018), or 3 mn f/u lab appt late july early morning." appointments per dr. Charlett Blake.

## 2018-09-09 ENCOUNTER — Other Ambulatory Visit: Payer: Self-pay | Admitting: Family Medicine

## 2018-09-11 NOTE — Assessment & Plan Note (Signed)
Continues to follow with oncology and has further imaging scheduled soon. Feels well today

## 2018-09-11 NOTE — Assessment & Plan Note (Signed)
Well controlled, no changes to meds. Encouraged heart healthy diet such as the DASH diet and exercise as tolerated.  °

## 2018-09-11 NOTE — Progress Notes (Signed)
Virtual Visit via Video Note  I connected with Steven Ibarra on 09/06/18 at  8:40 AM EDT by a video enabled telemedicine application and verified that I am speaking with the correct person using two identifiers.  Location: Patient: home Provider: home   I discussed the limitations of evaluation and management by telemedicine and the availability of in person appointments. The patient expressed understanding and agreed to proceed.    Subjective:    Patient ID: Steven Ibarra, male    DOB: 28-Aug-1955, 63 y.o.   MRN: [id number]  No chief complaint on file.   HPI Patient is in today for follow up on chronic medical concerns including hypertension, hyperlipidemia, diabetes and more. He continues to follow with oncology regarding his cancer and has some repeat imaging soon. He feels well and denies any recent febrile illness or hospitalizations. No polyuria or polydipsia. Denies CP/palp/SOB/HA/congestion/fevers/GI or GU c/o. Taking meds as prescribed  Past Medical History:  Diagnosis Date  . ALLERGIC RHINITIS, SEASONAL 10/23/2009  . Anxiety 09/08/2016  . Basal cell carcinoma of neck 05/28/2011   r suprahyoid, radical neck dissection S/p 6 weeks of targeted radiation therapy   . Cancer ([place])   . CAP (community acquired pneumonia) 03/02/2016  . Costochondritis 09/16/2015  . Diabetes mellitus approx 2003   type 2  . DIABETES MELLITUS, TYPE II 10/23/2009  . DYSPNEA ON EXERTION 10/23/2009  . ELEVATED BLOOD PRESSURE 04/23/2010  . GERD (gastroesophageal reflux disease)   . Hearing loss   . History of radiation therapy 07/06/2011- 08/19/2011   33 Fractions to Right facial nodes through the right neck and right trigeminal nerve  . History of radiation therapy 05/23/14, 05/25/14, 05/28/14, 05/30/14, 06/01/14   SBRT Right upper lullng mass 54 Gy in 3 fractions, Left lower lung mass and adjacent nodules 50 Gy in 5 fractions  . History of radiation therapy 02/11/2015- 02/20/15   Right lung superior upper lobe  50 Gy in 5 fractions, Left Lung posterior lower lobe 50 Gy in 5 fractions  . Hyperlipidemia   . Hypertension    Does not see a cardiologist, has not had a stress, echo   . Hypothyroid 01/28/2012  . Lung cancer ([place]) 03/08/14   invasive squamous cell carcinoma  . Mixed hyperlipidemia 10/23/2009  . Obesity   . OSA (obstructive sleep apnea)   . Otitis externa of right ear 07/06/2012  . Overweight(278.02) 10/23/2009  . Pedal edema 09/28/2016  . Preventative health care 09/30/2011  . Pulled muscle 07/06/12  . Right calf pain 09/08/2016  . S/P radiation therapy 07/06/11 - 08/19/11   Right Facial and Right Neck Nodes and right Trigeminal  Nerve to Base of Skull/ Total Dose 6600 cGy/ 33 Fractions  . Skin cancer    on nose, 9-10 yrs ago  . SLEEP APNEA, OBSTRUCTIVE 10/23/2009   Sleep study Done at [facility]  . SOB (shortness of breath) 09/16/2015  . Tinea pedis of both feet 03/02/2016  . Type II or unspecified type diabetes mellitus with unspecified complication, uncontrolled    type 2     Past Surgical History:  Procedure Laterality Date  . LUNG BIOPSY Right 03/08/2014  . RADICAL NECK DISSECTION  05/28/2011   Procedure: RADICAL NECK DISSECTION;  Surgeon: Melissa Montane, MD;  Location: [facility];  Service: ENT;  Laterality: N/A;  Suprahyoid Neck Dissection  . skin cancer removal    . TONSILLECTOMY AND ADENOIDECTOMY      Family History  Problem Relation Age of Onset  .  Other Mother        CHF  . Stroke Father   . Hyperlipidemia Father   . Heart disease Father        s/p valve replacement  . Hyperlipidemia Brother   . Obesity Brother   . Other Brother        Back pain  . Hyperlipidemia Brother   . Hypertension Brother   . Other Brother        Panic attacks  . Hyperlipidemia Brother   . Anesthesia problems Neg Hx     Social History   Socioeconomic History  . Marital status: Married    Spouse name: Not on file  . Number of children: Not on file  . Years of education: Not on file  . Highest  education level: Not on file  Occupational History  . Not on file  Social Needs  . Financial resource strain: Not on file  . Food insecurity    Worry: Not on file    Inability: Not on file  . Transportation needs    Medical: Not on file    Non-medical: Not on file  Tobacco Use  . Smoking status: Former Smoker    Packs/day: 1.00    Years: 9.00    Pack years: 9.00    Types: Cigarettes    Quit date: 03/31/1975    Years since quitting: 43.4  . Smokeless tobacco: Never Used  Substance and Sexual Activity  . Alcohol use: No    Alcohol/week: 0.0 standard drinks    Comment: 4 beers a month, 06/12/11 rarely uses now  . Drug use: No  . Sexual activity: Yes  Lifestyle  . Physical activity    Days per week: Not on file    Minutes per session: Not on file  . Stress: Not on file  Relationships  . Social Herbalist on phone: Not on file    Gets together: Not on file    Attends religious service: Not on file    Active member of club or organization: Not on file    Attends meetings of clubs or organizations: Not on file    Relationship status: Not on file  . Intimate partner violence    Fear of current or ex partner: Not on file    Emotionally abused: Not on file    Physically abused: Not on file    Forced sexual activity: Not on file  Other Topics Concern  . Not on file  Social History Narrative   Patient is married.   Patient with a history of smoking one pack per day for approximately 29 years from ages of [age]. Patient denies ever having used smokeless tobacco. Patient with rare use of alcohol.      Mother died at age 55 from congestive heart failure complications. Father died at the age of 57 secondary to a stroke.    Outpatient Medications Prior to Visit  Medication Sig Dispense Refill  . albuterol (PROVENTIL HFA;VENTOLIN HFA) 108 (90 Base) MCG/ACT inhaler INHALE 2 PUFFS INTO THE LUNGS EVERY 6 HOURS AS NEEDED FOR WHEEZING OR SHORTNESS OF BREATH 54 g 0  . albuterol  (PROVENTIL HFA;VENTOLIN HFA) 108 (90 Base) MCG/ACT inhaler INHALE 2 PUFFS INTO THE LUNGS EVERY 6 HOURS AS NEEDED FOR WHEEZING OR SHORTNESS OF BREATH 25.5 g 3  . ALPRAZolam (XANAX) 0.25 MG tablet Take 1 tablet (0.25 mg total) by mouth 2 (two) times daily as needed. for anxiety 60 tablet 2  .  Ascorbic Acid (VITAMIN C) 1000 MG tablet Take 1,000 mg by mouth daily.    . benzonatate (TESSALON) 100 MG capsule TAKE 1 CAPSULE(100 MG) BY MOUTH THREE TIMES DAILY AS NEEDED 40 capsule 0  . Blood Glucose Monitoring Suppl (CONTOUR BLOOD GLUCOSE SYSTEM) DEVI Use twice daily to check blood sugar.  DX E11.9 1 Device 0  . cetirizine (ZYRTEC) 10 MG tablet Take 10 mg by mouth daily as needed for allergies.     . chlorthalidone (HYGROTON) 25 MG tablet Take 1 tablet (25 mg total) by mouth daily. 30 tablet 3  . famotidine (PEPCID) 40 MG tablet TAKE 1 TABLET(40 MG) BY MOUTH DAILY 90 tablet 1  . fenofibrate 160 MG tablet TAKE 1 TABLET(160 MG) BY MOUTH DAILY 90 tablet 1  . fluticasone (FLONASE) 50 MCG/ACT nasal spray Place 2 sprays into both nostrils daily. 16 g 6  . glucose blood (CONTOUR NEXT TEST) test strip USE AS DIRECTED TWICE DAILY 100 each 11  . HYDROcodone-homatropine (HYCODAN) 5-1.5 MG/5ML syrup Take 5 mLs by mouth every 8 (eight) hours as needed. 100 mL 0  . ibuprofen (ADVIL,MOTRIN) 200 MG tablet Take 600 mg by mouth every 6 (six) hours as needed for pain.     Marland Kitchen insulin lispro (HUMALOG) 100 UNIT/ML injection Inject 0.2-0.24 mLs (20-24 Units total) into the skin 3 (three) times daily with meals. As directed and as needed 100 mL 3  . Insulin Pen Needle (B-D ULTRAFINE III SHORT PEN) 31G X 8 MM MISC USE AS DIRECTED TWICE DAILY WITH LANTUS 300 each 5  . Insulin Syringe-Needle U-100 (INSULIN SYRINGE 1CC/31GX5/16") 31G X 5/16" 1 ML MISC USE TWICE DAILY AS DIRECTED 300 each 1  . ipratropium (ATROVENT HFA) 17 MCG/ACT inhaler Inhale 2 puffs into the lungs every 6 (six) hours as needed for wheezing. 1 Inhaler 12  . Krill Oil  300 MG CAPS Take 1 capsule by mouth daily.    Marland Kitchen LANTUS SOLOSTAR 100 UNIT/ML Solostar Pen INJECT 100 UNITS UNDER THE SKIN EVERY MORNING AND 60 UNITS EVERY EVENING 135 mL 0  . levothyroxine (SYNTHROID, LEVOTHROID) 100 MCG tablet Take 1 tablet (100 mcg total) by mouth daily before breakfast. 90 tablet 2  . losartan (COZAAR) 50 MG tablet TAKE 1 TABLET(50 MG) BY MOUTH TWICE DAILY 120 tablet 0  . metFORMIN (GLUCOPHAGE) 500 MG tablet TAKE 2 TABLETS(1000 MG) BY MOUTH TWICE DAILY 360 tablet 0  . metoprolol succinate (TOPROL-XL) 100 MG 24 hr tablet TAKE 1 TABLET BY MOUTH EVERY DAY IMMEDIATELY FOLLOWING OR WITH A MEAL 90 tablet 0  . Microlet Lancets MISC USE TO TEST TWICE DAILY AS DIRECTED BY MD 100 each 11  . Misc Natural Products (OSTEO BI-FLEX TRIPLE STRENGTH PO) Take 2 tablets by mouth daily.    . mometasone (ASMANEX, 60 METERED DOSES,) 220 MCG/INH inhaler Inhale 1 puff into the lungs 2 (two) times daily. 1 Inhaler 5  . NON FORMULARY 1 each by Other route 4 (four) times daily. Accu-check multiclix lancets- use as directed    . Probiotic Product (PROBIOTIC DAILY PO) Take by mouth.    . promethazine (PHENERGAN) 12.5 MG tablet Take 1 tablet (12.5 mg total) by mouth every 6 (six) hours as needed for nausea or vomiting. 15 tablet 0  . rosuvastatin (CRESTOR) 40 MG tablet TAKE 1 TABLET(40 MG) BY MOUTH DAILY 90 tablet 1  . Syringe, Disposable, 3 ML MISC DX: E11.9/E11.65  Use bid as directed 300 each 5  . traMADol (ULTRAM) 50 MG tablet Take 1  tablet (50 mg total) by mouth every 6 (six) hours as needed for severe pain. 20 tablet 0  . WELCHOL 625 MG tablet TAKE 3 TABLETS(1875 MG) BY MOUTH TWICE DAILY WITH A MEAL 180 tablet 3   No facility-administered medications prior to visit.     No Known Allergies  Review of Systems  Constitutional: Positive for malaise/fatigue. Negative for fever.  HENT: Negative for congestion.   Eyes: Negative for blurred vision.  Respiratory: Negative for shortness of breath.    Cardiovascular: Negative for chest pain, palpitations and leg swelling.  Gastrointestinal: Negative for abdominal pain, blood in stool and nausea.  Genitourinary: Negative for dysuria and frequency.  Musculoskeletal: Negative for falls.  Skin: Negative for rash.  Neurological: Negative for dizziness, loss of consciousness and headaches.  Endo/Heme/Allergies: Negative for environmental allergies.  Psychiatric/Behavioral: Negative for depression. The patient is not nervous/anxious.        Objective:    Physical Exam Constitutional:      Appearance: Normal appearance.  HENT:     Head: Normocephalic and atraumatic.     Nose: Nose normal.  Eyes:     General:        Right eye: No discharge.        Left eye: No discharge.  Pulmonary:     Effort: Pulmonary effort is normal.  Neurological:     Mental Status: He is alert and oriented to person, place, and time.  Psychiatric:        Mood and Affect: Mood normal.        Behavior: Behavior normal.     BP 140/82   SpO2 97%  Wt Readings from Last 3 Encounters:  12/13/17 (!) 329 lb (149.2 kg)  07/15/17 (!) 329 lb 3.2 oz (149.3 kg)  04/29/17 (!) 330 lb (149.7 kg)    Diabetic Foot Exam - Simple   No data filed     Lab Results  Component Value Date   WBC 5.6 12/13/2017   HGB 14.1 12/13/2017   HCT 40.0 12/13/2017   PLT 187.0 12/13/2017   GLUCOSE 86 12/13/2017   CHOL 95 12/13/2017   TRIG 288.0 (H) 12/13/2017   HDL 19.10 (L) 12/13/2017   LDLDIRECT 42.0 12/13/2017   LDLCALC 18 09/08/2013   ALT 12 12/13/2017   AST 11 12/13/2017   NA 140 12/13/2017   K 4.7 12/13/2017   CL 105 12/13/2017   CREATININE 1.12 12/13/2017   BUN 24 (H) 12/13/2017   CO2 31 12/13/2017   TSH 2.26 12/13/2017   PSA 1.46 09/25/2011   INR 0.95 03/08/2014   HGBA1C 7.4 (H) 12/13/2017   MICROALBUR 0.7 09/10/2015    Lab Results  Component Value Date   TSH 2.26 12/13/2017   Lab Results  Component Value Date   WBC 5.6 12/13/2017   HGB 14.1  12/13/2017   HCT 40.0 12/13/2017   MCV 85.2 12/13/2017   PLT 187.0 12/13/2017   Lab Results  Component Value Date   NA 140 12/13/2017   K 4.7 12/13/2017   CO2 31 12/13/2017   GLUCOSE 86 12/13/2017   BUN 24 (H) 12/13/2017   CREATININE 1.12 12/13/2017   BILITOT 0.5 12/13/2017   ALKPHOS 43 12/13/2017   AST 11 12/13/2017   ALT 12 12/13/2017   PROT 6.2 12/13/2017   ALBUMIN 4.0 12/13/2017   CALCIUM 9.5 12/13/2017   ANIONGAP 5 11/19/2015   EGFR 70 (L) 01/10/2015   GFR 70.81 12/13/2017   Lab Results  Component Value Date  CHOL 95 12/13/2017   Lab Results  Component Value Date   HDL 19.10 (L) 12/13/2017   Lab Results  Component Value Date   LDLCALC 18 09/08/2013   Lab Results  Component Value Date   TRIG 288.0 (H) 12/13/2017   Lab Results  Component Value Date   CHOLHDL 5 12/13/2017   Lab Results  Component Value Date   HGBA1C 7.4 (H) 12/13/2017       Assessment & Plan:   Problem List Items Addressed This Visit    MIXED HYPERLIPIDEMIA - Primary   Relevant Orders   Lipid panel   Basal cell carcinoma of neck    Continues to follow with oncology and has further imaging scheduled soon. Feels well today      Hypertension    Well controlled, no changes to meds. Encouraged heart healthy diet such as the DASH diet and exercise as tolerated.       Diabetes mellitus type 2 in obese (HCC)     minimize simple carbs. Increase exercise as tolerated. Continue current meds      Relevant Orders   Hemoglobin A1c      I am having Steven Ibarra maintain his NON FORMULARY, cetirizine, ibuprofen, Misc Natural Products (OSTEO BI-FLEX TRIPLE STRENGTH PO), vitamin C, Krill Oil, Probiotic Product (PROBIOTIC DAILY PO), CONTOUR BLOOD GLUCOSE SYSTEM, promethazine, traMADol, fluticasone, Syringe (Disposable), ipratropium, HYDROcodone-homatropine, chlorthalidone, benzonatate, albuterol, insulin lispro, mometasone, levothyroxine, Insulin Pen Needle, INSULIN SYRINGE 1CC/31GX5/16",  Welchol, albuterol, ALPRAZolam, glucose blood, Microlet Lancets, Lantus SoloStar, fenofibrate, rosuvastatin, metoprolol succinate, metFORMIN, famotidine, and losartan.  No orders of the defined types were placed in this encounter.   I discussed the assessment and treatment plan with the patient. The patient was provided an opportunity to ask questions and all were answered. The patient agreed with the plan and demonstrated an understanding of the instructions.   The patient was advised to call back or seek an in-person evaluation if the symptoms worsen or if the condition fails to improve as anticipated.  I provided 25 minutes of non-face-to-face time during this encounter.   Penni Homans, MD

## 2018-09-11 NOTE — Assessment & Plan Note (Signed)
minimize simple carbs. Increase exercise as tolerated. Continue current meds  

## 2018-09-26 ENCOUNTER — Other Ambulatory Visit: Payer: Self-pay | Admitting: Family Medicine

## 2018-10-12 ENCOUNTER — Other Ambulatory Visit: Payer: Self-pay | Admitting: Family Medicine

## 2018-11-05 ENCOUNTER — Other Ambulatory Visit: Payer: Self-pay | Admitting: Family Medicine

## 2018-11-09 ENCOUNTER — Other Ambulatory Visit: Payer: Self-pay | Admitting: Family Medicine

## 2018-11-14 ENCOUNTER — Other Ambulatory Visit: Payer: Self-pay | Admitting: Family Medicine

## 2018-11-30 ENCOUNTER — Other Ambulatory Visit: Payer: Self-pay | Admitting: Family Medicine

## 2018-12-05 ENCOUNTER — Other Ambulatory Visit: Payer: Self-pay | Admitting: Family Medicine

## 2018-12-21 ENCOUNTER — Other Ambulatory Visit: Payer: Self-pay | Admitting: Family Medicine

## 2018-12-21 DIAGNOSIS — F419 Anxiety disorder, unspecified: Secondary | ICD-10-CM

## 2018-12-22 NOTE — Telephone Encounter (Signed)
Requesting:xanax Contract:yes UDS:low risk  Last OV:09/06/18 Next OV:12/27/18 Last Refill:06/12/18 #[id number] Database:   Please advise

## 2018-12-27 ENCOUNTER — Ambulatory Visit (INDEPENDENT_AMBULATORY_CARE_PROVIDER_SITE_OTHER): Payer: Commercial Managed Care - PPO

## 2018-12-27 ENCOUNTER — Other Ambulatory Visit: Payer: Self-pay

## 2018-12-27 ENCOUNTER — Other Ambulatory Visit (INDEPENDENT_AMBULATORY_CARE_PROVIDER_SITE_OTHER): Payer: Commercial Managed Care - PPO

## 2018-12-27 DIAGNOSIS — E782 Mixed hyperlipidemia: Secondary | ICD-10-CM

## 2018-12-27 DIAGNOSIS — E1169 Type 2 diabetes mellitus with other specified complication: Secondary | ICD-10-CM

## 2018-12-27 DIAGNOSIS — E669 Obesity, unspecified: Secondary | ICD-10-CM | POA: Diagnosis not present

## 2018-12-27 DIAGNOSIS — Z23 Encounter for immunization: Secondary | ICD-10-CM

## 2018-12-27 LAB — LIPID PANEL
Cholesterol: 108 mg/dL (ref 0–200)
HDL: 26 mg/dL — ABNORMAL LOW (ref >39.00)
NonHDL: 82.08
Total CHOL/HDL Ratio: 4
Triglycerides: 220 mg/dL — ABNORMAL HIGH (ref 0.0–149.0)
VLDL: 44 mg/dL — ABNORMAL HIGH (ref 0.0–40.0)

## 2018-12-27 LAB — HEMOGLOBIN A1C: Hgb A1c MFr Bld: 7.9 % — ABNORMAL HIGH (ref 4.6–6.5)

## 2018-12-27 LAB — LDL CHOLESTEROL, DIRECT: Direct LDL: 53 mg/dL

## 2018-12-28 ENCOUNTER — Telehealth: Payer: Self-pay

## 2018-12-28 ENCOUNTER — Ambulatory Visit (INDEPENDENT_AMBULATORY_CARE_PROVIDER_SITE_OTHER): Payer: Commercial Managed Care - PPO | Admitting: Family Medicine

## 2018-12-28 ENCOUNTER — Other Ambulatory Visit: Payer: Self-pay | Admitting: Family Medicine

## 2018-12-28 DIAGNOSIS — E1169 Type 2 diabetes mellitus with other specified complication: Secondary | ICD-10-CM | POA: Diagnosis not present

## 2018-12-28 DIAGNOSIS — I10 Essential (primary) hypertension: Secondary | ICD-10-CM | POA: Diagnosis not present

## 2018-12-28 DIAGNOSIS — F419 Anxiety disorder, unspecified: Secondary | ICD-10-CM

## 2018-12-28 DIAGNOSIS — E039 Hypothyroidism, unspecified: Secondary | ICD-10-CM | POA: Diagnosis not present

## 2018-12-28 DIAGNOSIS — E669 Obesity, unspecified: Secondary | ICD-10-CM

## 2018-12-28 MED ORDER — CHLORTHALIDONE 25 MG PO TABS: 25.0000 mg | ORAL_TABLET | Freq: Every day | ORAL | 1 refills | Status: DC | PRN

## 2018-12-28 MED ORDER — FENOFIBRATE 160 MG PO TABS: ORAL_TABLET | ORAL | 1 refills | Status: DC

## 2018-12-28 MED ORDER — ALBUTEROL SULFATE HFA 108 (90 BASE) MCG/ACT IN AERS: 1.0000 | INHALATION_SPRAY | RESPIRATORY_TRACT | 3 refills | Status: AC | PRN

## 2018-12-28 NOTE — Assessment & Plan Note (Signed)
He reports his systolic bp has been running 140s and 150s and he has stopped the Chlorthalidone so we will have him restart it. Prescription sent to pharmacy

## 2018-12-28 NOTE — Assessment & Plan Note (Signed)
His wife has lost her job after 34 years and they will be without health insurance next month. May contact us if he needs any further help in this regard.

## 2018-12-28 NOTE — Telephone Encounter (Signed)
Patient returning call and states insurance will expire after today, PCP has no available slots. 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 Best # [phone]

## 2018-12-28 NOTE — Assessment & Plan Note (Signed)
On Levothyroxine, continue to monitor 

## 2018-12-28 NOTE — Progress Notes (Signed)
Virtual Visit via Video Note  I connected with Steven Ibarra on 12/28/18 at 11:20 AM EDT by a video enabled telemedicine application and verified that I am speaking with the correct person using two identifiers.  Location: Patient: home Provider: home   I discussed the limitations of evaluation and management by telemedicine and the availability of in person appointments. The patient expressed understanding and agreed to proceed. Steven Ibarra, CMA was able to get the patient set up on a phone visit after being unable to set up a video visit.    Subjective:    Patient ID: Steven Ibarra, male    DOB: [birth year], 63 y.o.   MRN: [id number]  No chief complaint on file.   HPI Patient is in today for follow up on chronic medical concerns including diabetes, hypertension, hyperlipidemia and more. 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 He feels mostly well but is loosing his insurance next month. No recent febrile illness or hospitalizations. No polyuria or polydipsia. He notes he has been noting his sugar is up between 135 and 195 in am recently and he acknowledges he has been eating more ice cream lately. Denies CP/palp/SOB/HA/congestion/fevers/GI or GU c/o. Taking meds as prescribed  Past Medical History:  Diagnosis Date  . ALLERGIC RHINITIS, SEASONAL 10/23/2009  . Anxiety 09/08/2016  . Basal cell carcinoma of neck 05/28/2011   r suprahyoid, radical neck dissection S/p 6 weeks of targeted radiation therapy   . Cancer ([facility] [place])   . CAP (community acquired pneumonia) 03/02/2016  . Costochondritis 09/16/2015  . Diabetes mellitus approx 2003   type 2  . DIABETES MELLITUS, TYPE II 10/23/2009  . DYSPNEA ON EXERTION 10/23/2009  . ELEVATED BLOOD PRESSURE 04/23/2010  . GERD (gastroesophageal reflux disease)   . Hearing loss   . History of radiation therapy 07/06/2011- 08/19/2011   33 Fractions to Right facial nodes through the right neck and right  trigeminal nerve  . History of radiation therapy 05/23/14, 05/25/14, 05/28/14, 05/30/14, 06/01/14   SBRT Right upper lullng mass 54 Gy in 3 fractions, Left lower lung mass and adjacent nodules 50 Gy in 5 fractions  . History of radiation therapy 02/11/2015- 02/20/15   Right lung superior upper lobe 50 Gy in 5 fractions, Left Lung posterior lower lobe 50 Gy in 5 fractions  . Hyperlipidemia   . Hypertension    Does not see a cardiologist, has not had a stress, echo   . Hypothyroid 01/28/2012  . Lung cancer ([facility]) 03/08/14   invasive squamous cell carcinoma  . Mixed hyperlipidemia 10/23/2009  . Obesity   . OSA (obstructive sleep apnea)   . Otitis externa of right ear 07/06/2012  . Overweight(278.02) 10/23/2009  . Pedal edema 09/28/2016  . Preventative health care 09/30/2011  . Pulled muscle 07/06/12  . Right calf pain 09/08/2016  . S/P radiation therapy 07/06/11 - 08/19/11   Right Facial and Right Neck Nodes and right Trigeminal  Nerve to Base of Skull/ Total Dose 6600 cGy/ 33 Fractions  . Skin cancer    on nose, 9-10 yrs ago  . SLEEP APNEA, OBSTRUCTIVE 10/23/2009   Sleep study Done at [facility]  . SOB (shortness of breath) 09/16/2015  . Tinea pedis of both feet 03/02/2016  . Type II or unspecified type diabetes mellitus with unspecified complication, uncontrolled    type 2     Past Surgical History:  Procedure Laterality Date  . LUNG BIOPSY Right 03/08/2014  .  RADICAL NECK DISSECTION  05/28/2011   Procedure: RADICAL NECK DISSECTION;  Surgeon: Melissa Montane, MD;  Location: [facility];  Service: ENT;  Laterality: N/A;  Suprahyoid Neck Dissection  . skin cancer removal    . TONSILLECTOMY AND ADENOIDECTOMY      Family History  Problem Relation Age of Onset  . Other Mother        CHF  . Stroke Father   . Hyperlipidemia Father   . Heart disease Father        s/p valve replacement  . Hyperlipidemia Brother   . Obesity Brother   . Other Brother        Back pain  . Hyperlipidemia Brother   . Hypertension  Brother   . Other Brother        Panic attacks  . Hyperlipidemia Brother   . Anesthesia problems Neg Hx     Social History   Socioeconomic History  . Marital status: Married    Spouse name: Not on file  . Number of children: Not on file  . Years of education: Not on file  . Highest education level: Not on file  Occupational History  . Not on file  Social Needs  . Financial resource strain: Not on file  . Food insecurity    Worry: Not on file    Inability: Not on file  . Transportation needs    Medical: Not on file    Non-medical: Not on file  Tobacco Use  . Smoking status: Former Smoker    Packs/day: 1.00    Years: 9.00    Pack years: 9.00    Types: Cigarettes    Quit date: 03/31/1975    Years since quitting: 43.7  . Smokeless tobacco: Never Used  Substance and Sexual Activity  . Alcohol use: No    Alcohol/week: 0.0 standard drinks    Comment: 4 beers a month, 06/12/11 rarely uses now  . Drug use: No  . Sexual activity: Yes  Lifestyle  . Physical activity    Days per week: Not on file    Minutes per session: Not on file  . Stress: Not on file  Relationships  . Social Herbalist on phone: Not on file    Gets together: Not on file    Attends religious service: Not on file    Active member of club or organization: Not on file    Attends meetings of clubs or organizations: Not on file    Relationship status: Not on file  . Intimate partner violence    Fear of current or ex partner: Not on file    Emotionally abused: Not on file    Physically abused: Not on file    Forced sexual activity: Not on file  Other Topics Concern  . Not on file  Social History Narrative   Patient is married.   Patient with a history of smoking one pack per day for approximately 29 years from ages of [age]. Patient denies ever having used smokeless tobacco. Patient with rare use of alcohol.      Mother died at age 98 from congestive heart failure complications. Father died at  the age of 62 secondary to a stroke.    Outpatient Medications Prior to Visit  Medication Sig Dispense Refill  . albuterol (PROVENTIL HFA;VENTOLIN HFA) 108 (90 Base) MCG/ACT inhaler INHALE 2 PUFFS INTO THE LUNGS EVERY 6 HOURS AS NEEDED FOR WHEEZING OR SHORTNESS OF BREATH 54 g 0  .  ALPRAZolam (XANAX) 0.25 MG tablet TAKE 1 TABLET(0.25 MG) BY MOUTH TWICE DAILY AS NEEDED FOR ANXIETY 60 tablet 3  . Ascorbic Acid (VITAMIN C) 1000 MG tablet Take 1,000 mg by mouth daily.    . benzonatate (TESSALON) 100 MG capsule TAKE 1 CAPSULE(100 MG) BY MOUTH THREE TIMES DAILY AS NEEDED 40 capsule 0  . Blood Glucose Monitoring Suppl (CONTOUR BLOOD GLUCOSE SYSTEM) DEVI Use twice daily to check blood sugar.  DX E11.9 1 Device 0  . cetirizine (ZYRTEC) 10 MG tablet Take 10 mg by mouth daily as needed for allergies.     . chlorthalidone (HYGROTON) 25 MG tablet Take 1 tablet (25 mg total) by mouth daily as needed. 90 tablet 1  . famotidine (PEPCID) 40 MG tablet TAKE 1 TABLET(40 MG) BY MOUTH DAILY 90 tablet 1  . fenofibrate 160 MG tablet TAKE 1 TABLET(160 MG) BY MOUTH DAILY 90 tablet 1  . fluticasone (FLONASE) 50 MCG/ACT nasal spray Place 2 sprays into both nostrils daily. 16 g 6  . glucose blood (CONTOUR NEXT TEST) test strip USE AS DIRECTED TWICE DAILY 100 each 11  . HYDROcodone-homatropine (HYCODAN) 5-1.5 MG/5ML syrup Take 5 mLs by mouth every 8 (eight) hours as needed. 100 mL 0  . ibuprofen (ADVIL,MOTRIN) 200 MG tablet Take 600 mg by mouth every 6 (six) hours as needed for pain.     Marland Kitchen insulin lispro (HUMALOG) 100 UNIT/ML injection INJECT 20 TO 24 UNITS INTO SKIN THREE TIMES DAILY WITH MEALS AS DIRECTED AS NEEDED 100 mL 3  . Insulin Pen Needle (B-D ULTRAFINE III SHORT PEN) 31G X 8 MM MISC USE AS DIRECTED TWICE DAILY WITH LANTUS 300 each 5  . Insulin Syringe-Needle U-100 (INSULIN SYRINGE 1CC/31GX5/16") 31G X 5/16" 1 ML MISC USE TWICE DAILY AS DIRECTED 300 each 1  . ipratropium (ATROVENT HFA) 17 MCG/ACT inhaler Inhale 2  puffs into the lungs every 6 (six) hours as needed for wheezing. 1 Inhaler 12  . Krill Oil 300 MG CAPS Take 1 capsule by mouth daily.    Marland Kitchen LANTUS SOLOSTAR 100 UNIT/ML Solostar Pen INJECT 100 UNITS INTO THE SKIN IN MORNING AND INJECT 60 UNITS INTO THE SKIN EVERY EVENING. 135 mL 0  . levothyroxine (SYNTHROID) 100 MCG tablet TAKE 1 TABLET(100 MCG) BY MOUTH DAILY BEFORE BREAKFAST 90 tablet 2  . losartan (COZAAR) 50 MG tablet TAKE 1 TABLET(50 MG) BY MOUTH TWICE DAILY 120 tablet 0  . metFORMIN (GLUCOPHAGE) 500 MG tablet TAKE 2 TABLETS(1000 MG) BY MOUTH TWICE DAILY 360 tablet 1  . metoprolol succinate (TOPROL-XL) 100 MG 24 hr tablet TAKE 1 TABLET BY MOUTH EVERY DAY WITH OR IMMEDIATELY FOLLOWING A MEAL 90 tablet 0  . Microlet Lancets MISC USE TO TEST TWICE DAILY AS DIRECTED BY MD 100 each 11  . Misc Natural Products (OSTEO BI-FLEX TRIPLE STRENGTH PO) Take 2 tablets by mouth daily.    . mometasone (ASMANEX, 60 METERED DOSES,) 220 MCG/INH inhaler Inhale 1 puff into the lungs 2 (two) times daily. 1 Inhaler 5  . NON FORMULARY 1 each by Other route 4 (four) times daily. Accu-check multiclix lancets- use as directed    . Probiotic Product (PROBIOTIC DAILY PO) Take by mouth.    . promethazine (PHENERGAN) 12.5 MG tablet Take 1 tablet (12.5 mg total) by mouth every 6 (six) hours as needed for nausea or vomiting. 15 tablet 0  . rosuvastatin (CRESTOR) 40 MG tablet TAKE 1 TABLET(40 MG) BY MOUTH DAILY 90 tablet 1  . Syringe, Disposable, 3  ML MISC DX: E11.9/E11.65  Use bid as directed 300 each 5  . traMADol (ULTRAM) 50 MG tablet Take 1 tablet (50 mg total) by mouth every 6 (six) hours as needed for severe pain. 20 tablet 0  . WELCHOL 625 MG tablet TAKE 3 TABLETS(1875 MG) BY MOUTH TWICE DAILY WITH A MEAL 180 tablet 3   No facility-administered medications prior to visit.     No Known Allergies  Review of Systems  Constitutional: Positive for malaise/fatigue. Negative for fever.  HENT: Negative for congestion.    Eyes: Negative for blurred vision.  Respiratory: Negative for shortness of breath.   Cardiovascular: Negative for chest pain, palpitations and leg swelling.  Gastrointestinal: Negative for abdominal pain, blood in stool and nausea.  Genitourinary: Negative for dysuria and frequency.  Musculoskeletal: Negative for falls.  Skin: Negative for rash.  Neurological: Negative for dizziness, loss of consciousness and headaches.  Endo/Heme/Allergies: Negative for environmental allergies.  Psychiatric/Behavioral: Negative for depression. The patient is not nervous/anxious.        Objective:    Physical Exam unable to obtain via phone visit.   There were no vitals taken for this visit. Wt Readings from Last 3 Encounters:  12/13/17 (!) 329 lb (149.2 kg)  07/15/17 (!) 329 lb 3.2 oz (149.3 kg)  04/29/17 (!) 330 lb (149.7 kg)    Diabetic Foot Exam - Simple   No data filed     Lab Results  Component Value Date   WBC 5.6 12/13/2017   HGB 14.1 12/13/2017   HCT 40.0 12/13/2017   PLT 187.0 12/13/2017   GLUCOSE 86 12/13/2017   CHOL 108 12/27/2018   TRIG 220.0 (H) 12/27/2018   HDL 26.00 (L) 12/27/2018   LDLDIRECT 53.0 12/27/2018   LDLCALC 18 09/08/2013   ALT 12 12/13/2017   AST 11 12/13/2017   NA 140 12/13/2017   K 4.7 12/13/2017   CL 105 12/13/2017   CREATININE 1.12 12/13/2017   BUN 24 (H) 12/13/2017   CO2 31 12/13/2017   TSH 2.26 12/13/2017   PSA 1.46 09/25/2011   INR 0.95 03/08/2014   HGBA1C 7.9 (H) 12/27/2018   MICROALBUR 0.7 09/10/2015    Lab Results  Component Value Date   TSH 2.26 12/13/2017   Lab Results  Component Value Date   WBC 5.6 12/13/2017   HGB 14.1 12/13/2017   HCT 40.0 12/13/2017   MCV 85.2 12/13/2017   PLT 187.0 12/13/2017   Lab Results  Component Value Date   NA 140 12/13/2017   K 4.7 12/13/2017   CO2 31 12/13/2017   GLUCOSE 86 12/13/2017   BUN 24 (H) 12/13/2017   CREATININE 1.12 12/13/2017   BILITOT 0.5 12/13/2017   ALKPHOS 43 12/13/2017    AST 11 12/13/2017   ALT 12 12/13/2017   PROT 6.2 12/13/2017   ALBUMIN 4.0 12/13/2017   CALCIUM 9.5 12/13/2017   ANIONGAP 5 11/19/2015   EGFR 70 (L) 01/10/2015   GFR 70.81 12/13/2017   Lab Results  Component Value Date   CHOL 108 12/27/2018   Lab Results  Component Value Date   HDL 26.00 (L) 12/27/2018   Lab Results  Component Value Date   LDLCALC 18 09/08/2013   Lab Results  Component Value Date   TRIG 220.0 (H) 12/27/2018   Lab Results  Component Value Date   CHOLHDL 4 12/27/2018   Lab Results  Component Value Date   HGBA1C 7.9 (H) 12/27/2018       Assessment & Plan:  Problem List Items Addressed This Visit    Hypertension    He reports his systolic bp has been running 140s and 150s and he has stopped the Chlorthalidone so we will have him restart it. Prescription sent to pharmacy      Diabetes mellitus type 2 in obese [facility])    He notes his blood sugar has been up some recently between 135 and 195. He is only checking it in the morning. He is taking Lantus 60 units in evening and around 75 units in am as well as Humalog bid. He reports his numbers have improved some in past few days and he has committed to decreasing his carbohydrate intake      Hypothyroid    On Levothyroxine, continue to monitor      Anxiety    His wife has lost her job after 34 years and they will be without health insurance next month. May contact us if he needs any further help in this regard.          I am having [name] maintain his NON FORMULARY, cetirizine, ibuprofen, Misc Natural Products (OSTEO BI-FLEX TRIPLE STRENGTH PO), vitamin C, Krill Oil, Probiotic Product (PROBIOTIC DAILY PO), CONTOUR BLOOD GLUCOSE SYSTEM, promethazine, traMADol, fluticasone, Syringe (Disposable), ipratropium, HYDROcodone-homatropine, benzonatate, albuterol, mometasone, Insulin Pen Needle, INSULIN SYRINGE 1CC/31GX5/16", glucose blood, Microlet Lancets, rosuvastatin, famotidine, Welchol,  levothyroxine, metoprolol succinate, metFORMIN, Lantus SoloStar, insulin lispro, losartan, ALPRAZolam, fenofibrate, and chlorthalidone.  No orders of the defined types were placed in this encounter.    I discussed the assessment and treatment plan with the patient. The patient was provided an opportunity to ask questions and all were answered. The patient agreed with the plan and demonstrated an understanding of the instructions.   The patient was advised to call back or seek an in-person evaluation if the symptoms worsen or if the condition fails to improve as anticipated.  I provided 25 minutes of non-face-to-face time during this encounter.   Penni Homans, MD

## 2018-12-28 NOTE — Assessment & Plan Note (Signed)
He notes his blood sugar has been up some recently between 135 and 195. He is only checking it in the morning. He is taking Lantus 60 units in evening and around 75 units in am as well as Humalog bid. He reports his numbers have improved some in past few days and he has committed to decreasing his carbohydrate intake

## 2018-12-28 NOTE — Telephone Encounter (Signed)
Per Dr. Charlett Blake she wants patient to call the office to get a VV or in office visit to discuss his lab work. Left message for patient to call the office back

## 2018-12-28 NOTE — Telephone Encounter (Signed)
Patient had VV with pcp

## 2019-02-03 DIAGNOSIS — C760 Malignant neoplasm of head, face and neck: Secondary | ICD-10-CM | POA: Diagnosis not present

## 2019-02-03 DIAGNOSIS — Z5112 Encounter for antineoplastic immunotherapy: Secondary | ICD-10-CM | POA: Diagnosis not present

## 2019-02-03 DIAGNOSIS — Z9989 Dependence on other enabling machines and devices: Secondary | ICD-10-CM | POA: Diagnosis not present

## 2019-02-03 DIAGNOSIS — G4733 Obstructive sleep apnea (adult) (pediatric): Secondary | ICD-10-CM | POA: Diagnosis not present

## 2019-02-03 DIAGNOSIS — Z87891 Personal history of nicotine dependence: Secondary | ICD-10-CM | POA: Diagnosis not present

## 2019-02-03 DIAGNOSIS — C782 Secondary malignant neoplasm of pleura: Secondary | ICD-10-CM | POA: Diagnosis not present

## 2019-02-03 DIAGNOSIS — J701 Chronic and other pulmonary manifestations due to radiation: Secondary | ICD-10-CM | POA: Diagnosis not present

## 2019-02-03 DIAGNOSIS — C779 Secondary and unspecified malignant neoplasm of lymph node, unspecified: Secondary | ICD-10-CM | POA: Diagnosis not present

## 2019-02-03 DIAGNOSIS — J302 Other seasonal allergic rhinitis: Secondary | ICD-10-CM | POA: Diagnosis not present

## 2019-02-03 DIAGNOSIS — C7801 Secondary malignant neoplasm of right lung: Secondary | ICD-10-CM | POA: Diagnosis not present

## 2019-02-03 DIAGNOSIS — Z9889 Other specified postprocedural states: Secondary | ICD-10-CM | POA: Diagnosis not present

## 2019-02-03 DIAGNOSIS — C7800 Secondary malignant neoplasm of unspecified lung: Secondary | ICD-10-CM | POA: Diagnosis not present

## 2019-02-03 DIAGNOSIS — Z85828 Personal history of other malignant neoplasm of skin: Secondary | ICD-10-CM | POA: Diagnosis not present

## 2019-02-03 DIAGNOSIS — C4441 Basal cell carcinoma of skin of scalp and neck: Secondary | ICD-10-CM | POA: Diagnosis not present

## 2019-02-03 DIAGNOSIS — C770 Secondary and unspecified malignant neoplasm of lymph nodes of head, face and neck: Secondary | ICD-10-CM | POA: Diagnosis not present

## 2019-02-05 ENCOUNTER — Other Ambulatory Visit: Payer: Self-pay | Admitting: Family Medicine

## 2019-02-13 ENCOUNTER — Other Ambulatory Visit: Payer: Self-pay | Admitting: Family Medicine

## 2019-03-10 DIAGNOSIS — C7800 Secondary malignant neoplasm of unspecified lung: Secondary | ICD-10-CM | POA: Diagnosis not present

## 2019-03-10 DIAGNOSIS — C779 Secondary and unspecified malignant neoplasm of lymph node, unspecified: Secondary | ICD-10-CM | POA: Diagnosis not present

## 2019-03-10 DIAGNOSIS — Z79899 Other long term (current) drug therapy: Secondary | ICD-10-CM | POA: Diagnosis not present

## 2019-03-10 DIAGNOSIS — C760 Malignant neoplasm of head, face and neck: Secondary | ICD-10-CM | POA: Diagnosis not present

## 2019-03-10 DIAGNOSIS — C7802 Secondary malignant neoplasm of left lung: Secondary | ICD-10-CM | POA: Diagnosis not present

## 2019-03-10 DIAGNOSIS — C089 Malignant neoplasm of major salivary gland, unspecified: Secondary | ICD-10-CM | POA: Diagnosis not present

## 2019-03-10 DIAGNOSIS — C7801 Secondary malignant neoplasm of right lung: Secondary | ICD-10-CM | POA: Diagnosis not present

## 2019-03-10 DIAGNOSIS — Z87891 Personal history of nicotine dependence: Secondary | ICD-10-CM | POA: Diagnosis not present

## 2019-03-10 DIAGNOSIS — J302 Other seasonal allergic rhinitis: Secondary | ICD-10-CM | POA: Diagnosis not present

## 2019-03-10 DIAGNOSIS — Z85828 Personal history of other malignant neoplasm of skin: Secondary | ICD-10-CM | POA: Diagnosis not present

## 2019-03-10 DIAGNOSIS — C4441 Basal cell carcinoma of skin of scalp and neck: Secondary | ICD-10-CM | POA: Diagnosis not present

## 2019-03-10 DIAGNOSIS — C782 Secondary malignant neoplasm of pleura: Secondary | ICD-10-CM | POA: Diagnosis not present

## 2019-03-10 DIAGNOSIS — J701 Chronic and other pulmonary manifestations due to radiation: Secondary | ICD-10-CM | POA: Diagnosis not present

## 2019-03-29 ENCOUNTER — Other Ambulatory Visit: Payer: Self-pay | Admitting: Family Medicine

## 2019-04-07 DIAGNOSIS — L905 Scar conditions and fibrosis of skin: Secondary | ICD-10-CM | POA: Diagnosis not present

## 2019-04-07 DIAGNOSIS — C779 Secondary and unspecified malignant neoplasm of lymph node, unspecified: Secondary | ICD-10-CM | POA: Diagnosis not present

## 2019-04-07 DIAGNOSIS — C7800 Secondary malignant neoplasm of unspecified lung: Secondary | ICD-10-CM | POA: Diagnosis not present

## 2019-04-07 DIAGNOSIS — C782 Secondary malignant neoplasm of pleura: Secondary | ICD-10-CM | POA: Diagnosis not present

## 2019-04-07 DIAGNOSIS — J701 Chronic and other pulmonary manifestations due to radiation: Secondary | ICD-10-CM | POA: Diagnosis not present

## 2019-04-07 DIAGNOSIS — C760 Malignant neoplasm of head, face and neck: Secondary | ICD-10-CM | POA: Diagnosis not present

## 2019-04-07 DIAGNOSIS — C792 Secondary malignant neoplasm of skin: Secondary | ICD-10-CM | POA: Diagnosis not present

## 2019-04-07 DIAGNOSIS — Z923 Personal history of irradiation: Secondary | ICD-10-CM | POA: Diagnosis not present

## 2019-04-07 DIAGNOSIS — C7989 Secondary malignant neoplasm of other specified sites: Secondary | ICD-10-CM | POA: Diagnosis not present

## 2019-04-10 ENCOUNTER — Other Ambulatory Visit: Payer: Self-pay | Admitting: Family Medicine

## 2019-04-12 DIAGNOSIS — C7801 Secondary malignant neoplasm of right lung: Secondary | ICD-10-CM | POA: Diagnosis not present

## 2019-04-12 DIAGNOSIS — C7951 Secondary malignant neoplasm of bone: Secondary | ICD-10-CM | POA: Diagnosis not present

## 2019-04-12 DIAGNOSIS — C7802 Secondary malignant neoplasm of left lung: Secondary | ICD-10-CM | POA: Diagnosis not present

## 2019-04-12 DIAGNOSIS — R918 Other nonspecific abnormal finding of lung field: Secondary | ICD-10-CM | POA: Diagnosis not present

## 2019-04-12 DIAGNOSIS — M8448XA Pathological fracture, other site, initial encounter for fracture: Secondary | ICD-10-CM | POA: Diagnosis not present

## 2019-04-12 DIAGNOSIS — C7800 Secondary malignant neoplasm of unspecified lung: Secondary | ICD-10-CM | POA: Diagnosis not present

## 2019-04-14 DIAGNOSIS — J701 Chronic and other pulmonary manifestations due to radiation: Secondary | ICD-10-CM | POA: Diagnosis not present

## 2019-04-14 DIAGNOSIS — E278 Other specified disorders of adrenal gland: Secondary | ICD-10-CM | POA: Diagnosis not present

## 2019-04-14 DIAGNOSIS — X58XXXA Exposure to other specified factors, initial encounter: Secondary | ICD-10-CM | POA: Diagnosis not present

## 2019-04-14 DIAGNOSIS — C779 Secondary and unspecified malignant neoplasm of lymph node, unspecified: Secondary | ICD-10-CM | POA: Diagnosis not present

## 2019-04-14 DIAGNOSIS — C4441 Basal cell carcinoma of skin of scalp and neck: Secondary | ICD-10-CM | POA: Diagnosis not present

## 2019-04-14 DIAGNOSIS — Z9889 Other specified postprocedural states: Secondary | ICD-10-CM | POA: Diagnosis not present

## 2019-04-14 DIAGNOSIS — Z79899 Other long term (current) drug therapy: Secondary | ICD-10-CM | POA: Diagnosis not present

## 2019-04-14 DIAGNOSIS — M8438XA Stress fracture, other site, initial encounter for fracture: Secondary | ICD-10-CM | POA: Diagnosis not present

## 2019-04-14 DIAGNOSIS — C782 Secondary malignant neoplasm of pleura: Secondary | ICD-10-CM | POA: Diagnosis not present

## 2019-04-14 DIAGNOSIS — C770 Secondary and unspecified malignant neoplasm of lymph nodes of head, face and neck: Secondary | ICD-10-CM | POA: Diagnosis not present

## 2019-04-14 DIAGNOSIS — Z85828 Personal history of other malignant neoplasm of skin: Secondary | ICD-10-CM | POA: Diagnosis not present

## 2019-04-14 DIAGNOSIS — Z87891 Personal history of nicotine dependence: Secondary | ICD-10-CM | POA: Diagnosis not present

## 2019-04-14 DIAGNOSIS — H02829 Cysts of unspecified eye, unspecified eyelid: Secondary | ICD-10-CM | POA: Diagnosis not present

## 2019-04-14 DIAGNOSIS — C7801 Secondary malignant neoplasm of right lung: Secondary | ICD-10-CM | POA: Diagnosis not present

## 2019-04-24 ENCOUNTER — Inpatient Hospital Stay (HOSPITAL_COMMUNITY)
Admission: EM | Admit: 2019-04-24 | Discharge: 2019-04-28 | DRG: 871 | Disposition: A | Discharge status: Home / Self Care | Payer: BC Managed Care – PPO | Attending: Internal Medicine | Admitting: Internal Medicine

## 2019-04-24 ENCOUNTER — Encounter (HOSPITAL_COMMUNITY): Payer: Self-pay | Admitting: *Deleted

## 2019-04-24 ENCOUNTER — Emergency Department (HOSPITAL_COMMUNITY): Payer: BC Managed Care – PPO

## 2019-04-24 ENCOUNTER — Ambulatory Visit: Payer: BC Managed Care – PPO | Attending: Internal Medicine

## 2019-04-24 ENCOUNTER — Other Ambulatory Visit: Payer: Self-pay

## 2019-04-24 DIAGNOSIS — R05 Cough: Secondary | ICD-10-CM | POA: Diagnosis not present

## 2019-04-24 DIAGNOSIS — Z823 Family history of stroke: Secondary | ICD-10-CM | POA: Diagnosis not present

## 2019-04-24 DIAGNOSIS — K219 Gastro-esophageal reflux disease without esophagitis: Secondary | ICD-10-CM | POA: Diagnosis present

## 2019-04-24 DIAGNOSIS — Z6841 Body Mass Index (BMI) 40.0 and over, adult: Secondary | ICD-10-CM | POA: Diagnosis not present

## 2019-04-24 DIAGNOSIS — C770 Secondary and unspecified malignant neoplasm of lymph nodes of head, face and neck: Secondary | ICD-10-CM | POA: Diagnosis not present

## 2019-04-24 DIAGNOSIS — R0902 Hypoxemia: Secondary | ICD-10-CM

## 2019-04-24 DIAGNOSIS — R6520 Severe sepsis without septic shock: Secondary | ICD-10-CM | POA: Diagnosis not present

## 2019-04-24 DIAGNOSIS — E1165 Type 2 diabetes mellitus with hyperglycemia: Secondary | ICD-10-CM | POA: Diagnosis not present

## 2019-04-24 DIAGNOSIS — J9601 Acute respiratory failure with hypoxia: Secondary | ICD-10-CM | POA: Diagnosis present

## 2019-04-24 DIAGNOSIS — J189 Pneumonia, unspecified organism: Secondary | ICD-10-CM | POA: Diagnosis present

## 2019-04-24 DIAGNOSIS — Z923 Personal history of irradiation: Secondary | ICD-10-CM

## 2019-04-24 DIAGNOSIS — Z8249 Family history of ischemic heart disease and other diseases of the circulatory system: Secondary | ICD-10-CM | POA: Diagnosis not present

## 2019-04-24 DIAGNOSIS — F419 Anxiety disorder, unspecified: Secondary | ICD-10-CM | POA: Diagnosis present

## 2019-04-24 DIAGNOSIS — E039 Hypothyroidism, unspecified: Secondary | ICD-10-CM | POA: Diagnosis not present

## 2019-04-24 DIAGNOSIS — C3432 Malignant neoplasm of lower lobe, left bronchus or lung: Secondary | ICD-10-CM | POA: Diagnosis not present

## 2019-04-24 DIAGNOSIS — Z79890 Hormone replacement therapy (postmenopausal): Secondary | ICD-10-CM

## 2019-04-24 DIAGNOSIS — Z794 Long term (current) use of insulin: Secondary | ICD-10-CM

## 2019-04-24 DIAGNOSIS — Z20822 Contact with and (suspected) exposure to covid-19: Secondary | ICD-10-CM

## 2019-04-24 DIAGNOSIS — R1110 Vomiting, unspecified: Secondary | ICD-10-CM | POA: Diagnosis not present

## 2019-04-24 DIAGNOSIS — C3490 Malignant neoplasm of unspecified part of unspecified bronchus or lung: Secondary | ICD-10-CM | POA: Diagnosis not present

## 2019-04-24 DIAGNOSIS — E871 Hypo-osmolality and hyponatremia: Secondary | ICD-10-CM | POA: Diagnosis present

## 2019-04-24 DIAGNOSIS — G4733 Obstructive sleep apnea (adult) (pediatric): Secondary | ICD-10-CM | POA: Diagnosis present

## 2019-04-24 DIAGNOSIS — C7800 Secondary malignant neoplasm of unspecified lung: Secondary | ICD-10-CM | POA: Diagnosis present

## 2019-04-24 DIAGNOSIS — Z85828 Personal history of other malignant neoplasm of skin: Secondary | ICD-10-CM | POA: Diagnosis not present

## 2019-04-24 DIAGNOSIS — R000 Tachycardia, unspecified: Secondary | ICD-10-CM | POA: Diagnosis not present

## 2019-04-24 DIAGNOSIS — R6510 Systemic inflammatory response syndrome (SIRS) of non-infectious origin without acute organ dysfunction: Secondary | ICD-10-CM | POA: Diagnosis present

## 2019-04-24 DIAGNOSIS — R112 Nausea with vomiting, unspecified: Secondary | ICD-10-CM | POA: Diagnosis not present

## 2019-04-24 DIAGNOSIS — Z79899 Other long term (current) drug therapy: Secondary | ICD-10-CM

## 2019-04-24 DIAGNOSIS — E860 Dehydration: Secondary | ICD-10-CM | POA: Diagnosis present

## 2019-04-24 DIAGNOSIS — E876 Hypokalemia: Secondary | ICD-10-CM | POA: Diagnosis present

## 2019-04-24 DIAGNOSIS — R059 Cough, unspecified: Secondary | ICD-10-CM

## 2019-04-24 DIAGNOSIS — R0602 Shortness of breath: Secondary | ICD-10-CM | POA: Diagnosis not present

## 2019-04-24 DIAGNOSIS — A419 Sepsis, unspecified organism: Secondary | ICD-10-CM | POA: Diagnosis not present

## 2019-04-24 DIAGNOSIS — H9190 Unspecified hearing loss, unspecified ear: Secondary | ICD-10-CM | POA: Diagnosis present

## 2019-04-24 DIAGNOSIS — C782 Secondary malignant neoplasm of pleura: Secondary | ICD-10-CM | POA: Diagnosis not present

## 2019-04-24 DIAGNOSIS — C4441 Basal cell carcinoma of skin of scalp and neck: Secondary | ICD-10-CM | POA: Diagnosis not present

## 2019-04-24 DIAGNOSIS — E669 Obesity, unspecified: Secondary | ICD-10-CM | POA: Diagnosis present

## 2019-04-24 DIAGNOSIS — Y95 Nosocomial condition: Secondary | ICD-10-CM | POA: Diagnosis present

## 2019-04-24 DIAGNOSIS — I10 Essential (primary) hypertension: Secondary | ICD-10-CM | POA: Diagnosis present

## 2019-04-24 DIAGNOSIS — Z9981 Dependence on supplemental oxygen: Secondary | ICD-10-CM

## 2019-04-24 DIAGNOSIS — E278 Other specified disorders of adrenal gland: Secondary | ICD-10-CM | POA: Diagnosis present

## 2019-04-24 DIAGNOSIS — K8020 Calculus of gallbladder without cholecystitis without obstruction: Secondary | ICD-10-CM | POA: Diagnosis present

## 2019-04-24 DIAGNOSIS — Z87891 Personal history of nicotine dependence: Secondary | ICD-10-CM

## 2019-04-24 DIAGNOSIS — J9600 Acute respiratory failure, unspecified whether with hypoxia or hypercapnia: Secondary | ICD-10-CM

## 2019-04-24 DIAGNOSIS — E1169 Type 2 diabetes mellitus with other specified complication: Secondary | ICD-10-CM | POA: Diagnosis not present

## 2019-04-24 DIAGNOSIS — C779 Secondary and unspecified malignant neoplasm of lymph node, unspecified: Secondary | ICD-10-CM | POA: Diagnosis not present

## 2019-04-24 DIAGNOSIS — Z8349 Family history of other endocrine, nutritional and metabolic diseases: Secondary | ICD-10-CM

## 2019-04-24 HISTORY — DX: Systemic inflammatory response syndrome (sirs) of non-infectious origin without acute organ dysfunction: R65.10

## 2019-04-24 LAB — CBC WITH DIFFERENTIAL/PLATELET
Abs Immature Granulocytes: 0.06 10*3/uL (ref 0.00–0.07)
Basophils Absolute: 0 10*3/uL (ref 0.0–0.1)
Basophils Relative: 0 %
Eosinophils Absolute: 0 10*3/uL (ref 0.0–0.5)
Eosinophils Relative: 0 %
HCT: 39.4 % (ref 39.0–52.0)
Hemoglobin: 13.6 g/dL (ref 13.0–17.0)
Immature Granulocytes: 1 %
Lymphocytes Relative: 2 %
Lymphs Abs: 0.1 10*3/uL — ABNORMAL LOW (ref 0.7–4.0)
MCH: 30.3 pg (ref 26.0–34.0)
MCHC: 34.5 g/dL (ref 30.0–36.0)
MCV: 87.8 fL (ref 80.0–100.0)
Monocytes Absolute: 0.2 10*3/uL (ref 0.1–1.0)
Monocytes Relative: 4 %
Neutro Abs: 5.6 10*3/uL (ref 1.7–7.7)
Neutrophils Relative %: 93 %
Platelets: 117 10*3/uL — ABNORMAL LOW (ref 150–400)
RBC: 4.49 MIL/uL (ref 4.22–5.81)
RDW: 13.8 % (ref 11.5–15.5)
WBC: 6 10*3/uL (ref 4.0–10.5)
nRBC: 0 % (ref 0.0–0.2)

## 2019-04-24 LAB — FERRITIN: Ferritin: 232 ng/mL (ref 24–336)

## 2019-04-24 LAB — COMPREHENSIVE METABOLIC PANEL
ALT: 108 U/L — ABNORMAL HIGH (ref 0–44)
AST: 55 U/L — ABNORMAL HIGH (ref 15–41)
Albumin: 3.3 g/dL — ABNORMAL LOW (ref 3.5–5.0)
Alkaline Phosphatase: 58 U/L (ref 38–126)
Anion gap: 9 (ref 5–15)
BUN: 23 mg/dL (ref 8–23)
CO2: 25 mmol/L (ref 22–32)
Calcium: 9.2 mg/dL (ref 8.9–10.3)
Chloride: 95 mmol/L — ABNORMAL LOW (ref 98–111)
Creatinine, Ser: 1.15 mg/dL (ref 0.61–1.24)
GFR calc Af Amer: >60 mL/min (ref >60)
GFR calc non Af Amer: >60 mL/min (ref >60)
Glucose, Bld: 310 mg/dL — ABNORMAL HIGH (ref 70–99)
Potassium: 3.4 mmol/L — ABNORMAL LOW (ref 3.5–5.1)
Sodium: 129 mmol/L — ABNORMAL LOW (ref 135–145)
Total Bilirubin: 2 mg/dL — ABNORMAL HIGH (ref 0.3–1.2)
Total Protein: 6.4 g/dL — ABNORMAL LOW (ref 6.5–8.1)

## 2019-04-24 LAB — LIPASE, BLOOD: Lipase: 16 U/L (ref 11–51)

## 2019-04-24 LAB — TRIGLYCERIDES: Triglycerides: 228 mg/dL — ABNORMAL HIGH (ref <150)

## 2019-04-24 LAB — LACTIC ACID, PLASMA
Lactic Acid, Venous: 2.2 mmol/L (ref 0.5–1.9)
Lactic Acid, Venous: 2.6 mmol/L (ref 0.5–1.9)
Lactic Acid, Venous: 1.3 mmol/L (ref 0.5–1.9)

## 2019-04-24 LAB — POC SARS CORONAVIRUS 2 AG -  ED: SARS Coronavirus 2 Ag: NEGATIVE

## 2019-04-24 LAB — C-REACTIVE PROTEIN: CRP: 23.4 mg/dL — ABNORMAL HIGH (ref <1.0)

## 2019-04-24 LAB — PROCALCITONIN: Procalcitonin: 16.77 ng/mL

## 2019-04-24 LAB — D-DIMER, QUANTITATIVE: D-Dimer, Quant: 1.71 ug/mL-FEU — ABNORMAL HIGH (ref 0.00–0.50)

## 2019-04-24 LAB — LACTATE DEHYDROGENASE: LDH: 180 U/L (ref 98–192)

## 2019-04-24 LAB — FIBRINOGEN: Fibrinogen: 657 mg/dL — ABNORMAL HIGH (ref 210–475)

## 2019-04-24 MED ORDER — METRONIDAZOLE IN NACL 5-0.79 MG/ML-% IV SOLN
500.0000 mg | Freq: Three times a day (TID) | INTRAVENOUS | Status: DC
  Administered 2019-04-25 – 2019-04-27 (×8): 500 mg via INTRAVENOUS
  Filled 2019-04-24 (×8): qty 100

## 2019-04-24 MED ORDER — SODIUM CHLORIDE 0.9 % IV SOLN: INTRAVENOUS | Status: AC

## 2019-04-24 MED ORDER — VANCOMYCIN HCL IN DEXTROSE 1-5 GM/200ML-% IV SOLN
1000.0000 mg | Freq: Once | INTRAVENOUS | Status: DC
  Filled 2019-04-24: qty 200

## 2019-04-24 MED ORDER — IOHEXOL 300 MG/ML  SOLN
100.0000 mL | Freq: Once | INTRAMUSCULAR | Status: AC | PRN
  Administered 2019-04-24: 100 mL via INTRAVENOUS

## 2019-04-24 MED ORDER — SODIUM CHLORIDE 0.9 % IV SOLN
2.0000 g | Freq: Once | INTRAVENOUS | Status: AC
  Administered 2019-04-25: 2 g via INTRAVENOUS
  Filled 2019-04-24: qty 2

## 2019-04-24 MED ORDER — ACETAMINOPHEN 500 MG PO TABS
1000.0000 mg | ORAL_TABLET | Freq: Once | ORAL | Status: AC
  Administered 2019-04-24: 21:00:00 1000 mg via ORAL
  Filled 2019-04-24: qty 2

## 2019-04-24 MED ORDER — SODIUM CHLORIDE 0.9 % IV BOLUS
500.0000 mL | Freq: Once | INTRAVENOUS | Status: AC
  Administered 2019-04-24: 21:00:00 500 mL via INTRAVENOUS

## 2019-04-24 MED ORDER — DEXAMETHASONE SODIUM PHOSPHATE 10 MG/ML IJ SOLN
6.0000 mg | INTRAMUSCULAR | Status: DC
  Administered 2019-04-25: 6 mg via INTRAVENOUS
  Filled 2019-04-24: qty 1

## 2019-04-24 MED ORDER — SODIUM CHLORIDE 0.9 % IV BOLUS
1000.0000 mL | Freq: Once | INTRAVENOUS | Status: AC
  Administered 2019-04-24: 1000 mL via INTRAVENOUS

## 2019-04-24 MED ORDER — ALPRAZOLAM 0.5 MG PO TABS
0.2500 mg | ORAL_TABLET | Freq: Once | ORAL | Status: AC
  Administered 2019-04-24: 0.25 mg via ORAL
  Filled 2019-04-24: qty 1

## 2019-04-24 MED ORDER — POTASSIUM CHLORIDE CRYS ER 20 MEQ PO TBCR
40.0000 meq | EXTENDED_RELEASE_TABLET | Freq: Once | ORAL | Status: AC
  Administered 2019-04-25: 01:00:00 40 meq via ORAL
  Filled 2019-04-24: qty 2

## 2019-04-24 MED ORDER — ONDANSETRON HCL 4 MG/2ML IJ SOLN
4.0000 mg | Freq: Once | INTRAMUSCULAR | Status: AC
  Administered 2019-04-24: 4 mg via INTRAVENOUS
  Filled 2019-04-24: qty 2

## 2019-04-24 NOTE — ED Notes (Signed)
Date and time results received: 04/24/19 2239 (use smartphrase ".now" to insert current time)  Test: Lactic Acid Critical Value: 2.6  Name of Provider Notified: Zackowski  Orders Received? Or Actions Taken?:

## 2019-04-24 NOTE — ED Provider Notes (Signed)
[facility] EMERGENCY DEPARTMENT Provider Note   CSN: [id number] Arrival date & time: 04/24/19  1524     History Chief Complaint  Patient presents with  . Emesis    Steven Ibarra is a 64 y.o. male possible history of diabetes, dyspnea, lung cancer who presents for evaluation of 2 days of nausea/vomiting. He reports an onset of symptoms, he did have some actual vomiting. No blood noted in vomit. He reports that since then, he has had more dry heaves. He states he is still been nauseous. He has been able to tolerate a small amount of oatmeal but otherwise not much other p.o. He states that he is just felt generalized weak. He states that he normally has shortness of breath and states that this is no worse. He has noted some slight coughing. Cough is nonproductive. He does report that since he has been dry heaving, he has not been able to wear his CPAP like he normally does. He has not noted any fevers. He denies any chest pain, abdominal pain.  The history is provided by the patient.       Past Medical History:  Diagnosis Date  . ALLERGIC RHINITIS, SEASONAL 10/23/2009  . Anxiety 09/08/2016  . Basal cell carcinoma of neck 05/28/2011   r suprahyoid, radical neck dissection S/p 6 weeks of targeted radiation therapy   . Cancer ([place])   . CAP (community acquired pneumonia) 03/02/2016  . Costochondritis 09/16/2015  . Diabetes mellitus approx 2003   type 2  . DIABETES MELLITUS, TYPE II 10/23/2009  . DYSPNEA ON EXERTION 10/23/2009  . ELEVATED BLOOD PRESSURE 04/23/2010  . GERD (gastroesophageal reflux disease)   . Hearing loss   . History of radiation therapy 07/06/2011- 08/19/2011   33 Fractions to Right facial nodes through the right neck and right trigeminal nerve  . History of radiation therapy 05/23/14, 05/25/14, 05/28/14, 05/30/14, 06/01/14   SBRT Right upper lullng mass 54 Gy in 3 fractions, Left lower lung mass and adjacent nodules 50 Gy in 5 fractions  . History of radiation therapy  02/11/2015- 02/20/15   Right lung superior upper lobe 50 Gy in 5 fractions, Left Lung posterior lower lobe 50 Gy in 5 fractions  . Hyperlipidemia   . Hypertension    Does not see a cardiologist, has not had a stress, echo   . Hypothyroid 01/28/2012  . Lung cancer ([place]) 03/08/14   invasive squamous cell carcinoma  . Mixed hyperlipidemia 10/23/2009  . Obesity   . OSA (obstructive sleep apnea)   . Otitis externa of right ear 07/06/2012  . Overweight(278.02) 10/23/2009  . Pedal edema 09/28/2016  . Preventative health care 09/30/2011  . Pulled muscle 07/06/12  . Right calf pain 09/08/2016  . S/P radiation therapy 07/06/11 - 08/19/11   Right Facial and Right Neck Nodes and right Trigeminal  Nerve to Base of Skull/ Total Dose 6600 cGy/ 33 Fractions  . Skin cancer    on nose, 9-10 yrs ago  . SLEEP APNEA, OBSTRUCTIVE 10/23/2009   Sleep study Done at [facility]  . SOB (shortness of breath) 09/16/2015  . Tinea pedis of both feet 03/02/2016  . Type II or unspecified type diabetes mellitus with unspecified complication, uncontrolled    type 2     Patient Active Problem List   Diagnosis Date Noted  . SIRS (systemic inflammatory response syndrome) ([place]) 04/24/2019  . Skin lesion 04/13/2017  . Right knee pain 04/13/2017  . Pedal edema 09/28/2016  . Right calf  pain 09/08/2016  . Anxiety 09/08/2016  . Tinea pedis of both feet 03/02/2016  . Lung metastases ([place]) 04/04/2014  . Fatigue 03/16/2014  . Pulmonary hemorrhage 03/08/2014  . Lung nodules   . Otitis externa of right ear 07/06/2012  . Hypothyroid 01/28/2012  . Preventative health care 09/30/2011  . Skin cancer   . Hypertension   . GERD (gastroesophageal reflux disease)   . Diabetes mellitus type 2 in obese ([place])   . Basal cell carcinoma of neck 05/28/2011  . MIXED HYPERLIPIDEMIA 10/23/2009  . ALLERGIC RHINITIS, SEASONAL 10/23/2009  . DOE (dyspnea on exertion) 10/23/2009    Past Surgical History:  Procedure Laterality Date  . LUNG BIOPSY  Right 03/08/2014  . RADICAL NECK DISSECTION  05/28/2011   Procedure: RADICAL NECK DISSECTION;  Surgeon: Melissa Montane, MD;  Location: [place];  Service: ENT;  Laterality: N/A;  Suprahyoid Neck Dissection  . skin cancer removal    . TONSILLECTOMY AND ADENOIDECTOMY         Family History  Problem Relation Age of Onset  . Other Mother        CHF  . Stroke Father   . Hyperlipidemia Father   . Heart disease Father        s/p valve replacement  . Hyperlipidemia Brother   . Obesity Brother   . Other Brother        Back pain  . Hyperlipidemia Brother   . Hypertension Brother   . Other Brother        Panic attacks  . Hyperlipidemia Brother   . Anesthesia problems Neg Hx     Social History   Tobacco Use  . Smoking status: Former Smoker    Packs/day: 1.00    Years: 9.00    Pack years: 9.00    Types: Cigarettes    Quit date: 03/31/1975    Years since quitting: 44.0  . Smokeless tobacco: Never Used  Substance Use Topics  . Alcohol use: No    Alcohol/week: 0.0 standard drinks    Comment: 4 beers a month, 06/12/11 rarely uses now  . Drug use: No    Home Medications Prior to Admission medications   Medication Sig Start Date End Date Taking? Authorizing Provider  albuterol (PROVENTIL HFA;VENTOLIN HFA) 108 (90 Base) MCG/ACT inhaler INHALE 2 PUFFS INTO THE LUNGS EVERY 6 HOURS AS NEEDED FOR WHEEZING OR SHORTNESS OF BREATH 09/27/17   Mosie Lukes, MD  albuterol (VENTOLIN HFA) 108 (90 Base) MCG/ACT inhaler Inhale 1-2 puffs into the lungs every 4 (four) hours as needed for wheezing or shortness of breath. 12/28/18   Mosie Lukes, MD  ALPRAZolam Duanne Moron) 0.25 MG tablet TAKE 1 TABLET(0.25 MG) BY MOUTH TWICE DAILY AS NEEDED FOR ANXIETY 12/22/18   Mosie Lukes, MD  Ascorbic Acid (VITAMIN C) 1000 MG tablet Take 1,000 mg by mouth daily.    [provider]  BD INSULIN SYRINGE U/F 31G X 5/16" 1 ML MISC USE TO INJECT INSULIN TWICE DAILY 04/10/19   Mosie Lukes, MD  benzonatate (TESSALON)  100 MG capsule TAKE 1 CAPSULE(100 MG) BY MOUTH THREE TIMES DAILY AS NEEDED 08/27/17   Mosie Lukes, MD  Blood Glucose Monitoring Suppl (CONTOUR BLOOD GLUCOSE SYSTEM) DEVI Use twice daily to check blood sugar.  DX E11.9 03/19/15   Mosie Lukes, MD  cetirizine (ZYRTEC) 10 MG tablet Take 10 mg by mouth daily as needed for allergies.     [provider]  chlorthalidone (HYGROTON) 25  MG tablet Take 1 tablet (25 mg total) by mouth daily as needed. 12/28/18   Mosie Lukes, MD  famotidine (PEPCID) 40 MG tablet TAKE 1 TABLET(40 MG) BY MOUTH DAILY 02/06/19   Mosie Lukes, MD  fenofibrate 160 MG tablet TAKE 1 TABLET(160 MG) BY MOUTH DAILY 12/28/18   Mosie Lukes, MD  fluticasone (FLONASE) 50 MCG/ACT nasal spray Place 2 sprays into both nostrils daily. 07/22/16   Evelina Dun A, FNP  glucose blood (CONTOUR NEXT TEST) test strip USE AS DIRECTED TWICE DAILY 06/10/18   Mosie Lukes, MD  HYDROcodone-homatropine [facility]) 5-1.5 MG/5ML syrup Take 5 mLs by mouth every 8 (eight) hours as needed. 05/03/17   Saguier, Percell Miller, PA-C  ibuprofen (ADVIL,MOTRIN) 200 MG tablet Take 600 mg by mouth every 6 (six) hours as needed for pain.     [provider]  insulin lispro (HUMALOG) 100 UNIT/ML injection INJECT 20 TO 24 UNITS INTO SKIN THREE TIMES DAILY WITH MEALS AS DIRECTED AS NEEDED 12/01/18   Mosie Lukes, MD  Insulin Pen Needle (B-D ULTRAFINE III SHORT PEN) 31G X 8 MM MISC USE AS DIRECTED TWICE DAILY WITH LANTUS 04/10/19   Mosie Lukes, MD  ipratropium (ATROVENT HFA) 17 MCG/ACT inhaler Inhale 2 puffs into the lungs every 6 (six) hours as needed for wheezing. 04/29/17   Saguier, Percell Miller, PA-C  Krill Oil 300 MG CAPS Take 1 capsule by mouth daily.    [provider]  LANTUS SOLOSTAR 100 UNIT/ML Solostar Pen INJECT 100 UNITS INTO THE SKIN IN MORNING AND INJECT 60 UNITS INTO THE SKIN EVERY EVENING. 02/13/19   Mosie Lukes, MD  levothyroxine (SYNTHROID) 100 MCG tablet TAKE 1 TABLET(100 MCG)  BY MOUTH DAILY BEFORE BREAKFAST 10/12/18   Mosie Lukes, MD  losartan (COZAAR) 50 MG tablet TAKE 1 TABLET(50 MG) BY MOUTH TWICE DAILY 03/29/19   Mosie Lukes, MD  metFORMIN (GLUCOPHAGE) 500 MG tablet TAKE 2 TABLETS(1000 MG) BY MOUTH TWICE DAILY 11/10/18   Mosie Lukes, MD  metoprolol succinate (TOPROL-XL) 100 MG 24 hr tablet TAKE 1 TABLET BY MOUTH EVERY DAY WITH OR IMMEDIATELY FOLLOWING A MEAL 02/06/19   Mosie Lukes, MD  Microlet Lancets MISC USE TO TEST TWICE DAILY AS DIRECTED BY MD 06/10/18   Mosie Lukes, MD  Misc Natural Products (OSTEO BI-FLEX TRIPLE STRENGTH PO) Take 2 tablets by mouth daily.    [provider]  mometasone (ASMANEX, 60 METERED DOSES,) 220 MCG/INH inhaler Inhale 1 puff into the lungs 2 (two) times daily. 12/13/17   Mosie Lukes, MD  NON FORMULARY 1 each by Other route 4 (four) times daily. Accu-check multiclix lancets- use as directed    [provider]  Probiotic Product (PROBIOTIC DAILY PO) Take by mouth.    [provider]  promethazine (PHENERGAN) 12.5 MG tablet Take 1 tablet (12.5 mg total) by mouth every 6 (six) hours as needed for nausea or vomiting. 11/20/15   Elgie Collard, PA-C  rosuvastatin (CRESTOR) 40 MG tablet TAKE 1 TABLET(40 MG) BY MOUTH DAILY 02/06/19   Mosie Lukes, MD  Syringe, Disposable, 3 ML MISC DX: E11.9/E11.65  Use bid as directed 03/26/17   Mosie Lukes, MD  traMADol (ULTRAM) 50 MG tablet Take 1 tablet (50 mg total) by mouth every 6 (six) hours as needed for severe pain. 07/13/16   Mosie Lukes, MD  WELCHOL 625 MG tablet TAKE 3 TABLETS(1875 MG) BY MOUTH TWICE DAILY WITH A MEAL  09/26/18   Mosie Lukes, MD    Allergies    Patient has no known allergies.  Review of Systems   Review of Systems  Constitutional: Negative for fever.  Respiratory: Positive for cough. Negative for shortness of breath.   Cardiovascular: Negative for chest pain.  Gastrointestinal: Positive for nausea and vomiting.  Negative for abdominal pain.  Genitourinary: Negative for dysuria and hematuria.  Neurological: Positive for weakness (generalized). Negative for headaches.  All other systems reviewed and are negative.   Physical Exam Updated Vital Signs BP (!) 147/94   Pulse (!) 120   Temp (!) 103.4 F (39.7 C) (Rectal)   Resp (!) 22   SpO2 97%   Physical Exam Vitals and nursing note reviewed.  Constitutional:      Appearance: Normal appearance. He is well-developed.  HENT:     Head: Normocephalic and atraumatic.  Eyes:     General: Lids are normal.     Conjunctiva/sclera: Conjunctivae normal.     Pupils: Pupils are equal, round, and reactive to light.  Cardiovascular:     Rate and Rhythm: Normal rate and regular rhythm.     Pulses: Normal pulses.     Heart sounds: Normal heart sounds. No murmur. No friction rub. No gallop.   Pulmonary:     Effort: Pulmonary effort is normal.     Breath sounds: Normal breath sounds.     Comments: Lungs clear to auscultation bilaterally.  Symmetric chest rise.  No wheezing, rales, rhonchi. Abdominal:     Palpations: Abdomen is soft. Abdomen is not rigid.     Tenderness: There is no abdominal tenderness. There is no guarding.     Comments: Abdomen is soft, non-distended, non-tender. No rigidity, No guarding. No peritoneal signs.  Musculoskeletal:        General: Normal range of motion.     Cervical back: Full passive range of motion without pain.  Skin:    General: Skin is warm and dry.     Capillary Refill: Capillary refill takes less than 2 seconds.  Neurological:     Mental Status: He is alert and oriented to person, place, and time.     Comments: Generalized weakness noted throughout but no obvious neuro deficits. 5/5 strength to BUE and BLE  Psychiatric:        Speech: Speech normal.     ED Results / Procedures / Treatments   Labs (all labs ordered are listed, but only abnormal results are displayed) Labs Reviewed  COMPREHENSIVE METABOLIC  PANEL - Abnormal; Notable for the following components:      Result Value   Sodium 129 (*)    Potassium 3.4 (*)    Chloride 95 (*)    Glucose, Bld 310 (*)    Total Protein 6.4 (*)    Albumin 3.3 (*)    AST 55 (*)    ALT 108 (*)    Total Bilirubin 2.0 (*)    All other components within normal limits  CBC WITH DIFFERENTIAL/PLATELET - Abnormal; Notable for the following components:   Platelets 117 (*)    Lymphs Abs 0.1 (*)    All other components within normal limits  LACTIC ACID, PLASMA - Abnormal; Notable for the following components:   Lactic Acid, Venous 2.2 (*)    All other components within normal limits  D-DIMER, QUANTITATIVE (NOT AT [facility]) - Abnormal; Notable for the following components:   D-Dimer, Quant 1.71 (*)    All other components within normal limits  FIBRINOGEN - Abnormal; Notable for the following components:   Fibrinogen 657 (*)    All other components within normal limits  CULTURE, BLOOD (ROUTINE X 2)  CULTURE, BLOOD (ROUTINE X 2)  LIPASE, BLOOD  LACTIC ACID, PLASMA  PROCALCITONIN  LACTATE DEHYDROGENASE  FERRITIN  TRIGLYCERIDES  C-REACTIVE PROTEIN  LACTIC ACID, PLASMA  LACTIC ACID, PLASMA  POC SARS CORONAVIRUS 2 AG -  ED    EKG EKG Interpretation  Date/Time:  Monday April 24 2019 15:59:04 EST Ventricular Rate:  108 PR Interval:  158 QRS Duration: 84 QT Interval:  314 QTC Calculation: 420 R Axis:   13 Text Interpretation: Sinus tachycardia Possible Left atrial enlargement Borderline ECG Confirmed by Fredia Sorrow [phone]) on 04/24/2019 5:08:27 PM   Radiology CT Abdomen Pelvis W Contrast  Result Date: 04/24/2019 CLINICAL DATA:  Nausea and vomiting x2 days. EXAM: CT ABDOMEN AND PELVIS WITH CONTRAST TECHNIQUE: Multidetector CT imaging of the abdomen and pelvis was performed using the standard protocol following bolus administration of intravenous contrast. CONTRAST:  167mL OMNIPAQUE IOHEXOL 300 MG/ML  SOLN COMPARISON:  Chest CT, dated June 18, 2016, is available for comparison. FINDINGS: Lower chest: Mild atelectasis is seen within the left lung base. A stable 1.5 cm noncalcified lung nodule (approximately 3.04 Hounsfield units) is seen within the posteromedial aspect of the left lower lobe (axial CT image 20, CT series number 2). Hepatobiliary: No focal liver abnormality is seen. Subcentimeter gallstones are seen within the lumen of an otherwise normal-appearing gallbladder. Pancreas: Unremarkable. No pancreatic ductal dilatation or surrounding inflammatory changes. Spleen: Normal in size without focal abnormality. Adrenals/Urinary Tract: The right adrenal gland is normal in size and appearance. A stable 2.4 cm x 1.9 cm isodense left adrenal mass is seen. Kidneys are normal in size, without renal calculi or hydronephrosis. Several right renal cysts are seen. The largest measures approximately 3.2 cm x 2.3 cm and is seen along the lower pole of the right kidney. Bladder is unremarkable. Stomach/Bowel: There is a small hiatal hernia. Appendix appears normal. No evidence of bowel wall thickening, distention, or inflammatory changes. Vascular/Lymphatic: Mild aortic atherosclerosis. No enlarged abdominal or pelvic lymph nodes. Reproductive: The prostate gland is mildly enlarged. Other: No abdominal wall hernia or abnormality. No abdominopelvic ascites. Musculoskeletal: Chronic fifth, sixth and seventh left rib fractures are seen. Multilevel degenerative changes seen throughout the lumbar spine. IMPRESSION: 1. Cholelithiasis. 2. Stable left adrenal mass which may represent an adrenal adenoma. 3. Stable 1.5 cm noncalcified left lower lobe lung nodule. 4. Small hiatal hernia. Electronically Signed   By: Virgina Norfolk M.D.   On: 04/24/2019 20:08   DG Chest Portable 1 View  Result Date: 04/24/2019 CLINICAL DATA:  Cough for 2 days. EXAM: PORTABLE CHEST 1 VIEW COMPARISON:  04/29/2017 and prior radiographs FINDINGS: The cardiomediastinal silhouette is  unremarkable. LEFT basilar and RIGHT perihilar atelectasis/scarring again noted. There is no evidence of focal airspace disease, pulmonary edema, suspicious pulmonary nodule/mass, pleural effusion, or pneumothorax. No acute bony abnormalities are identified. IMPRESSION: No active disease. Electronically Signed   By: Margarette Canada M.D.   On: 04/24/2019 17:30    Procedures Procedures (including critical care time)  Medications Ordered in ED Medications  sodium chloride 0.9 % bolus 1,000 mL (0 mLs Intravenous Stopped 04/24/19 1831)  iohexol (OMNIPAQUE) 300 MG/ML solution 100 mL (100 mLs Intravenous Contrast Given 04/24/19 1946)  ondansetron (ZOFRAN) injection 4 mg (4 mg Intravenous Given 04/24/19 2027)  ALPRAZolam (XANAX) tablet 0.25 mg (0.25 mg Oral Given 04/24/19  2027)  sodium chloride 0.9 % bolus 500 mL (500 mLs Intravenous New Bag/Given 04/24/19 2037)  acetaminophen (TYLENOL) tablet 1,000 mg (1,000 mg Oral Given 04/24/19 2044)    ED Course  I have reviewed the triage vital signs and the nursing notes.  Pertinent labs & imaging results that were available during my care of the patient were reviewed by me and considered in my medical decision making (see chart for details).    MDM Rules/Calculators/A&P                      64 year old male past history of lung cancer, hyperlipidemia who presents for evaluation of nausea/vomiting x2 days. States now it is more dry heaving but just feels generalized weakness. Is also had some cough. On initial arrival, he is afebrile but is just slightly tachycardic. He is slightly tachypneic. Vitals otherwise stable. Plan to check labs.  Lactic is elevated 2.2.  I suspect this is more from dehydration than from infectious etiology.  Platelets are 117.  CMP shows sodium 129, calcium 3.4.  BUN and creatinine within normal limits.  AST is 55, ALT is 108. These are slightly higher than his typical baseline. He is not having any RUQ abdominal pain. Unfortunately, at this  time U/S is unavailable at this facility. Will plan for CT scan.  Lipase is unremarkable.  His initial rapid Covid was negative.  We will plan to do outpatient Covid.  CT on pelvis shows cholelithiasis with no evidence of cholecystitis.  He has a stable left adrenal mass as well as a stable 1.5 cm noncalcified lung nodule.  RN inform me that patient started shaking and was tachycardic with heart rate in the 130s-140s.  Additionally, as he was walking to the bathroom, he became hypoxic and dropped down to about 85-87%.  He was placed on 4 L O2 which bumped him up to 93%.  On my evaluation, patient appeared to have rigors.  His initial oral temperature was negative but when we checked a rectal temperature, is 103.4.  Patient given Tylenol and small amount of fluids.  EKG showed that he is in sinus rhythm.  I suspect this is likely Covid related.  Given that he is hypoxic, will plan for admission.  Discussed with hospitalist who accepts patient for admission.   ARTH NICASTRO was evaluated in Emergency Department on 04/24/2019 for the symptoms described in the history of present illness. He was evaluated in the context of the global COVID-19 pandemic, which necessitated consideration that the patient might be at risk for infection with the SARS-CoV-2 virus that causes COVID-19. Institutional protocols and algorithms that pertain to the evaluation of patients at risk for COVID-19 are in a state of rapid change based on information released by regulatory bodies including the CDC and federal and state organizations. These policies and algorithms were followed during the patient's care in the ED.  Portions of this note were generated with [employer]. Dictation errors may occur despite best attempts at proofreading.  Final Clinical Impression(s) / ED Diagnoses Final diagnoses:  Nausea and vomiting, intractability of vomiting not specified, unspecified vomiting type  Suspected COVID-19 virus  infection  Hypoxia    Rx / DC Orders ED Discharge Orders    None       Desma Mcgregor 04/24/19 2142    Fredia Sorrow, MD 04/24/19 2209

## 2019-04-24 NOTE — ED Notes (Signed)
Upon assessment, pt hyperventilating, shaking, HR in 140s, O2 87% on RA, placed on 4L O2, pt now 95%, PA made aware & at bedside.

## 2019-04-24 NOTE — ED Triage Notes (Signed)
C/o nausea and vomiting onset 2 days ago

## 2019-04-24 NOTE — H&P (Signed)
History and Physical    Steven Ibarra QMV:[id number] DOB: [birth year] DOA: 04/24/2019  PCP: Mosie Lukes, MD   Patient coming from: Home  I have personally briefly reviewed patient's old medical records in [place]  Chief Complaint: Vomiting, dry cough.  HPI: Steven Ibarra is a 64 y.o. male with medical history significant for lung cancer with metastasis, hypertension and diabetes mellitus.  Patient presented to the ED with complaints of nausea vomiting of 2 days duration.  Also reports poor p.o. intake and generalized weakness.  At baseline he has difficulty breathing but he reports this is unchanged.  He denies dysuria, he denies abdominal pain.  ED Course: Febrile to 103.4, tachycardic to 130s, O2 sats initially greater than 92% on room air.  Then patient had an episode of shaking with tachycardia, and with ambulation O2 sats dropped to 87% on room air he was placed on 4 L O2.  Lactic acid 2.2 >> 2.6.  POC Covid test negative.  Chest x-ray negative for acute abnormality, abdominal CT with contrast- cholelithiasis, left adrenal mass. Hospitalist admit for further evaluation and management.  Review of Systems: As per HPI all other systems reviewed and negative.  Past Medical History:  Diagnosis Date  . ALLERGIC RHINITIS, SEASONAL 10/23/2009  . Anxiety 09/08/2016  . Basal cell carcinoma of neck 05/28/2011   r suprahyoid, radical neck dissection S/p 6 weeks of targeted radiation therapy   . Cancer ([facility])   . CAP (community acquired pneumonia) 03/02/2016  . Costochondritis 09/16/2015  . Diabetes mellitus approx 2003   type 2  . DIABETES MELLITUS, TYPE II 10/23/2009  . DYSPNEA ON EXERTION 10/23/2009  . ELEVATED BLOOD PRESSURE 04/23/2010  . GERD (gastroesophageal reflux disease)   . Hearing loss   . History of radiation therapy 07/06/2011- 08/19/2011   33 Fractions to Right facial nodes through the right neck and right trigeminal nerve  . History of radiation therapy 05/23/14,  05/25/14, 05/28/14, 05/30/14, 06/01/14   SBRT Right upper lullng mass 54 Gy in 3 fractions, Left lower lung mass and adjacent nodules 50 Gy in 5 fractions  . History of radiation therapy 02/11/2015- 02/20/15   Right lung superior upper lobe 50 Gy in 5 fractions, Left Lung posterior lower lobe 50 Gy in 5 fractions  . Hyperlipidemia   . Hypertension    Does not see a cardiologist, has not had a stress, echo   . Hypothyroid 01/28/2012  . Lung cancer ([place]) 03/08/14   invasive squamous cell carcinoma  . Mixed hyperlipidemia 10/23/2009  . Obesity   . OSA (obstructive sleep apnea)   . Otitis externa of right ear 07/06/2012  . Overweight(278.02) 10/23/2009  . Pedal edema 09/28/2016  . Preventative health care 09/30/2011  . Pulled muscle 07/06/12  . Right calf pain 09/08/2016  . S/P radiation therapy 07/06/11 - 08/19/11   Right Facial and Right Neck Nodes and right Trigeminal  Nerve to Base of Skull/ Total Dose 6600 cGy/ 33 Fractions  . Skin cancer    on nose, 9-10 yrs ago  . SLEEP APNEA, OBSTRUCTIVE 10/23/2009   [facility] study [facility] at [facility]  . SOB (shortness of breath) 09/16/2015  . Tinea pedis of both feet 03/02/2016  . Type II or unspecified type diabetes mellitus with unspecified complication, uncontrolled    type 2     Past Surgical History:  Procedure Laterality Date  . LUNG BIOPSY Right 03/08/2014  . RADICAL NECK DISSECTION  05/28/2011   Procedure: RADICAL NECK  DISSECTION;  Surgeon: Melissa Montane, MD;  Location: [facility] [place];  Service: ENT;  Laterality: N/A;  Suprahyoid Neck Dissection  . skin cancer removal    . TONSILLECTOMY AND ADENOIDECTOMY       reports that he quit smoking about 44 years ago. His smoking use included cigarettes. He has a 9.00 pack-year smoking history. He has never used smokeless tobacco. He reports that he does not drink alcohol or use drugs.  No Known Allergies  Family History  Problem Relation Age of Onset  . Other Mother        CHF  . Stroke Father   . Hyperlipidemia Father     . Heart disease Father        s/p valve replacement  . Hyperlipidemia Brother   . Obesity Brother   . Other Brother        Back pain  . Hyperlipidemia Brother   . Hypertension Brother   . Other Brother        Panic attacks  . Hyperlipidemia Brother   . Anesthesia problems Neg Hx     Prior to Admission medications   Medication Sig Start Date End Date Taking? Authorizing Provider  albuterol (PROVENTIL HFA;VENTOLIN HFA) 108 (90 Base) MCG/ACT inhaler INHALE 2 PUFFS INTO THE LUNGS EVERY 6 HOURS AS NEEDED FOR WHEEZING OR SHORTNESS OF BREATH 09/27/17   Mosie Lukes, MD  albuterol (VENTOLIN HFA) 108 (90 Base) MCG/ACT inhaler Inhale 1-2 puffs into the lungs every 4 (four) hours as needed for wheezing or shortness of breath. 12/28/18   Mosie Lukes, MD  ALPRAZolam Duanne Moron) 0.25 MG tablet TAKE 1 TABLET(0.25 MG) BY MOUTH TWICE DAILY AS NEEDED FOR ANXIETY 12/22/18   Mosie Lukes, MD  Ascorbic Acid (VITAMIN C) 1000 MG tablet Take 1,000 mg by mouth daily.    [provider]  BD INSULIN SYRINGE U/F 31G X 5/16" 1 ML MISC USE TO INJECT INSULIN TWICE DAILY 04/10/19   Mosie Lukes, MD  benzonatate (TESSALON) 100 MG capsule TAKE 1 CAPSULE(100 MG) BY MOUTH THREE TIMES DAILY AS NEEDED 08/27/17   Mosie Lukes, MD  Blood Glucose Monitoring Suppl (CONTOUR BLOOD GLUCOSE SYSTEM) DEVI Use twice daily to check blood sugar.  DX E11.9 03/19/15   Mosie Lukes, MD  cetirizine (ZYRTEC) 10 MG tablet Take 10 mg by mouth daily as needed for allergies.     [provider]  chlorthalidone (HYGROTON) 25 MG tablet Take 1 tablet (25 mg total) by mouth daily as needed. 12/28/18   Mosie Lukes, MD  famotidine (PEPCID) 40 MG tablet TAKE 1 TABLET(40 MG) BY MOUTH DAILY 02/06/19   Mosie Lukes, MD  fenofibrate 160 MG tablet TAKE 1 TABLET(160 MG) BY MOUTH DAILY 12/28/18   Mosie Lukes, MD  fluticasone (FLONASE) 50 MCG/ACT nasal spray Place 2 sprays into both nostrils daily. 07/22/16   Evelina Dun A,  FNP  glucose blood (CONTOUR NEXT TEST) test strip USE AS DIRECTED TWICE DAILY 06/10/18   Mosie Lukes, MD  HYDROcodone-homatropine [facility]) 5-1.5 MG/5ML syrup Take 5 mLs by mouth every 8 (eight) hours as needed. 05/03/17   Saguier, Percell Miller, PA-C  ibuprofen (ADVIL,MOTRIN) 200 MG tablet Take 600 mg by mouth every 6 (six) hours as needed for pain.     [provider]  insulin lispro (HUMALOG) 100 UNIT/ML injection INJECT 20 TO 24 UNITS INTO SKIN THREE TIMES DAILY WITH MEALS AS DIRECTED AS NEEDED 12/01/18   Mosie Lukes, MD  Insulin Pen Needle (B-D ULTRAFINE III SHORT PEN) 31G X 8 MM MISC USE AS DIRECTED TWICE DAILY WITH LANTUS 04/10/19   Mosie Lukes, MD  ipratropium (ATROVENT HFA) 17 MCG/ACT inhaler Inhale 2 puffs into the lungs every 6 (six) hours as needed for wheezing. 04/29/17   Saguier, Percell Miller, PA-C  Krill Oil 300 MG CAPS Take 1 capsule by mouth daily.    [provider]  LANTUS SOLOSTAR 100 UNIT/ML Solostar Pen INJECT 100 UNITS INTO THE SKIN IN MORNING AND INJECT 60 UNITS INTO THE SKIN EVERY EVENING. 02/13/19   Mosie Lukes, MD  levothyroxine (SYNTHROID) 100 MCG tablet TAKE 1 TABLET(100 MCG) BY MOUTH DAILY BEFORE BREAKFAST 10/12/18   Mosie Lukes, MD  losartan (COZAAR) 50 MG tablet TAKE 1 TABLET(50 MG) BY MOUTH TWICE DAILY 03/29/19   Mosie Lukes, MD  metFORMIN (GLUCOPHAGE) 500 MG tablet TAKE 2 TABLETS(1000 MG) BY MOUTH TWICE DAILY 11/10/18   Mosie Lukes, MD  metoprolol succinate (TOPROL-XL) 100 MG 24 hr tablet TAKE 1 TABLET BY MOUTH EVERY DAY WITH OR IMMEDIATELY FOLLOWING A MEAL 02/06/19   Mosie Lukes, MD  Microlet Lancets MISC USE TO TEST TWICE DAILY AS DIRECTED BY MD 06/10/18   Mosie Lukes, MD  Misc Natural Products (OSTEO BI-FLEX TRIPLE STRENGTH PO) Take 2 tablets by mouth daily.    [provider]  mometasone (ASMANEX, 60 METERED DOSES,) 220 MCG/INH inhaler Inhale 1 puff into the lungs 2 (two) times daily. 12/13/17   Mosie Lukes, MD  NON  FORMULARY 1 each by Other route 4 (four) times daily. Accu-check multiclix lancets- use as directed    [provider]  Probiotic Product (PROBIOTIC DAILY PO) Take by mouth.    [provider]  promethazine (PHENERGAN) 12.5 MG tablet Take 1 tablet (12.5 mg total) by mouth every 6 (six) hours as needed for nausea or vomiting. 11/20/15   Elgie Collard, PA-C  rosuvastatin (CRESTOR) 40 MG tablet TAKE 1 TABLET(40 MG) BY MOUTH DAILY 02/06/19   Mosie Lukes, MD  Syringe, Disposable, 3 ML MISC DX: E11.9/E11.65  Use bid as directed 03/26/17   Mosie Lukes, MD  traMADol (ULTRAM) 50 MG tablet Take 1 tablet (50 mg total) by mouth every 6 (six) hours as needed for severe pain. 07/13/16   Mosie Lukes, MD  [facility] 625 MG tablet TAKE 3 [phone] MG) BY MOUTH TWICE DAILY WITH A MEAL 09/26/18   Mosie Lukes, MD    Physical Exam: Vitals:   04/24/19 2037 04/24/19 2100 04/24/19 2130 04/24/19 2200  BP:  124/83 (!) 147/94 128/86  Pulse:  (!) 120    Resp:  (!) 31 (!) 22 (!) 22  Temp: (!) 103.4 F (39.7 C)     TempSrc: Rectal     SpO2:  97%      Constitutional: NAD, calm, comfortable Vitals:   04/24/19 2037 04/24/19 2100 04/24/19 2130 04/24/19 2200  BP:  124/83 (!) 147/94 128/86  Pulse:  (!) 120    Resp:  (!) 31 (!) 22 (!) 22  Temp: (!) 103.4 F (39.7 C)     TempSrc: Rectal     SpO2:  97%     Eyes: PERRL, lids and conjunctivae normal ENMT: Mucous membranes are moist.  Neck: normal, supple, no masses, no thyromegaly Respiratory: normal respiratory effort. No accessory muscle use.  Cardiovascular: Regular rate and rhythm, chronic unchanged 1+ pitting bilateral extremity edema with chronic venous stasis  2+ pedal  pulses.   Abdomen: Full, no tenderness. Bowel sounds positive.  Musculoskeletal: no clubbing / cyanosis. No joint deformity upper and lower extremities. Good ROM, no contractures. Normal muscle tone.  Skin: no ulcers. No induration Neurologic: Gross cranial nerve  abnormalities, moving extremities spontaneously Psychiatric: Normal judgment and insight. Alert and oriented x 3. Normal mood.   Labs on Admission: I have personally reviewed following labs and imaging studies  CBC: Recent Labs  Lab 04/24/19 1710  WBC 6.0  NEUTROABS 5.6  HGB 13.6  HCT 39.4  MCV 87.8  PLT 161*   Basic Metabolic Panel: Recent Labs  Lab 04/24/19 1710  NA 129*  K 3.4*  CL 95*  CO2 25  GLUCOSE 310*  BUN 23  CREATININE 1.15  CALCIUM 9.2   GFR: CrCl cannot be calculated (Unknown ideal weight.). Liver Function Tests: Recent Labs  Lab 04/24/19 1710  AST 55*  ALT 108*  ALKPHOS 58  BILITOT 2.0*  PROT 6.4*  ALBUMIN 3.3*   Recent Labs  Lab 04/24/19 1710  LIPASE 16   Lipid Profile: Recent Labs    04/24/19 1710  TRIG 228*   Anemia Panel: Recent Labs    04/24/19 1710  FERRITIN 232   Urine analysis:    Component Value Date/Time   COLORURINE YELLOW 03/25/2010 2030   APPEARANCEUR CLEAR 03/25/2010 2030   LABSPEC 1.020 03/25/2010 2030   PHURINE 5.0 03/25/2010 2030   GLUCOSEU >1000 (A) 03/25/2010 2030   HGBUR NEGATIVE 03/25/2010 2030   BILIRUBINUR neg 09/25/2011 0844   KETONESUR 15 (A) 03/25/2010 2030   PROTEINUR neg 09/25/2011 0844   PROTEINUR NEGATIVE 03/25/2010 2030   UROBILINOGEN 0.2 09/25/2011 0844   UROBILINOGEN 0.2 03/25/2010 2030   NITRITE neg 09/25/2011 0844   NITRITE NEGATIVE 03/25/2010 2030   LEUKOCYTESUR Negative 09/25/2011 0844    Radiological Exams on Admission: CT Abdomen Pelvis W Contrast  Result Date: 04/24/2019 CLINICAL DATA:  Nausea and vomiting x2 days. EXAM: CT ABDOMEN AND PELVIS WITH CONTRAST TECHNIQUE: Multidetector CT imaging of the abdomen and pelvis was performed using the standard protocol following bolus administration of intravenous contrast. CONTRAST:  154mL OMNIPAQUE IOHEXOL 300 MG/ML  SOLN COMPARISON:  Chest CT, dated June 18, 2016, is available for comparison. FINDINGS: Lower chest: Mild atelectasis is seen  within the left lung base. A stable 1.5 cm noncalcified lung nodule (approximately 3.04 Hounsfield units) is seen within the posteromedial aspect of the left lower lobe (axial CT image 20, CT series number 2). Hepatobiliary: No focal liver abnormality is seen. Subcentimeter gallstones are seen within the lumen of an otherwise normal-appearing gallbladder. Pancreas: Unremarkable. No pancreatic ductal dilatation or surrounding inflammatory changes. Spleen: Normal in size without focal abnormality. Adrenals/Urinary Tract: The right adrenal gland is normal in size and appearance. A stable 2.4 cm x 1.9 cm isodense left adrenal mass is seen. Kidneys are normal in size, without renal calculi or hydronephrosis. Several right renal cysts are seen. The largest measures approximately 3.2 cm x 2.3 cm and is seen along the lower pole of the right kidney. Bladder is unremarkable. Stomach/Bowel: There is a small hiatal hernia. Appendix appears normal. No evidence of bowel wall thickening, distention, or inflammatory changes. Vascular/Lymphatic: Mild aortic atherosclerosis. No enlarged abdominal or pelvic lymph nodes. Reproductive: The prostate gland is mildly enlarged. Other: No abdominal wall hernia or abnormality. No abdominopelvic ascites. Musculoskeletal: Chronic fifth, sixth and seventh left rib fractures are seen. Multilevel degenerative changes seen throughout the lumbar spine. IMPRESSION: 1. Cholelithiasis. 2. Stable left adrenal  mass which may represent an adrenal adenoma. 3. Stable 1.5 cm noncalcified left lower lobe lung nodule. 4. Small hiatal hernia. Electronically Signed   By: Virgina Norfolk M.D.   On: 04/24/2019 20:08   DG Chest Portable 1 View  Result Date: 04/24/2019 CLINICAL DATA:  Cough for 2 days. EXAM: PORTABLE CHEST 1 VIEW COMPARISON:  04/29/2017 and prior radiographs FINDINGS: The cardiomediastinal silhouette is unremarkable. LEFT basilar and RIGHT perihilar atelectasis/scarring again noted. There is  no evidence of focal airspace disease, pulmonary edema, suspicious pulmonary nodule/mass, pleural effusion, or pneumothorax. No acute bony abnormalities are identified. IMPRESSION: No active disease. Electronically Signed   By: Margarette Canada M.D.   On: 04/24/2019 17:30    EKG: Independently reviewed.  Tachycardia, rate 108.  QTc 420.  No significant change compared to prior.   Assessment/Plan Active Problems:   SIRS (systemic inflammatory response syndrome) (HCC)  SIRs- febrile to 103.4, with tachycardia, tachypnea.  WBC 6.  Lactic acidosis 2.2 >> 2.6.  O2 sats greater than 92% on room air, dropped to 87% with ambulation.  Chest x-ray negative for active disease.  Mild transaminitis - AST 55, ALT 108.  Abdominal CT negative for acute abnormality.  Elevation in inflammatory markers, D-dimer, CRP. POC test negative.  Awaiting Covid PCR.  Procalcitonin suggesting bacterial etiology elevated at 16.77 -Follow-up blood cultures -Obtain UA and urine cultures -IV dexamethasone 6 mg daily -Daily inflammatory panel -Repeat chest x-ray in a.m. after hydration -Considering immunocompromised state will start broad-spectrum IV antibiotics IV vancomycin, cefepime and metronidazole -Trend lactic acid  Hyponatremia, hypokalemia-  Na 129 (baseline In the 140s)  K-2.4. Likely from vomiting and poor p.o. intake.  -1.5 L bolus given, continue N/s 100cc/hr x 15 hrs. -CMP a.m.  History of metastatic lung cancer-oncologist at [facility]. Undergoing immunotherapy/chemotherapy.  HTN- stable - Hold Home chlorthalidone while hydrating -Resume home losartan, metoprolol  Diabetes mellitus - Hold Metformin - pending med rec, resume home lantus at reduced dose 30u BID - SSI - M  Stable left adrenal mass-seen on CT abdomen.  DVT prophylaxis: Lovenox Code Status: Full code Family Communication: None at bedside Disposition Plan: Per rounding team Consults called: None Admission status: Observation,  telemetry   Hallock MD [facility]  04/24/2019, 11:05 PM

## 2019-04-24 NOTE — ED Notes (Signed)
Date and time results received: 04/24/19 1810 (use smartphrase ".now" to insert current time)  Test: Lactic Acid Critical Value: 2.2  Name of Provider Notified:L. Layden PA  Orders Received? Or Actions Taken?: NA

## 2019-04-25 ENCOUNTER — Inpatient Hospital Stay (HOSPITAL_COMMUNITY): Payer: BC Managed Care – PPO

## 2019-04-25 ENCOUNTER — Observation Stay (HOSPITAL_COMMUNITY): Payer: BC Managed Care – PPO

## 2019-04-25 DIAGNOSIS — E871 Hypo-osmolality and hyponatremia: Secondary | ICD-10-CM

## 2019-04-25 DIAGNOSIS — C7800 Secondary malignant neoplasm of unspecified lung: Secondary | ICD-10-CM | POA: Diagnosis present

## 2019-04-25 DIAGNOSIS — E860 Dehydration: Secondary | ICD-10-CM

## 2019-04-25 DIAGNOSIS — R000 Tachycardia, unspecified: Secondary | ICD-10-CM | POA: Diagnosis present

## 2019-04-25 DIAGNOSIS — A419 Sepsis, unspecified organism: Secondary | ICD-10-CM | POA: Diagnosis not present

## 2019-04-25 DIAGNOSIS — E1169 Type 2 diabetes mellitus with other specified complication: Secondary | ICD-10-CM

## 2019-04-25 DIAGNOSIS — J189 Pneumonia, unspecified organism: Secondary | ICD-10-CM

## 2019-04-25 DIAGNOSIS — R112 Nausea with vomiting, unspecified: Secondary | ICD-10-CM | POA: Diagnosis not present

## 2019-04-25 DIAGNOSIS — F419 Anxiety disorder, unspecified: Secondary | ICD-10-CM | POA: Diagnosis present

## 2019-04-25 DIAGNOSIS — E876 Hypokalemia: Secondary | ICD-10-CM

## 2019-04-25 DIAGNOSIS — G4733 Obstructive sleep apnea (adult) (pediatric): Secondary | ICD-10-CM | POA: Diagnosis present

## 2019-04-25 DIAGNOSIS — J9601 Acute respiratory failure with hypoxia: Secondary | ICD-10-CM

## 2019-04-25 DIAGNOSIS — Z8249 Family history of ischemic heart disease and other diseases of the circulatory system: Secondary | ICD-10-CM | POA: Diagnosis not present

## 2019-04-25 DIAGNOSIS — Z85828 Personal history of other malignant neoplasm of skin: Secondary | ICD-10-CM | POA: Diagnosis not present

## 2019-04-25 DIAGNOSIS — C3432 Malignant neoplasm of lower lobe, left bronchus or lung: Secondary | ICD-10-CM

## 2019-04-25 DIAGNOSIS — E669 Obesity, unspecified: Secondary | ICD-10-CM | POA: Diagnosis present

## 2019-04-25 DIAGNOSIS — K219 Gastro-esophageal reflux disease without esophagitis: Secondary | ICD-10-CM | POA: Diagnosis present

## 2019-04-25 DIAGNOSIS — E039 Hypothyroidism, unspecified: Secondary | ICD-10-CM | POA: Diagnosis present

## 2019-04-25 DIAGNOSIS — C782 Secondary malignant neoplasm of pleura: Secondary | ICD-10-CM | POA: Diagnosis present

## 2019-04-25 DIAGNOSIS — Z794 Long term (current) use of insulin: Secondary | ICD-10-CM

## 2019-04-25 DIAGNOSIS — I10 Essential (primary) hypertension: Secondary | ICD-10-CM | POA: Diagnosis present

## 2019-04-25 DIAGNOSIS — C770 Secondary and unspecified malignant neoplasm of lymph nodes of head, face and neck: Secondary | ICD-10-CM | POA: Diagnosis present

## 2019-04-25 DIAGNOSIS — R05 Cough: Secondary | ICD-10-CM | POA: Diagnosis not present

## 2019-04-25 DIAGNOSIS — Z20822 Contact with and (suspected) exposure to covid-19: Secondary | ICD-10-CM | POA: Diagnosis present

## 2019-04-25 DIAGNOSIS — R6510 Systemic inflammatory response syndrome (SIRS) of non-infectious origin without acute organ dysfunction: Secondary | ICD-10-CM | POA: Diagnosis not present

## 2019-04-25 DIAGNOSIS — Y95 Nosocomial condition: Secondary | ICD-10-CM | POA: Diagnosis present

## 2019-04-25 DIAGNOSIS — R0602 Shortness of breath: Secondary | ICD-10-CM | POA: Diagnosis not present

## 2019-04-25 DIAGNOSIS — C3490 Malignant neoplasm of unspecified part of unspecified bronchus or lung: Secondary | ICD-10-CM | POA: Diagnosis present

## 2019-04-25 DIAGNOSIS — E1165 Type 2 diabetes mellitus with hyperglycemia: Secondary | ICD-10-CM | POA: Diagnosis present

## 2019-04-25 DIAGNOSIS — Z823 Family history of stroke: Secondary | ICD-10-CM | POA: Diagnosis not present

## 2019-04-25 DIAGNOSIS — R6520 Severe sepsis without septic shock: Secondary | ICD-10-CM

## 2019-04-25 DIAGNOSIS — Z923 Personal history of irradiation: Secondary | ICD-10-CM | POA: Diagnosis not present

## 2019-04-25 DIAGNOSIS — Z6841 Body Mass Index (BMI) 40.0 and over, adult: Secondary | ICD-10-CM | POA: Diagnosis not present

## 2019-04-25 LAB — RESPIRATORY PANEL BY RT PCR (FLU A&B, COVID)
Influenza A by PCR: NEGATIVE
Influenza B by PCR: NEGATIVE
SARS Coronavirus 2 by RT PCR: NEGATIVE

## 2019-04-25 LAB — FERRITIN: Ferritin: 268 ng/mL (ref 24–336)

## 2019-04-25 LAB — C-REACTIVE PROTEIN: CRP: 23.4 mg/dL — ABNORMAL HIGH (ref <1.0)

## 2019-04-25 LAB — COMPREHENSIVE METABOLIC PANEL
ALT: 92 U/L — ABNORMAL HIGH (ref 0–44)
AST: 43 U/L — ABNORMAL HIGH (ref 15–41)
Albumin: 3.2 g/dL — ABNORMAL LOW (ref 3.5–5.0)
Alkaline Phosphatase: 58 U/L (ref 38–126)
Anion gap: 8 (ref 5–15)
BUN: 22 mg/dL (ref 8–23)
CO2: 25 mmol/L (ref 22–32)
Calcium: 8.5 mg/dL — ABNORMAL LOW (ref 8.9–10.3)
Chloride: 102 mmol/L (ref 98–111)
Creatinine, Ser: 1.06 mg/dL (ref 0.61–1.24)
GFR calc Af Amer: >60 mL/min (ref >60)
GFR calc non Af Amer: >60 mL/min (ref >60)
Glucose, Bld: 274 mg/dL — ABNORMAL HIGH (ref 70–99)
Potassium: 4.2 mmol/L (ref 3.5–5.1)
Sodium: 135 mmol/L (ref 135–145)
Total Bilirubin: 2.8 mg/dL — ABNORMAL HIGH (ref 0.3–1.2)
Total Protein: 6.1 g/dL — ABNORMAL LOW (ref 6.5–8.1)

## 2019-04-25 LAB — CBC WITH DIFFERENTIAL/PLATELET
Abs Immature Granulocytes: 0.06 10*3/uL (ref 0.00–0.07)
Basophils Absolute: 0 10*3/uL (ref 0.0–0.1)
Basophils Relative: 0 %
Eosinophils Absolute: 0 10*3/uL (ref 0.0–0.5)
Eosinophils Relative: 0 %
HCT: 39.2 % (ref 39.0–52.0)
Hemoglobin: 13.2 g/dL (ref 13.0–17.0)
Immature Granulocytes: 1 %
Lymphocytes Relative: 3 %
Lymphs Abs: 0.2 10*3/uL — ABNORMAL LOW (ref 0.7–4.0)
MCH: 29.8 pg (ref 26.0–34.0)
MCHC: 33.7 g/dL (ref 30.0–36.0)
MCV: 88.5 fL (ref 80.0–100.0)
Monocytes Absolute: 0.3 10*3/uL (ref 0.1–1.0)
Monocytes Relative: 5 %
Neutro Abs: 5.2 10*3/uL (ref 1.7–7.7)
Neutrophils Relative %: 91 %
Platelets: 112 10*3/uL — ABNORMAL LOW (ref 150–400)
RBC: 4.43 MIL/uL (ref 4.22–5.81)
RDW: 13.9 % (ref 11.5–15.5)
WBC: 5.8 10*3/uL (ref 4.0–10.5)
nRBC: 0 % (ref 0.0–0.2)

## 2019-04-25 LAB — HIV ANTIBODY (ROUTINE TESTING W REFLEX): HIV Screen 4th Generation wRfx: NONREACTIVE

## 2019-04-25 LAB — GLUCOSE, CAPILLARY
Glucose-Capillary: 279 mg/dL — ABNORMAL HIGH (ref 70–99)
Glucose-Capillary: 349 mg/dL — ABNORMAL HIGH (ref 70–99)
Glucose-Capillary: 320 mg/dL — ABNORMAL HIGH (ref 70–99)
Glucose-Capillary: 413 mg/dL — ABNORMAL HIGH (ref 70–99)

## 2019-04-25 LAB — NOVEL CORONAVIRUS, NAA: SARS-CoV-2, NAA: NOT DETECTED

## 2019-04-25 LAB — D-DIMER, QUANTITATIVE: D-Dimer, Quant: 1.62 ug/mL-FEU — ABNORMAL HIGH (ref 0.00–0.50)

## 2019-04-25 LAB — ABO/RH: ABO/RH(D): A NEG

## 2019-04-25 MED ORDER — INSULIN ASPART 100 UNIT/ML ~~LOC~~ SOLN
0.0000 [IU] | Freq: Three times a day (TID) | SUBCUTANEOUS | Status: DC
  Administered 2019-04-25: 11 [IU] via SUBCUTANEOUS
  Administered 2019-04-25: 8 [IU] via SUBCUTANEOUS
  Administered 2019-04-25 – 2019-04-26 (×3): 11 [IU] via SUBCUTANEOUS

## 2019-04-25 MED ORDER — ASCORBIC ACID 500 MG PO TABS
500.0000 mg | ORAL_TABLET | Freq: Every day | ORAL | Status: DC
  Administered 2019-04-25 – 2019-04-28 (×4): 500 mg via ORAL
  Filled 2019-04-25 (×4): qty 1

## 2019-04-25 MED ORDER — LEVOTHYROXINE SODIUM 100 MCG PO TABS
100.0000 ug | ORAL_TABLET | Freq: Every day | ORAL | Status: DC
  Administered 2019-04-26 – 2019-04-28 (×3): 100 ug via ORAL
  Filled 2019-04-25 (×3): qty 1

## 2019-04-25 MED ORDER — SODIUM CHLORIDE 0.9 % IV SOLN
2.0000 g | Freq: Three times a day (TID) | INTRAVENOUS | Status: DC
  Administered 2019-04-25 – 2019-04-28 (×9): 2 g via INTRAVENOUS
  Filled 2019-04-25 (×9): qty 2

## 2019-04-25 MED ORDER — ONDANSETRON HCL 4 MG/2ML IJ SOLN
4.0000 mg | Freq: Four times a day (QID) | INTRAMUSCULAR | Status: DC | PRN
  Administered 2019-04-25: 4 mg via INTRAVENOUS
  Filled 2019-04-25: qty 2

## 2019-04-25 MED ORDER — VANCOMYCIN HCL 1250 MG/250ML IV SOLN
1250.0000 mg | Freq: Two times a day (BID) | INTRAVENOUS | Status: DC
  Administered 2019-04-25 – 2019-04-27 (×5): 1250 mg via INTRAVENOUS
  Filled 2019-04-25 (×5): qty 250

## 2019-04-25 MED ORDER — ALBUTEROL SULFATE HFA 108 (90 BASE) MCG/ACT IN AERS: 1.0000 | INHALATION_SPRAY | RESPIRATORY_TRACT | Status: DC | PRN

## 2019-04-25 MED ORDER — VANCOMYCIN HCL 2000 MG/400ML IV SOLN
2000.0000 mg | Freq: Once | INTRAVENOUS | Status: AC
  Administered 2019-04-25: 06:00:00 2000 mg via INTRAVENOUS
  Filled 2019-04-25: qty 400

## 2019-04-25 MED ORDER — LOSARTAN POTASSIUM 50 MG PO TABS
50.0000 mg | ORAL_TABLET | Freq: Every day | ORAL | Status: DC
  Administered 2019-04-25 – 2019-04-28 (×4): 50 mg via ORAL
  Filled 2019-04-25 (×4): qty 1

## 2019-04-25 MED ORDER — IOHEXOL 350 MG/ML SOLN
100.0000 mL | Freq: Once | INTRAVENOUS | Status: AC | PRN
  Administered 2019-04-25: 11:00:00 100 mL via INTRAVENOUS

## 2019-04-25 MED ORDER — ENOXAPARIN SODIUM 40 MG/0.4ML ~~LOC~~ SOLN: 40.0000 mg | SUBCUTANEOUS | Status: DC

## 2019-04-25 MED ORDER — INSULIN GLARGINE 100 UNIT/ML ~~LOC~~ SOLN
30.0000 [IU] | Freq: Two times a day (BID) | SUBCUTANEOUS | Status: DC
  Administered 2019-04-25 – 2019-04-26 (×3): 30 [IU] via SUBCUTANEOUS
  Filled 2019-04-25 (×6): qty 0.3

## 2019-04-25 MED ORDER — ONDANSETRON HCL 4 MG PO TABS: 4.0000 mg | ORAL_TABLET | Freq: Four times a day (QID) | ORAL | Status: DC | PRN

## 2019-04-25 MED ORDER — METOPROLOL SUCCINATE ER 50 MG PO TB24
100.0000 mg | ORAL_TABLET | Freq: Every day | ORAL | Status: DC
  Administered 2019-04-25 – 2019-04-28 (×4): 100 mg via ORAL
  Filled 2019-04-25 (×4): qty 2

## 2019-04-25 MED ORDER — ALBUTEROL SULFATE (2.5 MG/3ML) 0.083% IN NEBU: 2.5000 mg | INHALATION_SOLUTION | RESPIRATORY_TRACT | Status: DC | PRN

## 2019-04-25 MED ORDER — ZINC SULFATE 220 (50 ZN) MG PO CAPS
220.0000 mg | ORAL_CAPSULE | Freq: Every day | ORAL | Status: DC
  Administered 2019-04-25 – 2019-04-28 (×4): 220 mg via ORAL
  Filled 2019-04-25 (×4): qty 1

## 2019-04-25 MED ORDER — INSULIN ASPART 100 UNIT/ML ~~LOC~~ SOLN
0.0000 [IU] | Freq: Every day | SUBCUTANEOUS | Status: DC
  Administered 2019-04-25: 5 [IU] via SUBCUTANEOUS

## 2019-04-25 MED ORDER — INSULIN ASPART 100 UNIT/ML ~~LOC~~ SOLN
10.0000 [IU] | Freq: Once | SUBCUTANEOUS | Status: AC
  Administered 2019-04-25: 10 [IU] via SUBCUTANEOUS

## 2019-04-25 MED ORDER — POLYETHYLENE GLYCOL 3350 17 G PO PACK
17.0000 g | PACK | Freq: Every day | ORAL | Status: DC | PRN
  Administered 2019-04-26: 17 g via ORAL
  Filled 2019-04-25 (×2): qty 1

## 2019-04-25 NOTE — Progress Notes (Signed)
PT ambulated > 500 ft with front wheeled walker for stability and standby assist. Tolerated well, SaO2 remained > 95% on room air while ambulating, Heart rate remained 88-89. No c/o SOB, Chest pain or weakness. Tolerated well. Back to bed, remains on room air.

## 2019-04-25 NOTE — Progress Notes (Signed)
PROGRESS NOTE    Steven Ibarra  WOE:[id number] DOB: 08-Jan-1956 DOA: 04/24/2019 PCP: Mosie Lukes, MD     Brief Narrative:  As per H&P written by Dr. Denton Brick on 04/24/2019  64 y.o. male with medical history significant for lung cancer with metastasis, hypertension and diabetes mellitus.  Patient presented to the ED with complaints of nausea vomiting of 2 days duration.  Also reports poor p.o. intake and generalized weakness.  At baseline he has difficulty breathing but he reports this is unchanged.  He denies dysuria, he denies abdominal pain.  ED Course: Febrile to 103.4, tachycardic to 130s, O2 sats initially greater than 92% on room air.  Then patient had an episode of shaking with tachycardia, and with ambulation O2 sats dropped to 87% on room air he was placed on 4 L O2.  Lactic acid 2.2 >> 2.6.  POC Covid test negative.  Chest x-ray negative for acute abnormality, abdominal CT with contrast- cholelithiasis, left adrenal mass. Hospitalist admit for further evaluation and management.  Assessment & Plan: 1-sepsis in the setting of postobstructive pneumonia -Patient immunocompromise (actively receiving immunotherapy and chemotherapy) -Follow culture results -Continue IV broad-spectrum antibiotics -Follow vital signs -If cultures negative and afebrile after 48 hours antibiotics will be able to transition to oral route (may be Levaquin) to complete a total of 8 days. -Continue as needed duo nebs -Follow clinical response.  2-type 2 diabetes mellitus -Continue holding oral hypoglycemic agents -Continue sliding scale insulin and Lantus -Hyperglycemia most likely associated with initial use of his steroids -Will discontinue steroids at this time.  3-dehydration/hyponatremia/hypokalemia -Continue IV fluids and advance diet -Follow electrolytes and renal function -No further episode of nausea or vomiting currently.  4-hypertension -Continue holding chlorthalidone for  now -Continue losartan and metoprolol -Vital signs stable.  5-hypothyroidism -Continue Synthroid  6-history of lung cancer -Continue patient follow-up with oncology service at [facility] -Patient is actively receiving chemotherapy treatment. -Based on CT chest results will benefit of repeat PET scan at follow-up visit.  7-acute respiratory failure with hypoxia -In the setting of postobstructive pneumonia and underlying history of lung cancer -Continue treatment as mentioned above -Wean oxygen supplementation as tolerated -Patient reports some underlying history of shortness of breath especially with activity, but was no using oxygen prior to admission.  DVT prophylaxis: Lovenox Code Status: Full code. Family Communication: No family at bedside Disposition Plan: Follow culture results, continue IV antibiotics, stop steroids.  Patient negative for Covid.  Consultants:   None  Procedures:   See below for x-ray reports  Antimicrobials:  Anti-infectives (From admission, onward)   Start     Dose/Rate Route Frequency Ordered Stop   04/25/19 1200  vancomycin (VANCOREADY) IVPB 1250 mg/250 mL     1,250 mg 166.7 mL/hr over 90 Minutes Intravenous Every 12 hours 04/25/19 1000     04/25/19 1000  ceFEPIme (MAXIPIME) 2 g in sodium chloride 0.9 % 100 mL IVPB     2 g 200 mL/hr over 30 Minutes Intravenous Every 8 hours 04/25/19 0957     04/25/19 0215  vancomycin (VANCOREADY) IVPB 2000 mg/400 mL     2,000 mg 200 mL/hr over 120 Minutes Intravenous  Once 04/25/19 0206 04/25/19 0736   04/24/19 2345  ceFEPIme (MAXIPIME) 2 g in sodium chloride 0.9 % 100 mL IVPB     2 g 200 mL/hr over 30 Minutes Intravenous  Once 04/24/19 2341 04/25/19 0126   04/24/19 2345  vancomycin (VANCOCIN) IVPB 1000 mg/200 mL premix  Status:  Discontinued     1,000 mg 200 mL/hr over 60 Minutes Intravenous  Once 04/24/19 2344 04/25/19 0206   04/24/19 2330  metroNIDAZOLE (FLAGYL) IVPB 500 mg     500 mg 100 mL/hr over 60  Minutes Intravenous Every 8 hours 04/24/19 2330        Subjective: Currently afebrile; no chest pain, no nausea, no vomiting.  Reports breathing is better.  Objective: Vitals:   04/25/19 0529 04/25/19 0531 04/25/19 0954 04/25/19 1337  BP:  (!) 158/86 (!) 144/89 (!) 169/99  Pulse: 80 79 85 78  Resp:    20  Temp:    98 F (36.7 C)  TempSrc:    Oral  SpO2: 97% 98%  98%  Weight:      Height:        Intake/Output Summary (Last 24 hours) at 04/25/2019 1718 Last data filed at 04/25/2019 1706 Gross per 24 hour  Intake 1940.97 ml  Output 2550 ml  Net -609.03 ml   Filed Weights   04/25/19 0203 04/25/19 0300  Weight: 136.1 kg (!) 146.1 kg    Examination: General exam: Alert, awake, oriented x 3, no chest pain, no abdominal pain, no nausea, no vomiting.  Patient reports breathing is better and is currently afebrile. Respiratory system: Positive scattered rhonchi at, decreased breath sounds at the bases; no wheezing, no crackles.   Cardiovascular system: RRR. No murmurs, rubs, gallops.  No JVD. Gastrointestinal system: Abdomen is nondistended, soft and nontender. No organomegaly or masses felt. Normal bowel sounds heard. Central nervous system: Alert and oriented. No focal neurological deficits. Extremities: No C/C/E, +pedal pulses Skin: No rashes, lesions or ulcers Psychiatry: Judgement and insight appear normal. Mood & affect appropriate.     Data Reviewed: I have personally reviewed following labs and imaging studies  CBC: Recent Labs  Lab 04/24/19 1710 04/25/19 0252  WBC 6.0 5.8  NEUTROABS 5.6 5.2  HGB 13.6 13.2  HCT 39.4 39.2  MCV 87.8 88.5  PLT 117* 169*   Basic Metabolic Panel: Recent Labs  Lab 04/24/19 1710 04/25/19 0252  NA 129* 135  K 3.4* 4.2  CL 95* 102  CO2 25 25  GLUCOSE 310* 274*  BUN 23 22  CREATININE 1.15 1.06  CALCIUM 9.2 8.5*   GFR: Estimated Creatinine Clearance: 111.5 mL/min (by C-G formula based on SCr of 1.06 mg/dL).   Liver Function  Tests: Recent Labs  Lab 04/24/19 1710 04/25/19 0252  AST 55* 43*  ALT 108* 92*  ALKPHOS 58 58  BILITOT 2.0* 2.8*  PROT 6.4* 6.1*  ALBUMIN 3.3* 3.2*   Recent Labs  Lab 04/24/19 1710  LIPASE 16   CBG: Recent Labs  Lab 04/25/19 0718 04/25/19 1109 04/25/19 1625  GLUCAP 279* 349* 320*   Lipid Profile: Recent Labs    04/24/19 1710  TRIG 228*   Anemia Panel: Recent Labs    04/24/19 1710 04/25/19 0252  FERRITIN 232 268   Urine analysis:    Component Value Date/Time   COLORURINE YELLOW 03/25/2010 2030   APPEARANCEUR CLEAR 03/25/2010 2030   LABSPEC 1.020 03/25/2010 2030   PHURINE 5.0 03/25/2010 2030   GLUCOSEU >1000 (A) 03/25/2010 2030   HGBUR NEGATIVE 03/25/2010 2030   BILIRUBINUR neg 09/25/2011 0844   KETONESUR 15 (A) 03/25/2010 2030   PROTEINUR neg 09/25/2011 0844   PROTEINUR NEGATIVE 03/25/2010 2030   UROBILINOGEN 0.2 09/25/2011 0844   UROBILINOGEN 0.2 03/25/2010 2030   NITRITE neg 09/25/2011 0844   NITRITE NEGATIVE 03/25/2010 2030  LEUKOCYTESUR Negative 09/25/2011 0844    Recent Results (from the past 240 hour(s))  Novel Coronavirus, NAA ([facility])     Status: None   Collection Time: 04/24/19  9:49 AM   Specimen: Nasopharyngeal(NP) swabs in vial transport medium   NASOPHARYNGE  TESTING  Result Value Ref Range Status   SARS-CoV-2, NAA Not Detected Not Detected Final    Comment: This nucleic acid amplification test was developed and its performance characteristics determined by [employer]. Nucleic acid amplification tests include RT-PCR and TMA. This test has not been FDA cleared or approved. This test has been authorized by FDA under an Emergency Use Authorization (EUA). This test is only authorized for the duration of time the declaration that circumstances exist justifying the authorization of the emergency use of in vitro diagnostic tests for detection of SARS-CoV-2 virus and/or diagnosis of COVID-19 infection under section 564(b)(1) of  the Act, 21 U.S.C. 762GBT-5(V) (1), unless the authorization is terminated or revoked sooner. When diagnostic testing is negative, the possibility of a false negative result should be considered in the context of a patient's recent exposures and the presence of clinical signs and symptoms consistent with COVID-19. An individual without symptoms of COVID-19 and who is not shedding SARS-CoV-2 virus wo uld expect to have a negative (not detected) result in this assay.   Blood Culture (routine x 2)     Status: None (Preliminary result)   Collection Time: 04/24/19  9:39 PM   Specimen: BLOOD  Result Value Ref Range Status   Specimen Description BLOOD RIGHT ANTECUBITAL  Final   Special Requests   Final    BOTTLES DRAWN AEROBIC AND ANAEROBIC Blood Culture adequate volume   Culture   Final    NO GROWTH < 12 HOURS Performed at [facility], [address], [place], [place] [address]    Report Status PENDING  Incomplete  Blood Culture (routine x 2)     Status: None (Preliminary result)   Collection Time: 04/24/19  9:48 PM   Specimen: BLOOD  Result Value Ref Range Status   Specimen Description BLOOD RIGHT ANTECUBITAL  Final   Special Requests   Final    BOTTLES DRAWN AEROBIC AND ANAEROBIC Blood Culture adequate volume   Culture   Final    NO GROWTH < 12 HOURS Performed at [facility], [address], [place], [place] [address]    Report Status PENDING  Incomplete  Respiratory Panel by RT PCR (Flu A&B, Covid) - Nasopharyngeal Swab     Status: None   Collection Time: 04/25/19 12:01 AM   Specimen: Nasopharyngeal Swab  Result Value Ref Range Status   SARS Coronavirus 2 by RT PCR NEGATIVE NEGATIVE Final    Comment: (NOTE) SARS-CoV-2 target nucleic acids are NOT DETECTED. The SARS-CoV-2 RNA is generally detectable in upper respiratoy specimens during the acute phase of infection. The lowest concentration of SARS-CoV-2 viral copies this assay can detect is 131 copies/mL. A negative result  does not preclude SARS-Cov-2 infection and should not be used as the sole basis for treatment or other patient management decisions. A negative result may occur with  improper specimen collection/handling, submission of specimen other than nasopharyngeal swab, presence of viral mutation(s) within the areas targeted by this assay, and inadequate number of viral copies (<131 copies/mL). A negative result must be combined with clinical observations, patient history, and epidemiological information. The expected result is Negative. Fact Sheet for Patients:  PinkCheek.be Fact Sheet for Healthcare Providers:  GravelBags.it This test is not  yet ap proved or cleared by the [place] and  has been authorized for detection and/or diagnosis of SARS-CoV-2 by FDA under an Emergency Use Authorization (EUA). This EUA will remain  in effect (meaning this test can be used) for the duration of the COVID-19 declaration under Section 564(b)(1) of the Act, 21 U.S.C. section 360bbb-3(b)(1), unless the authorization is terminated or revoked sooner.    Influenza A by PCR NEGATIVE NEGATIVE Final   Influenza B by PCR NEGATIVE NEGATIVE Final    Comment: (NOTE) The Xpert Xpress SARS-CoV-2/FLU/RSV assay is intended as an aid in  the diagnosis of influenza from Nasopharyngeal swab specimens and  should not be used as a sole basis for treatment. Nasal washings and  aspirates are unacceptable for Xpert Xpress SARS-CoV-2/FLU/RSV  testing. Fact Sheet for Patients: PinkCheek.be Fact Sheet for Healthcare Providers: GravelBags.it This test is not yet approved or cleared by the [place] FDA and  has been authorized for detection and/or diagnosis of SARS-CoV-2 by  FDA under an Emergency Use Authorization (EUA). This EUA will remain  in effect (meaning this test can be used) for the duration of  the  Covid-19 declaration under Section 564(b)(1) of the Act, 21  U.S.C. section 360bbb-3(b)(1), unless the authorization is  terminated or revoked. Performed at [facility], [address] [place], [place] [address]       Radiology Studies: CT ANGIO CHEST PE W OR WO CONTRAST  Result Date: 04/25/2019 CLINICAL DATA:  Metastatic lung cancer, difficulty breathing/shortness of breath, nausea/vomiting x2 days, elevated D-dimer EXAM: CT ANGIOGRAPHY CHEST WITH CONTRAST TECHNIQUE: Multidetector CT imaging of the chest was performed using the standard protocol during bolus administration of intravenous contrast. Multiplanar CT image reconstructions and MIPs were obtained to evaluate the vascular anatomy. CONTRAST:  148mL OMNIPAQUE IOHEXOL 350 MG/ML SOLN COMPARISON:  CT abdomen/pelvis dated 04/24/2019. CT chest dated 06/18/2016. FINDINGS: Cardiovascular: Satisfactory opacification the bilateral pulmonary arteries to the lobar level. No evidence of pulmonary embolism. No evidence of thoracic aortic aneurysm or dissection. Mild atherosclerotic calcifications aortic root. The heart is normal in size. No pericardial effusion. Mitral valve annular calcifications. Coronary atherosclerosis the LAD and left circumflex. Mediastinum/Nodes: Small mediastinal lymph nodes, including a 9 mm short axis right paratracheal node and 8 mm short axis subcarinal node, within the upper limits of normal. Visualized left thyroid is unremarkable. Right thyroid is not discretely visualized and may be surgically absent. Lungs/Pleura: Radiation changes in the right upper lobe/suprahilar region. Radiation changes in the left lower lobe. Hyperdense nodular lesion at the left lung apex (series 5/image 34), new from the prior, possibly reflecting talc pleurodesis. However, there is nodular pleural thickening which is a new/progressive from the prior, for example, overlying the lateral left upper lobe/lingula, measuring approximately 7.9 x 2.3  cm (series 4/image 61). Additional 15 mm nodule along the left hemidiaphragm (series 4/image 83) and 2.0 x 4.9 cm nodule along the left hemidiaphragm at the left lung base (series 4/image 89), new/progressive. Additional chest wall nodularity, some of which involve the ribs and are described below. However, there is also a 2.2 x 2.9 cm lesion at the left 9/10 rib interspace (series 4/image 98) and a 1.4 x 2.3 cm lesion at the left 11/12 rib interspace (series 4/image 102) in the posterior chest wall. Upper Abdomen: Visualized upper abdomen is notable for layering gallstones (series 5/image 308), a 1.8 cm medial right upper pole renal cyst, and a 2.0 cm left adrenal nodule which is chronic and favors  a benign adrenal adenoma. Musculoskeletal: Old left anterior 5th rib fracture. Abnormal underlying pleural-based tumor in the left lateral chest wall (series 5/image 179). Additional old left rib fracture deformities. 3.6 x 8.3 cm left chest wall mass with metastasis involving the left anterior 6th rib (series 5/image 225). 3.1 x 5.4 cm left lateral chest wall mass with metastasis involving the left lateral 7th rib (series 5/image 258). Degenerative changes of the thoracic spine. Review of the MIP images confirms the above findings. IMPRESSION: No evidence of pulmonary embolism. 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 Small mediastinal lymph nodes, within the upper limits of normal. Left adrenal nodule, chronic, favoring a benign adrenal adenoma. Consider PET-CT for further evaluation as clinically warranted. Aortic Atherosclerosis (ICD10-I70.0). Electronically Signed   By: Julian Hy M.D.   On: 04/25/2019 11:29   CT Abdomen Pelvis W Contrast  Result Date: 04/24/2019 CLINICAL DATA:  Nausea and vomiting x2 days. EXAM: CT ABDOMEN AND PELVIS WITH CONTRAST TECHNIQUE: Multidetector CT imaging of the abdomen and pelvis was performed using the standard protocol following  bolus administration of intravenous contrast. CONTRAST:  18mL OMNIPAQUE IOHEXOL 300 MG/ML  SOLN COMPARISON:  Chest CT, dated June 18, 2016, is available for comparison. FINDINGS: Lower chest: Mild atelectasis is seen within the left lung base. A stable 1.5 cm noncalcified lung nodule (approximately 3.04 Hounsfield units) is seen within the posteromedial aspect of the left lower lobe (axial CT image 20, CT series number 2). Hepatobiliary: No focal liver abnormality is seen. Subcentimeter gallstones are seen within the lumen of an otherwise normal-appearing gallbladder. Pancreas: Unremarkable. No pancreatic ductal dilatation or surrounding inflammatory changes. Spleen: Normal in size without focal abnormality. Adrenals/Urinary Tract: The right adrenal gland is normal in size and appearance. A stable 2.4 cm x 1.9 cm isodense left adrenal mass is seen. Kidneys are normal in size, without renal calculi or hydronephrosis. Several right renal cysts are seen. The largest measures approximately 3.2 cm x 2.3 cm and is seen along the lower pole of the right kidney. Bladder is unremarkable. Stomach/Bowel: There is a small hiatal hernia. Appendix appears normal. No evidence of bowel wall thickening, distention, or inflammatory changes. Vascular/Lymphatic: Mild aortic atherosclerosis. No enlarged abdominal or pelvic lymph nodes. Reproductive: The prostate gland is mildly enlarged. Other: No abdominal wall hernia or abnormality. No abdominopelvic ascites. Musculoskeletal: Chronic fifth, sixth and seventh left rib fractures are seen. Multilevel degenerative changes seen throughout the lumbar spine. IMPRESSION: 1. Cholelithiasis. 2. Stable left adrenal mass which may represent an adrenal adenoma. 3. Stable 1.5 cm noncalcified left lower lobe lung nodule. 4. Small hiatal hernia. Electronically Signed   By: Virgina Norfolk M.D.   On: 04/24/2019 20:08   DG CHEST PORT 1 VIEW  Result Date: 04/25/2019 CLINICAL DATA:  64 year old  male with history of acute respiratory failure. Cough. EXAM: PORTABLE CHEST 1 VIEW COMPARISON:  Chest x-ray 04/24/2019. FINDINGS: Partially loculated small to moderate left pleural effusion appears slightly increased compared to the prior study, with worsening left basilar opacity which may reflect increasing atelectasis and/or consolidation in the left lower lobe. Linear opacity in the right mid lung likely to represent some developing subsegmental atelectasis. No right pleural effusion. No evidence of pulmonary edema. Heart size is normal. Upper mediastinal contours are within normal limits. IMPRESSION: 1. Enlarging partially loculated small to moderate left pleural effusion with worsening atelectasis and/or consolidation in the left lower lobe. 2. Increasing subsegmental atelectasis in the right mid lung. Electronically Signed  By: Vinnie Langton M.D.   On: 04/25/2019 09:30   DG Chest Portable 1 View  Result Date: 04/24/2019 CLINICAL DATA:  Cough for 2 days. EXAM: PORTABLE CHEST 1 VIEW COMPARISON:  04/29/2017 and prior radiographs FINDINGS: The cardiomediastinal silhouette is unremarkable. LEFT basilar and RIGHT perihilar atelectasis/scarring again noted. There is no evidence of focal airspace disease, pulmonary edema, suspicious pulmonary nodule/mass, pleural effusion, or pneumothorax. No acute bony abnormalities are identified. IMPRESSION: No active disease. Electronically Signed   By: Margarette Canada M.D.   On: 04/24/2019 17:30    Scheduled Meds: . vitamin C  500 mg Oral Daily  . dexamethasone (DECADRON) injection  6 mg Intravenous Q24H  . enoxaparin (LOVENOX) injection  40 mg Subcutaneous Q24H  . insulin aspart  0-15 Units Subcutaneous TID WC  . insulin aspart  0-5 Units Subcutaneous QHS  . insulin glargine  30 Units Subcutaneous BID  . losartan  50 mg Oral Daily  . metoprolol succinate  100 mg Oral Daily  . zinc sulfate  220 mg Oral Daily   Continuous Infusions: . ceFEPime (MAXIPIME) IV 2 g  (04/25/19 1221)  . metronidazole 500 mg (04/25/19 1706)  . vancomycin 1,250 mg (04/25/19 1513)     LOS: 0 days    Time spent: 35 minutes. Greater than 50% of this time was spent in direct contact with the patient, coordinating care and discussing relevant ongoing clinical issues, including concern for postobstructive pneumonia in the setting of immunosuppression from immunotherapy/chemotherapy history.  Currently afebrile, no having any pain and expressing stable breathing.  Covid test is negative.  CT angiogram demonstrated no pulmonary embolism, but has confusion concerning for postobstructive pneumonia with widespread left hemithorax pleural and lung base malignancy.    Barton Dubois, MD [facility] Pager [phone]  If 7PM-7AM, please contact night-coverage [email or web address] Password [facility] 04/25/2019, 5:18 PM

## 2019-04-25 NOTE — Progress Notes (Signed)
Pharmacy Antibiotic Note  Steven Ibarra is a 64 y.o. male admitted on 04/24/2019 with sepsis.  Pharmacy has been consulted for Cefepime and Vancomycin dosing.  Plan: Vancomycin 2000mg  loading dose, then 1250mg   IV every 12 hours.  Goal trough 15-20 mcg/mL.  Cefepime 2gm IV q8h Also on flagyl 500mg  IV q8h F/U cxs and clinical progress Monitor V/S, labs and levels as indicated  Height: 6\' 3"  (190.5 cm) Weight: (!) 322 lb 1.5 oz (146.1 kg) IBW/kg (Calculated) : 84.5  Temp (24hrs), Avg:100.2 F (37.9 C), Min:97.6 F (36.4 C), Max:103.4 F (39.7 C)  Recent Labs  Lab 04/24/19 1710 04/24/19 1716 04/24/19 2148 04/24/19 2303 04/25/19 0252  WBC 6.0  --   --   --  5.8  CREATININE 1.15  --   --   --  1.06  LATICACIDVEN  --  2.2* 2.6* 1.3  --     Estimated Creatinine Clearance: 111.5 mL/min (by C-G formula based on SCr of 1.06 mg/dL).    No Known Allergies  Antimicrobials this admission: Cefepime 1/26 >>  Vancomycin 1/26 >>  Flagyl 1/26 >>  Dose adjustments this admission: Vanc/cefepime prn  Microbiology results: 1/25 BCx: pending 1/25 SARS-2 CV is negative  MRSA PCR:   Thank you for allowing pharmacy to be a part of this patient's care.  Isac Sarna, BS [place], [place] Clinical Pharmacist Pager [phone] 04/25/2019 10:23 AM

## 2019-04-26 DIAGNOSIS — J189 Pneumonia, unspecified organism: Secondary | ICD-10-CM

## 2019-04-26 LAB — URINALYSIS, ROUTINE W REFLEX MICROSCOPIC
Bacteria, UA: NONE SEEN
Bilirubin Urine: NEGATIVE
Glucose, UA: >=500 mg/dL — AB
Hgb urine dipstick: NEGATIVE
Ketones, ur: NEGATIVE mg/dL
Leukocytes,Ua: NEGATIVE
Nitrite: NEGATIVE
Protein, ur: NEGATIVE mg/dL
Specific Gravity, Urine: 1.025 (ref 1.005–1.030)
pH: 5 (ref 5.0–8.0)

## 2019-04-26 LAB — GLUCOSE, CAPILLARY
Glucose-Capillary: 301 mg/dL — ABNORMAL HIGH (ref 70–99)
Glucose-Capillary: 314 mg/dL — ABNORMAL HIGH (ref 70–99)
Glucose-Capillary: 269 mg/dL — ABNORMAL HIGH (ref 70–99)
Glucose-Capillary: 249 mg/dL — ABNORMAL HIGH (ref 70–99)

## 2019-04-26 LAB — COMPREHENSIVE METABOLIC PANEL
ALT: 72 U/L — ABNORMAL HIGH (ref 0–44)
AST: 31 U/L (ref 15–41)
Albumin: 2.9 g/dL — ABNORMAL LOW (ref 3.5–5.0)
Alkaline Phosphatase: 68 U/L (ref 38–126)
Anion gap: 9 (ref 5–15)
BUN: 27 mg/dL — ABNORMAL HIGH (ref 8–23)
CO2: 25 mmol/L (ref 22–32)
Calcium: 8.4 mg/dL — ABNORMAL LOW (ref 8.9–10.3)
Chloride: 100 mmol/L (ref 98–111)
Creatinine, Ser: 0.98 mg/dL (ref 0.61–1.24)
GFR calc Af Amer: >60 mL/min (ref >60)
GFR calc non Af Amer: >60 mL/min (ref >60)
Glucose, Bld: 236 mg/dL — ABNORMAL HIGH (ref 70–99)
Potassium: 3.6 mmol/L (ref 3.5–5.1)
Sodium: 134 mmol/L — ABNORMAL LOW (ref 135–145)
Total Bilirubin: 1.2 mg/dL (ref 0.3–1.2)
Total Protein: 5.9 g/dL — ABNORMAL LOW (ref 6.5–8.1)

## 2019-04-26 LAB — HEMOGLOBIN A1C
Hgb A1c MFr Bld: 8.1 % — ABNORMAL HIGH (ref 4.8–5.6)
Mean Plasma Glucose: 185.77 mg/dL

## 2019-04-26 LAB — CBC WITH DIFFERENTIAL/PLATELET
Abs Immature Granulocytes: 0.07 10*3/uL (ref 0.00–0.07)
Basophils Absolute: 0 10*3/uL (ref 0.0–0.1)
Basophils Relative: 0 %
Eosinophils Absolute: 0.1 10*3/uL (ref 0.0–0.5)
Eosinophils Relative: 2 %
HCT: 39.4 % (ref 39.0–52.0)
Hemoglobin: 13.3 g/dL (ref 13.0–17.0)
Immature Granulocytes: 1 %
Lymphocytes Relative: 5 %
Lymphs Abs: 0.3 10*3/uL — ABNORMAL LOW (ref 0.7–4.0)
MCH: 29.4 pg (ref 26.0–34.0)
MCHC: 33.8 g/dL (ref 30.0–36.0)
MCV: 87.2 fL (ref 80.0–100.0)
Monocytes Absolute: 0.6 10*3/uL (ref 0.1–1.0)
Monocytes Relative: 10 %
Neutro Abs: 4.9 10*3/uL (ref 1.7–7.7)
Neutrophils Relative %: 82 %
Platelets: 134 10*3/uL — ABNORMAL LOW (ref 150–400)
RBC: 4.52 MIL/uL (ref 4.22–5.81)
RDW: 13.6 % (ref 11.5–15.5)
WBC: 5.9 10*3/uL (ref 4.0–10.5)
nRBC: 0 % (ref 0.0–0.2)

## 2019-04-26 MED ORDER — ENOXAPARIN SODIUM 80 MG/0.8ML ~~LOC~~ SOLN
70.0000 mg | SUBCUTANEOUS | Status: DC
  Administered 2019-04-26 – 2019-04-28 (×3): 70 mg via SUBCUTANEOUS
  Filled 2019-04-26 (×3): qty 0.8

## 2019-04-26 MED ORDER — INSULIN ASPART 100 UNIT/ML ~~LOC~~ SOLN
0.0000 [IU] | Freq: Three times a day (TID) | SUBCUTANEOUS | Status: DC
  Administered 2019-04-26: 11 [IU] via SUBCUTANEOUS
  Administered 2019-04-27 (×2): 7 [IU] via SUBCUTANEOUS
  Administered 2019-04-27 – 2019-04-28 (×2): 4 [IU] via SUBCUTANEOUS

## 2019-04-26 MED ORDER — INSULIN ASPART 100 UNIT/ML ~~LOC~~ SOLN
0.0000 [IU] | Freq: Every day | SUBCUTANEOUS | Status: DC
  Administered 2019-04-26 – 2019-04-27 (×2): 2 [IU] via SUBCUTANEOUS

## 2019-04-26 MED ORDER — INSULIN GLARGINE 100 UNIT/ML ~~LOC~~ SOLN
40.0000 [IU] | Freq: Two times a day (BID) | SUBCUTANEOUS | Status: DC
  Administered 2019-04-26 – 2019-04-28 (×4): 40 [IU] via SUBCUTANEOUS
  Filled 2019-04-26 (×10): qty 0.4

## 2019-04-26 NOTE — Progress Notes (Signed)
PROGRESS NOTE  Steven Ibarra FAO:[id number] DOB: 06/01/1955 DOA: 04/24/2019 PCP: Mosie Lukes, MD  Brief History:  64 year old male with a history of metastatic carcinoma status post VATS 09/09/2018 with parietal pleural biopsy presenting with poor oral intake, generalized weakness, nausea and vomiting x2 days.  The patient was noted to be febrile up to 103.4 F and tachycardic.  PCT was 16.77.  Lactic acid peaked at 2.6.  The patient himself has chronic shortness of breath.  He states that this was unchanged.  He denies any chest pain, coughing, hemoptysis, diarrhea, abdominal pain, dysuria.  CTA chest was negative for PE but shows widespread pleural-based and chest wall tumor in the left hemithorax.  There is upper limit of normal mediastinal lymph nodes.  There is a chronic left adrenal nodule.  CT of the abdomen and pelvis showed cholelithiasis with stable left adrenal mass and left lower lobe lung nodule.  The patient was started on vancomycin, cefepime, and metronidazole. nodular pleural thickening which is a new/progressive from the prior, for example, overlying the lateral left upper lobe/lingula, measuring approximately 7.9 x 2.3 cm  Assessment/Plan: Sepsis -Present on admission -secondary to pneumonia (HCAP) -Lactic acid 2.6 -Procalcitonin 16.77 -Continue IV vancomycin and cefepime -Follow blood cultures--neg to date -UA and urine culture were not obtained -repeat PCT -MRSA PCR  Acute respiratory failure with hypoxia -Initially stable on 2 L nasal cannula -Wean oxygen back to room air -04/25/2019 CTA chest--negative PE--widespread pleural-based and chest wall tumor in the left hemithorax -Certainly, findings on CT chest may represent postradiation changes versus metastatic cancer versus infectious process -In the setting of fever and tachycardia, plan to treat as pneumonia -SARS-CoV2 RT-PCR--neg  Metastatic adenocarcinoma -Mets to lymph nodes, pleura, lungs  with effusions -Not progressing to pleural as well as bones -s/p VATS chemical pleurodesis and nivolumab X5 -follows med onc at [facility] -Patient 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) -Plan was to start the patient on vismodegib   Diabetes mellitus type 2, uncontrolled with hyperglycemia -NovoLog sliding scale -12/27/2018 hemoglobin A1c 7.9 -holding metformin  Essential hypertension -Holding chlorthalidone -Continue losartan and metoprolol  Hypokalemia -Repleted  Hypothyroidism -Continue Synthroid  Hyperlipidemia -continue statin when able to tolerate po      Disposition Plan:   Home in 1-2 days  Family Communication:   Spouse updated at bedside 1/27  Consultants:  none  Code Status:  FULL   DVT Prophylaxis:  [place] Lovenox   Procedures: As Listed in Progress Note Above  Antibiotics: vanco 1/25>>> Cefepime 1/25>>> Metronidazole 1/25>>>     Total time spent 35 minutes.  Greater than 50% spent face to face counseling and coordinating care.   Subjective: Patient denies fevers, chills, headache, chest pain, dyspnea, nausea, vomiting, diarrhea, abdominal pain, dysuria, hematuria, hematochezia, and melena.   Objective: Vitals:   04/25/19 2032 04/26/19 0532 04/26/19 1026 04/26/19 1338  BP: (!) 169/94 136/68 116/60 116/71  Pulse: 75 70 79 68  Resp: 20 18 18 20   Temp: (!) 97.4 F (36.3 C) (!) 97.4 F (36.3 C)  (!) 97.5 F (36.4 C)  TempSrc: Oral Oral  Oral  SpO2: 97% 96% 94% 96%  Weight:      Height:        Intake/Output Summary (Last 24 hours) at 04/26/2019 1539 Last data filed at 04/26/2019 1300 Gross  per 24 hour  Intake 1160.45 ml  Output 2400 ml  Net -1239.55 ml   Weight change:  Exam:   General:  Pt is alert, follows commands appropriately, not in acute distress  HEENT: No icterus, No thrush, No neck mass, [place]/AT  Cardiovascular: RRR, S1/S2, no rubs, no gallops  Respiratory: bibasilar rales.  Nodules on left chest wall  Abdomen: Soft/+BS, non tender, non distended, no guarding  Extremities: 2=le edema, No lymphangitis, No petechiae, No rashes, no synovitis   Data Reviewed: I have personally reviewed following labs and imaging studies Basic Metabolic Panel: Recent Labs  Lab 04/24/19 1710 04/25/19 0252 04/26/19 0550  NA 129* 135 134*  K 3.4* 4.2 3.6  CL 95* 102 100  CO2 25 25 25   GLUCOSE 310* 274* 236*  BUN 23 22 27*  CREATININE 1.15 1.06 0.98  CALCIUM 9.2 8.5* 8.4*   Liver Function Tests: Recent Labs  Lab 04/24/19 1710 04/25/19 0252 04/26/19 0550  AST 55* 43* 31  ALT 108* 92* 72*  ALKPHOS 58 58 68  BILITOT 2.0* 2.8* 1.2  PROT 6.4* 6.1* 5.9*  ALBUMIN 3.3* 3.2* 2.9*   Recent Labs  Lab 04/24/19 1710  LIPASE 16   No results for input(s): AMMONIA in the last 168 hours. Coagulation Profile: No results for input(s): INR, PROTIME in the last 168 hours. CBC: Recent Labs  Lab 04/24/19 1710 04/25/19 0252 04/26/19 0550  WBC 6.0 5.8 5.9  NEUTROABS 5.6 5.2 4.9  HGB 13.6 13.2 13.3  HCT 39.4 39.2 39.4  MCV 87.8 88.5 87.2  PLT 117* 112* 134*   Cardiac Enzymes: No results for input(s): CKTOTAL, CKMB, CKMBINDEX, TROPONINI in the last 168 hours. BNP: Invalid input(s): POCBNP CBG: Recent Labs  Lab 04/25/19 1109 04/25/19 1625 04/25/19 2049 04/26/19 0821 04/26/19 1128  GLUCAP 349* 320* 413* 301* 314*   HbA1C: No results for input(s): HGBA1C in the last 72 hours. Urine analysis:    Component Value Date/Time   COLORURINE YELLOW 03/25/2010 2030   APPEARANCEUR CLEAR 03/25/2010 2030   LABSPEC 1.020 03/25/2010 2030   PHURINE 5.0 03/25/2010 2030    GLUCOSEU >1000 (A) 03/25/2010 2030   HGBUR NEGATIVE 03/25/2010 2030   BILIRUBINUR neg 09/25/2011 0844   KETONESUR 15 (A) 03/25/2010 2030   PROTEINUR neg 09/25/2011 0844   PROTEINUR NEGATIVE 03/25/2010 2030   UROBILINOGEN 0.2 09/25/2011 0844   UROBILINOGEN 0.2 03/25/2010 2030   NITRITE neg 09/25/2011 0844   NITRITE NEGATIVE 03/25/2010 2030   LEUKOCYTESUR Negative 09/25/2011 0844   Sepsis Labs: @LABRCNTIP (procalcitonin:4,lacticidven:4) ) Recent Results (from the past 240 hour(s))  Novel Coronavirus, NAA ([facility])     Status: None   Collection Time: 04/24/19  9:49 AM   Specimen: Nasopharyngeal(NP) swabs in vial transport medium   NASOPHARYNGE  TESTING  Result Value Ref Range Status   SARS-CoV-2, NAA Not Detected Not Detected Final    Comment: This nucleic acid amplification test was developed and its performance characteristics determined by [employer]. Nucleic acid amplification tests include RT-PCR and TMA. This test has not been FDA cleared or approved. This test has been authorized by FDA under an Emergency Use Authorization (EUA). This test is only authorized for the duration of time the declaration that circumstances exist justifying the authorization of the emergency use of in vitro diagnostic tests for detection of SARS-CoV-2  virus and/or diagnosis of COVID-19 infection under section 564(b)(1) of the Act, 21 U.S.C. 998PJA-2(N) (1), unless the authorization is terminated or revoked sooner. When diagnostic testing is negative, the possibility of a false negative result should be considered in the context of a patient's recent exposures and the presence of clinical signs and symptoms consistent with COVID-19. An individual without symptoms of COVID-19 and who is not shedding SARS-CoV-2 virus wo uld expect to have a negative (not detected) result in this assay.   Blood Culture (routine x 2)     Status: None (Preliminary result)   Collection Time: 04/24/19  9:39 PM    Specimen: BLOOD  Result Value Ref Range Status   Specimen Description BLOOD RIGHT ANTECUBITAL  Final   Special Requests   Final    BOTTLES DRAWN AEROBIC AND ANAEROBIC Blood Culture adequate volume   Culture   Final    NO GROWTH 2 DAYS Performed at [facility], [address], [place], [place] [address]    Report Status PENDING  Incomplete  Blood Culture (routine x 2)     Status: None (Preliminary result)   Collection Time: 04/24/19  9:48 PM   Specimen: BLOOD  Result Value Ref Range Status   Specimen Description BLOOD RIGHT ANTECUBITAL  Final   Special Requests   Final    BOTTLES DRAWN AEROBIC AND ANAEROBIC Blood Culture adequate volume   Culture   Final    NO GROWTH 2 DAYS Performed at [facility], [address], [place], [place] [address]    Report Status PENDING  Incomplete  Respiratory Panel by RT PCR (Flu A&B, Covid) - Nasopharyngeal Swab     Status: None   Collection Time: 04/25/19 12:01 AM   Specimen: Nasopharyngeal Swab  Result Value Ref Range Status   SARS Coronavirus 2 by RT PCR NEGATIVE NEGATIVE Final    Comment: (NOTE) SARS-CoV-2 target nucleic acids are NOT DETECTED. The SARS-CoV-2 RNA is generally detectable in upper respiratoy specimens during the acute phase of infection. The lowest concentration of SARS-CoV-2 viral copies this assay can detect is 131 copies/mL. A negative result does not preclude SARS-Cov-2 infection and should not be used as the sole basis for treatment or other patient management decisions. A negative result may occur with  improper specimen collection/handling, submission of specimen other than nasopharyngeal swab, presence of viral mutation(s) within the areas targeted by this assay, and inadequate number of viral copies (<131 copies/mL). A negative result must be combined with clinical observations, patient history, and epidemiological information. The expected result is Negative. Fact Sheet for Patients:   PinkCheek.be Fact Sheet for Healthcare Providers:  GravelBags.it This test is not yet ap proved or cleared by the [place] FDA and  has been authorized for detection and/or diagnosis of SARS-CoV-2 by FDA under an Emergency Use Authorization (EUA). This EUA will remain  in effect (meaning this test can be used) for the duration of the COVID-19 declaration under Section 564(b)(1) of the Act, 21 U.S.C. section 360bbb-3(b)(1), unless the authorization is terminated or revoked sooner.    Influenza A by PCR NEGATIVE NEGATIVE Final   Influenza B by PCR NEGATIVE NEGATIVE Final    Comment: (NOTE) The Xpert Xpress SARS-CoV-2/FLU/RSV assay is intended as an aid in  the diagnosis of influenza from Nasopharyngeal swab specimens and  should not be used as a sole basis for treatment. Nasal washings and  aspirates are unacceptable for Xpert Xpress SARS-CoV-2/FLU/RSV  testing. Fact Sheet for Patients: PinkCheek.be Fact Sheet for  Healthcare Providers: GravelBags.it This test is not yet approved or cleared by the [place] and  has been authorized for detection and/or diagnosis of SARS-CoV-2 by  FDA under an Emergency Use Authorization (EUA). This EUA will remain  in effect (meaning this test can be used) for the duration of the  Covid-19 declaration under Section 564(b)(1) of the Act, 21  U.S.C. section 360bbb-3(b)(1), unless the authorization is  terminated or revoked. Performed at [facility], [address] [place], [place] [address]      Scheduled Meds: . vitamin C  500 mg Oral Daily  . enoxaparin (LOVENOX) injection  70 mg Subcutaneous Q24H  . insulin aspart  0-15 Units Subcutaneous TID WC  . insulin aspart  0-5 Units Subcutaneous QHS  . insulin glargine  30 Units Subcutaneous BID  . levothyroxine  100 mcg Oral Q0600  . losartan  50 mg Oral Daily  . metoprolol  succinate  100 mg Oral Daily  . zinc sulfate  220 mg Oral Daily   Continuous Infusions: . ceFEPime (MAXIPIME) IV 2 g (04/26/19 1530)  . metronidazole 500 mg (04/26/19 0849)  . vancomycin 1,250 mg (04/26/19 1221)    Procedures/Studies: CT ANGIO CHEST PE W OR WO CONTRAST  Result Date: 04/25/2019 CLINICAL DATA:  Metastatic lung cancer, difficulty breathing/shortness of breath, nausea/vomiting x2 days, elevated D-dimer EXAM: CT ANGIOGRAPHY CHEST WITH CONTRAST TECHNIQUE: Multidetector CT imaging of the chest was performed using the standard protocol during bolus administration of intravenous contrast. Multiplanar CT image reconstructions and MIPs were obtained to evaluate the vascular anatomy. CONTRAST:  132mL OMNIPAQUE IOHEXOL 350 MG/ML SOLN COMPARISON:  CT abdomen/pelvis dated 04/24/2019. CT chest dated 06/18/2016. FINDINGS: Cardiovascular: Satisfactory opacification the bilateral pulmonary arteries to the lobar level. No evidence of pulmonary embolism. No evidence of thoracic aortic aneurysm or dissection. Mild atherosclerotic calcifications aortic root. The heart is normal in size. No pericardial effusion. Mitral valve annular calcifications. Coronary atherosclerosis the LAD and left circumflex. Mediastinum/Nodes: Small mediastinal lymph nodes, including a 9 mm short axis right paratracheal node and 8 mm short axis subcarinal node, within the upper limits of normal. Visualized left thyroid is unremarkable. Right thyroid is not discretely visualized and may be surgically absent. Lungs/Pleura: Radiation changes in the right upper lobe/suprahilar region. Radiation changes in the left lower lobe. Hyperdense nodular lesion at the left lung apex (series 5/image 34), new from the prior, possibly reflecting talc pleurodesis. However, there is nodular pleural thickening which is a new/progressive from the prior, for example, overlying the lateral left upper lobe/lingula, measuring approximately 7.9 x 2.3 cm  (series 4/image 61). Additional 15 mm nodule along the left hemidiaphragm (series 4/image 83) and 2.0 x 4.9 cm nodule along the left hemidiaphragm at the left lung base (series 4/image 89), new/progressive. Additional chest wall nodularity, some of which involve the ribs and are described below. However, there is also a 2.2 x 2.9 cm lesion at the left 9/10 rib interspace (series 4/image 98) and a 1.4 x 2.3 cm lesion at the left 11/12 rib interspace (series 4/image 102) in the posterior chest wall. Upper Abdomen: Visualized upper abdomen is notable for layering gallstones (series 5/image 308), a 1.8 cm medial right upper pole renal cyst, and a 2.0 cm left adrenal nodule which is chronic and favors a benign adrenal adenoma. Musculoskeletal: Old left anterior 5th rib fracture. Abnormal underlying pleural-based tumor in the left lateral chest wall (series 5/image 179). Additional old left rib fracture deformities. 3.6 x 8.3 cm left  chest wall mass with metastasis involving the left anterior 6th rib (series 5/image 225). 3.1 x 5.4 cm left lateral chest wall mass with metastasis involving the left lateral 7th rib (series 5/image 258). Degenerative changes of the thoracic spine. Review of the MIP images confirms the above findings. IMPRESSION: No evidence of pulmonary embolism. 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 Small mediastinal lymph nodes, within the upper limits of normal. Left adrenal nodule, chronic, favoring a benign adrenal adenoma. Consider PET-CT for further evaluation as clinically warranted. Aortic Atherosclerosis (ICD10-I70.0). Electronically Signed   By: Julian Hy M.D.   On: 04/25/2019 11:29   CT Abdomen Pelvis W Contrast  Result Date: 04/24/2019 CLINICAL DATA:  Nausea and vomiting x2 days. EXAM: CT ABDOMEN AND PELVIS WITH CONTRAST TECHNIQUE: Multidetector CT imaging of the abdomen and pelvis was performed using the standard protocol following  bolus administration of intravenous contrast. CONTRAST:  142mL OMNIPAQUE IOHEXOL 300 MG/ML  SOLN COMPARISON:  Chest CT, dated June 18, 2016, is available for comparison. FINDINGS: Lower chest: Mild atelectasis is seen within the left lung base. A stable 1.5 cm noncalcified lung nodule (approximately 3.04 Hounsfield units) is seen within the posteromedial aspect of the left lower lobe (axial CT image 20, CT series number 2). Hepatobiliary: No focal liver abnormality is seen. Subcentimeter gallstones are seen within the lumen of an otherwise normal-appearing gallbladder. Pancreas: Unremarkable. No pancreatic ductal dilatation or surrounding inflammatory changes. Spleen: Normal in size without focal abnormality. Adrenals/Urinary Tract: The right adrenal gland is normal in size and appearance. A stable 2.4 cm x 1.9 cm isodense left adrenal mass is seen. Kidneys are normal in size, without renal calculi or hydronephrosis. Several right renal cysts are seen. The largest measures approximately 3.2 cm x 2.3 cm and is seen along the lower pole of the right kidney. Bladder is unremarkable. Stomach/Bowel: There is a small hiatal hernia. Appendix appears normal. No evidence of bowel wall thickening, distention, or inflammatory changes. Vascular/Lymphatic: Mild aortic atherosclerosis. No enlarged abdominal or pelvic lymph nodes. Reproductive: The prostate gland is mildly enlarged. Other: No abdominal wall hernia or abnormality. No abdominopelvic ascites. Musculoskeletal: Chronic fifth, sixth and seventh left rib fractures are seen. Multilevel degenerative changes seen throughout the lumbar spine. IMPRESSION: 1. Cholelithiasis. 2. Stable left adrenal mass which may represent an adrenal adenoma. 3. Stable 1.5 cm noncalcified left lower lobe lung nodule. 4. Small hiatal hernia. Electronically Signed   By: Virgina Norfolk M.D.   On: 04/24/2019 20:08   DG CHEST PORT 1 VIEW  Result Date: 04/25/2019 CLINICAL DATA:  64 year old  male with history of acute respiratory failure. Cough. EXAM: PORTABLE CHEST 1 VIEW COMPARISON:  Chest x-ray 04/24/2019. FINDINGS: Partially loculated small to moderate left pleural effusion appears slightly increased compared to the prior study, with worsening left basilar opacity which may reflect increasing atelectasis and/or consolidation in the left lower lobe. Linear opacity in the right mid lung likely to represent some developing subsegmental atelectasis. No right pleural effusion. No evidence of pulmonary edema. Heart size is normal. Upper mediastinal contours are within normal limits. IMPRESSION: 1. Enlarging partially loculated small to moderate left pleural effusion with worsening atelectasis and/or consolidation in the left lower lobe. 2. Increasing subsegmental atelectasis in the right mid lung. Electronically Signed   By: Vinnie Langton M.D.   On: 04/25/2019 09:30   DG Chest Portable 1 View  Result Date: 04/24/2019 CLINICAL DATA:  Cough for 2 days. EXAM: PORTABLE CHEST 1 VIEW COMPARISON:  04/29/2017 and prior radiographs FINDINGS: The cardiomediastinal silhouette is unremarkable. LEFT basilar and RIGHT perihilar atelectasis/scarring again noted. There is no evidence of focal airspace disease, pulmonary edema, suspicious pulmonary nodule/mass, pleural effusion, or pneumothorax. No acute bony abnormalities are identified. IMPRESSION: No active disease. Electronically Signed   By: Margarette Canada M.D.   On: 04/24/2019 17:30    Orson Eva, DO  [facility] Pager [phone]  If 7PM-7AM, please contact night-coverage [email or web address] Password [facility] 04/26/2019, 3:39 PM   LOS: 1 day

## 2019-04-26 NOTE — Progress Notes (Signed)
Pt has ambulated in room to chair and to BR x 4 today. Pt took shower this am with assistance of wife. This afternoon, pt ambulated approx 529ft with only standby assistance. Tolerated well, no weakness or SOB. SaO2 remained 94-96% entire time. Pt has been up OOB most of day, noted some increased dependant edema in bilateral ankles.

## 2019-04-26 NOTE — Progress Notes (Signed)
Pt BP elevated via automatic reading. Manual reading of 162/90 by Bing Neighbors verified reading of 164/88. Pt alert, ambulated to chair, denies sob, dizziness and pain.

## 2019-04-26 NOTE — Progress Notes (Signed)
Pt up in chair at bedside. Pt stated his stomach feels uneasy and unsettled, denies pain or cramping. Pt stated he had miralax to aid in a bowel movement. Pt alert and oriented x4.

## 2019-04-26 NOTE — Progress Notes (Signed)
Inpatient Diabetes Program Recommendations  AACE/ADA: New Consensus Statement on Inpatient Glycemic Control (2015)  Target Ranges:  Prepandial:   less than 140 mg/dL      Peak postprandial:   less than 180 mg/dL (1-2 hours)      Critically ill patients:  140 - 180 mg/dL   Lab Results  Component Value Date   GLUCAP 314 (H) 04/26/2019   HGBA1C 7.9 (H) 12/27/2018    Review of Glycemic Control  Blood sugars above goal of 140-180 mg/dL.   Inpatient Diabetes Program Recommendations:    Add Novolog 4 units tidwc for meal coverage insulin. Increase Lantus to 40 units bid.  Will follow closely.  Thank you. Lorenda Peck, RD, LDN, CDE Inpatient Diabetes Coordinator [phone]

## 2019-04-26 NOTE — Progress Notes (Signed)
Notified midlevel of urine glucose >500, current CBG 249

## 2019-04-27 ENCOUNTER — Encounter (HOSPITAL_COMMUNITY): Payer: Self-pay | Admitting: Internal Medicine

## 2019-04-27 DIAGNOSIS — R112 Nausea with vomiting, unspecified: Secondary | ICD-10-CM

## 2019-04-27 LAB — CBC WITH DIFFERENTIAL/PLATELET
Abs Immature Granulocytes: 0.07 10*3/uL (ref 0.00–0.07)
Basophils Absolute: 0 10*3/uL (ref 0.0–0.1)
Basophils Relative: 0 %
Eosinophils Absolute: 0.2 10*3/uL (ref 0.0–0.5)
Eosinophils Relative: 4 %
HCT: 38.8 % — ABNORMAL LOW (ref 39.0–52.0)
Hemoglobin: 13.2 g/dL (ref 13.0–17.0)
Immature Granulocytes: 2 %
Lymphocytes Relative: 10 %
Lymphs Abs: 0.5 10*3/uL — ABNORMAL LOW (ref 0.7–4.0)
MCH: 29.7 pg (ref 26.0–34.0)
MCHC: 34 g/dL (ref 30.0–36.0)
MCV: 87.4 fL (ref 80.0–100.0)
Monocytes Absolute: 0.5 10*3/uL (ref 0.1–1.0)
Monocytes Relative: 12 %
Neutro Abs: 3.3 10*3/uL (ref 1.7–7.7)
Neutrophils Relative %: 72 %
Platelets: 134 10*3/uL — ABNORMAL LOW (ref 150–400)
RBC: 4.44 MIL/uL (ref 4.22–5.81)
RDW: 13.7 % (ref 11.5–15.5)
WBC: 4.5 10*3/uL (ref 4.0–10.5)
nRBC: 0 % (ref 0.0–0.2)

## 2019-04-27 LAB — COMPREHENSIVE METABOLIC PANEL
ALT: 55 U/L — ABNORMAL HIGH (ref 0–44)
AST: 23 U/L (ref 15–41)
Albumin: 2.8 g/dL — ABNORMAL LOW (ref 3.5–5.0)
Alkaline Phosphatase: 85 U/L (ref 38–126)
Anion gap: 8 (ref 5–15)
BUN: 27 mg/dL — ABNORMAL HIGH (ref 8–23)
CO2: 26 mmol/L (ref 22–32)
Calcium: 8.1 mg/dL — ABNORMAL LOW (ref 8.9–10.3)
Chloride: 101 mmol/L (ref 98–111)
Creatinine, Ser: 0.92 mg/dL (ref 0.61–1.24)
GFR calc Af Amer: >60 mL/min (ref >60)
GFR calc non Af Amer: >60 mL/min (ref >60)
Glucose, Bld: 178 mg/dL — ABNORMAL HIGH (ref 70–99)
Potassium: 3.4 mmol/L — ABNORMAL LOW (ref 3.5–5.1)
Sodium: 135 mmol/L (ref 135–145)
Total Bilirubin: 1 mg/dL (ref 0.3–1.2)
Total Protein: 5.7 g/dL — ABNORMAL LOW (ref 6.5–8.1)

## 2019-04-27 LAB — GLUCOSE, CAPILLARY
Glucose-Capillary: 156 mg/dL — ABNORMAL HIGH (ref 70–99)
Glucose-Capillary: 215 mg/dL — ABNORMAL HIGH (ref 70–99)
Glucose-Capillary: 222 mg/dL — ABNORMAL HIGH (ref 70–99)
Glucose-Capillary: 218 mg/dL — ABNORMAL HIGH (ref 70–99)

## 2019-04-27 LAB — PROCALCITONIN: Procalcitonin: 5.01 ng/mL

## 2019-04-27 LAB — MRSA PCR SCREENING: MRSA by PCR: NEGATIVE

## 2019-04-27 MED ORDER — SIMETHICONE 80 MG PO CHEW
80.0000 mg | CHEWABLE_TABLET | Freq: Once | ORAL | Status: AC
  Administered 2019-04-27: 80 mg via ORAL
  Filled 2019-04-27: qty 1

## 2019-04-27 MED ORDER — POLYETHYLENE GLYCOL 3350 17 G PO PACK
17.0000 g | PACK | Freq: Every day | ORAL | Status: DC
  Administered 2019-04-27: 17 g via ORAL
  Filled 2019-04-27: qty 1

## 2019-04-27 MED ORDER — ROSUVASTATIN CALCIUM 20 MG PO TABS
40.0000 mg | ORAL_TABLET | Freq: Every day | ORAL | Status: DC
  Administered 2019-04-27: 40 mg via ORAL
  Filled 2019-04-27: qty 2

## 2019-04-27 MED ORDER — BISACODYL 10 MG RE SUPP
10.0000 mg | Freq: Once | RECTAL | Status: AC
  Administered 2019-04-27: 10 mg via RECTAL
  Filled 2019-04-27: qty 1

## 2019-04-27 MED ORDER — POTASSIUM CHLORIDE CRYS ER 20 MEQ PO TBCR
20.0000 meq | EXTENDED_RELEASE_TABLET | Freq: Once | ORAL | Status: AC
  Administered 2019-04-27: 20 meq via ORAL
  Filled 2019-04-27: qty 1

## 2019-04-27 NOTE — Progress Notes (Signed)
PROGRESS NOTE  Steven Ibarra OVF:[id number] DOB: 1955-12-12 DOA: 04/24/2019 PCP: Mosie Lukes, MD   Brief History:  64 year old male with a history of metastatic carcinoma status post VATS 09/09/2018 with parietal pleural biopsy presenting with poor oral intake, generalized weakness, nausea and vomiting x2 days.  The patient was noted to be febrile up to 103.4 F and tachycardic.  PCT was 16.77.  Lactic acid peaked at 2.6.  The patient himself has chronic shortness of breath.  He states that this was unchanged.  He denies any chest pain, coughing, hemoptysis, diarrhea, abdominal pain, dysuria.  CTA chest was negative for PE but shows widespread pleural-based and chest wall tumor in the left hemithorax.  There is upper limit of normal mediastinal lymph nodes.  There is a chronic left adrenal nodule.  CT of the abdomen and pelvis showed cholelithiasis with stable left adrenal mass and left lower lobe lung nodule.  The patient was started on vancomycin, cefepime, and metronidazole. nodular pleural thickening which is a new/progressive from the prior, for example, overlying the lateral left upper lobe/lingula, measuring approximately 7.9 x 2.3 cm  Assessment/Plan: Sepsis -Present on admission -secondary to pneumonia (HCAP) -Lactic acid 2.6 -Continue IV cefepime -d/c vanco -Follow blood cultures--neg to date -UA and urine culture were not obtained -repeat PCT16.77>>>5.01 -MRSA PCR--neg  Acute respiratory failure with hypoxia -Initially stable on 2 L nasal cannula>>>weaned to RA -04/25/2019 CTA chest--negative PE--widespread pleural-based and chest wall tumor in the left hemithorax -Certainly, findings on CT chest may represent postradiation changes versus metastatic cancer versus infectious process -In the setting of fever and tachycardia, plan to treat as pneumonia -SARS-CoV2 RT-PCR--neg  Metastatic adenocarcinoma -Mets to lymph nodes, pleura, lungs with effusions -Not  progressing to pleural as well as bones -s/p VATS chemical pleurodesis and nivolumab X5 -follows med onc at [facility] -Patient 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) -Plan was to start the patient on vismodegib   Diabetes mellitus type 2, uncontrolled with hyperglycemia -NovoLog sliding scale -12/27/2018 hemoglobin A1c 7.9 -holding metformin  Essential hypertension -Holding chlorthalidone -Continue losartan and metoprolol  Hypokalemia -Repleted  Hypothyroidism -Continue Synthroid  Hyperlipidemia -continue statin when able to tolerate po      Disposition Plan:   Home 1/29 if stable Family Communication:   Spouse updated at bedside 1/28  Consultants:  none  Code Status:  FULL   DVT Prophylaxis:  [place] Lovenox   Procedures: As Listed in Progress Note Above  Antibiotics: vanco 1/25>>>1/29 Cefepime 1/25>>> Metronidazole 1/25>>>1/29     Subjective: Patient denies fevers, chills, headache, chest pain, dyspnea, nausea, vomiting, diarrhea, abdominal pain, dysuria, hematuria, hematochezia, and melena.   Objective: Vitals:   04/26/19 2124 04/26/19 2134 04/27/19 0609 04/27/19 1104  BP: (!) 190/112 (!) 162/90 130/82 120/61  Pulse: 72  72 69  Resp: 18  16   Temp: 98 F (36.7 C)  98.6 F (37 C)   TempSrc: Oral  Oral   SpO2: 97%  96%   Weight:      Height:        Intake/Output Summary (Last 24 hours) at 04/27/2019 1414 Last data filed at 04/27/2019 8416 Gross  per 24 hour  Intake 2369.32 ml  Output 450 ml  Net 1919.32 ml   Weight change:  Exam:   General:  Pt is alert, follows commands  appropriately, not in acute distress  HEENT: No icterus, No thrush, No neck mass, /AT  Cardiovascular: RRR, S1/S2, no rubs, no gallops  Respiratory: bibasilar rales. No wheeze  Abdomen: Soft/+BS, non tender, non distended, no guarding  Extremities: 1+ LE edema, No lymphangitis, No petechiae, No rashes, no synovitis   Data Reviewed: I have personally reviewed following labs and imaging studies Basic Metabolic Panel: Recent Labs  Lab 04/24/19 1710 04/25/19 0252 04/26/19 0550 04/27/19 0556  NA 129* 135 134* 135  K 3.4* 4.2 3.6 3.4*  CL 95* 102 100 101  CO2 25 25 25 26   GLUCOSE 310* 274* 236* 178*  BUN 23 22 27* 27*  CREATININE 1.15 1.06 0.98 0.92  CALCIUM 9.2 8.5* 8.4* 8.1*   Liver Function Tests: Recent Labs  Lab 04/24/19 1710 04/25/19 0252 04/26/19 0550 04/27/19 0556  AST 55* 43* 31 23  ALT 108* 92* 72* 55*  ALKPHOS 58 58 68 85  BILITOT 2.0* 2.8* 1.2 1.0  PROT 6.4* 6.1* 5.9* 5.7*  ALBUMIN 3.3* 3.2* 2.9* 2.8*   Recent Labs  Lab 04/24/19 1710  LIPASE 16   No results for input(s): AMMONIA in the last 168 hours. Coagulation Profile: No results for input(s): INR, PROTIME in the last 168 hours. CBC: Recent Labs  Lab 04/24/19 1710 04/25/19 0252 04/26/19 0550 04/27/19 0556  WBC 6.0 5.8 5.9 4.5  NEUTROABS 5.6 5.2 4.9 3.3  HGB 13.6 13.2 13.3 13.2  HCT 39.4 39.2 39.4 38.8*  MCV 87.8 88.5 87.2 87.4  PLT 117* 112* 134* 134*   Cardiac Enzymes: No results for input(s): CKTOTAL, CKMB, CKMBINDEX, TROPONINI in the last 168 hours. BNP: Invalid input(s): POCBNP CBG: Recent Labs  Lab 04/26/19 1128 04/26/19 1624 04/26/19 2112 04/27/19 0744 04/27/19 1129  GLUCAP 314* 269* 249* 156* 215*   HbA1C: Recent Labs    04/26/19 0550  HGBA1C 8.1*   Urine analysis:    Component Value Date/Time   COLORURINE YELLOW 04/26/2019 2037   APPEARANCEUR CLEAR 04/26/2019 2037   LABSPEC 1.025 04/26/2019 2037   PHURINE 5.0 04/26/2019 2037   GLUCOSEU >=500 (A)  04/26/2019 2037   HGBUR NEGATIVE 04/26/2019 2037   BILIRUBINUR NEGATIVE 04/26/2019 2037   BILIRUBINUR neg 09/25/2011 [place] 04/26/2019 2037   PROTEINUR NEGATIVE 04/26/2019 2037   UROBILINOGEN 0.2 09/25/2011 0844   UROBILINOGEN 0.2 03/25/2010 2030   NITRITE NEGATIVE 04/26/2019 2037   LEUKOCYTESUR NEGATIVE 04/26/2019 2037   Sepsis Labs: @LABRCNTIP (procalcitonin:4,lacticidven:4) ) Recent Results (from the past 240 hour(s))  Novel Coronavirus, NAA ([facility])     Status: None   Collection Time: 04/24/19  9:49 AM   Specimen: Nasopharyngeal(NP) swabs in vial transport medium   NASOPHARYNGE  TESTING  Result Value Ref Range Status   SARS-CoV-2, NAA Not Detected Not Detected Final    Comment: This nucleic acid amplification test was developed and its performance characteristics determined by [employer]. Nucleic acid amplification tests include RT-PCR and TMA. This test has not been FDA cleared or approved. This test has been authorized by FDA under an Emergency Use Authorization (EUA). This test is only authorized for the duration of time the declaration that circumstances exist justifying the authorization of the emergency use of in vitro diagnostic tests for detection of SARS-CoV-2 virus and/or diagnosis of COVID-19 infection under section 564(b)(1) of the  Act, 21 U.S.C. 469GEX-5(M) (1), unless the authorization is terminated or revoked sooner. When diagnostic testing is negative, the possibility of a false negative result should be considered in the context of a patient's recent exposures and the presence of clinical signs and symptoms consistent with COVID-19. An individual without symptoms of COVID-19 and who is not shedding SARS-CoV-2 virus wo uld expect to have a negative (not detected) result in this assay.   Blood Culture (routine x 2)     Status: None (Preliminary result)   Collection Time: 04/24/19  9:39 PM   Specimen: BLOOD  Result Value Ref  Range Status   Specimen Description BLOOD RIGHT ANTECUBITAL  Final   Special Requests   Final    BOTTLES DRAWN AEROBIC AND ANAEROBIC Blood Culture adequate volume   Culture   Final    NO GROWTH 3 DAYS Performed at [facility], [address] [place], [place] [address]    Report Status PENDING  Incomplete  Blood Culture (routine x 2)     Status: None (Preliminary result)   Collection Time: 04/24/19  9:48 PM   Specimen: BLOOD  Result Value Ref Range Status   Specimen Description BLOOD RIGHT ANTECUBITAL  Final   Special Requests   Final    BOTTLES DRAWN AEROBIC AND ANAEROBIC Blood Culture adequate volume   Culture   Final    NO GROWTH 3 DAYS Performed at [facility], [address], [place], [place] [address]    Report Status PENDING  Incomplete  Respiratory Panel by RT PCR (Flu A&B, Covid) - Nasopharyngeal Swab     Status: None   Collection Time: 04/25/19 12:01 AM   Specimen: Nasopharyngeal Swab  Result Value Ref Range Status   SARS Coronavirus 2 by RT PCR NEGATIVE NEGATIVE Final    Comment: (NOTE) SARS-CoV-2 target nucleic acids are NOT DETECTED. The SARS-CoV-2 RNA is generally detectable in upper respiratoy specimens during the acute phase of infection. The lowest concentration of SARS-CoV-2 viral copies this assay can detect is 131 copies/mL. A negative result does not preclude SARS-Cov-2 infection and should not be used as the sole basis for treatment or other patient management decisions. A negative result may occur with  improper specimen collection/handling, submission of specimen other than nasopharyngeal swab, presence of viral mutation(s) within the areas targeted by this assay, and inadequate number of viral copies (<131 copies/mL). A negative result must be combined with clinical observations, patient history, and epidemiological information. The expected result is Negative. Fact Sheet for Patients:  PinkCheek.be Fact Sheet for  Healthcare Providers:  GravelBags.it This test is not yet ap proved or cleared by the [place] FDA and  has been authorized for detection and/or diagnosis of SARS-CoV-2 by FDA under an Emergency Use Authorization (EUA). This EUA will remain  in effect (meaning this test can be used) for the duration of the COVID-19 declaration under Section 564(b)(1) of the Act, 21 U.S.C. section 360bbb-3(b)(1), unless the authorization is terminated or revoked sooner.    Influenza A by PCR NEGATIVE NEGATIVE Final   Influenza B by PCR NEGATIVE NEGATIVE Final    Comment: (NOTE) The Xpert Xpress SARS-CoV-2/FLU/RSV assay is intended as an aid in  the diagnosis of influenza from Nasopharyngeal swab specimens and  should not be used as a sole basis for treatment. Nasal washings and  aspirates are unacceptable for Xpert Xpress SARS-CoV-2/FLU/RSV  testing. Fact Sheet for Patients: PinkCheek.be Fact Sheet for Healthcare Providers: GravelBags.it This test is not yet approved or cleared  by the [place] and  has been authorized for detection and/or diagnosis of SARS-CoV-2 by  FDA under an Emergency Use Authorization (EUA). This EUA will remain  in effect (meaning this test can be used) for the duration of the  Covid-19 declaration under Section 564(b)(1) of the Act, 21  U.S.C. section 360bbb-3(b)(1), unless the authorization is  terminated or revoked. Performed at [facility], [address], [place], [place] [address]   MRSA PCR Screening     Status: None   Collection Time: 04/26/19  8:34 AM   Specimen: Nasal Mucosa; Nasopharyngeal  Result Value Ref Range Status   MRSA by PCR NEGATIVE NEGATIVE Final    Comment:        The GeneXpert MRSA Assay (FDA approved for NASAL specimens only), is one component of a comprehensive MRSA colonization surveillance program. It is not intended to diagnose MRSA infection  nor to guide or monitor treatment for MRSA infections. Performed at [facility], [address], [place], [place] [address]      Scheduled Meds: . vitamin C  500 mg Oral Daily  . enoxaparin (LOVENOX) injection  70 mg Subcutaneous Q24H  . insulin aspart  0-20 Units Subcutaneous TID WC  . insulin aspart  0-5 Units Subcutaneous QHS  . insulin glargine  40 Units Subcutaneous BID  . levothyroxine  100 mcg Oral Q0600  . losartan  50 mg Oral Daily  . metoprolol succinate  100 mg Oral Daily  . polyethylene glycol  17 g Oral Daily  . potassium chloride  20 mEq Oral Once  . zinc sulfate  220 mg Oral Daily   Continuous Infusions: . ceFEPime (MAXIPIME) IV 2 g (04/27/19 0519)    Procedures/Studies: CT ANGIO CHEST PE W OR WO CONTRAST  Result Date: 04/25/2019 CLINICAL DATA:  Metastatic lung cancer, difficulty breathing/shortness of breath, nausea/vomiting x2 days, elevated D-dimer EXAM: CT ANGIOGRAPHY CHEST WITH CONTRAST TECHNIQUE: Multidetector CT imaging of the chest was performed using the standard protocol during bolus administration of intravenous contrast. Multiplanar CT image reconstructions and MIPs were obtained to evaluate the vascular anatomy. CONTRAST:  144mL OMNIPAQUE IOHEXOL 350 MG/ML SOLN COMPARISON:  CT abdomen/pelvis dated 04/24/2019. CT chest dated 06/18/2016. FINDINGS: Cardiovascular: Satisfactory opacification the bilateral pulmonary arteries to the lobar level. No evidence of pulmonary embolism. No evidence of thoracic aortic aneurysm or dissection. Mild atherosclerotic calcifications aortic root. The heart is normal in size. No pericardial effusion. Mitral valve annular calcifications. Coronary atherosclerosis the LAD and left circumflex. Mediastinum/Nodes: Small mediastinal lymph nodes, including a 9 mm short axis right paratracheal node and 8 mm short axis subcarinal node, within the upper limits of normal. Visualized left thyroid is unremarkable. Right thyroid is not discretely  visualized and may be surgically absent. Lungs/Pleura: Radiation changes in the right upper lobe/suprahilar region. Radiation changes in the left lower lobe. Hyperdense nodular lesion at the left lung apex (series 5/image 34), new from the prior, possibly reflecting talc pleurodesis. However, there is nodular pleural thickening which is a new/progressive from the prior, for example, overlying the lateral left upper lobe/lingula, measuring approximately 7.9 x 2.3 cm (series 4/image 61). Additional 15 mm nodule along the left hemidiaphragm (series 4/image 83) and 2.0 x 4.9 cm nodule along the left hemidiaphragm at the left lung base (series 4/image 89), new/progressive. Additional chest wall nodularity, some of which involve the ribs and are described below. However, there is also a 2.2 x 2.9 cm lesion at the left 9/10 rib interspace (series 4/image  98) and a 1.4 x 2.3 cm lesion at the left 11/12 rib interspace (series 4/image 102) in the posterior chest wall. Upper Abdomen: Visualized upper abdomen is notable for layering gallstones (series 5/image 308), a 1.8 cm medial right upper pole renal cyst, and a 2.0 cm left adrenal nodule which is chronic and favors a benign adrenal adenoma. Musculoskeletal: Old left anterior 5th rib fracture. Abnormal underlying pleural-based tumor in the left lateral chest wall (series 5/image 179). Additional old left rib fracture deformities. 3.6 x 8.3 cm left chest wall mass with metastasis involving the left anterior 6th rib (series 5/image 225). 3.1 x 5.4 cm left lateral chest wall mass with metastasis involving the left lateral 7th rib (series 5/image 258). Degenerative changes of the thoracic spine. Review of the MIP images confirms the above findings. IMPRESSION: No evidence of pulmonary embolism. 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 Small mediastinal lymph nodes, within the upper limits of normal. Left adrenal nodule, chronic,  favoring a benign adrenal adenoma. Consider PET-CT for further evaluation as clinically warranted. Aortic Atherosclerosis (ICD10-I70.0). Electronically Signed   By: Julian Hy M.D.   On: 04/25/2019 11:29   CT Abdomen Pelvis W Contrast  Result Date: 04/24/2019 CLINICAL DATA:  Nausea and vomiting x2 days. EXAM: CT ABDOMEN AND PELVIS WITH CONTRAST TECHNIQUE: Multidetector CT imaging of the abdomen and pelvis was performed using the standard protocol following bolus administration of intravenous contrast. CONTRAST:  160mL OMNIPAQUE IOHEXOL 300 MG/ML  SOLN COMPARISON:  Chest CT, dated June 18, 2016, is available for comparison. FINDINGS: Lower chest: Mild atelectasis is seen within the left lung base. A stable 1.5 cm noncalcified lung nodule (approximately 3.04 Hounsfield units) is seen within the posteromedial aspect of the left lower lobe (axial CT image 20, CT series number 2). Hepatobiliary: No focal liver abnormality is seen. Subcentimeter gallstones are seen within the lumen of an otherwise normal-appearing gallbladder. Pancreas: Unremarkable. No pancreatic ductal dilatation or surrounding inflammatory changes. Spleen: Normal in size without focal abnormality. Adrenals/Urinary Tract: The right adrenal gland is normal in size and appearance. A stable 2.4 cm x 1.9 cm isodense left adrenal mass is seen. Kidneys are normal in size, without renal calculi or hydronephrosis. Several right renal cysts are seen. The largest measures approximately 3.2 cm x 2.3 cm and is seen along the lower pole of the right kidney. Bladder is unremarkable. Stomach/Bowel: There is a small hiatal hernia. Appendix appears normal. No evidence of bowel wall thickening, distention, or inflammatory changes. Vascular/Lymphatic: Mild aortic atherosclerosis. No enlarged abdominal or pelvic lymph nodes. Reproductive: The prostate gland is mildly enlarged. Other: No abdominal wall hernia or abnormality. No abdominopelvic ascites.  Musculoskeletal: Chronic fifth, sixth and seventh left rib fractures are seen. Multilevel degenerative changes seen throughout the lumbar spine. IMPRESSION: 1. Cholelithiasis. 2. Stable left adrenal mass which may represent an adrenal adenoma. 3. Stable 1.5 cm noncalcified left lower lobe lung nodule. 4. Small hiatal hernia. Electronically Signed   By: Virgina Norfolk M.D.   On: 04/24/2019 20:08   DG CHEST PORT 1 VIEW  Result Date: 04/25/2019 CLINICAL DATA:  64 year old male with history of acute respiratory failure. Cough. EXAM: PORTABLE CHEST 1 VIEW COMPARISON:  Chest x-ray 04/24/2019. FINDINGS: Partially loculated small to moderate left pleural effusion appears slightly increased compared to the prior study, with worsening left basilar opacity which may reflect increasing atelectasis and/or consolidation in the left lower lobe. Linear opacity in the right mid lung likely to represent some  developing subsegmental atelectasis. No right pleural effusion. No evidence of pulmonary edema. Heart size is normal. Upper mediastinal contours are within normal limits. IMPRESSION: 1. Enlarging partially loculated small to moderate left pleural effusion with worsening atelectasis and/or consolidation in the left lower lobe. 2. Increasing subsegmental atelectasis in the right mid lung. Electronically Signed   By: Vinnie Langton M.D.   On: 04/25/2019 09:30   DG Chest Portable 1 View  Result Date: 04/24/2019 CLINICAL DATA:  Cough for 2 days. EXAM: PORTABLE CHEST 1 VIEW COMPARISON:  04/29/2017 and prior radiographs FINDINGS: The cardiomediastinal silhouette is unremarkable. LEFT basilar and RIGHT perihilar atelectasis/scarring again noted. There is no evidence of focal airspace disease, pulmonary edema, suspicious pulmonary nodule/mass, pleural effusion, or pneumothorax. No acute bony abnormalities are identified. IMPRESSION: No active disease. Electronically Signed   By: Margarette Canada M.D.   On: 04/24/2019 17:30     Orson Eva, DO  [facility] Pager [phone]  If 7PM-7AM, please contact night-coverage [email or web address] Password [email or web address] 04/27/2019, 2:14 PM   LOS: 2 days

## 2019-04-28 ENCOUNTER — Encounter (HOSPITAL_COMMUNITY): Payer: Self-pay | Admitting: Internal Medicine

## 2019-04-28 DIAGNOSIS — C7800 Secondary malignant neoplasm of unspecified lung: Secondary | ICD-10-CM | POA: Diagnosis not present

## 2019-04-28 DIAGNOSIS — C4441 Basal cell carcinoma of skin of scalp and neck: Secondary | ICD-10-CM | POA: Diagnosis not present

## 2019-04-28 DIAGNOSIS — A419 Sepsis, unspecified organism: Secondary | ICD-10-CM

## 2019-04-28 DIAGNOSIS — C779 Secondary and unspecified malignant neoplasm of lymph node, unspecified: Secondary | ICD-10-CM | POA: Diagnosis not present

## 2019-04-28 DIAGNOSIS — C782 Secondary malignant neoplasm of pleura: Secondary | ICD-10-CM | POA: Diagnosis not present

## 2019-04-28 HISTORY — DX: Sepsis, unspecified organism: A41.9

## 2019-04-28 LAB — COMPREHENSIVE METABOLIC PANEL
ALT: 46 U/L — ABNORMAL HIGH (ref 0–44)
AST: 23 U/L (ref 15–41)
Albumin: 2.8 g/dL — ABNORMAL LOW (ref 3.5–5.0)
Alkaline Phosphatase: 99 U/L (ref 38–126)
Anion gap: 9 (ref 5–15)
BUN: 20 mg/dL (ref 8–23)
CO2: 27 mmol/L (ref 22–32)
Calcium: 8.3 mg/dL — ABNORMAL LOW (ref 8.9–10.3)
Chloride: 101 mmol/L (ref 98–111)
Creatinine, Ser: 0.85 mg/dL (ref 0.61–1.24)
GFR calc Af Amer: >60 mL/min (ref >60)
GFR calc non Af Amer: >60 mL/min (ref >60)
Glucose, Bld: 173 mg/dL — ABNORMAL HIGH (ref 70–99)
Potassium: 3.5 mmol/L (ref 3.5–5.1)
Sodium: 137 mmol/L (ref 135–145)
Total Bilirubin: 0.8 mg/dL (ref 0.3–1.2)
Total Protein: 5.5 g/dL — ABNORMAL LOW (ref 6.5–8.1)

## 2019-04-28 LAB — CBC WITH DIFFERENTIAL/PLATELET
Abs Immature Granulocytes: 0.19 10*3/uL — ABNORMAL HIGH (ref 0.00–0.07)
Basophils Absolute: 0.1 10*3/uL (ref 0.0–0.1)
Basophils Relative: 1 %
Eosinophils Absolute: 0.1 10*3/uL (ref 0.0–0.5)
Eosinophils Relative: 2 %
HCT: 39.4 % (ref 39.0–52.0)
Hemoglobin: 13.3 g/dL (ref 13.0–17.0)
Immature Granulocytes: 3 %
Lymphocytes Relative: 12 %
Lymphs Abs: 0.7 10*3/uL (ref 0.7–4.0)
MCH: 29.6 pg (ref 26.0–34.0)
MCHC: 33.8 g/dL (ref 30.0–36.0)
MCV: 87.8 fL (ref 80.0–100.0)
Monocytes Absolute: 0.6 10*3/uL (ref 0.1–1.0)
Monocytes Relative: 11 %
Neutro Abs: 3.9 10*3/uL (ref 1.7–7.7)
Neutrophils Relative %: 71 %
Platelets: 149 10*3/uL — ABNORMAL LOW (ref 150–400)
RBC: 4.49 MIL/uL (ref 4.22–5.81)
RDW: 13.7 % (ref 11.5–15.5)
WBC: 5.6 10*3/uL (ref 4.0–10.5)
nRBC: 0 % (ref 0.0–0.2)

## 2019-04-28 LAB — URINE CULTURE: Culture: NO GROWTH

## 2019-04-28 LAB — GLUCOSE, CAPILLARY: Glucose-Capillary: 157 mg/dL — ABNORMAL HIGH (ref 70–99)

## 2019-04-28 LAB — PROCALCITONIN: Procalcitonin: 2.44 ng/mL

## 2019-04-28 LAB — MAGNESIUM: Magnesium: 2.1 mg/dL (ref 1.7–2.4)

## 2019-04-28 MED ORDER — LEVOFLOXACIN 500 MG PO TABS
500.0000 mg | ORAL_TABLET | Freq: Every day | ORAL | Status: DC
  Administered 2019-04-28: 09:00:00 500 mg via ORAL
  Filled 2019-04-28: qty 1

## 2019-04-28 MED ORDER — LEVOFLOXACIN 500 MG PO TABS: 500.0000 mg | ORAL_TABLET | Freq: Every day | ORAL | 0 refills | Status: DC

## 2019-04-28 NOTE — Discharge Summary (Signed)
Physician Discharge Summary  Steven Ibarra JJK:[id number] DOB: September 21, 1955 DOA: 04/24/2019  PCP: Mosie Lukes, MD  Admit date: 04/24/2019 Discharge date: 04/28/2019  Admitted From: Home Disposition:  Home  Recommendations for Outpatient Follow-up:  1. Follow up with PCP in 1-2 weeks 2. Please obtain BMP/CBC in one week     Discharge Condition: Stable CODE STATUS: FULL Diet recommendation: Heart Healthy / Carb Modified   Brief/Interim Summary: 64 year old male with a history of metastatic carcinoma status post VATS 09/09/2018 with parietal pleural biopsy presenting with poor oral intake, generalized weakness, nausea and vomiting x2 days.  The patient was noted to be febrile up to 103.4 F and tachycardic.  PCT was 16.77.  Lactic acid peaked at 2.6.  The patient himself has chronic shortness of breath.  He states that this was unchanged.  He denies any chest pain, coughing, hemoptysis, diarrhea, abdominal pain, dysuria.  CTA chest was negative for PE but shows widespread pleural-based and chest wall tumors in the left hemithorax new/progressive from the prior, for example, overlying the lateral left upper lobe/lingula, measuring approximately 7.9 x 2.3 cm.  There is upper limit of normal mediastinal lymph nodes.  There is a chronic left adrenal nodule.  CT of the abdomen and pelvis showed cholelithiasis with stable left adrenal mass and left lower lobe lung nodule.  The patient was started on vancomycin, cefepime, and metronidazole.  Lactic acid peaked at 2.6.  Procalcitonin was initially 16.77.  Blood cultures and urine cultures were negative.  The patient was treated for presumptive pneumonia given his CT chest findings.  He was treated empirically for pneumonia assuming that some of his nodular opacities may represent an infectious process.  The patient defervesced and his sepsis physiology resolved.  He remained afebrile and hemodynamically stable throughout the rest of the  hospitalization.  He was transitioned to oral levofloxacin for 3 additional days to finish 1 week of therapy.  SARS-CoV2 RT-PCR was negative.    Discharge Diagnoses:  Sepsis -Present on admission -secondary to pneumonia (HCAP) -Lactic acid 2.6 on presentation -Continue IV cefepime -d/c vanco -Follow blood cultures--neg to date -UA and urine culture were not obtained -repeat PCT16.77>>>5.01>>2.44 -MRSA PCR--neg  Acute respiratory failure with hypoxia -Initially stable on 2 L nasal cannula>>>weaned to RA -04/25/2019 CTA chest--negative PE--widespread pleural-based and chest wall tumor in the left hemithorax -Certainly, findings on CT chest may represent postradiation changes versus metastatic cancer versus infectious process -In the setting of fever and tachycardia, plan to treat as pneumonia -SARS-CoV2 RT-PCR--neg  -d/c home with levofloxacin x 3 more days to finish 1 week of abx  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) -Plan was to start the patient on vismodegib   Diabetes mellitus type 2, uncontrolled with hyperglycemia -NovoLog sliding scale -12/27/2018 hemoglobin A1c 7.9 -holding metformin  Essential hypertension -Holding chlorthalidone--restart after d/c -Continue losartan and metoprolol  Hypokalemia -Repleted  Hypothyroidism -Continue Synthroid  Hyperlipidemia -continue statin    Discharge  Instructions   Allergies as of 04/28/2019   No Known Allergies  Medication List    STOP taking these medications   B-D ULTRAFINE III SHORT PEN 31G X 8 MM Misc Generic drug: Insulin Pen Needle   BD Insulin Syringe U/F 31G X 5/16" 1 ML Misc Generic drug: Insulin Syringe-Needle U-100   benzonatate 100 MG capsule Commonly known as: TESSALON   CONTOUR BLOOD GLUCOSE SYSTEM Devi   glucose blood test strip Commonly known as: Contour Next Test   HYDROcodone-homatropine 5-1.5 MG/5ML syrup Commonly known as: HYCODAN   ipratropium 17 MCG/ACT inhaler Commonly known as: ATROVENT HFA   Microlet Lancets Misc   promethazine 12.5 MG tablet Commonly known as: PHENERGAN   Syringe (Disposable) 3 ML Misc     TAKE these medications   albuterol 108 (90 Base) MCG/ACT inhaler Commonly known as: VENTOLIN HFA Inhale 1-2 puffs into the lungs every 4 (four) hours as needed for wheezing or shortness of breath.   ALPRAZolam 0.25 MG tablet Commonly known as: XANAX TAKE 1 TABLET(0.25 MG) BY MOUTH TWICE DAILY AS NEEDED FOR ANXIETY What changed: See the new instructions.   cetirizine 10 MG tablet Commonly known as: ZYRTEC Take 10 mg by mouth daily as needed for allergies.   chlorthalidone 25 MG tablet Commonly known as: HYGROTON Take 1 tablet (25 mg total) by mouth daily as needed.   diclofenac Sodium 1 % Gel Commonly known as: VOLTAREN Apply 2 g topically 2 (two) times daily.   docusate sodium 100 MG capsule Commonly known as: COLACE Take 100 mg by mouth daily.   famotidine 40 MG tablet Commonly known as: PEPCID TAKE 1 TABLET(40 MG) BY MOUTH DAILY   fenofibrate 160 MG tablet TAKE 1 TABLET(160 MG) BY MOUTH DAILY   fluticasone 50 MCG/ACT nasal spray Commonly known as: FLONASE Place 2 sprays into both nostrils daily.   ibuprofen 200 MG tablet Commonly known as: ADVIL Take 600 mg by mouth every 6 (six) hours as needed for pain.   insulin lispro 100 UNIT/ML  injection Commonly known as: HUMALOG INJECT 20 TO 24 UNITS INTO SKIN THREE TIMES DAILY WITH MEALS AS DIRECTED AS NEEDED What changed: See the new instructions.   Krill Oil 300 MG Caps Take 1 capsule by mouth daily.   Lantus SoloStar 100 UNIT/ML Solostar Pen Generic drug: Insulin Glargine INJECT 100 UNITS INTO THE SKIN IN MORNING AND INJECT 60 UNITS INTO THE SKIN EVERY EVENING. What changed: See the new instructions.   levofloxacin 500 MG tablet Commonly known as: LEVAQUIN Take 1 tablet (500 mg total) by mouth daily. Start taking on: April 29, 2019   levothyroxine 100 MCG tablet Commonly known as: SYNTHROID TAKE 1 TABLET(100 MCG) BY MOUTH DAILY BEFORE BREAKFAST   losartan 50 MG tablet Commonly known as: COZAAR TAKE 1 TABLET(50 MG) BY MOUTH TWICE DAILY What changed: See the new instructions.   metFORMIN 500 MG tablet Commonly known as: GLUCOPHAGE TAKE 2 TABLETS(1000 MG) BY MOUTH TWICE DAILY What changed: See the new instructions.   metoprolol succinate 100 MG 24 hr tablet Commonly known as: TOPROL-XL TAKE 1 TABLET BY MOUTH EVERY DAY WITH OR IMMEDIATELY FOLLOWING A MEAL What changed: See the new instructions.   mometasone 220 MCG/INH inhaler Commonly known as: Asmanex (60 Metered Doses) Inhale 1 puff into the lungs 2 (two) times daily. What changed:   when to take this  reasons to take this   NON FORMULARY 1 each by Other route 4 (four) times daily. Accu-check multiclix lancets- use as directed   OSTEO BI-FLEX TRIPLE STRENGTH PO Take 2 tablets  by mouth daily.   PROBIOTIC DAILY PO Take by mouth.   rosuvastatin 40 MG tablet Commonly known as: CRESTOR TAKE 1 TABLET(40 MG) BY MOUTH DAILY What changed: See the new instructions.   traMADol 50 MG tablet Commonly known as: ULTRAM Take 1 tablet (50 mg total) by mouth every 6 (six) hours as needed for severe pain.   vismodegib 150 MG capsule Commonly known as: ERIVEDGE Take by mouth.   vitamin C 1000 MG  tablet Take 1,000 mg by mouth daily.   Welchol 625 MG tablet Generic drug: colesevelam TAKE 3 TABLETS(1875 MG) BY MOUTH TWICE DAILY WITH A MEAL What changed: See the new instructions.       No Known Allergies  Consultations:  none   Procedures/Studies: CT ANGIO CHEST PE W OR WO CONTRAST  Result Date: 04/25/2019 CLINICAL DATA:  Metastatic lung cancer, difficulty breathing/shortness of breath, nausea/vomiting x2 days, elevated D-dimer EXAM: CT ANGIOGRAPHY CHEST WITH CONTRAST TECHNIQUE: Multidetector CT imaging of the chest was performed using the standard protocol during bolus administration of intravenous contrast. Multiplanar CT image reconstructions and MIPs were obtained to evaluate the vascular anatomy. CONTRAST:  19mL OMNIPAQUE IOHEXOL 350 MG/ML SOLN COMPARISON:  CT abdomen/pelvis dated 04/24/2019. CT chest dated 06/18/2016. FINDINGS: Cardiovascular: Satisfactory opacification the bilateral pulmonary arteries to the lobar level. No evidence of pulmonary embolism. No evidence of thoracic aortic aneurysm or dissection. Mild atherosclerotic calcifications aortic root. The heart is normal in size. No pericardial effusion. Mitral valve annular calcifications. Coronary atherosclerosis the LAD and left circumflex. Mediastinum/Nodes: Small mediastinal lymph nodes, including a 9 mm short axis right paratracheal node and 8 mm short axis subcarinal node, within the upper limits of normal. Visualized left thyroid is unremarkable. Right thyroid is not discretely visualized and may be surgically absent. Lungs/Pleura: Radiation changes in the right upper lobe/suprahilar region. Radiation changes in the left lower lobe. Hyperdense nodular lesion at the left lung apex (series 5/image 34), new from the prior, possibly reflecting talc pleurodesis. However, there is nodular pleural thickening which is a new/progressive from the prior, for example, overlying the lateral left upper lobe/lingula, measuring  approximately 7.9 x 2.3 cm (series 4/image 61). Additional 15 mm nodule along the left hemidiaphragm (series 4/image 83) and 2.0 x 4.9 cm nodule along the left hemidiaphragm at the left lung base (series 4/image 89), new/progressive. Additional chest wall nodularity, some of which involve the ribs and are described below. However, there is also a 2.2 x 2.9 cm lesion at the left 9/10 rib interspace (series 4/image 98) and a 1.4 x 2.3 cm lesion at the left 11/12 rib interspace (series 4/image 102) in the posterior chest wall. Upper Abdomen: Visualized upper abdomen is notable for layering gallstones (series 5/image 308), a 1.8 cm medial right upper pole renal cyst, and a 2.0 cm left adrenal nodule which is chronic and favors a benign adrenal adenoma. Musculoskeletal: Old left anterior 5th rib fracture. Abnormal underlying pleural-based tumor in the left lateral chest wall (series 5/image 179). Additional old left rib fracture deformities. 3.6 x 8.3 cm left chest wall mass with metastasis involving the left anterior 6th rib (series 5/image 225). 3.1 x 5.4 cm left lateral chest wall mass with metastasis involving the left lateral 7th rib (series 5/image 258). Degenerative changes of the thoracic spine. Review of the MIP images confirms the above findings. IMPRESSION: No evidence of pulmonary embolism. 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 Small mediastinal lymph nodes, within the upper limits of  normal. Left adrenal nodule, chronic, favoring a benign adrenal adenoma. Consider PET-CT for further evaluation as clinically warranted. Aortic Atherosclerosis (ICD10-I70.0). Electronically Signed   By: Julian Hy M.D.   On: 04/25/2019 11:29   CT Abdomen Pelvis W Contrast  Result Date: 04/24/2019 CLINICAL DATA:  Nausea and vomiting x2 days. EXAM: CT ABDOMEN AND PELVIS WITH CONTRAST TECHNIQUE: Multidetector CT imaging of the abdomen and pelvis was performed using the  standard protocol following bolus administration of intravenous contrast. CONTRAST:  177mL OMNIPAQUE IOHEXOL 300 MG/ML  SOLN COMPARISON:  Chest CT, dated June 18, 2016, is available for comparison. FINDINGS: Lower chest: Mild atelectasis is seen within the left lung base. A stable 1.5 cm noncalcified lung nodule (approximately 3.04 Hounsfield units) is seen within the posteromedial aspect of the left lower lobe (axial CT image 20, CT series number 2). Hepatobiliary: No focal liver abnormality is seen. Subcentimeter gallstones are seen within the lumen of an otherwise normal-appearing gallbladder. Pancreas: Unremarkable. No pancreatic ductal dilatation or surrounding inflammatory changes. Spleen: Normal in size without focal abnormality. Adrenals/Urinary Tract: The right adrenal gland is normal in size and appearance. A stable 2.4 cm x 1.9 cm isodense left adrenal mass is seen. Kidneys are normal in size, without renal calculi or hydronephrosis. Several right renal cysts are seen. The largest measures approximately 3.2 cm x 2.3 cm and is seen along the lower pole of the right kidney. Bladder is unremarkable. Stomach/Bowel: There is a small hiatal hernia. Appendix appears normal. No evidence of bowel wall thickening, distention, or inflammatory changes. Vascular/Lymphatic: Mild aortic atherosclerosis. No enlarged abdominal or pelvic lymph nodes. Reproductive: The prostate gland is mildly enlarged. Other: No abdominal wall hernia or abnormality. No abdominopelvic ascites. Musculoskeletal: Chronic fifth, sixth and seventh left rib fractures are seen. Multilevel degenerative changes seen throughout the lumbar spine. IMPRESSION: 1. Cholelithiasis. 2. Stable left adrenal mass which may represent an adrenal adenoma. 3. Stable 1.5 cm noncalcified left lower lobe lung nodule. 4. Small hiatal hernia. Electronically Signed   By: Virgina Norfolk M.D.   On: 04/24/2019 20:08   DG CHEST PORT 1 VIEW  Result Date:  04/25/2019 CLINICAL DATA:  64 year old male with history of acute respiratory failure. Cough. EXAM: PORTABLE CHEST 1 VIEW COMPARISON:  Chest x-ray 04/24/2019. FINDINGS: Partially loculated small to moderate left pleural effusion appears slightly increased compared to the prior study, with worsening left basilar opacity which may reflect increasing atelectasis and/or consolidation in the left lower lobe. Linear opacity in the right mid lung likely to represent some developing subsegmental atelectasis. No right pleural effusion. No evidence of pulmonary edema. Heart size is normal. Upper mediastinal contours are within normal limits. IMPRESSION: 1. Enlarging partially loculated small to moderate left pleural effusion with worsening atelectasis and/or consolidation in the left lower lobe. 2. Increasing subsegmental atelectasis in the right mid lung. Electronically Signed   By: Vinnie Langton M.D.   On: 04/25/2019 09:30   DG Chest Portable 1 View  Result Date: 04/24/2019 CLINICAL DATA:  Cough for 2 days. EXAM: PORTABLE CHEST 1 VIEW COMPARISON:  04/29/2017 and prior radiographs FINDINGS: The cardiomediastinal silhouette is unremarkable. LEFT basilar and RIGHT perihilar atelectasis/scarring again noted. There is no evidence of focal airspace disease, pulmonary edema, suspicious pulmonary nodule/mass, pleural effusion, or pneumothorax. No acute bony abnormalities are identified. IMPRESSION: No active disease. Electronically Signed   By: Margarette Canada M.D.   On: 04/24/2019 17:30         Discharge Exam: Vitals:   04/27/19 2113  04/28/19 0454  BP: (!) 163/81 (!) 158/79  Pulse: 72 65  Resp: 18   Temp: 97.8 F (36.6 C) 98.5 F (36.9 C)  SpO2: 99% 97%   Vitals:   04/27/19 1104 04/27/19 1452 04/27/19 2113 04/28/19 0454  BP: 120/61 127/63 (!) 163/81 (!) 158/79  Pulse: 69 72 72 65  Resp:  20 18   Temp:  97.8 F (36.6 C) 97.8 F (36.6 C) 98.5 F (36.9 C)  TempSrc:  Oral Oral Oral  SpO2:  95% 99% 97%   Weight:      Height:        General: Pt is alert, awake, not in acute distress Cardiovascular: RRR, S1/S2 +, no rubs, no gallops Respiratory: bibasilar rales, L>R Abdominal: Soft, NT, ND, bowel sounds + Extremities: 1+LE edema, no cyanosis   The results of significant diagnostics from this hospitalization (including imaging, microbiology, ancillary and laboratory) are listed below for reference.    Significant Diagnostic Studies: CT ANGIO CHEST PE W OR WO CONTRAST  Result Date: 04/25/2019 CLINICAL DATA:  Metastatic lung cancer, difficulty breathing/shortness of breath, nausea/vomiting x2 days, elevated D-dimer EXAM: CT ANGIOGRAPHY CHEST WITH CONTRAST TECHNIQUE: Multidetector CT imaging of the chest was performed using the standard protocol during bolus administration of intravenous contrast. Multiplanar CT image reconstructions and MIPs were obtained to evaluate the vascular anatomy. CONTRAST:  15mL OMNIPAQUE IOHEXOL 350 MG/ML SOLN COMPARISON:  CT abdomen/pelvis dated 04/24/2019. CT chest dated 06/18/2016. FINDINGS: Cardiovascular: Satisfactory opacification the bilateral pulmonary arteries to the lobar level. No evidence of pulmonary embolism. No evidence of thoracic aortic aneurysm or dissection. Mild atherosclerotic calcifications aortic root. The heart is normal in size. No pericardial effusion. Mitral valve annular calcifications. Coronary atherosclerosis the LAD and left circumflex. Mediastinum/Nodes: Small mediastinal lymph nodes, including a 9 mm short axis right paratracheal node and 8 mm short axis subcarinal node, within the upper limits of normal. Visualized left thyroid is unremarkable. Right thyroid is not discretely visualized and may be surgically absent. Lungs/Pleura: Radiation changes in the right upper lobe/suprahilar region. Radiation changes in the left lower lobe. Hyperdense nodular lesion at the left lung apex (series 5/image 34), new from the prior, possibly reflecting talc  pleurodesis. However, there is nodular pleural thickening which is a new/progressive from the prior, for example, overlying the lateral left upper lobe/lingula, measuring approximately 7.9 x 2.3 cm (series 4/image 61). Additional 15 mm nodule along the left hemidiaphragm (series 4/image 83) and 2.0 x 4.9 cm nodule along the left hemidiaphragm at the left lung base (series 4/image 89), new/progressive. Additional chest wall nodularity, some of which involve the ribs and are described below. However, there is also a 2.2 x 2.9 cm lesion at the left 9/10 rib interspace (series 4/image 98) and a 1.4 x 2.3 cm lesion at the left 11/12 rib interspace (series 4/image 102) in the posterior chest wall. Upper Abdomen: Visualized upper abdomen is notable for layering gallstones (series 5/image 308), a 1.8 cm medial right upper pole renal cyst, and a 2.0 cm left adrenal nodule which is chronic and favors a benign adrenal adenoma. Musculoskeletal: Old left anterior 5th rib fracture. Abnormal underlying pleural-based tumor in the left lateral chest wall (series 5/image 179). Additional old left rib fracture deformities. 3.6 x 8.3 cm left chest wall mass with metastasis involving the left anterior 6th rib (series 5/image 225). 3.1 x 5.4 cm left lateral chest wall mass with metastasis involving the left lateral 7th rib (series 5/image 258). Degenerative changes of the thoracic  spine. Review of the MIP images confirms the above findings. IMPRESSION: No evidence of pulmonary embolism. 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 Small mediastinal lymph nodes, within the upper limits of normal. Left adrenal nodule, chronic, favoring a benign adrenal adenoma. Consider PET-CT for further evaluation as clinically warranted. Aortic Atherosclerosis (ICD10-I70.0). Electronically Signed   By: Julian Hy M.D.   On: 04/25/2019 11:29   CT Abdomen Pelvis W Contrast  Result Date:  04/24/2019 CLINICAL DATA:  Nausea and vomiting x2 days. EXAM: CT ABDOMEN AND PELVIS WITH CONTRAST TECHNIQUE: Multidetector CT imaging of the abdomen and pelvis was performed using the standard protocol following bolus administration of intravenous contrast. CONTRAST:  144mL OMNIPAQUE IOHEXOL 300 MG/ML  SOLN COMPARISON:  Chest CT, dated June 18, 2016, is available for comparison. FINDINGS: Lower chest: Mild atelectasis is seen within the left lung base. A stable 1.5 cm noncalcified lung nodule (approximately 3.04 Hounsfield units) is seen within the posteromedial aspect of the left lower lobe (axial CT image 20, CT series number 2). Hepatobiliary: No focal liver abnormality is seen. Subcentimeter gallstones are seen within the lumen of an otherwise normal-appearing gallbladder. Pancreas: Unremarkable. No pancreatic ductal dilatation or surrounding inflammatory changes. Spleen: Normal in size without focal abnormality. Adrenals/Urinary Tract: The right adrenal gland is normal in size and appearance. A stable 2.4 cm x 1.9 cm isodense left adrenal mass is seen. Kidneys are normal in size, without renal calculi or hydronephrosis. Several right renal cysts are seen. The largest measures approximately 3.2 cm x 2.3 cm and is seen along the lower pole of the right kidney. Bladder is unremarkable. Stomach/Bowel: There is a small hiatal hernia. Appendix appears normal. No evidence of bowel wall thickening, distention, or inflammatory changes. Vascular/Lymphatic: Mild aortic atherosclerosis. No enlarged abdominal or pelvic lymph nodes. Reproductive: The prostate gland is mildly enlarged. Other: No abdominal wall hernia or abnormality. No abdominopelvic ascites. Musculoskeletal: Chronic fifth, sixth and seventh left rib fractures are seen. Multilevel degenerative changes seen throughout the lumbar spine. IMPRESSION: 1. Cholelithiasis. 2. Stable left adrenal mass which may represent an adrenal adenoma. 3. Stable 1.5 cm  noncalcified left lower lobe lung nodule. 4. Small hiatal hernia. Electronically Signed   By: Virgina Norfolk M.D.   On: 04/24/2019 20:08   DG CHEST PORT 1 VIEW  Result Date: 04/25/2019 CLINICAL DATA:  64 year old male with history of acute respiratory failure. Cough. EXAM: PORTABLE CHEST 1 VIEW COMPARISON:  Chest x-ray 04/24/2019. FINDINGS: Partially loculated small to moderate left pleural effusion appears slightly increased compared to the prior study, with worsening left basilar opacity which may reflect increasing atelectasis and/or consolidation in the left lower lobe. Linear opacity in the right mid lung likely to represent some developing subsegmental atelectasis. No right pleural effusion. No evidence of pulmonary edema. Heart size is normal. Upper mediastinal contours are within normal limits. IMPRESSION: 1. Enlarging partially loculated small to moderate left pleural effusion with worsening atelectasis and/or consolidation in the left lower lobe. 2. Increasing subsegmental atelectasis in the right mid lung. Electronically Signed   By: Vinnie Langton M.D.   On: 04/25/2019 09:30   DG Chest Portable 1 View  Result Date: 04/24/2019 CLINICAL DATA:  Cough for 2 days. EXAM: PORTABLE CHEST 1 VIEW COMPARISON:  04/29/2017 and prior radiographs FINDINGS: The cardiomediastinal silhouette is unremarkable. LEFT basilar and RIGHT perihilar atelectasis/scarring again noted. There is no evidence of focal airspace disease, pulmonary edema, suspicious pulmonary nodule/mass, pleural effusion, or pneumothorax. No acute bony  abnormalities are identified. IMPRESSION: No active disease. Electronically Signed   By: Margarette Canada M.D.   On: 04/24/2019 17:30     Microbiology: Recent Results (from the past 240 hour(s))  Novel Coronavirus, NAA ([facility])     Status: None   Collection Time: 04/24/19  9:49 AM   Specimen: Nasopharyngeal(NP) swabs in vial transport medium   NASOPHARYNGE  TESTING  Result Value Ref Range  Status   SARS-CoV-2, NAA Not Detected Not Detected Final    Comment: This nucleic acid amplification test was developed and its performance characteristics determined by [employer]. Nucleic acid amplification tests include RT-PCR and TMA. This test has not been FDA cleared or approved. This test has been authorized by FDA under an Emergency Use Authorization (EUA). This test is only authorized for the duration of time the declaration that circumstances exist justifying the authorization of the emergency use of in vitro diagnostic tests for detection of SARS-CoV-2 virus and/or diagnosis of COVID-19 infection under section 564(b)(1) of the Act, 21 U.S.C. 751WCH-8(N) (1), unless the authorization is terminated or revoked sooner. When diagnostic testing is negative, the possibility of a false negative result should be considered in the context of a patient's recent exposures and the presence of clinical signs and symptoms consistent with COVID-19. An individual without symptoms of COVID-19 and who is not shedding SARS-CoV-2 virus wo uld expect to have a negative (not detected) result in this assay.   Blood Culture (routine x 2)     Status: None (Preliminary result)   Collection Time: 04/24/19  9:39 PM   Specimen: BLOOD  Result Value Ref Range Status   Specimen Description BLOOD RIGHT ANTECUBITAL  Final   Special Requests   Final    BOTTLES DRAWN AEROBIC AND ANAEROBIC Blood Culture adequate volume   Culture   Final    NO GROWTH 4 DAYS Performed at [facility], [address] [place], [place] [address]    Report Status PENDING  Incomplete  Blood Culture (routine x 2)     Status: None (Preliminary result)   Collection Time: 04/24/19  9:48 PM   Specimen: BLOOD  Result Value Ref Range Status   Specimen Description BLOOD RIGHT ANTECUBITAL  Final   Special Requests   Final    BOTTLES DRAWN AEROBIC AND ANAEROBIC Blood Culture adequate volume   Culture   Final    NO GROWTH 4  DAYS Performed at [facility], [address], [place],  [address]    Report Status PENDING  Incomplete  Respiratory Panel by RT PCR (Flu A&B, Covid) - Nasopharyngeal Swab     Status: None   Collection Time: 04/25/19 12:01 AM   Specimen: Nasopharyngeal Swab  Result Value Ref Range Status   SARS Coronavirus 2 by RT PCR NEGATIVE NEGATIVE Final    Comment: (NOTE) SARS-CoV-2 target nucleic acids are NOT DETECTED. The SARS-CoV-2 RNA is generally detectable in upper respiratoy specimens during the acute phase of infection. The lowest concentration of SARS-CoV-2 viral copies this assay can detect is 131 copies/mL. A negative result does not preclude SARS-Cov-2 infection and should not be used as the sole basis for treatment or other patient management decisions. A negative result may occur with  improper specimen collection/handling, submission of specimen other than nasopharyngeal swab, presence of viral mutation(s) within the areas targeted by this assay, and inadequate number of viral copies (<131 copies/mL). A negative result must be combined with clinical observations, patient history, and epidemiological information. The expected result is Negative.  Fact Sheet for Patients:  PinkCheek.be Fact Sheet for Healthcare Providers:  GravelBags.it This test is not yet ap proved or cleared by the [place] FDA and  has been authorized for detection and/or diagnosis of SARS-CoV-2 by FDA under an Emergency Use Authorization (EUA). This EUA will remain  in effect (meaning this test can be used) for the duration of the COVID-19 declaration under Section 564(b)(1) of the Act, 21 U.S.C. section 360bbb-3(b)(1), unless the authorization is terminated or revoked sooner.    Influenza A by PCR NEGATIVE NEGATIVE Final   Influenza B by PCR NEGATIVE NEGATIVE Final    Comment: (NOTE) The Xpert Xpress SARS-CoV-2/FLU/RSV assay is intended  as an aid in  the diagnosis of influenza from Nasopharyngeal swab specimens and  should not be used as a sole basis for treatment. Nasal washings and  aspirates are unacceptable for Xpert Xpress SARS-CoV-2/FLU/RSV  testing. Fact Sheet for Patients: PinkCheek.be Fact Sheet for Healthcare Providers: GravelBags.it This test is not yet approved or cleared by the [place] FDA and  has been authorized for detection and/or diagnosis of SARS-CoV-2 by  FDA under an Emergency Use Authorization (EUA). This EUA will remain  in effect (meaning this test can be used) for the duration of the  Covid-19 declaration under Section 564(b)(1) of the Act, 21  U.S.C. section 360bbb-3(b)(1), unless the authorization is  terminated or revoked. Performed at [facility], [address], [place], [place] [address]   MRSA PCR Screening     Status: None   Collection Time: 04/26/19  8:34 AM   Specimen: Nasal Mucosa; Nasopharyngeal  Result Value Ref Range Status   MRSA by PCR NEGATIVE NEGATIVE Final    Comment:        The GeneXpert MRSA Assay (FDA approved for NASAL specimens only), is one component of a comprehensive MRSA colonization surveillance program. It is not intended to diagnose MRSA infection nor to guide or monitor treatment for MRSA infections. Performed at [facility], [address], [place], [place] [address]   Culture, Urine     Status: None   Collection Time: 04/26/19  8:37 PM   Specimen: Urine, Clean Catch  Result Value Ref Range Status   Specimen Description   Final    URINE, CLEAN CATCH Performed at [facility], [address], [place], [place] [address]    Special Requests   Final    NONE Performed at [facility], [address], [place], [place] [address]    Culture   Final    NO GROWTH Performed at [facility], [place] [address], [place], [place] [address]    Report Status 04/28/2019 FINAL  Final      Labs: Basic Metabolic Panel: Recent Labs  Lab 04/24/19 1710 04/24/19 1710 04/25/19 0252 04/25/19 0252 04/26/19 0550 04/26/19 0550 04/27/19 0556 04/28/19 0531  NA 129*  --  135  --  134*  --  135 137  K 3.4*   < > 4.2   < > 3.6   < > 3.4* 3.5  CL 95*  --  102  --  100  --  101 101  CO2 25  --  25  --  25  --  26 27  GLUCOSE 310*  --  274*  --  236*  --  178* 173*  BUN 23  --  22  --  27*  --  27* 20  CREATININE 1.15  --  1.06  --  0.98  --  0.92 0.85  CALCIUM 9.2  --  8.5*  --  8.4*  --  8.1* 8.3*  MG  --   --   --   --   --   --   --  2.1   < > = values in this interval not displayed.   Liver Function Tests: Recent Labs  Lab 04/24/19 1710 04/25/19 0252 04/26/19 0550 04/27/19 0556 04/28/19 0531  AST 55* 43* 31 23 23   ALT 108* 92* 72* 55* 46*  ALKPHOS 58 58 68 85 99  BILITOT 2.0* 2.8* 1.2 1.0 0.8  PROT 6.4* 6.1* 5.9* 5.7* 5.5*  ALBUMIN 3.3* 3.2* 2.9* 2.8* 2.8*   Recent Labs  Lab 04/24/19 1710  LIPASE 16   No results for input(s): AMMONIA in the last 168 hours. CBC: Recent Labs  Lab 04/24/19 1710 04/25/19 0252 04/26/19 0550 04/27/19 0556 04/28/19 0531  WBC 6.0 5.8 5.9 4.5 5.6  NEUTROABS 5.6 5.2 4.9 3.3 3.9  HGB 13.6 13.2 13.3 13.2 13.3  HCT 39.4 39.2 39.4 38.8* 39.4  MCV 87.8 88.5 87.2 87.4 87.8  PLT 117* 112* 134* 134* 149*   Cardiac Enzymes: No results for input(s): CKTOTAL, CKMB, CKMBINDEX, TROPONINI in the last 168 hours. BNP: Invalid input(s): POCBNP CBG: Recent Labs  Lab 04/27/19 0744 04/27/19 1129 04/27/19 1635 04/27/19 2114 04/28/19 0734  GLUCAP 156* 215* 222* 218* 157*    Time coordinating discharge:  36 minutes  Signed:  Orson Eva, DO [facility] Pager: [phone] 04/28/2019, 8:14 AM

## 2019-04-29 LAB — CULTURE, BLOOD (ROUTINE X 2)
Culture: NO GROWTH
Culture: NO GROWTH
Special Requests: ADEQUATE
Special Requests: ADEQUATE

## 2019-05-06 ENCOUNTER — Other Ambulatory Visit: Payer: Self-pay | Admitting: Family Medicine

## 2019-05-16 ENCOUNTER — Other Ambulatory Visit: Payer: Self-pay | Admitting: Family Medicine

## 2019-05-19 DIAGNOSIS — J90 Pleural effusion, not elsewhere classified: Secondary | ICD-10-CM | POA: Diagnosis not present

## 2019-05-19 DIAGNOSIS — K5900 Constipation, unspecified: Secondary | ICD-10-CM | POA: Diagnosis not present

## 2019-05-19 DIAGNOSIS — C7800 Secondary malignant neoplasm of unspecified lung: Secondary | ICD-10-CM | POA: Diagnosis not present

## 2019-05-19 DIAGNOSIS — C4441 Basal cell carcinoma of skin of scalp and neck: Secondary | ICD-10-CM | POA: Diagnosis not present

## 2019-05-19 DIAGNOSIS — C779 Secondary and unspecified malignant neoplasm of lymph node, unspecified: Secondary | ICD-10-CM | POA: Diagnosis not present

## 2019-05-19 DIAGNOSIS — J701 Chronic and other pulmonary manifestations due to radiation: Secondary | ICD-10-CM | POA: Diagnosis not present

## 2019-05-19 DIAGNOSIS — C7951 Secondary malignant neoplasm of bone: Secondary | ICD-10-CM | POA: Diagnosis not present

## 2019-05-29 ENCOUNTER — Other Ambulatory Visit: Payer: Self-pay | Admitting: Family Medicine

## 2019-06-16 DIAGNOSIS — Z85828 Personal history of other malignant neoplasm of skin: Secondary | ICD-10-CM | POA: Diagnosis not present

## 2019-06-16 DIAGNOSIS — C7800 Secondary malignant neoplasm of unspecified lung: Secondary | ICD-10-CM | POA: Diagnosis not present

## 2019-06-16 DIAGNOSIS — Z79899 Other long term (current) drug therapy: Secondary | ICD-10-CM | POA: Diagnosis not present

## 2019-06-16 DIAGNOSIS — C4441 Basal cell carcinoma of skin of scalp and neck: Secondary | ICD-10-CM | POA: Diagnosis not present

## 2019-06-16 DIAGNOSIS — Z87891 Personal history of nicotine dependence: Secondary | ICD-10-CM | POA: Diagnosis not present

## 2019-06-16 DIAGNOSIS — R432 Parageusia: Secondary | ICD-10-CM | POA: Diagnosis not present

## 2019-06-16 DIAGNOSIS — K5903 Drug induced constipation: Secondary | ICD-10-CM | POA: Diagnosis not present

## 2019-06-16 DIAGNOSIS — C782 Secondary malignant neoplasm of pleura: Secondary | ICD-10-CM | POA: Diagnosis not present

## 2019-06-27 ENCOUNTER — Other Ambulatory Visit: Payer: Self-pay | Admitting: Family Medicine

## 2019-07-05 ENCOUNTER — Other Ambulatory Visit: Payer: Self-pay | Admitting: Family Medicine

## 2019-07-07 DIAGNOSIS — C782 Secondary malignant neoplasm of pleura: Secondary | ICD-10-CM | POA: Diagnosis not present

## 2019-07-07 DIAGNOSIS — K862 Cyst of pancreas: Secondary | ICD-10-CM | POA: Diagnosis not present

## 2019-07-07 DIAGNOSIS — C7802 Secondary malignant neoplasm of left lung: Secondary | ICD-10-CM | POA: Diagnosis not present

## 2019-07-07 DIAGNOSIS — C7951 Secondary malignant neoplasm of bone: Secondary | ICD-10-CM | POA: Diagnosis not present

## 2019-07-07 DIAGNOSIS — C4441 Basal cell carcinoma of skin of scalp and neck: Secondary | ICD-10-CM | POA: Diagnosis not present

## 2019-07-07 DIAGNOSIS — R918 Other nonspecific abnormal finding of lung field: Secondary | ICD-10-CM | POA: Diagnosis not present

## 2019-07-28 ENCOUNTER — Other Ambulatory Visit: Payer: Self-pay | Admitting: Family Medicine

## 2019-08-01 ENCOUNTER — Other Ambulatory Visit: Payer: Self-pay | Admitting: Family Medicine

## 2019-08-03 ENCOUNTER — Other Ambulatory Visit: Payer: Self-pay | Admitting: Family Medicine

## 2019-08-04 DIAGNOSIS — C7800 Secondary malignant neoplasm of unspecified lung: Secondary | ICD-10-CM | POA: Diagnosis not present

## 2019-08-04 DIAGNOSIS — C7951 Secondary malignant neoplasm of bone: Secondary | ICD-10-CM | POA: Diagnosis not present

## 2019-08-04 DIAGNOSIS — C7802 Secondary malignant neoplasm of left lung: Secondary | ICD-10-CM | POA: Diagnosis not present

## 2019-08-04 DIAGNOSIS — C782 Secondary malignant neoplasm of pleura: Secondary | ICD-10-CM | POA: Diagnosis not present

## 2019-08-04 DIAGNOSIS — K5903 Drug induced constipation: Secondary | ICD-10-CM | POA: Diagnosis not present

## 2019-09-01 DIAGNOSIS — C7951 Secondary malignant neoplasm of bone: Secondary | ICD-10-CM | POA: Diagnosis not present

## 2019-09-01 DIAGNOSIS — R438 Other disturbances of smell and taste: Secondary | ICD-10-CM | POA: Diagnosis not present

## 2019-09-01 DIAGNOSIS — K5903 Drug induced constipation: Secondary | ICD-10-CM | POA: Diagnosis not present

## 2019-09-01 DIAGNOSIS — K5900 Constipation, unspecified: Secondary | ICD-10-CM | POA: Diagnosis not present

## 2019-09-01 DIAGNOSIS — Z923 Personal history of irradiation: Secondary | ICD-10-CM | POA: Diagnosis not present

## 2019-09-01 DIAGNOSIS — C7801 Secondary malignant neoplasm of right lung: Secondary | ICD-10-CM | POA: Diagnosis not present

## 2019-09-01 DIAGNOSIS — C44320 Squamous cell carcinoma of skin of unspecified parts of face: Secondary | ICD-10-CM | POA: Diagnosis not present

## 2019-09-01 DIAGNOSIS — Z79899 Other long term (current) drug therapy: Secondary | ICD-10-CM | POA: Diagnosis not present

## 2019-09-01 DIAGNOSIS — C782 Secondary malignant neoplasm of pleura: Secondary | ICD-10-CM | POA: Diagnosis not present

## 2019-09-01 DIAGNOSIS — C7802 Secondary malignant neoplasm of left lung: Secondary | ICD-10-CM | POA: Diagnosis not present

## 2019-09-01 DIAGNOSIS — Z9221 Personal history of antineoplastic chemotherapy: Secondary | ICD-10-CM | POA: Diagnosis not present

## 2019-09-01 DIAGNOSIS — M8458XD Pathological fracture in neoplastic disease, other specified site, subsequent encounter for fracture with routine healing: Secondary | ICD-10-CM | POA: Diagnosis not present

## 2019-09-01 DIAGNOSIS — Z9222 Personal history of monoclonal drug therapy: Secondary | ICD-10-CM | POA: Diagnosis not present

## 2019-09-01 DIAGNOSIS — Z9889 Other specified postprocedural states: Secondary | ICD-10-CM | POA: Diagnosis not present

## 2019-09-01 DIAGNOSIS — J701 Chronic and other pulmonary manifestations due to radiation: Secondary | ICD-10-CM | POA: Diagnosis not present

## 2019-09-01 DIAGNOSIS — J910 Malignant pleural effusion: Secondary | ICD-10-CM | POA: Diagnosis not present

## 2019-09-01 DIAGNOSIS — C770 Secondary and unspecified malignant neoplasm of lymph nodes of head, face and neck: Secondary | ICD-10-CM | POA: Diagnosis not present

## 2019-09-03 ENCOUNTER — Other Ambulatory Visit: Payer: Self-pay | Admitting: Family Medicine

## 2019-09-14 ENCOUNTER — Telehealth: Payer: Self-pay | Admitting: Family Medicine

## 2019-09-14 NOTE — Telephone Encounter (Signed)
Caller : Steven Ibarra Back # [phone]  Pt states he is losing weight due to a new patient. Patient has Cancer and would like to come in to discuss glucose medication.  Patient states blood sugar is dropping too low.Patient would also like blood work ordered. Pt requested CPE , informed patient that Charlett Blake would have an appointment in December 2021.  Please Advise

## 2019-09-15 NOTE — Telephone Encounter (Signed)
Can you call can get him set up for a virtual and then we can get him scheduled for CPE.  We get him in with another provider sooner.

## 2019-09-18 NOTE — Telephone Encounter (Signed)
Called patient left detailed  message  °

## 2019-09-29 ENCOUNTER — Other Ambulatory Visit: Payer: Self-pay | Admitting: Family Medicine

## 2019-09-29 DIAGNOSIS — Z79899 Other long term (current) drug therapy: Secondary | ICD-10-CM | POA: Diagnosis not present

## 2019-09-29 DIAGNOSIS — Z87891 Personal history of nicotine dependence: Secondary | ICD-10-CM | POA: Diagnosis not present

## 2019-09-29 DIAGNOSIS — J948 Other specified pleural conditions: Secondary | ICD-10-CM | POA: Diagnosis not present

## 2019-09-29 DIAGNOSIS — R432 Parageusia: Secondary | ICD-10-CM | POA: Diagnosis not present

## 2019-09-29 DIAGNOSIS — C7802 Secondary malignant neoplasm of left lung: Secondary | ICD-10-CM | POA: Diagnosis not present

## 2019-09-29 DIAGNOSIS — C782 Secondary malignant neoplasm of pleura: Secondary | ICD-10-CM | POA: Diagnosis not present

## 2019-09-29 DIAGNOSIS — C7951 Secondary malignant neoplasm of bone: Secondary | ICD-10-CM | POA: Diagnosis not present

## 2019-09-29 DIAGNOSIS — C4441 Basal cell carcinoma of skin of scalp and neck: Secondary | ICD-10-CM | POA: Diagnosis not present

## 2019-10-25 ENCOUNTER — Other Ambulatory Visit: Payer: Self-pay | Admitting: Family Medicine

## 2019-10-27 DIAGNOSIS — Z9889 Other specified postprocedural states: Secondary | ICD-10-CM | POA: Diagnosis not present

## 2019-10-27 DIAGNOSIS — C779 Secondary and unspecified malignant neoplasm of lymph node, unspecified: Secondary | ICD-10-CM | POA: Diagnosis not present

## 2019-10-27 DIAGNOSIS — Z85828 Personal history of other malignant neoplasm of skin: Secondary | ICD-10-CM | POA: Diagnosis not present

## 2019-10-27 DIAGNOSIS — N400 Enlarged prostate without lower urinary tract symptoms: Secondary | ICD-10-CM | POA: Diagnosis not present

## 2019-10-27 DIAGNOSIS — R918 Other nonspecific abnormal finding of lung field: Secondary | ICD-10-CM | POA: Diagnosis not present

## 2019-10-27 DIAGNOSIS — C4441 Basal cell carcinoma of skin of scalp and neck: Secondary | ICD-10-CM | POA: Diagnosis not present

## 2019-10-27 DIAGNOSIS — C782 Secondary malignant neoplasm of pleura: Secondary | ICD-10-CM | POA: Diagnosis not present

## 2019-10-27 DIAGNOSIS — C7802 Secondary malignant neoplasm of left lung: Secondary | ICD-10-CM | POA: Diagnosis not present

## 2019-10-27 DIAGNOSIS — K5900 Constipation, unspecified: Secondary | ICD-10-CM | POA: Diagnosis not present

## 2019-10-27 DIAGNOSIS — C801 Malignant (primary) neoplasm, unspecified: Secondary | ICD-10-CM | POA: Diagnosis not present

## 2019-10-27 DIAGNOSIS — Z87891 Personal history of nicotine dependence: Secondary | ICD-10-CM | POA: Diagnosis not present

## 2019-10-27 DIAGNOSIS — Z79899 Other long term (current) drug therapy: Secondary | ICD-10-CM | POA: Diagnosis not present

## 2019-10-27 DIAGNOSIS — C7951 Secondary malignant neoplasm of bone: Secondary | ICD-10-CM | POA: Diagnosis not present

## 2019-11-06 ENCOUNTER — Other Ambulatory Visit: Payer: Self-pay | Admitting: Family Medicine

## 2019-11-11 ENCOUNTER — Other Ambulatory Visit: Payer: Self-pay | Admitting: Family Medicine

## 2019-12-08 DIAGNOSIS — R432 Parageusia: Secondary | ICD-10-CM | POA: Diagnosis not present

## 2019-12-08 DIAGNOSIS — C7951 Secondary malignant neoplasm of bone: Secondary | ICD-10-CM | POA: Diagnosis not present

## 2019-12-08 DIAGNOSIS — C4441 Basal cell carcinoma of skin of scalp and neck: Secondary | ICD-10-CM | POA: Diagnosis not present

## 2019-12-08 DIAGNOSIS — C782 Secondary malignant neoplasm of pleura: Secondary | ICD-10-CM | POA: Diagnosis not present

## 2019-12-08 DIAGNOSIS — K5900 Constipation, unspecified: Secondary | ICD-10-CM | POA: Diagnosis not present

## 2019-12-08 DIAGNOSIS — C779 Secondary and unspecified malignant neoplasm of lymph node, unspecified: Secondary | ICD-10-CM | POA: Diagnosis not present

## 2019-12-08 DIAGNOSIS — Z79899 Other long term (current) drug therapy: Secondary | ICD-10-CM | POA: Diagnosis not present

## 2019-12-08 DIAGNOSIS — Z87891 Personal history of nicotine dependence: Secondary | ICD-10-CM | POA: Diagnosis not present

## 2019-12-08 DIAGNOSIS — C7802 Secondary malignant neoplasm of left lung: Secondary | ICD-10-CM | POA: Diagnosis not present

## 2019-12-12 ENCOUNTER — Other Ambulatory Visit: Payer: Self-pay | Admitting: Family Medicine

## 2019-12-28 ENCOUNTER — Other Ambulatory Visit: Payer: Self-pay | Admitting: Family Medicine

## 2020-01-05 DIAGNOSIS — R5383 Other fatigue: Secondary | ICD-10-CM | POA: Diagnosis not present

## 2020-01-05 DIAGNOSIS — C7802 Secondary malignant neoplasm of left lung: Secondary | ICD-10-CM | POA: Diagnosis not present

## 2020-01-05 DIAGNOSIS — C7951 Secondary malignant neoplasm of bone: Secondary | ICD-10-CM | POA: Diagnosis not present

## 2020-01-05 DIAGNOSIS — J90 Pleural effusion, not elsewhere classified: Secondary | ICD-10-CM | POA: Diagnosis not present

## 2020-01-05 DIAGNOSIS — Z87891 Personal history of nicotine dependence: Secondary | ICD-10-CM | POA: Diagnosis not present

## 2020-01-05 DIAGNOSIS — R432 Parageusia: Secondary | ICD-10-CM | POA: Diagnosis not present

## 2020-01-05 DIAGNOSIS — Z79899 Other long term (current) drug therapy: Secondary | ICD-10-CM | POA: Diagnosis not present

## 2020-01-05 DIAGNOSIS — C782 Secondary malignant neoplasm of pleura: Secondary | ICD-10-CM | POA: Diagnosis not present

## 2020-01-05 DIAGNOSIS — R634 Abnormal weight loss: Secondary | ICD-10-CM | POA: Diagnosis not present

## 2020-01-24 ENCOUNTER — Other Ambulatory Visit: Payer: Self-pay | Admitting: Family Medicine

## 2020-02-02 DIAGNOSIS — Z923 Personal history of irradiation: Secondary | ICD-10-CM | POA: Diagnosis not present

## 2020-02-02 DIAGNOSIS — C779 Secondary and unspecified malignant neoplasm of lymph node, unspecified: Secondary | ICD-10-CM | POA: Diagnosis not present

## 2020-02-02 DIAGNOSIS — R110 Nausea: Secondary | ICD-10-CM | POA: Diagnosis not present

## 2020-02-02 DIAGNOSIS — R634 Abnormal weight loss: Secondary | ICD-10-CM | POA: Diagnosis not present

## 2020-02-02 DIAGNOSIS — Z85828 Personal history of other malignant neoplasm of skin: Secondary | ICD-10-CM | POA: Diagnosis not present

## 2020-02-02 DIAGNOSIS — J90 Pleural effusion, not elsewhere classified: Secondary | ICD-10-CM | POA: Diagnosis not present

## 2020-02-02 DIAGNOSIS — R432 Parageusia: Secondary | ICD-10-CM | POA: Diagnosis not present

## 2020-02-02 DIAGNOSIS — C4441 Basal cell carcinoma of skin of scalp and neck: Secondary | ICD-10-CM | POA: Diagnosis not present

## 2020-02-02 DIAGNOSIS — C782 Secondary malignant neoplasm of pleura: Secondary | ICD-10-CM | POA: Diagnosis not present

## 2020-02-02 DIAGNOSIS — C760 Malignant neoplasm of head, face and neck: Secondary | ICD-10-CM | POA: Diagnosis not present

## 2020-02-02 DIAGNOSIS — R5383 Other fatigue: Secondary | ICD-10-CM | POA: Diagnosis not present

## 2020-02-02 DIAGNOSIS — C7951 Secondary malignant neoplasm of bone: Secondary | ICD-10-CM | POA: Diagnosis not present

## 2020-02-02 DIAGNOSIS — C7801 Secondary malignant neoplasm of right lung: Secondary | ICD-10-CM | POA: Diagnosis not present

## 2020-02-02 DIAGNOSIS — Z87891 Personal history of nicotine dependence: Secondary | ICD-10-CM | POA: Diagnosis not present

## 2020-02-03 ENCOUNTER — Other Ambulatory Visit: Payer: Self-pay | Admitting: Family Medicine

## 2020-02-09 DIAGNOSIS — C7951 Secondary malignant neoplasm of bone: Secondary | ICD-10-CM | POA: Diagnosis not present

## 2020-02-09 DIAGNOSIS — R918 Other nonspecific abnormal finding of lung field: Secondary | ICD-10-CM | POA: Diagnosis not present

## 2020-02-09 DIAGNOSIS — R5383 Other fatigue: Secondary | ICD-10-CM | POA: Diagnosis not present

## 2020-02-09 DIAGNOSIS — C760 Malignant neoplasm of head, face and neck: Secondary | ICD-10-CM | POA: Diagnosis not present

## 2020-02-09 DIAGNOSIS — N138 Other obstructive and reflux uropathy: Secondary | ICD-10-CM | POA: Diagnosis not present

## 2020-02-09 DIAGNOSIS — R432 Parageusia: Secondary | ICD-10-CM | POA: Diagnosis not present

## 2020-02-09 DIAGNOSIS — N401 Enlarged prostate with lower urinary tract symptoms: Secondary | ICD-10-CM | POA: Diagnosis not present

## 2020-02-09 DIAGNOSIS — C7802 Secondary malignant neoplasm of left lung: Secondary | ICD-10-CM | POA: Diagnosis not present

## 2020-02-09 DIAGNOSIS — Z85828 Personal history of other malignant neoplasm of skin: Secondary | ICD-10-CM | POA: Diagnosis not present

## 2020-02-09 DIAGNOSIS — M899 Disorder of bone, unspecified: Secondary | ICD-10-CM | POA: Diagnosis not present

## 2020-02-09 DIAGNOSIS — K869 Disease of pancreas, unspecified: Secondary | ICD-10-CM | POA: Diagnosis not present

## 2020-02-09 DIAGNOSIS — C779 Secondary and unspecified malignant neoplasm of lymph node, unspecified: Secondary | ICD-10-CM | POA: Diagnosis not present

## 2020-03-08 DIAGNOSIS — Z4659 Encounter for fitting and adjustment of other gastrointestinal appliance and device: Secondary | ICD-10-CM | POA: Diagnosis not present

## 2020-03-08 DIAGNOSIS — C7802 Secondary malignant neoplasm of left lung: Secondary | ICD-10-CM | POA: Diagnosis not present

## 2020-03-08 DIAGNOSIS — Z923 Personal history of irradiation: Secondary | ICD-10-CM | POA: Diagnosis not present

## 2020-03-08 DIAGNOSIS — N179 Acute kidney failure, unspecified: Secondary | ICD-10-CM | POA: Diagnosis not present

## 2020-03-08 DIAGNOSIS — K5900 Constipation, unspecified: Secondary | ICD-10-CM | POA: Diagnosis not present

## 2020-03-08 DIAGNOSIS — Z794 Long term (current) use of insulin: Secondary | ICD-10-CM | POA: Diagnosis not present

## 2020-03-08 DIAGNOSIS — C7801 Secondary malignant neoplasm of right lung: Secondary | ICD-10-CM | POA: Diagnosis not present

## 2020-03-08 DIAGNOSIS — Z85828 Personal history of other malignant neoplasm of skin: Secondary | ICD-10-CM | POA: Diagnosis not present

## 2020-03-08 DIAGNOSIS — N170 Acute kidney failure with tubular necrosis: Secondary | ICD-10-CM | POA: Diagnosis not present

## 2020-03-08 DIAGNOSIS — R918 Other nonspecific abnormal finding of lung field: Secondary | ICD-10-CM | POA: Diagnosis not present

## 2020-03-08 DIAGNOSIS — C7951 Secondary malignant neoplasm of bone: Secondary | ICD-10-CM | POA: Diagnosis not present

## 2020-03-08 DIAGNOSIS — N281 Cyst of kidney, acquired: Secondary | ICD-10-CM | POA: Diagnosis not present

## 2020-03-08 DIAGNOSIS — E1165 Type 2 diabetes mellitus with hyperglycemia: Secondary | ICD-10-CM | POA: Diagnosis not present

## 2020-03-08 DIAGNOSIS — Z79899 Other long term (current) drug therapy: Secondary | ICD-10-CM | POA: Diagnosis not present

## 2020-03-08 DIAGNOSIS — Z87891 Personal history of nicotine dependence: Secondary | ICD-10-CM | POA: Diagnosis not present

## 2020-03-08 DIAGNOSIS — E039 Hypothyroidism, unspecified: Secondary | ICD-10-CM | POA: Diagnosis not present

## 2020-03-08 DIAGNOSIS — C4491 Basal cell carcinoma of skin, unspecified: Secondary | ICD-10-CM | POA: Diagnosis not present

## 2020-03-08 DIAGNOSIS — C801 Malignant (primary) neoplasm, unspecified: Secondary | ICD-10-CM | POA: Diagnosis not present

## 2020-03-08 DIAGNOSIS — C4441 Basal cell carcinoma of skin of scalp and neck: Secondary | ICD-10-CM | POA: Diagnosis not present

## 2020-03-08 DIAGNOSIS — K8000 Calculus of gallbladder with acute cholecystitis without obstruction: Secondary | ICD-10-CM | POA: Diagnosis not present

## 2020-03-08 DIAGNOSIS — E785 Hyperlipidemia, unspecified: Secondary | ICD-10-CM | POA: Diagnosis not present

## 2020-03-08 DIAGNOSIS — I10 Essential (primary) hypertension: Secondary | ICD-10-CM | POA: Diagnosis not present

## 2020-03-08 DIAGNOSIS — I959 Hypotension, unspecified: Secondary | ICD-10-CM | POA: Diagnosis not present

## 2020-03-08 DIAGNOSIS — C4492 Squamous cell carcinoma of skin, unspecified: Secondary | ICD-10-CM | POA: Diagnosis not present

## 2020-03-08 DIAGNOSIS — K810 Acute cholecystitis: Secondary | ICD-10-CM | POA: Diagnosis not present

## 2020-03-13 ENCOUNTER — Other Ambulatory Visit: Payer: Self-pay | Admitting: Family Medicine

## 2020-03-18 DIAGNOSIS — G4733 Obstructive sleep apnea (adult) (pediatric): Secondary | ICD-10-CM | POA: Diagnosis not present

## 2020-03-19 ENCOUNTER — Encounter: Payer: Self-pay | Admitting: Medical

## 2020-03-19 ENCOUNTER — Other Ambulatory Visit: Payer: BC Managed Care – PPO

## 2020-03-19 ENCOUNTER — Telehealth: Payer: Self-pay | Admitting: Family Medicine

## 2020-03-19 ENCOUNTER — Other Ambulatory Visit: Payer: Self-pay

## 2020-03-19 ENCOUNTER — Telehealth (INDEPENDENT_AMBULATORY_CARE_PROVIDER_SITE_OTHER): Payer: BC Managed Care – PPO | Admitting: Medical

## 2020-03-19 ENCOUNTER — Other Ambulatory Visit: Payer: Self-pay | Admitting: Family Medicine

## 2020-03-19 VITALS — BP 127/80 | HR 95

## 2020-03-19 DIAGNOSIS — E669 Obesity, unspecified: Secondary | ICD-10-CM

## 2020-03-19 DIAGNOSIS — D649 Anemia, unspecified: Secondary | ICD-10-CM

## 2020-03-19 DIAGNOSIS — E1169 Type 2 diabetes mellitus with other specified complication: Secondary | ICD-10-CM | POA: Diagnosis not present

## 2020-03-19 DIAGNOSIS — R7989 Other specified abnormal findings of blood chemistry: Secondary | ICD-10-CM

## 2020-03-19 DIAGNOSIS — K819 Cholecystitis, unspecified: Secondary | ICD-10-CM

## 2020-03-19 DIAGNOSIS — F419 Anxiety disorder, unspecified: Secondary | ICD-10-CM

## 2020-03-19 DIAGNOSIS — I10 Essential (primary) hypertension: Secondary | ICD-10-CM | POA: Diagnosis not present

## 2020-03-19 LAB — COMPLETE METABOLIC PANEL WITH GFR
AG Ratio: 1.7 (calc) (ref 1.0–2.5)
ALT: 13 U/L (ref 9–46)
AST: 17 U/L (ref 10–35)
Albumin: 3.6 g/dL (ref 3.6–5.1)
Alkaline phosphatase (APISO): 102 U/L (ref 35–144)
BUN: 13 mg/dL (ref 7–25)
CO2: 27 mmol/L (ref 20–32)
Calcium: 9.2 mg/dL (ref 8.6–10.3)
Chloride: 104 mmol/L (ref 98–110)
Creat: 0.97 mg/dL (ref 0.70–1.25)
GFR, Est African American: 96 mL/min/{1.73_m2} (ref >=60)
GFR, Est Non African American: 83 mL/min/{1.73_m2} (ref >=60)
Globulin: 2.1 g/dL (calc) (ref 1.9–3.7)
Glucose, Bld: 107 mg/dL — ABNORMAL HIGH (ref 65–99)
Potassium: 4.5 mmol/L (ref 3.5–5.3)
Sodium: 140 mmol/L (ref 135–146)
Total Bilirubin: 0.8 mg/dL (ref 0.2–1.2)
Total Protein: 5.7 g/dL — ABNORMAL LOW (ref 6.1–8.1)

## 2020-03-19 MED ORDER — ALPRAZOLAM 0.25 MG PO TABS: ORAL_TABLET | ORAL | 1 refills | Status: DC

## 2020-03-19 NOTE — Telephone Encounter (Signed)
So I refilled but he will need a follow up appt by February

## 2020-03-19 NOTE — Telephone Encounter (Signed)
Requesting: xanax Contract:6/19 UDS:04/19 Last Visit:11/2018 Next Visit:n/a Last Refill:11/2018  Please Advise

## 2020-03-19 NOTE — Telephone Encounter (Signed)
Medication:ALPRAZolam (XANAX) 0.25 MG tablet [[id number]]       Has the patient contacted their pharmacy?  (If no, request that the patient contact the pharmacy for the refill.) (If yes, when and what did the pharmacy advise?)     Preferred Pharmacy (with phone number or street name): [facility], [address] [place], [place] [address]  Phone:  [phone] Fax:  [phone]  DEA #:  [id number]     Agent: Please be advised that RX refills may take up to 3 business days. We ask that you follow-up with your pharmacy.

## 2020-03-19 NOTE — Telephone Encounter (Signed)
Appt made with Steven Ibarra 1/26

## 2020-03-19 NOTE — Addendum Note (Signed)
Addended by: Kelle Darting A on: 03/19/2020 03:07 PM   Modules accepted: Orders

## 2020-03-19 NOTE — Patient Instructions (Signed)
On 12/14 patient underwent cholecystostomy placement with IR; follow-up for tube exchange 06/04/20. Patient was empirically covered for cholangitis with Zosyn while inpatient and deescalated to ciprofloxacin and Flagyl for total of 10 days of antibiotic therapy.  Will get follow up cbc to asess infection fighting cells post hospitalization  Mild anemia on recent labs. Follow this as well when cbc done.  History of diabetes and sugars intermittently high during hospitalization.  Been advised to cut down to only 60 units of Lantus in a.m.  Some high sugars discharge.  Better now that he has resumed Lantus 80 in am to to 60 units in pm.  Will follow A1c on labs and coordinate with patient's PCP regarding regimen going forward.  History of hypertension and blood pressure is well controlled today even though her losartan was discontinued in the hospital.  Follow metabolic panel to see if kidney function appears better.  Recently told to discontinue fenofibrate.  Not sure why that was the case.  Will follow patient's liver enzymes and get fasting lipid panel.  Asked to please get scheduled for labs either tomorrow morning or Thursday morning. Advised to follow-up with PCP in 2 to 3 months.  After lab review might be sooner appointment.

## 2020-03-19 NOTE — Progress Notes (Signed)
Subjective:    Patient ID: Steven Ibarra, male    DOB: 1955-11-09, 64 y.o.   MRN: [id number]  HPI Virtual Visit via Video Note  I connected with Steven Ibarra on 03/19/20 at 11:00 AM EST by a video enabled telemedicine application and verified that I am speaking with the correct person using two identifiers.  Location: Patient: home Provider: home   I discussed the limitations of evaluation and management by telemedicine and the availability of in person appointments. The patient expressed understanding and agreed to proceed.  History of Present Illness: Pt in for hospitalized [facility] on 03-08-20  He  had recent acute cholecysitis. States he had drainage procedure and placed on antibiotics after being on iv antibiotics for about 5 days.   Pt just finished oral antibitocs yesterday. Pt was cipro and flagyl post procedure.  Pt states he has had drainage from area into bag attached to his side. He won't see surgeon until March 8,2021.    Pt does autoparts delivery. He is filing for disability in light of his serious medical problems.   Pt uses lantus. 100 units in am an 60 units before hospitalization. In hospital told to just use 60 units in the am pending follow up with [place]. But he notes when he followed that advise sugars easiily over 200.   Pt had cut down to 80 in am and 60 units approximate daily range.  Yesterday he did 80 in am and 51 pm. Sugar this morning 112 fasting. Pt has lost 50 lb since cancer dx.  He also was told to hold fenofibrate. Unclear as to reasoning?  Losartan stopped. His creatinine had been elevated in the hospital.    Did eventually find dc summary.  Hospital Course:  For full details, please see H&P, progress notes, consult notes and ancillary notes. Briefly, 64 year old male with a PMH of non-melanoma skin cancer of the face, basal and squamous skin cancer with mets to RUL and LLL currently on vismodegib who presented from clinic for  intractable nausea and vomiting since Thanksgiving found to have acute cholecystitis.   #Acute cholecystitis Hx of metastatic disease and initial presention with intractable nausea, vomiting, abdominal pain, and an inability to pass gas concerning for SBO.  CT CAP wc concerning for acute cholecystitis, which was confirmed by HIDA scan. EGS recommended consulting IR for percutaneous cholecystostomy tube placement, with plan to follow up in EGS clinic 4 to 6 weeks for further evaluation of possible elective cholecystectomy. On 12/14 patient underwent cholecystostomy placement with IR; follow-up for tube exchange 06/04/20. Patient was empirically covered for cholangitis with Zosyn while inpatient and deescalated to ciprofloxacin and Flagyl for total of 10 days of antibiotic therapy.   #AKI Renal ultrasound with no evidence of hydronephrosis. Left lower fossa still prostamegaly noted. Most likely etiology: ATN secondary to renal hypoperfusion with transient hypotension on 12/11. Kidney function improved with fluid resuscitation. On day of discharge Cr 1.9.   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   Discharge Follow-up Action Items: 1. Follow up with Oncology (Dr. Baltazar Najjar 12/29) 2. Follow up with IR (06/04/20), tube exchange 3. Follow up with EGS (tbd), consideration for elective cholecystectomy      Observations/Objective: General-no acute distress, pleasant, oriented. Lungs- on inspection lungs appear unlabored. Neck- no tracheal  deviation or jvd on inspection. Neuro- gross motor function appears intact.  Assessment and Plan: On 12/14 patient underwent cholecystostomy placement with IR; follow-up for tube exchange 06/04/20. Patient was empirically covered for cholangitis with Zosyn while inpatient and  deescalated to ciprofloxacin and Flagyl for total of 10 days of antibiotic therapy.  Will get follow up cbc to asess infection fighting cells post hospitalization  Mild anemia on recent labs. Follow this as well when cbc done.  History of diabetes and sugars intermittently high during hospitalization.  Been advised to cut down to only 60 units of Lantus in a.m.  Some high sugars discharge.  Better now that he has resumed Lantus 80 in am to to 60 units in pm.  Will follow A1c on labs and coordinate with patient's PCP regarding regimen going forward.  History of hypertension and blood pressure is well controlled today even though her losartan was discontinued in the hospital.  Follow metabolic panel to see if kidney function appears better.  Recently told to discontinue fenofibrate.  Not sure why that was the case.  Will follow patient's liver enzymes and get fasting lipid panel.  Asked to please get scheduled for labs either tomorrow morning or Thursday morning.  Advised to follow-up with PCP in 2 to 3 months.  After lab review might be sooner appointment.  Mackie Pai, PA-C   Time spent with patient today was 45   minutes which consisted of chart revdiew, discussing diagnosis, work up treatment and documentation.  Follow Up Instructions:    I discussed the assessment and treatment plan with the patient. The patient was provided an opportunity to ask questions and all were answered. The patient agreed with the plan and demonstrated an understanding of the instructions.   The patient was advised to call back or seek an in-person evaluation if the symptoms worsen or if the condition fails to improve as anticipated.  Time spent with patient today was 45   minutes which consisted of chart revdiew, discussing diagnosis, work up treatment and documentation.   Mackie Pai, PA-C    Review of Systems  Constitutional: Positive for fatigue.  HENT: Negative for congestion.   Respiratory:  Negative for apnea, cough, choking, shortness of breath and wheezing.   Cardiovascular: Negative for palpitations.  Gastrointestinal: Negative for abdominal pain and constipation.  Endocrine: Negative for polydipsia, polyphagia and polyuria.  Genitourinary: Negative for frequency.  Musculoskeletal: Negative for back pain.  Skin: Negative for rash.  Neurological: Negative for dizziness, speech difficulty, weakness and light-headedness.  Hematological: Negative for adenopathy. Does not bruise/bleed easily.  Psychiatric/Behavioral: Negative for behavioral problems and confusion.    Past Medical History:  Diagnosis Date  . ALLERGIC RHINITIS, SEASONAL 10/23/2009  . Anxiety 09/08/2016  . Basal cell carcinoma of neck 05/28/2011   r suprahyoid, radical neck dissection S/p 6 weeks of targeted radiation therapy   . Cancer ([place])   . CAP (community acquired pneumonia) 03/02/2016  . Costochondritis 09/16/2015  . Diabetes mellitus approx 2003   type 2  . DIABETES MELLITUS, TYPE II 10/23/2009  . DYSPNEA ON EXERTION 10/23/2009  . ELEVATED BLOOD PRESSURE 04/23/2010  . GERD (gastroesophageal reflux disease)   . Hearing loss   . History of radiation therapy 07/06/2011- 08/19/2011   34 Fractions to Right facial nodes through the right neck and right trigeminal nerve  . History of radiation therapy 05/23/14, 05/25/14, 05/28/14, 05/30/14, 06/01/14   SBRT Right upper lullng mass 54 Gy in 3 fractions, Left lower lung mass  and adjacent nodules 50 Gy in 5 fractions  . History of radiation therapy 02/11/2015- 02/20/15   Right lung superior upper lobe 50 Gy in 5 fractions, Left Lung posterior lower lobe 50 Gy in 5 fractions  . Hyperlipidemia   . Hypertension    Does not see a cardiologist, has not had a stress, echo   . Hypothyroid 01/28/2012  . Lung cancer ([place]) 03/08/14   invasive squamous cell carcinoma  . Mixed hyperlipidemia 10/23/2009  . Obesity   . OSA (obstructive sleep apnea)   . Otitis externa of right ear  07/06/2012  . Overweight(278.02) 10/23/2009  . Pedal edema 09/28/2016  . Preventative health care 09/30/2011  . Pulled muscle 07/06/12  . Right calf pain 09/08/2016  . S/P radiation therapy 07/06/11 - 08/19/11   Right Facial and Right Neck Nodes and right Trigeminal  Nerve to Base of Skull/ Total Dose 6600 cGy/ 33 Fractions  . Skin cancer    on nose, 9-10 yrs ago  . SLEEP APNEA, OBSTRUCTIVE 10/23/2009   [facility]  . SOB (shortness of breath) 09/16/2015  . Tinea pedis of both feet 03/02/2016  . Type II or unspecified type diabetes mellitus with unspecified complication, uncontrolled    type 2      Social History   Socioeconomic History  . Marital status: Married    Spouse name: Not on file  . Number of children: Not on file  . Years of education: Not on file  . Highest education level: Not on file  Occupational History  . Not on file  Tobacco Use  . Smoking status: Former Smoker    Packs/day: 1.00    Years: 9.00    Pack years: 9.00    Types: Cigarettes    Quit date: 03/31/1975    Years since quitting: 45.0  . Smokeless tobacco: Never Used  Substance and Sexual Activity  . Alcohol use: No    Alcohol/week: 0.0 standard drinks    Comment: 4 beers a month, 06/12/11 rarely uses now  . Drug use: No  . Sexual activity: Yes  Other Topics Concern  . Not on file  Social History Narrative   Patient is married.   Patient with a history of smoking one pack per day for approximately 29 years from ages of [age]. Patient denies ever having used smokeless tobacco. Patient with rare use of alcohol.      Mother died at age 53 from congestive heart failure complications. Father died at the age of 32 secondary to a stroke.   Social Determinants of Health   Financial Resource Strain: Not on file  Food Insecurity: Not on file  Transportation Needs: Not on file  Physical Activity: Not on file  Stress: Not on file  Social Connections: Not on file  Intimate Partner Violence: Not on  file    Past Surgical History:  Procedure Laterality Date  . LUNG BIOPSY Right 03/08/2014  . RADICAL NECK DISSECTION  05/28/2011   Procedure: RADICAL NECK DISSECTION;  Surgeon: Melissa Montane, MD;  Location: [facility];  Service: ENT;  Laterality: N/A;  Suprahyoid Neck Dissection  . skin cancer removal    . TONSILLECTOMY AND ADENOIDECTOMY      Family History  Problem Relation Age of Onset  . Other Mother        CHF  . Stroke Father   . Hyperlipidemia Father   . Heart disease Father        s/p valve replacement  .  Hyperlipidemia Brother   . Obesity Brother   . Other Brother        Back pain  . Hyperlipidemia Brother   . Hypertension Brother   . Other Brother        Panic attacks  . Hyperlipidemia Brother   . Anesthesia problems Neg Hx     No Known Allergies  Current Outpatient Medications on File Prior to Visit  Medication Sig Dispense Refill  . albuterol (VENTOLIN HFA) 108 (90 Base) MCG/ACT inhaler Inhale 1-2 puffs into the lungs every 4 (four) hours as needed for wheezing or shortness of breath. 25.5 g 3  . ALPRAZolam (XANAX) 0.25 MG tablet TAKE 1 TABLET(0.25 MG) BY MOUTH TWICE DAILY AS NEEDED FOR ANXIETY (Patient taking differently: Take 0.25 mg by mouth at bedtime as needed. ) 60 tablet 3  . Ascorbic Acid (VITAMIN C) 1000 MG tablet Take 1,000 mg by mouth daily.    . cetirizine (ZYRTEC) 10 MG tablet Take 10 mg by mouth daily as needed for allergies.     . chlorthalidone (HYGROTON) 25 MG tablet Take 1 tablet (25 mg total) by mouth daily as needed. 90 tablet 1  . colesevelam (WELCHOL) 625 MG tablet TAKE 3 TABLETS(1875 MG) BY MOUTH TWICE DAILY WITH A MEAL 120 tablet 0  . diclofenac Sodium (VOLTAREN) 1 % GEL Apply 2 g topically 2 (two) times daily.     Marland Kitchen docusate sodium (COLACE) 100 MG capsule Take 100 mg by mouth daily.    . famotidine (PEPCID) 40 MG tablet Take 1 tablet (40 mg total) by mouth daily. 90 tablet 0  . fenofibrate 160 MG tablet TAKE 1 TABLET(160 MG) BY MOUTH DAILY.   NEEDS OV 30 tablet 0  . fluticasone (FLONASE) 50 MCG/ACT nasal spray Place 2 sprays into both nostrils daily. 16 g 6  . ibuprofen (ADVIL,MOTRIN) 200 MG tablet Take 600 mg by mouth every 6 (six) hours as needed for pain.     Marland Kitchen insulin lispro (HUMALOG) 100 UNIT/ML injection INJECT 20 TO 24 UNITS INTO SKIN THREE TIMES DAILY WITH MEALS AS DIRECTED AS NEEDED (Patient taking differently: Inject 30 Units into the skin 3 (three) times daily with meals. ) 100 mL 3  . Krill Oil 300 MG CAPS Take 1 capsule by mouth daily.    Marland Kitchen LANTUS SOLOSTAR 100 UNIT/ML Solostar Pen INJECT 100 UNITS INTO THE SKIN IN MORNING AND INJECT 60 UNITS INTO THE SKIN EVERY EVENING. 135 mL 0  . levofloxacin (LEVAQUIN) 500 MG tablet Take 1 tablet (500 mg total) by mouth daily. 3 tablet 0  . levothyroxine (SYNTHROID) 100 MCG tablet TAKE 1 TABLET(100 MCG) BY MOUTH DAILY BEFORE BREAKFAST 90 tablet 2  . losartan (COZAAR) 50 MG tablet TAKE 1 TABLET(50 MG) BY MOUTH IN THE MORNING AND AT BEDTIME 180 tablet 0  . metFORMIN (GLUCOPHAGE) 500 MG tablet TAKE 2 TABLETS(1000 MG) BY MOUTH TWICE DAILY WITH A MEAL 360 tablet 0  . metoprolol succinate (TOPROL-XL) 100 MG 24 hr tablet TAKE 1 TABLET(100 MG) BY MOUTH DAILY WITH OR IMMEDIATELY FOLLOWING A MEAL 90 tablet 0  . Misc Natural Products (OSTEO BI-FLEX TRIPLE STRENGTH PO) Take 2 tablets by mouth daily.    . mometasone (ASMANEX, 60 METERED DOSES,) 220 MCG/INH inhaler Inhale 1 puff into the lungs 2 (two) times daily. (Patient taking differently: Inhale 1 puff into the lungs 2 (two) times daily as needed (shortness of breath). ) 1 Inhaler 5  . NON FORMULARY 1 each by Other route 4 (four)  times daily. Accu-check multiclix lancets- use as directed    . Probiotic Product (PROBIOTIC DAILY PO) Take by mouth.    . rosuvastatin (CRESTOR) 40 MG tablet Take 1 tablet (40 mg total) by mouth daily. NEEDS OV 90 tablet 0  . traMADol (ULTRAM) 50 MG tablet Take 1 tablet (50 mg total) by mouth every 6 (six) hours as needed  for severe pain. 20 tablet 0  . vismodegib (ERIVEDGE) 150 MG capsule Take by mouth.     No current facility-administered medications on file prior to visit.    BP 127/80   Pulse 95       Objective:   Physical Exam        Assessment & Plan:

## 2020-03-20 ENCOUNTER — Telehealth: Payer: Self-pay | Admitting: Family Medicine

## 2020-03-20 ENCOUNTER — Other Ambulatory Visit: Payer: Self-pay

## 2020-03-20 DIAGNOSIS — E1169 Type 2 diabetes mellitus with other specified complication: Secondary | ICD-10-CM

## 2020-03-20 DIAGNOSIS — E669 Obesity, unspecified: Secondary | ICD-10-CM

## 2020-03-20 LAB — LIPID PANEL
Cholesterol: 100 mg/dL (ref 0–200)
HDL: 28.1 mg/dL — ABNORMAL LOW (ref >39.00)
LDL Cholesterol: 32 mg/dL (ref 0–99)
NonHDL: 71.6
Total CHOL/HDL Ratio: 4
Triglycerides: 196 mg/dL — ABNORMAL HIGH (ref 0.0–149.0)
VLDL: 39.2 mg/dL (ref 0.0–40.0)

## 2020-03-20 LAB — CBC WITH DIFFERENTIAL/PLATELET
Basophils Absolute: 0.1 10*3/uL (ref 0.0–0.1)
Basophils Relative: 1.2 % (ref 0.0–3.0)
Eosinophils Absolute: 0.3 10*3/uL (ref 0.0–0.7)
Eosinophils Relative: 3.1 % (ref 0.0–5.0)
HCT: 43.5 % (ref 39.0–52.0)
Hemoglobin: 14.5 g/dL (ref 13.0–17.0)
Lymphocytes Relative: 13.7 % (ref 12.0–46.0)
Lymphs Abs: 1.2 10*3/uL (ref 0.7–4.0)
MCHC: 33.4 g/dL (ref 30.0–36.0)
MCV: 86.9 fl (ref 78.0–100.0)
Monocytes Absolute: 0.6 10*3/uL (ref 0.1–1.0)
Monocytes Relative: 6.7 % (ref 3.0–12.0)
Neutro Abs: 6.7 10*3/uL (ref 1.4–7.7)
Neutrophils Relative %: 75.3 % (ref 43.0–77.0)
Platelets: 304 10*3/uL (ref 150.0–400.0)
RBC: 5 Mil/uL (ref 4.22–5.81)
RDW: 14.4 % (ref 11.5–15.5)
WBC: 8.9 10*3/uL (ref 4.0–10.5)

## 2020-03-20 LAB — HEMOGLOBIN A1C: Hgb A1c MFr Bld: 8 % — ABNORMAL HIGH (ref 4.6–6.5)

## 2020-03-20 MED ORDER — ACCU-CHEK SOFTCLIX LANCETS MISC
12 refills | Status: DC
Start: 2020-03-20 — End: 2020-03-26

## 2020-03-20 NOTE — Telephone Encounter (Signed)
Medication:  Accu-check multiclix lancets also need test strip    Has the patient contacted their pharmacy? (If no, request that the patient contact the pharmacy for the refill.) (If yes, when and what did the pharmacy advise?)     Preferred Pharmacy (with phone number or street name):     [facility], [address] [place], [place] [address]  Phone:  [phone] Fax:  [phone]  Agent: Please be advised that RX refills may take up to 3 business days. We ask that you follow-up with your pharmacy.

## 2020-03-20 NOTE — Telephone Encounter (Signed)
Script sent in

## 2020-03-21 ENCOUNTER — Telehealth: Payer: Self-pay | Admitting: Medical

## 2020-03-21 DIAGNOSIS — I10 Essential (primary) hypertension: Secondary | ICD-10-CM

## 2020-03-21 NOTE — Telephone Encounter (Signed)
Future metabolic panel placed.

## 2020-03-26 ENCOUNTER — Other Ambulatory Visit: Payer: Self-pay

## 2020-03-26 DIAGNOSIS — E669 Obesity, unspecified: Secondary | ICD-10-CM

## 2020-03-26 DIAGNOSIS — E1169 Type 2 diabetes mellitus with other specified complication: Secondary | ICD-10-CM

## 2020-03-26 MED ORDER — ACCU-CHEK SOFTCLIX LANCETS MISC: 12 refills | Status: DC

## 2020-03-26 MED ORDER — LANTUS SOLOSTAR 100 UNIT/ML ~~LOC~~ SOPN: PEN_INJECTOR | SUBCUTANEOUS | 0 refills | Status: DC

## 2020-03-27 ENCOUNTER — Telehealth: Payer: Self-pay

## 2020-03-27 NOTE — Telephone Encounter (Signed)
PA initiated via Covermymeds; KEY: [id number]. Awaiting determination.

## 2020-04-01 NOTE — Telephone Encounter (Signed)
PA denied. Awaiting denial information.  °

## 2020-04-01 NOTE — Telephone Encounter (Signed)
Semi PA approval. Plan covers 165mL every 30 days.

## 2020-04-05 ENCOUNTER — Telehealth: Payer: Self-pay | Admitting: Family Medicine

## 2020-04-05 NOTE — Telephone Encounter (Signed)
Recommend plain mucinex twice a day. Claritin or Zyrtec 10 mg daily and Flonase 2 sprays each nostril daily. Warm tea with lemon and honey for the cough and I can call in Tessalon perles capsules and an Albuterol inhaler to use as needed if he would like.

## 2020-04-05 NOTE — Telephone Encounter (Signed)
Caller: Brynn  Call Back # [phone]  Patient calling in reference to coughing and nasal drip. Patient was tested for covid on Wednesday not results yet and would like to know what recommendation dr blyth  Had  For over the counter medications

## 2020-04-08 NOTE — Telephone Encounter (Signed)
Pt is aware instructions and states that he will try the over counter first. He will back in if he needs other medication sent in.

## 2020-04-21 ENCOUNTER — Other Ambulatory Visit: Payer: Self-pay | Admitting: Family Medicine

## 2020-04-22 ENCOUNTER — Other Ambulatory Visit: Payer: Self-pay | Admitting: Family Medicine

## 2020-04-24 ENCOUNTER — Ambulatory Visit: Payer: BC Managed Care – PPO | Admitting: Medical

## 2020-05-07 ENCOUNTER — Other Ambulatory Visit: Payer: Self-pay | Admitting: Family Medicine

## 2020-05-25 ENCOUNTER — Other Ambulatory Visit: Payer: Self-pay | Admitting: Family Medicine

## 2020-05-28 ENCOUNTER — Other Ambulatory Visit: Payer: Self-pay | Admitting: Family Medicine

## 2020-05-28 ENCOUNTER — Telehealth: Payer: Self-pay | Admitting: Family Medicine

## 2020-05-28 DIAGNOSIS — J189 Pneumonia, unspecified organism: Secondary | ICD-10-CM

## 2020-05-28 MED ORDER — BENZONATATE 100 MG PO CAPS
100.0000 mg | ORAL_CAPSULE | Freq: Three times a day (TID) | ORAL | 1 refills | Status: DC | PRN
Start: 2020-05-28 — End: 2021-09-03

## 2020-05-28 NOTE — Telephone Encounter (Signed)
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

## 2020-05-28 NOTE — Telephone Encounter (Signed)
Pt is requesting cough medication   Message text

## 2020-05-28 NOTE — Telephone Encounter (Signed)
Patient states he has had a cough for a few days now.Marland Kitchen He wanted to know if the cough meds he was prescribed in the past aval to be resent. He did know the name of the pill but states the nick name is Elie Goody. Appt was offiered but declined. Please advise if med can be refilled for him.

## 2020-05-29 NOTE — Telephone Encounter (Signed)
Pt is aware that medication is at pharmacy

## 2020-06-01 ENCOUNTER — Other Ambulatory Visit: Payer: Self-pay | Admitting: Family Medicine

## 2020-06-21 ENCOUNTER — Other Ambulatory Visit: Payer: Self-pay | Admitting: Family Medicine

## 2020-07-06 ENCOUNTER — Other Ambulatory Visit: Payer: Self-pay | Admitting: Family Medicine

## 2020-07-06 DIAGNOSIS — E1169 Type 2 diabetes mellitus with other specified complication: Secondary | ICD-10-CM

## 2020-07-23 ENCOUNTER — Other Ambulatory Visit: Payer: Self-pay | Admitting: Family Medicine

## 2020-07-24 ENCOUNTER — Telehealth: Payer: Self-pay

## 2020-07-24 NOTE — Telephone Encounter (Signed)
Sent mychart message

## 2020-07-24 NOTE — Telephone Encounter (Signed)
Sent a mychart message to schedule appointment

## 2020-08-15 DIAGNOSIS — C7951 Secondary malignant neoplasm of bone: Secondary | ICD-10-CM | POA: Insufficient documentation

## 2020-08-15 DIAGNOSIS — C782 Secondary malignant neoplasm of pleura: Secondary | ICD-10-CM | POA: Insufficient documentation

## 2020-08-23 ENCOUNTER — Other Ambulatory Visit: Payer: Self-pay | Admitting: Family Medicine

## 2020-09-17 ENCOUNTER — Other Ambulatory Visit: Payer: Self-pay | Admitting: Family Medicine

## 2020-10-01 ENCOUNTER — Other Ambulatory Visit: Payer: Self-pay | Admitting: Family Medicine

## 2020-11-05 ENCOUNTER — Other Ambulatory Visit: Payer: Self-pay | Admitting: Family Medicine

## 2020-11-25 ENCOUNTER — Other Ambulatory Visit: Payer: Self-pay | Admitting: Family Medicine

## 2020-11-29 ENCOUNTER — Telehealth: Payer: Self-pay | Admitting: Family Medicine

## 2020-11-29 NOTE — Telephone Encounter (Signed)
Patient was supposed to get 90 tablets for his metoprolol succinate (TOPROL-XL) 100 MG 24 hr tablet medication not 14. He would like for it to be fixed.  Please advice.

## 2020-12-11 ENCOUNTER — Other Ambulatory Visit: Payer: Self-pay

## 2020-12-11 ENCOUNTER — Ambulatory Visit (INDEPENDENT_AMBULATORY_CARE_PROVIDER_SITE_OTHER): Payer: 59 | Admitting: Medical

## 2020-12-11 ENCOUNTER — Encounter: Payer: Self-pay | Admitting: Medical

## 2020-12-11 VITALS — BP 118/70 | HR 78 | Resp 20 | Ht 75.0 in | Wt 275.0 lb

## 2020-12-11 DIAGNOSIS — I10 Essential (primary) hypertension: Secondary | ICD-10-CM | POA: Diagnosis not present

## 2020-12-11 DIAGNOSIS — E669 Obesity, unspecified: Secondary | ICD-10-CM | POA: Diagnosis not present

## 2020-12-11 DIAGNOSIS — E782 Mixed hyperlipidemia: Secondary | ICD-10-CM | POA: Diagnosis not present

## 2020-12-11 DIAGNOSIS — E1169 Type 2 diabetes mellitus with other specified complication: Secondary | ICD-10-CM

## 2020-12-11 LAB — HM DIABETES EYE EXAM

## 2020-12-11 MED ORDER — METOPROLOL SUCCINATE ER 100 MG PO TB24: ORAL_TABLET | ORAL | 0 refills | Status: DC

## 2020-12-11 NOTE — Patient Instructions (Addendum)
For htn continue metoprolol. Your blood pressure is well controlled. Very tight control with our bp machine. Earlier today at optometrist 124/74.  I want you to check your blood pressure every other day to make sure blood pressure not running too low since you do report some recent lower and reading.  Depending on your blood pressure readings might reduce her metoprolol to 50 mg daily.  Currently I refilling at the 100 mg daily dose.  For diabetes will get future a1c. Continue current med regimen. Make adjustments if needed.  For high cholesterol continue current statin. Get future lipid panel fasting.  Continue to follow up with oncologist.

## 2020-12-11 NOTE — Progress Notes (Signed)
Subjective:    Patient ID: Steven Ibarra, male    DOB: 26-Nov-1955, 65 y.o.   MRN: [id number]  HPI  Pt in for follow up.  Pt update me on treatment for lung cancer. Pt vismoegib 150 mg daily. Sees oncologist monthly.   Hx of htn. Pt is on metprolol. No longer on losartan.  Pt has high cholesterol. He is on crestor.  Has diabetes. Pt is on metformin 500 mg 2 tab po twice daily. Pt is still on lantus 70 units in am and 60 units at night. Pt does titrate up on night time units sometimes when sugar high in morning.     Review of Systems  Constitutional:  Negative for chills, fatigue and fever.  HENT:  Negative for congestion and drooling.   Respiratory:  Negative for cough, chest tightness, shortness of breath and wheezing.   Cardiovascular:  Negative for chest pain and palpitations.  Gastrointestinal:  Negative for abdominal pain, anal bleeding, blood in stool and constipation.  Genitourinary:  Negative for difficulty urinating, flank pain, frequency and hematuria.  Musculoskeletal:  Negative for back pain.  Skin:  Negative for rash.  Neurological:  Negative for dizziness, seizures, light-headedness and headaches.  Psychiatric/Behavioral:  Negative for behavioral problems, dysphoric mood and suicidal ideas. The patient is not nervous/anxious.     Past Medical History:  Diagnosis Date   ALLERGIC RHINITIS, SEASONAL 10/23/2009   Anxiety 09/08/2016   Basal cell carcinoma of neck 05/28/2011   r suprahyoid, radical neck dissection S/p 6 weeks of targeted radiation therapy    Cancer ([place])    CAP (community acquired pneumonia) 03/02/2016   Costochondritis 09/16/2015   Diabetes mellitus approx 2003   type 2   DIABETES MELLITUS, TYPE II 10/23/2009   DYSPNEA ON EXERTION 10/23/2009   ELEVATED BLOOD PRESSURE 04/23/2010   GERD (gastroesophageal reflux disease)    Hearing loss    History of radiation therapy 07/06/2011- 08/19/2011   33 Fractions to Right facial nodes through the right neck  and right trigeminal nerve   History of radiation therapy 05/23/14, 05/25/14, 05/28/14, 05/30/14, 06/01/14   SBRT Right upper lullng mass 54 Gy in 3 fractions, Left lower lung mass and adjacent nodules 50 Gy in 5 fractions   History of radiation therapy 02/11/2015- 02/20/15   Right lung superior upper lobe 50 Gy in 5 fractions, Left Lung posterior lower lobe 50 Gy in 5 fractions   Hyperlipidemia    Hypertension    Does not see a cardiologist, has not had a stress, echo    Hypothyroid 01/28/2012   Lung cancer ([place]) 03/08/14   invasive squamous cell carcinoma   Mixed hyperlipidemia 10/23/2009   Obesity    OSA (obstructive sleep apnea)    Otitis externa of right ear 07/06/2012   Overweight(278.02) 10/23/2009   Pedal edema 09/28/2016   Preventative health care 09/30/2011   Pulled muscle 07/06/12   Right calf pain 09/08/2016   S/P radiation therapy 07/06/11 - 08/19/11   Right Facial and Right Neck Nodes and right Trigeminal  Nerve to Base of Skull/ Total Dose 6600 cGy/ 33 Fractions   Skin cancer    on nose, 9-10 yrs ago   SLEEP APNEA, OBSTRUCTIVE 10/23/2009   Sleep study [facility] at [facility]   SOB (shortness of breath) 09/16/2015   Tinea pedis of both feet 03/02/2016   Type II or unspecified type diabetes mellitus with unspecified complication, uncontrolled    type 2      Social History  Socioeconomic History   Marital status: Married    Spouse name: Not on file   Number of children: Not on file   Years of education: Not on file   Highest education level: Not on file  Occupational History   Not on file  Tobacco Use   Smoking status: Former    Packs/day: 1.00    Years: 9.00    Pack years: 9.00    Types: Cigarettes    Quit date: 03/31/1975    Years since quitting: 45.7   Smokeless tobacco: Never  Substance and Sexual Activity   Alcohol use: No    Alcohol/week: 0.0 standard drinks    Comment: 4 beers a month, 06/12/11 rarely uses now   Drug use: No   Sexual activity: Yes  Other Topics Concern    Not on file  Social History Narrative   Patient is married.   Patient with a history of smoking one pack per day for approximately 29 years from ages of [age]. Patient denies ever having used smokeless tobacco. Patient with rare use of alcohol.      Mother died at age 14 from congestive heart failure complications. Father died at the age of 91 secondary to a stroke.   Social Determinants of Health   Financial Resource Strain: Not on file  Food Insecurity: Not on file  Transportation Needs: Not on file  Physical Activity: Not on file  Stress: Not on file  Social Connections: Not on file  Intimate Partner Violence: Not on file    Past Surgical History:  Procedure Laterality Date   LUNG BIOPSY Right 03/08/2014   RADICAL NECK DISSECTION  05/28/2011   Procedure: RADICAL NECK DISSECTION;  Surgeon: Melissa Montane, MD;  Location: [facility];  Service: ENT;  Laterality: N/A;  Suprahyoid Neck Dissection   skin cancer removal     TONSILLECTOMY AND ADENOIDECTOMY      Family History  Problem Relation Age of Onset   Other Mother        CHF   Stroke Father    Hyperlipidemia Father    Heart disease Father        s/p valve replacement   Hyperlipidemia Brother    Obesity Brother    Other Brother        Back pain   Hyperlipidemia Brother    Hypertension Brother    Other Brother        Panic attacks   Hyperlipidemia Brother    Anesthesia problems Neg Hx     No Known Allergies  Current Outpatient Medications on File Prior to Visit  Medication Sig Dispense Refill   Accu-Chek Softclix Lancets lancets USE AS DIRECTED 100 each 12   albuterol (VENTOLIN HFA) 108 (90 Base) MCG/ACT inhaler Inhale 1-2 puffs into the lungs every 4 (four) hours as needed for wheezing or shortness of breath. 25.5 g 3   ALPRAZolam (XANAX) 0.25 MG tablet TAKE 1 TABLET(0.25 MG) BY MOUTH TWICE DAILY AS NEEDED FOR ANXIETY 60 tablet 1   Ascorbic Acid (VITAMIN C) 1000 MG tablet Take 1,000 mg by mouth daily.     benzonatate  (TESSALON) 100 MG capsule Take 1 capsule (100 mg total) by mouth 3 (three) times daily as needed for cough. 40 capsule 1   cetirizine (ZYRTEC) 10 MG tablet Take 10 mg by mouth daily as needed for allergies.      colesevelam (WELCHOL) 625 MG tablet TAKE 3 TABLETS(1875 MG) BY MOUTH TWICE DAILY WITH A MEAL 540 tablet 0  CONTOUR NEXT TEST test strip USE TO TEST BLOOD SUGAR TWICE DAILY 100 strip 1   diclofenac Sodium (VOLTAREN) 1 % GEL APPLY 2 GRAMS(NICKEL SIZE AMOUNT) TOPICALLY TWICE DAILY AS NEEDED 100 g 1   docusate sodium (COLACE) 100 MG capsule Take 100 mg by mouth daily.     famotidine (PEPCID) 40 MG tablet Take 1 tablet (40 mg total) by mouth daily. 90 tablet 0   fenofibrate 160 MG tablet TAKE 1 TABLET(160 MG) BY MOUTH DAILY.  NEEDS OV 30 tablet 0   fluticasone (FLONASE) 50 MCG/ACT nasal spray Place 2 sprays into both nostrils daily. 16 g 6   ibuprofen (ADVIL,MOTRIN) 200 MG tablet Take 600 mg by mouth every 6 (six) hours as needed for pain.      insulin glargine (LANTUS SOLOSTAR) 100 UNIT/ML Solostar Pen INJECT 100 UNITS INTO THE SKIN IN THE MORNING, AND INJECT 60 UNITS INTO THE SKIN EVERY EVENING. 135 mL 0   insulin lispro (HUMALOG) 100 UNIT/ML injection INJECT 20 TO 24 UNITS INTO SKIN THREE TIMES DAILY WITH MEALS AS DIRECTED AS NEEDED (Patient taking differently: Inject 30 Units into the skin 3 (three) times daily with meals.) 100 mL 3   Krill Oil 300 MG CAPS Take 1 capsule by mouth daily.     levofloxacin (LEVAQUIN) 500 MG tablet Take 1 tablet (500 mg total) by mouth daily. 3 tablet 0   levothyroxine (SYNTHROID) 100 MCG tablet TAKE 1 TABLET(100 MCG) BY MOUTH DAILY BEFORE BREAKFAST 90 tablet 2   metFORMIN (GLUCOPHAGE) 500 MG tablet TAKE 2 TABLETS(1000 MG) BY MOUTH TWICE DAILY WITH A MEAL 360 tablet 0   Misc Natural Products (OSTEO BI-FLEX TRIPLE STRENGTH PO) Take 2 tablets by mouth daily.     mometasone (ASMANEX, 60 METERED DOSES,) 220 MCG/INH inhaler Inhale 1 puff into the lungs 2 (two) times  daily. (Patient taking differently: Inhale 1 puff into the lungs 2 (two) times daily as needed (shortness of breath).) 1 Inhaler 5   Probiotic Product (PROBIOTIC DAILY PO) Take by mouth.     rosuvastatin (CRESTOR) 40 MG tablet Take 1 tablet (40 mg total) by mouth daily. 90 tablet 0   traMADol (ULTRAM) 50 MG tablet Take 1 tablet (50 mg total) by mouth every 6 (six) hours as needed for severe pain. 20 tablet 0   vismodegib (ERIVEDGE) 150 MG capsule Take by mouth.     No current facility-administered medications on file prior to visit.    BP 118/70   Pulse 78   Resp 20   Ht 6\' 3"  (1.905 m)   Wt 275 lb (124.7 kg)   SpO2 99%   BMI 34.37 kg/m       Objective:   Physical Exam  General- No acute distress. Pleasant patient. Neck- Full range of motion, no jvd Lungs- Clear, even and unlabored. Heart- regular rate and rhythm. Neurologic- CNII- XII grossly intact.       Assessment & Plan:   Patient Instructions  For htn continue metoprolol. Your blood pressure is well controlled. Very tight control with our bp machine. Earlier today at optometrist 124/74.  I want you to check your blood pressure every other day to make sure blood pressure not running too low since you do report some recent lower and reading.  Depending on your blood pressure readings might reduce her metoprolol to 50 mg daily.  Currently I refilling at the 100 mg daily dose.  For diabetes will get future a1c. Continue current med regimen. Make adjustments if  needed.  For high cholesterol continue current statin. Get future lipid panel fasting.  Continue to follow up with oncologist.   Mackie Pai, PA-C

## 2020-12-13 ENCOUNTER — Other Ambulatory Visit: Payer: Self-pay

## 2020-12-13 ENCOUNTER — Other Ambulatory Visit (INDEPENDENT_AMBULATORY_CARE_PROVIDER_SITE_OTHER): Payer: 59

## 2020-12-13 DIAGNOSIS — I10 Essential (primary) hypertension: Secondary | ICD-10-CM | POA: Diagnosis not present

## 2020-12-13 DIAGNOSIS — E669 Obesity, unspecified: Secondary | ICD-10-CM

## 2020-12-13 DIAGNOSIS — E1169 Type 2 diabetes mellitus with other specified complication: Secondary | ICD-10-CM | POA: Diagnosis not present

## 2020-12-13 LAB — LDL CHOLESTEROL, DIRECT: Direct LDL: 43 mg/dL

## 2020-12-13 LAB — HEMOGLOBIN A1C: Hgb A1c MFr Bld: 7.9 % — ABNORMAL HIGH (ref 4.6–6.5)

## 2020-12-13 LAB — LIPID PANEL
Cholesterol: 112 mg/dL (ref 0–200)
HDL: 31.1 mg/dL — ABNORMAL LOW (ref >39.00)
NonHDL: 80.96
Total CHOL/HDL Ratio: 4
Triglycerides: 249 mg/dL — ABNORMAL HIGH (ref 0.0–149.0)
VLDL: 49.8 mg/dL — ABNORMAL HIGH (ref 0.0–40.0)

## 2020-12-13 LAB — COMPREHENSIVE METABOLIC PANEL
ALT: 13 U/L (ref 0–53)
AST: 12 U/L (ref 0–37)
Albumin: 3.8 g/dL (ref 3.5–5.2)
Alkaline Phosphatase: 52 U/L (ref 39–117)
BUN: 12 mg/dL (ref 6–23)
CO2: 32 mEq/L (ref 19–32)
Calcium: 9 mg/dL (ref 8.4–10.5)
Chloride: 102 mEq/L (ref 96–112)
Creatinine, Ser: 0.95 mg/dL (ref 0.40–1.50)
GFR: 84.8 mL/min (ref >60.00)
Glucose, Bld: 117 mg/dL — ABNORMAL HIGH (ref 70–99)
Potassium: 4.4 mEq/L (ref 3.5–5.1)
Sodium: 140 mEq/L (ref 135–145)
Total Bilirubin: 0.6 mg/dL (ref 0.2–1.2)
Total Protein: 5.9 g/dL — ABNORMAL LOW (ref 6.0–8.3)

## 2020-12-16 ENCOUNTER — Other Ambulatory Visit: Payer: Self-pay | Admitting: Family Medicine

## 2020-12-18 ENCOUNTER — Encounter: Payer: Self-pay | Admitting: *Deleted

## 2020-12-20 ENCOUNTER — Other Ambulatory Visit: Payer: Self-pay | Admitting: Family Medicine

## 2020-12-25 ENCOUNTER — Telehealth: Payer: Self-pay | Admitting: Family Medicine

## 2020-12-25 ENCOUNTER — Other Ambulatory Visit: Payer: Self-pay

## 2020-12-25 NOTE — Telephone Encounter (Signed)
Patient called regarding new prescription request. [facility] is no longer covering meds insulin glargine (LANTUS SOLOSTAR) 100 UNIT/ML Solostar Pen patient will need generic brand. Please advise.   [id number] - [place], [address] [place], [place] [address]  Phone:  [phone]  Fax:  [phone]

## 2020-12-26 ENCOUNTER — Other Ambulatory Visit: Payer: Self-pay | Admitting: Family Medicine

## 2020-12-26 ENCOUNTER — Other Ambulatory Visit: Payer: Self-pay

## 2020-12-26 MED ORDER — INSULIN GLARGINE-YFGN 100 UNIT/ML ~~LOC~~ SOLN: SUBCUTANEOUS | 5 refills | Status: DC

## 2020-12-27 NOTE — Telephone Encounter (Signed)
Left detailed message on machine that rx has been changed.

## 2020-12-30 ENCOUNTER — Telehealth: Payer: Self-pay | Admitting: Family Medicine

## 2020-12-30 ENCOUNTER — Other Ambulatory Visit: Payer: Self-pay

## 2020-12-30 DIAGNOSIS — E1169 Type 2 diabetes mellitus with other specified complication: Secondary | ICD-10-CM

## 2020-12-30 MED ORDER — INSULIN GLARGINE-YFGN 100 UNIT/ML ~~LOC~~ SOPN: PEN_INJECTOR | SUBCUTANEOUS | 5 refills | Status: DC

## 2020-12-30 NOTE — Telephone Encounter (Signed)
Patient was informed by pharmacy that a pen for his insulin was now available. Patient wants to have pen instead of medication syringe for insulin glargine-yfgn (SEMGLEE, YFGN,) 100 UNIT/ML injection. Please advise.

## 2020-12-30 NOTE — Telephone Encounter (Signed)
Medication resent

## 2021-01-03 ENCOUNTER — Other Ambulatory Visit: Payer: Self-pay | Admitting: Family Medicine

## 2021-01-08 ENCOUNTER — Other Ambulatory Visit: Payer: Self-pay | Admitting: *Deleted

## 2021-01-08 DIAGNOSIS — E1169 Type 2 diabetes mellitus with other specified complication: Secondary | ICD-10-CM

## 2021-01-08 DIAGNOSIS — E669 Obesity, unspecified: Secondary | ICD-10-CM

## 2021-01-08 MED ORDER — PEN NEEDLES 32G X 4 MM MISC: 1 refills | Status: DC

## 2021-01-08 MED ORDER — INSULIN GLARGINE-YFGN 100 UNIT/ML ~~LOC~~ SOPN
PEN_INJECTOR | SUBCUTANEOUS | 5 refills | Status: DC
Start: 2021-01-08 — End: 2021-05-28

## 2021-01-13 ENCOUNTER — Telehealth: Payer: Self-pay | Admitting: Family Medicine

## 2021-01-13 MED ORDER — METOPROLOL SUCCINATE ER 100 MG PO TB24: ORAL_TABLET | ORAL | 0 refills | Status: DC

## 2021-01-13 NOTE — Telephone Encounter (Signed)
Rx sent 

## 2021-01-13 NOTE — Telephone Encounter (Signed)
Sorry meant to route request to [employer]

## 2021-01-13 NOTE — Telephone Encounter (Signed)
Medication: metoprolol succinate (TOPROL-XL) 100 MG 24 hr tablet   Has the patient contacted their pharmacy? No. (If no, request that the patient contact the pharmacy for the refill.) (If yes, when and what did the pharmacy advise?)  Preferred Pharmacy (with phone number or street name): [facility], [address] [place], [place] [address]  Phone:  [phone]    Agent: Please be advised that RX refills may take up to 3 business days. We ask that you follow-up with your pharmacy.

## 2021-02-14 ENCOUNTER — Telehealth: Payer: Self-pay | Admitting: Family Medicine

## 2021-02-14 ENCOUNTER — Other Ambulatory Visit: Payer: Self-pay

## 2021-02-14 DIAGNOSIS — L821 Other seborrheic keratosis: Secondary | ICD-10-CM | POA: Insufficient documentation

## 2021-02-14 MED ORDER — METOPROLOL SUCCINATE ER 100 MG PO TB24
ORAL_TABLET | ORAL | 4 refills | Status: DC
Start: 2021-02-14 — End: 2021-02-17

## 2021-02-14 NOTE — Telephone Encounter (Signed)
Medication sent.

## 2021-02-14 NOTE — Telephone Encounter (Signed)
Medication: metoprolol succinate (TOPROL-XL) 100 MG 24 hr tablet  Has the patient contacted their pharmacy? No. (If no, request that the patient contact the pharmacy for the refill.) (If yes, when and what did the pharmacy advise?)  Preferred Pharmacy (with phone number or street name): [facility], [address]. [place], [place] [address]  Phone:  [phone]  Fax:  [phone]  Agent: Please be advised that RX refills may take up to 3 business days. We ask that you follow-up with your pharmacy.

## 2021-02-17 ENCOUNTER — Other Ambulatory Visit: Payer: Self-pay | Admitting: Family Medicine

## 2021-03-22 ENCOUNTER — Other Ambulatory Visit: Payer: Self-pay | Admitting: Family Medicine

## 2021-04-08 ENCOUNTER — Ambulatory Visit
Admission: RE | Admit: 2021-04-08 | Discharge: 2021-04-08 | Disposition: A | Discharge status: Home / Self Care | Payer: Self-pay | Source: Ambulatory Visit | Attending: Radiation Oncology | Admitting: Radiation Oncology

## 2021-04-08 ENCOUNTER — Other Ambulatory Visit: Payer: Self-pay

## 2021-04-08 DIAGNOSIS — C7802 Secondary malignant neoplasm of left lung: Secondary | ICD-10-CM

## 2021-04-18 ENCOUNTER — Encounter: Payer: Self-pay | Admitting: Radiation Oncology

## 2021-04-18 NOTE — Progress Notes (Signed)
Steven Ibarra Oct 07, 1955 [id number]   Upon speaking to Dr. Baltazar Ibarra earlier this month, I obtained access to his recent [facility] imaging showing left rib fractures that had been concerning for metastatic disease.  I compared those images to his SBRT lung treatment plan from March 2016 - (Left lower lung mass and adjacent nodules / 50 Gy in 5 fractions treatment w/ 3D conformal with DCAs / 6MV FFF). See snapshot below from SBRT plan.   The current rib fractures mimic where the previous isodose lines overlapped his chest wall. These fractures are almost certainly secondary to radiation effects, not metastatic disease.     I left a VM for Dr. Baltazar Ibarra with this information and will route this note to him.  -----------------------------------  [name], MD

## 2021-05-23 DIAGNOSIS — R7401 Elevation of levels of liver transaminase levels: Secondary | ICD-10-CM | POA: Insufficient documentation

## 2021-05-26 ENCOUNTER — Telehealth: Payer: Self-pay | Admitting: Family Medicine

## 2021-05-26 ENCOUNTER — Other Ambulatory Visit: Payer: Self-pay | Admitting: Family Medicine

## 2021-05-26 DIAGNOSIS — E1169 Type 2 diabetes mellitus with other specified complication: Secondary | ICD-10-CM

## 2021-05-26 MED ORDER — COLESEVELAM HCL 625 MG PO TABS: ORAL_TABLET | ORAL | 0 refills | Status: DC

## 2021-05-26 NOTE — Telephone Encounter (Signed)
Medication: colesevelam (WELCHOL) 625 MG tablet  Has the patient contacted their pharmacy? Yes.    Preferred Pharmacy (with phone number or street name):  [facility] [place], [address]. [place], [place] [address]  Phone:  [phone]  Fax:  [phone]   Agent: Please be advised that RX refills may take up to 3 business days. We ask that you follow-up with your pharmacy.

## 2021-05-28 ENCOUNTER — Telehealth: Payer: Self-pay | Admitting: Family Medicine

## 2021-05-28 ENCOUNTER — Other Ambulatory Visit: Payer: Self-pay | Admitting: Family Medicine

## 2021-05-28 ENCOUNTER — Other Ambulatory Visit: Payer: Self-pay

## 2021-05-28 DIAGNOSIS — E1169 Type 2 diabetes mellitus with other specified complication: Secondary | ICD-10-CM

## 2021-05-28 DIAGNOSIS — E669 Obesity, unspecified: Secondary | ICD-10-CM

## 2021-05-28 MED ORDER — INSULIN GLARGINE-YFGN 100 UNIT/ML ~~LOC~~ SOPN: PEN_INJECTOR | SUBCUTANEOUS | 5 refills | Status: DC

## 2021-05-28 NOTE — Telephone Encounter (Signed)
[facility] ?

## 2021-05-28 NOTE — Telephone Encounter (Signed)
Medication: insulin glargine-yfgn (SEMGLEE) 100 UNIT/ML Pen ? ?Has the patient contacted their pharmacy? Yes.   ? ? ?Preferred Pharmacy: [facility] ? ? ?

## 2021-06-13 DIAGNOSIS — Z860100 Personal history of colon polyps, unspecified: Secondary | ICD-10-CM | POA: Insufficient documentation

## 2021-06-13 DIAGNOSIS — K5790 Diverticulosis of intestine, part unspecified, without perforation or abscess without bleeding: Secondary | ICD-10-CM | POA: Insufficient documentation

## 2021-06-23 ENCOUNTER — Inpatient Hospital Stay: Payer: Self-pay | Admitting: Family Medicine

## 2021-07-16 ENCOUNTER — Other Ambulatory Visit: Payer: Self-pay | Admitting: Family Medicine

## 2021-08-01 ENCOUNTER — Other Ambulatory Visit: Payer: Self-pay | Admitting: Family Medicine

## 2021-08-01 DIAGNOSIS — E1169 Type 2 diabetes mellitus with other specified complication: Secondary | ICD-10-CM

## 2021-08-05 ENCOUNTER — Other Ambulatory Visit: Payer: Self-pay | Admitting: Family Medicine

## 2021-09-03 ENCOUNTER — Ambulatory Visit: Payer: BLUE CROSS/BLUE SHIELD | Admitting: Family

## 2021-09-03 VITALS — BP 137/83 | HR 66 | Temp 97.7°F | Resp 16 | Ht 77.0 in | Wt 265.0 lb

## 2021-09-03 DIAGNOSIS — E1165 Type 2 diabetes mellitus with hyperglycemia: Secondary | ICD-10-CM

## 2021-09-03 DIAGNOSIS — E669 Obesity, unspecified: Secondary | ICD-10-CM

## 2021-09-03 DIAGNOSIS — E1169 Type 2 diabetes mellitus with other specified complication: Secondary | ICD-10-CM | POA: Diagnosis not present

## 2021-09-03 DIAGNOSIS — Z23 Encounter for immunization: Secondary | ICD-10-CM

## 2021-09-03 DIAGNOSIS — E039 Hypothyroidism, unspecified: Secondary | ICD-10-CM

## 2021-09-03 DIAGNOSIS — E782 Mixed hyperlipidemia: Secondary | ICD-10-CM | POA: Diagnosis not present

## 2021-09-03 DIAGNOSIS — R739 Hyperglycemia, unspecified: Secondary | ICD-10-CM

## 2021-09-03 DIAGNOSIS — I10 Essential (primary) hypertension: Secondary | ICD-10-CM

## 2021-09-03 LAB — MICROALBUMIN / CREATININE URINE RATIO
Creatinine,U: 72.6 mg/dL
Microalb Creat Ratio: 1 mg/g (ref 0.0–30.0)
Microalb, Ur: <0.7 mg/dL (ref 0.0–1.9)

## 2021-09-03 LAB — HEMOGLOBIN A1C: Hgb A1c MFr Bld: 7.3 % — ABNORMAL HIGH (ref 4.6–6.5)

## 2021-09-03 MED ORDER — METOPROLOL SUCCINATE ER 100 MG PO TB24: ORAL_TABLET | ORAL | 1 refills | Status: DC

## 2021-09-03 MED ORDER — INSULIN GLARGINE-YFGN 100 UNIT/ML ~~LOC~~ SOPN: 50.0000 [IU] | PEN_INJECTOR | Freq: Two times a day (BID) | SUBCUTANEOUS | 1 refills | Status: DC

## 2021-09-03 MED ORDER — LEVOTHYROXINE SODIUM 112 MCG PO TABS: 112.0000 ug | ORAL_TABLET | Freq: Every day | ORAL | 1 refills | Status: DC

## 2021-09-03 NOTE — Patient Instructions (Signed)
Please increase synthroid to 112 mcg once daily. Restart rosuvastatin. Complete lab work prior to leaving.

## 2021-09-03 NOTE — Progress Notes (Signed)
Subjective:     Patient ID: Steven Ibarra, male    DOB: [birth year], 66 y.o.   MRN: [id number]  Chief Complaint  Patient presents with   Diabetes    Here for follow up   Hypertension    Here for follow up   Hypothyroidism    Here for follow up    HPI Patient is in today for follow up.  He is currently being followed at [place] for Metastatic Carcinoma by Dr. Baltazar Najjar.  Had a follow up visit 2 weeks ago. He is maintained on vismodegib treatment.   HTN- Not currently maintained on antihypertensive.  BP Readings from Last 3 Encounters:  09/03/21 137/83  12/11/20 118/70  03/19/20 127/80   DM2- treatment includes Semglee 100 units sq in the AM and 60 units sq in the pm. States that he has only been taking 50 units bid. Reports well controlled. Fasting this AM was 110. He reports that he is taking 50 in the AM and 50 in the pm since he has lost a lot of weight. Reports that he has lost about 80 pounds.    Lab Results  Component Value Date   HGBA1C 7.3 (H) 09/03/2021   HGBA1C 7.9 (H) 12/13/2020   HGBA1C 8.0 (H) 03/19/2020   Lab Results  Component Value Date   MICROALBUR <0.7 09/03/2021   LDLCALC 32 03/19/2020   CREATININE 0.95 12/13/2020   Hyperlipidemia- maintained on welchol and rosuvastatin.   Hypothyroid- maintained on synthroid 112 mcg.  Lab Results  Component Value Date   TSH 2.26 12/13/2017   Maintained on synthroid.    Pain management he uses tramadol 50mg  q6 hr prn pain.   Health Maintenance Due  Topic Date Due   COVID-19 Vaccine (1) Never done   Zoster Vaccines- Shingrix (1 of 2) Never done   FOOT EXAM  07/16/2018    Past Medical History:  Diagnosis Date   ALLERGIC RHINITIS, SEASONAL 10/23/2009   Anxiety 09/08/2016   Basal cell carcinoma of neck 05/28/2011   r suprahyoid, radical neck dissection S/p 6 weeks of targeted radiation therapy    Cancer ([place])    CAP (community acquired pneumonia) 03/02/2016   Costochondritis 09/16/2015   Diabetes mellitus  approx 2003   type 2   DIABETES MELLITUS, TYPE II 10/23/2009   DYSPNEA ON EXERTION 10/23/2009   ELEVATED BLOOD PRESSURE 04/23/2010   GERD (gastroesophageal reflux disease)    Hearing loss    History of radiation therapy 07/06/2011- 08/19/2011   33 Fractions to Right facial nodes through the right neck and right trigeminal nerve   History of radiation therapy 05/23/14, 05/25/14, 05/28/14, 05/30/14, 06/01/14   SBRT Right upper lullng mass 54 Gy in 3 fractions, Left lower lung mass and adjacent nodules 50 Gy in 5 fractions   History of radiation therapy 02/11/2015- 02/20/15   Right lung superior upper lobe 50 Gy in 5 fractions, Left Lung posterior lower lobe 50 Gy in 5 fractions   Hyperlipidemia    Hypertension    Does not see a cardiologist, has not had a stress, echo    Hypothyroid 01/28/2012   Lung cancer ([place]) 03/08/14   invasive squamous cell carcinoma   Mixed hyperlipidemia 10/23/2009   Obesity    OSA (obstructive sleep apnea)    Otitis externa of right ear 07/06/2012   Overweight(278.02) 10/23/2009   Pedal edema 09/28/2016   Preventative health care 09/30/2011   Pulled muscle 07/06/12   Right calf pain 09/08/2016   S/P  radiation therapy 07/06/11 - 08/19/11   Right Facial and Right Neck Nodes and right Trigeminal  Nerve to Base of Skull/ Total Dose 6600 cGy/ 33 Fractions   SIRS (systemic inflammatory response syndrome) ([place]) 04/24/2019   Skin cancer    on nose, 9-10 yrs ago   SLEEP APNEA, OBSTRUCTIVE 10/23/2009   Sleep study Done at [facility]   SOB (shortness of breath) 09/16/2015   Tinea pedis of both feet 03/02/2016   Type II or unspecified type diabetes mellitus with unspecified complication, uncontrolled    type 2     Past Surgical History:  Procedure Laterality Date   LUNG BIOPSY Right 03/08/2014   RADICAL NECK DISSECTION  05/28/2011   Procedure: RADICAL NECK DISSECTION;  Surgeon: Maly Lemarr Montane, MD;  Location: [facility] OR;  Service: ENT;  Laterality: N/A;  Suprahyoid Neck Dissection   skin cancer  removal     TONSILLECTOMY AND ADENOIDECTOMY      Family History  Problem Relation Age of Onset   Other Mother        CHF   Stroke Father    Hyperlipidemia Father    Heart disease Father        s/p valve replacement   Hyperlipidemia Brother    Obesity Brother    Other Brother        Back pain   Hyperlipidemia Brother    Hypertension Brother    Other Brother        Panic attacks   Hyperlipidemia Brother    Anesthesia problems Neg Hx     Social History   Socioeconomic History   Marital status: Married    Spouse name: Not on file   Number of children: Not on file   Years of education: Not on file   Highest education level: Not on file  Occupational History   Not on file  Tobacco Use   Smoking status: Former    Packs/day: 1.00    Years: 9.00    Total pack years: 9.00    Types: Cigarettes    Quit date: 03/31/1975    Years since quitting: 46.4   Smokeless tobacco: Never  Substance and Sexual Activity   Alcohol use: No    Alcohol/week: 0.0 standard drinks of alcohol    Comment: 4 beers a month, 06/12/11 rarely uses now   Drug use: No   Sexual activity: Yes  Other Topics Concern   Not on file  Social History Narrative   Patient is married.   Patient with a history of smoking one pack per day for approximately 29 years from ages of [age]. Patient denies ever having used smokeless tobacco. Patient with rare use of alcohol.      Mother died at age 33 from congestive heart failure complications. Father died at the age of 82 secondary to a stroke.   Social Determinants of Health   Financial Resource Strain: Not on file  Food Insecurity: Not on file  Transportation Needs: Not on file  Physical Activity: Not on file  Stress: Not on file  Social Connections: Not on file  Intimate Partner Violence: Not on file    Outpatient Medications Prior to Visit  Medication Sig Dispense Refill   Accu-Chek Softclix Lancets lancets USE TO CHECK BLOOD SUGAR TWICE DAILY 100 each 12    albuterol (VENTOLIN HFA) 108 (90 Base) MCG/ACT inhaler Inhale 1-2 puffs into the lungs every 4 (four) hours as needed for wheezing or shortness of breath. 25.5 g 3   ALPRAZolam (  XANAX) 0.25 MG tablet TAKE 1 TABLET(0.25 MG) BY MOUTH TWICE DAILY AS NEEDED FOR ANXIETY 60 tablet 1   Ascorbic Acid (VITAMIN C) 1000 MG tablet Take 1,000 mg by mouth daily.     cetirizine (ZYRTEC) 10 MG tablet Take 10 mg by mouth daily as needed for allergies.      colesevelam (WELCHOL) 625 MG tablet TAKE 3 TABLETS(1875 MG) BY MOUTH TWICE DAILY WITH A MEAL 540 tablet 0   diclofenac Sodium (VOLTAREN) 1 % GEL APPLY 2 GRAMS(NICKEL SIZE AMOUNT) TOPICALLY TWICE DAILY AS NEEDED 100 g 1   docusate sodium (COLACE) 100 MG capsule Take 100 mg by mouth daily.     famotidine (PEPCID) 40 MG tablet Take 1 tablet (40 mg total) by mouth daily. 90 tablet 0   fenofibrate 160 MG tablet TAKE 1 TABLET(160 MG) BY MOUTH DAILY.  NEEDS OV 30 tablet 0   fluticasone (FLONASE) 50 MCG/ACT nasal spray Place 2 sprays into both nostrils daily. 16 g 6   glucose blood (CONTOUR NEXT TEST) test strip USE TO CHECK BLOOD SUGAR TWICE DAILY 100 strip 12   ibuprofen (ADVIL,MOTRIN) 200 MG tablet Take 600 mg by mouth every 6 (six) hours as needed for pain.      insulin lispro (HUMALOG) 100 UNIT/ML injection INJECT 20 TO 24 UNITS INTO SKIN THREE TIMES DAILY WITH MEALS AS DIRECTED AS NEEDED (Patient taking differently: Inject 30 Units into the skin 3 (three) times daily with meals.) 100 mL 3   Insulin Pen Needle (PEN NEEDLES) 32G X 4 MM MISC Use with Semglee pen twice a day 200 each 1   Krill Oil 300 MG CAPS Take 1 capsule by mouth daily.     metFORMIN (GLUCOPHAGE) 500 MG tablet TAKE 2 TABLETS(1000 MG) BY MOUTH TWICE DAILY WITH A MEAL 360 tablet 1   Misc Natural Products (OSTEO BI-FLEX TRIPLE STRENGTH PO) Take 2 tablets by mouth daily.     mometasone (ASMANEX, 60 METERED DOSES,) 220 MCG/INH inhaler Inhale 1 puff into the lungs 2 (two) times daily. (Patient taking  differently: Inhale 1 puff into the lungs 2 (two) times daily as needed (shortness of breath).) 1 Inhaler 5   Probiotic Product (PROBIOTIC DAILY PO) Take by mouth.     traMADol (ULTRAM) 50 MG tablet Take 1 tablet (50 mg total) by mouth every 6 (six) hours as needed for severe pain. 20 tablet 0   vismodegib (ERIVEDGE) 150 MG capsule Take by mouth.     benzonatate (TESSALON) 100 MG capsule Take 1 capsule (100 mg total) by mouth 3 (three) times daily as needed for cough. 40 capsule 1   insulin glargine-yfgn (SEMGLEE) 100 UNIT/ML Pen 100 units in the morning and 60 units in the evening 60 mL 5   levofloxacin (LEVAQUIN) 500 MG tablet Take 1 tablet (500 mg total) by mouth daily. 3 tablet 0   levothyroxine (SYNTHROID) 100 MCG tablet TAKE 1 TABLET(100 MCG) BY MOUTH DAILY BEFORE AND BREAKFAST 30 tablet 0   metoprolol succinate (TOPROL-XL) 100 MG 24 hr tablet TAKE 1 TABLET(100 MG) BY MOUTH DAILY WITH OR IMMEDIATELY FOLLOWING A MEAL 30 tablet 4   rosuvastatin (CRESTOR) 40 MG tablet TAKE 1 TABLET(40 MG) BY MOUTH DAILY (Patient not taking: Reported on 09/03/2021) 90 tablet 1   No facility-administered medications prior to visit.    No Known Allergies  ROS See HPI    Objective:    Physical Exam Constitutional:      General: He is not in acute distress.  Appearance: He is well-developed.  HENT:     Head: Normocephalic and atraumatic.  Cardiovascular:     Rate and Rhythm: Normal rate and regular rhythm.     Heart sounds: No murmur heard. Pulmonary:     Effort: Pulmonary effort is normal. No respiratory distress.     Breath sounds: Normal breath sounds. No wheezing or rales.  Skin:    General: Skin is warm and dry.  Neurological:     Mental Status: He is alert and oriented to person, place, and time.  Psychiatric:        Behavior: Behavior normal.        Thought Content: Thought content normal.     BP 137/83 (BP Location: Right Arm, Patient Position: Sitting, Cuff Size: Large)   Pulse 66    Temp 97.7 F (36.5 C) (Oral)   Resp 16   Ht 6\' 5"  (1.956 m)   Wt 265 lb (120.2 kg)   SpO2 96%   BMI 31.42 kg/m  Wt Readings from Last 3 Encounters:  09/03/21 265 lb (120.2 kg)  12/11/20 275 lb (124.7 kg)  04/25/19 (!) 322 lb 1.5 oz (146.1 kg)       Assessment & Plan:   Problem List Items Addressed This Visit       Unprioritized   MIXED HYPERLIPIDEMIA    Lab Results  Component Value Date   CHOL 112 12/13/2020   HDL 31.10 (L) 12/13/2020   LDLCALC 32 03/19/2020   LDLDIRECT 43.0 12/13/2020   TRIG 249.0 (H) 12/13/2020   CHOLHDL 4 12/13/2020  LDL at goal on welchol and rosuvastatin. Continue same.       Relevant Medications   metoprolol succinate (TOPROL-XL) 100 MG 24 hr tablet   Other Relevant Orders   Lipid panel   Hypothyroid    He will return to lab for TSH which was not included in today's labs. Continue synthroid.       Relevant Medications   metoprolol succinate (TOPROL-XL) 100 MG 24 hr tablet   levothyroxine (SYNTHROID) 112 MCG tablet   Other Relevant Orders   TSH   Hypertension    BP stable with antihypertensive. Monitor.        Relevant Medications   metoprolol succinate (TOPROL-XL) 100 MG 24 hr tablet   Diabetes mellitus type 2 in obese [facility])    Lab Results  Component Value Date   HGBA1C 7.3 (H) 09/03/2021   HGBA1C 7.9 (H) 12/13/2020   HGBA1C 8.0 (H) 03/19/2020   Lab Results  Component Value Date   MICROALBUR <0.7 09/03/2021   LDLCALC 32 03/19/2020   CREATININE 0.95 12/13/2020  A1C has improved. No changes to current regimen.       Relevant Medications   insulin glargine-yfgn (SEMGLEE) 100 UNIT/ML Pen   Other Relevant Orders   Urine Microalbumin w/creat. ratio (Completed)   Other Visit Diagnoses     Hyperglycemia    -  Primary   Relevant Orders   Hemoglobin A1c (Completed)   Need for Td vaccine       Relevant Orders   Td : Tetanus/diphtheria >7yo Preservative  free (Completed)      30 minutes spent on today's visit today. Time  was spent reviewing medical record and interviewing patient.   I have discontinued Juanda Crumble E. Friddle's levofloxacin, benzonatate, and levothyroxine. I have also changed his metoprolol succinate and insulin glargine-yfgn. Additionally, I am having him start on levothyroxine. Lastly, I am having him maintain his cetirizine, ibuprofen, Misc Natural Products (  OSTEO BI-FLEX TRIPLE STRENGTH PO), vitamin C, Krill Oil, Probiotic Product (PROBIOTIC DAILY PO), traMADol, fluticasone, mometasone, insulin lispro, albuterol, vismodegib, docusate sodium, famotidine, fenofibrate, ALPRAZolam, diclofenac Sodium, Contour Next Test, Pen Needles, rosuvastatin, colesevelam, metFORMIN, and Accu-Chek Softclix Lancets.  Meds ordered this encounter  Medications   metoprolol succinate (TOPROL-XL) 100 MG 24 hr tablet    Sig: Take with or immediately following a meal.    Dispense:  90 tablet    Refill:  1   insulin glargine-yfgn (SEMGLEE) 100 UNIT/ML Pen    Sig: Inject 50 Units into the skin 2 (two) times daily. 100 units in the morning and 60 units in the evening    Dispense:  90 mL    Refill:  1    Order Specific Question:   Supervising Provider    Answer:   Penni Homans A [[id number]]   levothyroxine (SYNTHROID) 112 MCG tablet    Sig: Take 1 tablet (112 mcg total) by mouth daily before breakfast.    Dispense:  90 tablet    Refill:  1    Order Specific Question:   Supervising Provider    Answer:   Penni Homans A [[id number]]

## 2021-09-08 ENCOUNTER — Encounter: Payer: Self-pay | Admitting: Family

## 2021-09-08 NOTE — Assessment & Plan Note (Signed)
He will return to lab for TSH which was not included in today's labs. Continue synthroid.

## 2021-09-08 NOTE — Assessment & Plan Note (Signed)
Lab Results  Component Value Date   HGBA1C 7.3 (H) 09/03/2021   HGBA1C 7.9 (H) 12/13/2020   HGBA1C 8.0 (H) 03/19/2020   Lab Results  Component Value Date   MICROALBUR <0.7 09/03/2021   LDLCALC 32 03/19/2020   CREATININE 0.95 12/13/2020   A1C has improved. No changes to current regimen.

## 2021-09-08 NOTE — Assessment & Plan Note (Signed)
BP stable with antihypertensive. Monitor.

## 2021-09-08 NOTE — Assessment & Plan Note (Signed)
Lab Results  Component Value Date   CHOL 112 12/13/2020   HDL 31.10 (L) 12/13/2020   LDLCALC 32 03/19/2020   LDLDIRECT 43.0 12/13/2020   TRIG 249.0 (H) 12/13/2020   CHOLHDL 4 12/13/2020   LDL at goal on welchol and rosuvastatin. Continue same.

## 2021-09-22 ENCOUNTER — Other Ambulatory Visit: Payer: Self-pay | Admitting: Family Medicine

## 2021-09-22 DIAGNOSIS — F419 Anxiety disorder, unspecified: Secondary | ICD-10-CM

## 2021-09-22 MED ORDER — ALPRAZOLAM 0.25 MG PO TABS: ORAL_TABLET | ORAL | 1 refills | Status: AC

## 2021-09-22 NOTE — Telephone Encounter (Signed)
Requesting: xanax 0.25MG  Contract: 6/19 UDS: 4/19 Last Visit: 09/03/21 Next Visit: 12/09/21 Last Refill: 03/19/20  Please Advise

## 2021-10-15 ENCOUNTER — Other Ambulatory Visit (INDEPENDENT_AMBULATORY_CARE_PROVIDER_SITE_OTHER): Payer: Medicare HMO

## 2021-10-15 DIAGNOSIS — E782 Mixed hyperlipidemia: Secondary | ICD-10-CM

## 2021-10-15 DIAGNOSIS — E039 Hypothyroidism, unspecified: Secondary | ICD-10-CM

## 2021-10-15 LAB — LIPID PANEL
Cholesterol: 115 mg/dL (ref 0–200)
HDL: 31.8 mg/dL — ABNORMAL LOW (ref >39.00)
NonHDL: 82.81
Total CHOL/HDL Ratio: 4
Triglycerides: 245 mg/dL — ABNORMAL HIGH (ref 0.0–149.0)
VLDL: 49 mg/dL — ABNORMAL HIGH (ref 0.0–40.0)

## 2021-10-15 LAB — LDL CHOLESTEROL, DIRECT: Direct LDL: 50 mg/dL

## 2021-10-15 LAB — TSH: TSH: 4.48 u[IU]/mL (ref 0.35–5.50)

## 2021-10-17 DIAGNOSIS — C782 Secondary malignant neoplasm of pleura: Secondary | ICD-10-CM | POA: Diagnosis not present

## 2021-10-17 DIAGNOSIS — C4441 Basal cell carcinoma of skin of scalp and neck: Secondary | ICD-10-CM | POA: Diagnosis not present

## 2021-10-17 DIAGNOSIS — R432 Parageusia: Secondary | ICD-10-CM | POA: Diagnosis not present

## 2021-10-17 DIAGNOSIS — Z9049 Acquired absence of other specified parts of digestive tract: Secondary | ICD-10-CM | POA: Diagnosis not present

## 2021-10-17 DIAGNOSIS — C7951 Secondary malignant neoplasm of bone: Secondary | ICD-10-CM | POA: Diagnosis not present

## 2021-10-17 DIAGNOSIS — Z08 Encounter for follow-up examination after completed treatment for malignant neoplasm: Secondary | ICD-10-CM | POA: Diagnosis not present

## 2021-10-17 DIAGNOSIS — C770 Secondary and unspecified malignant neoplasm of lymph nodes of head, face and neck: Secondary | ICD-10-CM | POA: Diagnosis not present

## 2021-10-17 DIAGNOSIS — R234 Changes in skin texture: Secondary | ICD-10-CM | POA: Diagnosis not present

## 2021-10-17 DIAGNOSIS — R918 Other nonspecific abnormal finding of lung field: Secondary | ICD-10-CM | POA: Diagnosis not present

## 2021-10-17 DIAGNOSIS — Z923 Personal history of irradiation: Secondary | ICD-10-CM | POA: Diagnosis not present

## 2021-10-17 DIAGNOSIS — C7800 Secondary malignant neoplasm of unspecified lung: Secondary | ICD-10-CM | POA: Diagnosis not present

## 2021-11-28 DIAGNOSIS — C7951 Secondary malignant neoplasm of bone: Secondary | ICD-10-CM | POA: Diagnosis not present

## 2021-11-28 DIAGNOSIS — C799 Secondary malignant neoplasm of unspecified site: Secondary | ICD-10-CM | POA: Diagnosis not present

## 2021-11-28 DIAGNOSIS — C4441 Basal cell carcinoma of skin of scalp and neck: Secondary | ICD-10-CM | POA: Diagnosis not present

## 2021-11-28 DIAGNOSIS — C080 Malignant neoplasm of submandibular gland: Secondary | ICD-10-CM | POA: Diagnosis not present

## 2021-11-28 DIAGNOSIS — C7801 Secondary malignant neoplasm of right lung: Secondary | ICD-10-CM | POA: Diagnosis not present

## 2021-11-28 DIAGNOSIS — Z8616 Personal history of COVID-19: Secondary | ICD-10-CM | POA: Diagnosis not present

## 2021-12-09 ENCOUNTER — Telehealth (INDEPENDENT_AMBULATORY_CARE_PROVIDER_SITE_OTHER): Payer: Medicare HMO | Admitting: Family Medicine

## 2021-12-09 ENCOUNTER — Telehealth: Payer: Self-pay

## 2021-12-09 DIAGNOSIS — R112 Nausea with vomiting, unspecified: Secondary | ICD-10-CM | POA: Diagnosis not present

## 2021-12-09 DIAGNOSIS — I10 Essential (primary) hypertension: Secondary | ICD-10-CM | POA: Diagnosis not present

## 2021-12-09 DIAGNOSIS — E669 Obesity, unspecified: Secondary | ICD-10-CM | POA: Diagnosis not present

## 2021-12-09 DIAGNOSIS — C4441 Basal cell carcinoma of skin of scalp and neck: Secondary | ICD-10-CM

## 2021-12-09 DIAGNOSIS — E1169 Type 2 diabetes mellitus with other specified complication: Secondary | ICD-10-CM

## 2021-12-09 DIAGNOSIS — C7802 Secondary malignant neoplasm of left lung: Secondary | ICD-10-CM | POA: Diagnosis not present

## 2021-12-09 DIAGNOSIS — R0609 Other forms of dyspnea: Secondary | ICD-10-CM | POA: Diagnosis not present

## 2021-12-09 NOTE — Assessment & Plan Note (Signed)
Continues to follow with oncology and is on maintenance meds and tolerating them. No changes today

## 2021-12-09 NOTE — Progress Notes (Signed)
MyChart Video Visit    Virtual Visit via Video Note   This visit type was conducted due to national recommendations for restrictions regarding the COVID-19 Pandemic (e.g. social distancing) in an effort to limit this patient's exposure and mitigate transmission in our community. This patient is at least at moderate risk for complications without adequate follow up. This format is felt to be most appropriate for this patient at this time. Physical exam was limited by quality of the video and audio technology used for the visit. Shamaine, CMA was able to get the patient set up on a video visit.  Patient location: home Patient and provider in visit Provider location: Office  I discussed the limitations of evaluation and management by telemedicine and the availability of in person appointments. The patient expressed understanding and agreed to proceed.  Visit Date: 12/09/2021  Today's healthcare provider: Penni Homans, MD     Subjective:    Patient ID: Steven Ibarra, male    DOB: November 12, 1955, 66 y.o.   MRN: [id number]  Chief Complaint  Patient presents with   Follow-up    HPI Patient is in today for follow-up on chronic medical concerns.  He continues to follow closely with oncology due to his head and neck cancer and metastases to his lungs.  Overall he is stable and responsive to his current treatment.  He does note he had COVID in early August and has had an increased level of fatigue, dyspnea as well as loss of taste and poor appetite.  He is eating because he knows he needs to but he is not particularly hungry.  He notes nausea does occur after eating but antiemetics have been some helpful.  No abdominal pain, fevers and chills noted recently.  His shortness of breath and his anorexia are improving somewhat but have not improved back to his baseline since Denies CP/palp/HA/congestion/fevers or GU c/o. Taking meds as prescribed   Past Medical History:  Diagnosis Date   ALLERGIC  RHINITIS, SEASONAL 10/23/2009   Anxiety 09/08/2016   Basal cell carcinoma of neck 05/28/2011   r suprahyoid, radical neck dissection S/p 6 weeks of targeted radiation therapy    Cancer ([place])    CAP (community acquired pneumonia) 03/02/2016   Costochondritis 09/16/2015   Diabetes mellitus approx 2003   type 2   DIABETES MELLITUS, TYPE II 10/23/2009   DYSPNEA ON EXERTION 10/23/2009   ELEVATED BLOOD PRESSURE 04/23/2010   GERD (gastroesophageal reflux disease)    Hearing loss    History of radiation therapy 07/06/2011- 08/19/2011   33 Fractions to Right facial nodes through the right neck and right trigeminal nerve   History of radiation therapy 05/23/14, 05/25/14, 05/28/14, 05/30/14, 06/01/14   SBRT Right upper lullng mass 54 Gy in 3 fractions, Left lower lung mass and adjacent nodules 50 Gy in 5 fractions   History of radiation therapy 02/11/2015- 02/20/15   Right lung superior upper lobe 50 Gy in 5 fractions, Left Lung posterior lower lobe 50 Gy in 5 fractions   Hyperlipidemia    Hypertension    Does not see a cardiologist, has not had a stress, echo    Hypothyroid 01/28/2012   Lung cancer ([place]) 03/08/14   invasive squamous cell carcinoma   Mixed hyperlipidemia 10/23/2009   Obesity    OSA (obstructive sleep apnea)    Otitis externa of right ear 07/06/2012   Overweight(278.02) 10/23/2009   Pedal edema 09/28/2016   Preventative health care 09/30/2011   Pulled muscle 07/06/12  Right calf pain 09/08/2016   S/P radiation therapy 07/06/11 - 08/19/11   Right Facial and Right Neck Nodes and right Trigeminal  Nerve to Base of Skull/ Total Dose 6600 cGy/ 33 Fractions   SIRS (systemic inflammatory response syndrome) ([place]) 04/24/2019   Skin cancer    on nose, 9-10 yrs ago   SLEEP APNEA, OBSTRUCTIVE 10/23/2009   Sleep study Done at [facility]   SOB (shortness of breath) 09/16/2015   Tinea pedis of both feet 03/02/2016   Type II or unspecified type diabetes mellitus with unspecified complication, uncontrolled    type 2      Past Surgical History:  Procedure Laterality Date   LUNG BIOPSY Right 03/08/2014   RADICAL NECK DISSECTION  05/28/2011   Procedure: RADICAL NECK DISSECTION;  Surgeon: Melissa Montane, MD;  Location: [facility] OR;  Service: ENT;  Laterality: N/A;  Suprahyoid Neck Dissection   skin cancer removal     TONSILLECTOMY AND ADENOIDECTOMY      Family History  Problem Relation Age of Onset   Other Mother        CHF   Stroke Father    Hyperlipidemia Father    Heart disease Father        s/p valve replacement   Hyperlipidemia Brother    Obesity Brother    Other Brother        Back pain   Hyperlipidemia Brother    Hypertension Brother    Other Brother        Panic attacks   Hyperlipidemia Brother    Anesthesia problems Neg Hx     Social History   Socioeconomic History   Marital status: Married    Spouse name: Not on file   Number of children: Not on file   Years of education: Not on file   Highest education level: Not on file  Occupational History   Not on file  Tobacco Use   Smoking status: Former    Packs/day: 1.00    Years: 9.00    Total pack years: 9.00    Types: Cigarettes    Quit date: 03/31/1975    Years since quitting: 46.7   Smokeless tobacco: Never  Substance and Sexual Activity   Alcohol use: No    Alcohol/week: 0.0 standard drinks of alcohol    Comment: 4 beers a month, 06/12/11 rarely uses now   Drug use: No   Sexual activity: Yes  Other Topics Concern   Not on file  Social History Narrative   Patient is married.   Patient with a history of smoking one pack per day for approximately 29 years from ages of [age]. Patient denies ever having used smokeless tobacco. Patient with rare use of alcohol.      Mother died at age 7 from congestive heart failure complications. Father died at the age of 82 secondary to a stroke.   Social Determinants of Health   Financial Resource Strain: Not on file  Food Insecurity: Not on file  Transportation Needs: Not on file   Physical Activity: Not on file  Stress: Not on file  Social Connections: Not on file  Intimate Partner Violence: Not on file    Outpatient Medications Prior to Visit  Medication Sig Dispense Refill   Accu-Chek Softclix Lancets lancets USE TO CHECK BLOOD SUGAR TWICE DAILY 100 each 12   albuterol (VENTOLIN HFA) 108 (90 Base) MCG/ACT inhaler Inhale 1-2 puffs into the lungs every 4 (four) hours as needed for wheezing or shortness of  breath. 25.5 g 3   ALPRAZolam (XANAX) 0.25 MG tablet TAKE 1 TABLET(0.25 MG) BY MOUTH TWICE DAILY AS NEEDED FOR ANXIETY 60 tablet 1   Ascorbic Acid (VITAMIN C) 1000 MG tablet Take 1,000 mg by mouth daily.     cetirizine (ZYRTEC) 10 MG tablet Take 10 mg by mouth daily as needed for allergies.      colesevelam (WELCHOL) 625 MG tablet TAKE 3 TABLETS(1875 MG) BY MOUTH TWICE DAILY WITH A MEAL 540 tablet 0   diclofenac Sodium (VOLTAREN) 1 % GEL APPLY 2 GRAMS(NICKEL SIZE AMOUNT) TOPICALLY TWICE DAILY AS NEEDED 100 g 1   docusate sodium (COLACE) 100 MG capsule Take 100 mg by mouth daily.     famotidine (PEPCID) 40 MG tablet Take 1 tablet (40 mg total) by mouth daily. 90 tablet 0   fluticasone (FLONASE) 50 MCG/ACT nasal spray Place 2 sprays into both nostrils daily. 16 g 6   glucose blood (CONTOUR NEXT TEST) test strip USE TO CHECK BLOOD SUGAR TWICE DAILY 100 strip 12   ibuprofen (ADVIL,MOTRIN) 200 MG tablet Take 600 mg by mouth every 6 (six) hours as needed for pain.      insulin glargine-yfgn (SEMGLEE) 100 UNIT/ML Pen Inject 50 Units into the skin 2 (two) times daily. 100 units in the morning and 60 units in the evening 90 mL 1   insulin lispro (HUMALOG) 100 UNIT/ML injection INJECT 20 TO 24 UNITS INTO SKIN THREE TIMES DAILY WITH MEALS AS DIRECTED AS NEEDED (Patient taking differently: Inject 30 Units into the skin 3 (three) times daily with meals.) 100 mL 3   Insulin Pen Needle (PEN NEEDLES) 32G X 4 MM MISC Use with Semglee pen twice a day 200 each 1   Krill Oil 300 MG CAPS  Take 1 capsule by mouth daily.     levothyroxine (SYNTHROID) 112 MCG tablet Take 1 tablet (112 mcg total) by mouth daily before breakfast. 90 tablet 1   metFORMIN (GLUCOPHAGE) 500 MG tablet TAKE 2 TABLETS(1000 MG) BY MOUTH TWICE DAILY WITH A MEAL 360 tablet 1   metoprolol succinate (TOPROL-XL) 100 MG 24 hr tablet Take with or immediately following a meal. 90 tablet 1   Misc Natural Products (OSTEO BI-FLEX TRIPLE STRENGTH PO) Take 2 tablets by mouth daily.     mometasone (ASMANEX, 60 METERED DOSES,) 220 MCG/INH inhaler Inhale 1 puff into the lungs 2 (two) times daily. (Patient taking differently: Inhale 1 puff into the lungs 2 (two) times daily as needed (shortness of breath).) 1 Inhaler 5   Probiotic Product (PROBIOTIC DAILY PO) Take by mouth.     rosuvastatin (CRESTOR) 40 MG tablet TAKE 1 TABLET(40 MG) BY MOUTH DAILY (Patient not taking: Reported on 09/03/2021) 90 tablet 1   traMADol (ULTRAM) 50 MG tablet Take 1 tablet (50 mg total) by mouth every 6 (six) hours as needed for severe pain. 20 tablet 0   vismodegib (ERIVEDGE) 150 MG capsule Take by mouth.     fenofibrate 160 MG tablet TAKE 1 TABLET(160 MG) BY MOUTH DAILY.  NEEDS OV 30 tablet 0   No facility-administered medications prior to visit.    No Known Allergies  Review of Systems  Constitutional:  Positive for malaise/fatigue. Negative for fever.  HENT:  Negative for congestion.   Eyes:  Negative for blurred vision.  Respiratory:  Positive for shortness of breath.   Cardiovascular:  Negative for chest pain, palpitations and leg swelling.  Gastrointestinal:  Positive for nausea. Negative for abdominal pain and blood in  stool.  Genitourinary:  Negative for dysuria and frequency.  Musculoskeletal:  Negative for falls.  Skin:  Negative for rash.  Neurological:  Negative for dizziness, loss of consciousness and headaches.  Endo/Heme/Allergies:  Negative for environmental allergies.  Psychiatric/Behavioral:  Negative for depression. The  patient is not nervous/anxious.        Objective:    Physical Exam Constitutional:      General: He is not in acute distress.    Appearance: Normal appearance. He is not ill-appearing or toxic-appearing.  HENT:     Head: Normocephalic and atraumatic.     Right Ear: External ear normal.     Left Ear: External ear normal.     Nose: Nose normal.  Eyes:     General:        Right eye: No discharge.        Left eye: No discharge.  Pulmonary:     Effort: Pulmonary effort is normal.  Skin:    Findings: No rash.  Neurological:     Mental Status: He is alert and oriented to person, place, and time.  Psychiatric:        Behavior: Behavior normal.     BP 120/82  Wt Readings from Last 3 Encounters:  09/03/21 265 lb (120.2 kg)  12/11/20 275 lb (124.7 kg)  04/25/19 (!) 322 lb 1.5 oz (146.1 kg)    Diabetic Foot Exam - Simple   No data filed    Lab Results  Component Value Date   WBC 8.9 03/19/2020   HGB 14.5 03/19/2020   HCT 43.5 03/19/2020   PLT 304.0 03/19/2020   GLUCOSE 117 (H) 12/13/2020   CHOL 115 10/15/2021   TRIG 245.0 (H) 10/15/2021   HDL 31.80 (L) 10/15/2021   LDLDIRECT 50.0 10/15/2021   LDLCALC 32 03/19/2020   ALT 13 12/13/2020   AST 12 12/13/2020   NA 140 12/13/2020   K 4.4 12/13/2020   CL 102 12/13/2020   CREATININE 0.95 12/13/2020   BUN 12 12/13/2020   CO2 32 12/13/2020   TSH 4.48 10/15/2021   PSA 1.46 09/25/2011   INR 0.95 03/08/2014   HGBA1C 7.3 (H) 09/03/2021   MICROALBUR <0.7 09/03/2021    Lab Results  Component Value Date   TSH 4.48 10/15/2021   Lab Results  Component Value Date   WBC 8.9 03/19/2020   HGB 14.5 03/19/2020   HCT 43.5 03/19/2020   MCV 86.9 03/19/2020   PLT 304.0 03/19/2020   Lab Results  Component Value Date   NA 140 12/13/2020   K 4.4 12/13/2020   CO2 32 12/13/2020   GLUCOSE 117 (H) 12/13/2020   BUN 12 12/13/2020   CREATININE 0.95 12/13/2020   BILITOT 0.6 12/13/2020   ALKPHOS 52 12/13/2020   AST 12 12/13/2020    ALT 13 12/13/2020   PROT 5.9 (L) 12/13/2020   ALBUMIN 3.8 12/13/2020   CALCIUM 9.0 12/13/2020   ANIONGAP 9 04/28/2019   EGFR 70 (L) 01/10/2015   GFR 84.80 12/13/2020   Lab Results  Component Value Date   CHOL 115 10/15/2021   Lab Results  Component Value Date   HDL 31.80 (L) 10/15/2021   Lab Results  Component Value Date   LDLCALC 32 03/19/2020   Lab Results  Component Value Date   TRIG 245.0 (H) 10/15/2021   Lab Results  Component Value Date   CHOLHDL 4 10/15/2021   Lab Results  Component Value Date   HGBA1C 7.3 (H) 09/03/2021  Assessment & Plan:   Problem List Items Addressed This Visit     DOE (dyspnea on exertion)    Somewhat elevated from his baseline since having COVID last month but is improving. Increase activity systematically and slowly and notify us if improvement does not persist.       Basal cell carcinoma of neck   Hypertension    Well controlled, no changes to meds. Encouraged heart healthy diet such as the DASH diet and exercise as tolerated.       Diabetes mellitus type 2 in obese (HCC)    hgba1c acceptable, minimize simple carbs. Increase exercise as tolerated. Continue current meds      Malignant neoplasm metastatic to lung [facility])    Continues to follow with oncology and is on maintenance meds and tolerating them. No changes today      Nausea and vomiting    No persistent vomiting but nausea, poor appetite and poor taste are worsened since COVID. He is encouraged to increase protein intake and minimize carbohydrate intake. Let us know if symptoms worsen       I have discontinued Juanda Crumble E. Wilkie's fenofibrate. I am also having him maintain his cetirizine, ibuprofen, Misc Natural Products (OSTEO BI-FLEX TRIPLE STRENGTH PO), vitamin C, Krill Oil, Probiotic Product (PROBIOTIC DAILY PO), traMADol, fluticasone, mometasone, insulin lispro, albuterol, vismodegib, docusate sodium, famotidine, diclofenac Sodium, Contour Next Test, Pen  Needles, rosuvastatin, colesevelam, metFORMIN, Accu-Chek Softclix Lancets, metoprolol succinate, insulin glargine-yfgn, levothyroxine, and ALPRAZolam.  No orders of the defined types were placed in this encounter.   I discussed the assessment and treatment plan with the patient. The patient was provided an opportunity to ask questions and all were answered. The patient agreed with the plan and demonstrated an understanding of the instructions.   The patient was advised to call back or seek an in-person evaluation if the symptoms worsen or if the condition fails to improve as anticipated.   Penni Homans, MD [facility] [phone] (phone) [phone] (fax)  [place]

## 2021-12-09 NOTE — Assessment & Plan Note (Signed)
hgba1c acceptable, minimize simple carbs. Increase exercise as tolerated. Continue current meds 

## 2021-12-09 NOTE — Telephone Encounter (Signed)
Called pt Fu appt made

## 2021-12-09 NOTE — Assessment & Plan Note (Signed)
Somewhat elevated from his baseline since having COVID last month but is improving. Increase activity systematically and slowly and notify us if improvement does not persist.

## 2021-12-09 NOTE — Assessment & Plan Note (Signed)
Well controlled, no changes to meds. Encouraged heart healthy diet such as the DASH diet and exercise as tolerated.  °

## 2021-12-09 NOTE — Assessment & Plan Note (Signed)
No persistent vomiting but nausea, poor appetite and poor taste are worsened since COVID. He is encouraged to increase protein intake and minimize carbohydrate intake. Let us know if symptoms worsen

## 2021-12-16 ENCOUNTER — Other Ambulatory Visit: Payer: Self-pay | Admitting: Family

## 2022-01-09 DIAGNOSIS — R432 Parageusia: Secondary | ICD-10-CM | POA: Diagnosis not present

## 2022-01-09 DIAGNOSIS — S2242XD Multiple fractures of ribs, left side, subsequent encounter for fracture with routine healing: Secondary | ICD-10-CM | POA: Diagnosis not present

## 2022-01-09 DIAGNOSIS — R634 Abnormal weight loss: Secondary | ICD-10-CM | POA: Diagnosis not present

## 2022-01-09 DIAGNOSIS — K5900 Constipation, unspecified: Secondary | ICD-10-CM | POA: Diagnosis not present

## 2022-01-09 DIAGNOSIS — Z6831 Body mass index (BMI) 31.0-31.9, adult: Secondary | ICD-10-CM | POA: Diagnosis not present

## 2022-01-09 DIAGNOSIS — R630 Anorexia: Secondary | ICD-10-CM | POA: Diagnosis not present

## 2022-01-09 DIAGNOSIS — K8070 Calculus of gallbladder and bile duct without cholecystitis without obstruction: Secondary | ICD-10-CM | POA: Diagnosis not present

## 2022-01-09 DIAGNOSIS — Z8616 Personal history of COVID-19: Secondary | ICD-10-CM | POA: Diagnosis not present

## 2022-01-09 DIAGNOSIS — C799 Secondary malignant neoplasm of unspecified site: Secondary | ICD-10-CM | POA: Diagnosis not present

## 2022-01-09 DIAGNOSIS — C7801 Secondary malignant neoplasm of right lung: Secondary | ICD-10-CM | POA: Diagnosis not present

## 2022-01-09 DIAGNOSIS — C4441 Basal cell carcinoma of skin of scalp and neck: Secondary | ICD-10-CM | POA: Diagnosis not present

## 2022-01-09 DIAGNOSIS — K5903 Drug induced constipation: Secondary | ICD-10-CM | POA: Diagnosis not present

## 2022-01-13 ENCOUNTER — Other Ambulatory Visit: Payer: Self-pay | Admitting: Family Medicine

## 2022-02-13 DIAGNOSIS — C770 Secondary and unspecified malignant neoplasm of lymph nodes of head, face and neck: Secondary | ICD-10-CM | POA: Diagnosis not present

## 2022-02-13 DIAGNOSIS — C7801 Secondary malignant neoplasm of right lung: Secondary | ICD-10-CM | POA: Diagnosis not present

## 2022-02-13 DIAGNOSIS — C7951 Secondary malignant neoplasm of bone: Secondary | ICD-10-CM | POA: Diagnosis not present

## 2022-02-13 DIAGNOSIS — T66XXXA Radiation sickness, unspecified, initial encounter: Secondary | ICD-10-CM | POA: Diagnosis not present

## 2022-02-13 DIAGNOSIS — C4441 Basal cell carcinoma of skin of scalp and neck: Secondary | ICD-10-CM | POA: Diagnosis not present

## 2022-02-13 DIAGNOSIS — M8468XA Pathological fracture in other disease, other site, initial encounter for fracture: Secondary | ICD-10-CM | POA: Diagnosis not present

## 2022-02-13 DIAGNOSIS — C782 Secondary malignant neoplasm of pleura: Secondary | ICD-10-CM | POA: Diagnosis not present

## 2022-02-13 DIAGNOSIS — C080 Malignant neoplasm of submandibular gland: Secondary | ICD-10-CM | POA: Diagnosis not present

## 2022-02-13 DIAGNOSIS — J701 Chronic and other pulmonary manifestations due to radiation: Secondary | ICD-10-CM | POA: Diagnosis not present

## 2022-02-18 ENCOUNTER — Other Ambulatory Visit: Payer: Self-pay | Admitting: Family Medicine

## 2022-03-05 ENCOUNTER — Telehealth: Payer: Self-pay | Admitting: Family Medicine

## 2022-03-05 ENCOUNTER — Encounter: Payer: Self-pay | Admitting: Family Medicine

## 2022-03-05 ENCOUNTER — Other Ambulatory Visit: Payer: Self-pay

## 2022-03-05 ENCOUNTER — Encounter: Payer: Self-pay | Admitting: Family

## 2022-03-05 NOTE — Telephone Encounter (Signed)
Pt called stating that Dr. Charlett Blake was going to have him switch to a different insulin when he needed his next refill. Please Advise.  Prescription Request  03/05/2022  Is this a "Controlled Substance" medicine? No  LOV: Visit date not found  What is the name of the medication or equipment?   Insulin   Have you contacted your pharmacy to request a refill? No   Which pharmacy would you like this sent to?   [id number] - [place], [place] [name] [place] [address] Phone: [phone] Fax: [phone]   Patient notified that their request is being sent to the clinical staff for review and that they should receive a response within 2 business days.   Please advise at Mobile [phone] (mobile)

## 2022-03-06 ENCOUNTER — Other Ambulatory Visit: Payer: Self-pay

## 2022-03-06 MED ORDER — LANTUS SOLOSTAR 100 UNIT/ML ~~LOC~~ SOPN: 50.0000 [IU] | PEN_INJECTOR | Freq: Every day | SUBCUTANEOUS | 5 refills | Status: DC

## 2022-03-06 NOTE — Telephone Encounter (Signed)
Medication was sent

## 2022-04-03 DIAGNOSIS — K8050 Calculus of bile duct without cholangitis or cholecystitis without obstruction: Secondary | ICD-10-CM | POA: Diagnosis not present

## 2022-04-03 DIAGNOSIS — K5900 Constipation, unspecified: Secondary | ICD-10-CM | POA: Diagnosis not present

## 2022-04-03 DIAGNOSIS — Z6831 Body mass index (BMI) 31.0-31.9, adult: Secondary | ICD-10-CM | POA: Diagnosis not present

## 2022-04-03 DIAGNOSIS — R110 Nausea: Secondary | ICD-10-CM | POA: Diagnosis not present

## 2022-04-03 DIAGNOSIS — R630 Anorexia: Secondary | ICD-10-CM | POA: Diagnosis not present

## 2022-04-03 DIAGNOSIS — C7801 Secondary malignant neoplasm of right lung: Secondary | ICD-10-CM | POA: Diagnosis not present

## 2022-04-03 DIAGNOSIS — R112 Nausea with vomiting, unspecified: Secondary | ICD-10-CM | POA: Diagnosis not present

## 2022-04-03 DIAGNOSIS — S2242XG Multiple fractures of ribs, left side, subsequent encounter for fracture with delayed healing: Secondary | ICD-10-CM | POA: Diagnosis not present

## 2022-04-03 DIAGNOSIS — R634 Abnormal weight loss: Secondary | ICD-10-CM | POA: Diagnosis not present

## 2022-04-03 DIAGNOSIS — C7951 Secondary malignant neoplasm of bone: Secondary | ICD-10-CM | POA: Diagnosis not present

## 2022-04-03 DIAGNOSIS — R432 Parageusia: Secondary | ICD-10-CM | POA: Diagnosis not present

## 2022-04-03 DIAGNOSIS — C799 Secondary malignant neoplasm of unspecified site: Secondary | ICD-10-CM | POA: Diagnosis not present

## 2022-04-03 DIAGNOSIS — C4441 Basal cell carcinoma of skin of scalp and neck: Secondary | ICD-10-CM | POA: Diagnosis not present

## 2022-04-07 ENCOUNTER — Ambulatory Visit: Payer: Medicare HMO | Admitting: Family Medicine

## 2022-04-09 ENCOUNTER — Telehealth (INDEPENDENT_AMBULATORY_CARE_PROVIDER_SITE_OTHER): Payer: Medicare HMO | Admitting: Family Medicine

## 2022-04-09 DIAGNOSIS — B9789 Other viral agents as the cause of diseases classified elsewhere: Secondary | ICD-10-CM

## 2022-04-09 DIAGNOSIS — J019 Acute sinusitis, unspecified: Secondary | ICD-10-CM

## 2022-04-09 DIAGNOSIS — C7802 Secondary malignant neoplasm of left lung: Secondary | ICD-10-CM

## 2022-04-09 MED ORDER — AMOXICILLIN-POT CLAVULANATE 875-125 MG PO TABS: 1.0000 | ORAL_TABLET | Freq: Two times a day (BID) | ORAL | 0 refills | Status: DC

## 2022-04-09 MED ORDER — FLUTICASONE PROPIONATE 50 MCG/ACT NA SUSP: 2.0000 | Freq: Every day | NASAL | 6 refills | Status: DC

## 2022-04-09 NOTE — Progress Notes (Signed)
Virtual Visit via Video Note  I connected with Steven Ibarra on 04/09/2022 at  4:00 PM EST by a video enabled telemedicine application and verified that I am speaking with the correct person using two identifiers.  Location: Patient: Home Provider: Office   I discussed the limitations of evaluation and management by telemedicine and the availability of in person appointments. The patient expressed understanding and agreed to proceed.  History of Present Illness:  Chief Complaint  Patient presents with   Cough    Cough, yellow mucus, drainage x 3-4 days   Problem 1: The patient complains of sinus pressure, headaches, nasal congestion and cough with mucus production.  Additionally, the patient has been feeling unwell for about 3 to 4 days and has been experiencing sinus drainage and some pressure but denies any fevers. Despite testing negative for COVID-19, the patient's symptoms have persisted for about a week. There is a history of using Flonase for nasal congestion. TThe patient has been taking Mucinex sinus medication that may contain acetaminophen but indicated concerns with using medications containing dextromethorphan (DM) due to side effects. Tessalon pearls were also mentioned as a part of the current regimen for cough.   REVIEW OF SYSTEMS: HEENT: Sinus pressure and headaches. Cardiopulmonary: Negative for chest pain and shortness of breath. All other systems are negative as per patient report.    Observations/Objective: There were no vitals filed for this visit.   Gen: NAD, resting comfortably HEENT: EOMI Pulm: NWOB Skin: no rash on face Neuro: no facial asymmetry or dysmetria Psych: Normal affect   Assessment and Plan: Problem List Items Addressed This Visit       Respiratory   Malignant neoplasm metastatic to lung [facility]) - Primary   Relevant Medications   amoxicillin-clavulanate (AUGMENTIN) 875-125 MG tablet   Other Visit Diagnoses     Acute non-recurrent  sinusitis, unspecified location       Relevant Medications   amoxicillin-clavulanate (AUGMENTIN) 875-125 MG tablet   fluticasone (FLONASE) 50 MCG/ACT nasal spray      The patient reports symptoms consistent with an upper respiratory infection (URI), such as sinus congestion and cough, with mucus for approximately one week. The absence of fever and the presence of sinus pressure and drainage may be indicative of acute sinusitis.  Differential diagnosis:  Viral Upper Respiratory Infection (most likely): Given the commonness of viral etiologies and symptomatic presentation, a viral URI is highly probable. Acute Bacterial Sinusitis: Although less common without fever or prolonged symptoms, this could develop secondary to viral URI. Allergic Rhinitis: Could contribute to sinus pressure and drainage, though the patient doesn't report typical allergic symptomatology. Plan:  Advise the patient to continue using Mucinex as appropriate for symptom management. Encourage the regular use of Flonase to assist with nasal congestion and drainage. Monitor for any changes in symptoms and advise on the use of Tessalon pearls if the cough becomes problematic. If symptoms do not improve in a day or two, consider starting Augmentin as an empirical treatment for potential bacterial sinusitis. Provide a refill for Flonase and ensure the patient understands the correct administration technique. Follow up in person if there is no improvement after using the antibiotic or if symptoms worsen.  Follow Up Instructions: No follow-ups on file.    I discussed the assessment and treatment plan with the patient. The patient was provided an opportunity to ask questions and all were answered. The patient agreed with the plan and demonstrated an understanding of the instructions.   The patient  was advised to call back or seek an in-person evaluation if the symptoms worsen or if the condition fails to improve as anticipated.  I  provided 20 minutes of non-face-to-face time during this encounter.   Garnette Gunner, MD

## 2022-04-14 ENCOUNTER — Other Ambulatory Visit: Payer: Self-pay | Admitting: Family Medicine

## 2022-04-23 ENCOUNTER — Ambulatory Visit: Payer: Medicare HMO | Admitting: Family Medicine

## 2022-04-29 ENCOUNTER — Encounter: Payer: Self-pay | Admitting: Family

## 2022-04-29 ENCOUNTER — Telehealth: Payer: Self-pay | Admitting: Family

## 2022-04-29 ENCOUNTER — Ambulatory Visit (INDEPENDENT_AMBULATORY_CARE_PROVIDER_SITE_OTHER): Payer: Medicare HMO | Admitting: Family

## 2022-04-29 VITALS — BP 130/74 | HR 66 | Temp 97.5°F | Resp 16 | Ht 77.0 in | Wt 256.0 lb

## 2022-04-29 DIAGNOSIS — C4441 Basal cell carcinoma of skin of scalp and neck: Secondary | ICD-10-CM | POA: Diagnosis not present

## 2022-04-29 DIAGNOSIS — I10 Essential (primary) hypertension: Secondary | ICD-10-CM

## 2022-04-29 DIAGNOSIS — K219 Gastro-esophageal reflux disease without esophagitis: Secondary | ICD-10-CM

## 2022-04-29 DIAGNOSIS — E782 Mixed hyperlipidemia: Secondary | ICD-10-CM | POA: Diagnosis not present

## 2022-04-29 DIAGNOSIS — C7802 Secondary malignant neoplasm of left lung: Secondary | ICD-10-CM

## 2022-04-29 DIAGNOSIS — E039 Hypothyroidism, unspecified: Secondary | ICD-10-CM | POA: Diagnosis not present

## 2022-04-29 DIAGNOSIS — F419 Anxiety disorder, unspecified: Secondary | ICD-10-CM

## 2022-04-29 DIAGNOSIS — Z23 Encounter for immunization: Secondary | ICD-10-CM | POA: Diagnosis not present

## 2022-04-29 DIAGNOSIS — E1169 Type 2 diabetes mellitus with other specified complication: Secondary | ICD-10-CM | POA: Diagnosis not present

## 2022-04-29 DIAGNOSIS — M25512 Pain in left shoulder: Secondary | ICD-10-CM

## 2022-04-29 DIAGNOSIS — E669 Obesity, unspecified: Secondary | ICD-10-CM

## 2022-04-29 DIAGNOSIS — M25511 Pain in right shoulder: Secondary | ICD-10-CM | POA: Insufficient documentation

## 2022-04-29 LAB — LIPID PANEL
Cholesterol: 132 mg/dL (ref 0–200)
HDL: 33.5 mg/dL — ABNORMAL LOW (ref >39.00)
NonHDL: 98.58
Total CHOL/HDL Ratio: 4
Triglycerides: 228 mg/dL — ABNORMAL HIGH (ref 0.0–149.0)
VLDL: 45.6 mg/dL — ABNORMAL HIGH (ref 0.0–40.0)

## 2022-04-29 LAB — MICROALBUMIN / CREATININE URINE RATIO
Creatinine,U: 57.7 mg/dL
Microalb Creat Ratio: 1.2 mg/g (ref 0.0–30.0)
Microalb, Ur: <0.7 mg/dL (ref 0.0–1.9)

## 2022-04-29 LAB — HEMOGLOBIN A1C: Hgb A1c MFr Bld: 7.8 % — ABNORMAL HIGH (ref 4.6–6.5)

## 2022-04-29 LAB — LDL CHOLESTEROL, DIRECT: Direct LDL: 60 mg/dL

## 2022-04-29 LAB — TSH: TSH: 0.47 u[IU]/mL (ref 0.35–5.50)

## 2022-04-29 NOTE — Assessment & Plan Note (Signed)
Has not needed xanax in almost 1 year. Anxiety is well controlled. Would like to keep it on hand though. Update contract, obtain UDS.

## 2022-04-29 NOTE — Assessment & Plan Note (Signed)
Lab Results  Component Value Date   TSH 4.48 10/15/2021   Clinically stable on synthroid. Check TSH.

## 2022-04-29 NOTE — Progress Notes (Addendum)
Subjective:   By signing my name below, I, Steven Ibarra, attest that this documentation has been prepared under the direction and in the presence of Debbrah Alar, NP. 04/29/2022   Patient ID: Steven Ibarra, male    DOB: January 01, 1956, 67 y.o.   MRN: [id number]  Chief Complaint  Patient presents with   Anxiety    Follow up, doing well on medication.    Shoulder Pain    Complains of bilateral shoulder pain, more on the left    HPI Patient is in today for a office visit.   Shoulder pain: He complains of both shoulders hurting for the past week. His left shoulder hurts worse than his right. He has less ROM in his left shoulder compared to his right. He has tried ibuprofen and found no change in his pain. He denies having any prior injuries or falls. He is not exercising regularly and thinks that may be contributing to his pain.  Join pain: He has occasional knee pain every 6 months but does not have any at this time. He continues having hip pain and stiffness.   Anxiety: He has not taken his 0.25 mg xanax for the past 9 months due to not needing it. His anxiety is stable at this time.   Dermatology: He has not followed up with his dermatologist recently. He has a history of skin cancer and follows up with his oncologist every month. He reports his cancer is stable at this time. He notes having an appointment for a full body skin check but did not receive a call back.   A1C: His last A1c showed improvement. He continues taking Lantus in the morning and evening and reports no new issues while taking it. He had an episode of low blood sugar after skipping a meal. He has occasional neuropathy pain in his feet.  Lab Results  Component Value Date   HGBA1C 7.3 (H) 09/03/2021   Reflux: He has no recent episodes of reflux. He is taking 40 mg Pepcid daily PO.   Blood pressure: His blood pressure is stable during this visit while taking 100 mg metoprolol succinate daily PO.  BP Readings  from Last 3 Encounters:  04/29/22 130/74  12/09/21 120/82  09/03/21 137/83   Pulse Readings from Last 3 Encounters:  04/29/22 66  09/03/21 66  12/11/20 78   Thyroid: He continues taking 112 mcg levothyroxine daily PO and reports no new issues while taking it. Lab Results  Component Value Date   TSH 4.48 10/15/2021   Cholesterol: His last cholesterol levels showed elevated triglycerides. He continues taking 40 mg Crestor daily PO.  Lab Results  Component Value Date   CHOL 115 10/15/2021   HDL 31.80 (L) 10/15/2021   LDLCALC 32 03/19/2020   LDLDIRECT 50.0 10/15/2021   TRIG 245.0 (H) 10/15/2021   CHOLHDL 4 10/15/2021    Past Medical History:  Diagnosis Date   ALLERGIC RHINITIS, SEASONAL 10/23/2009   Anxiety 09/08/2016   Basal cell carcinoma of neck 05/28/2011   r suprahyoid, radical neck dissection S/p 6 weeks of targeted radiation therapy    Cancer ([place])    CAP (community acquired pneumonia) 03/02/2016   Costochondritis 09/16/2015   Diabetes mellitus approx 2003   type 2   DIABETES MELLITUS, TYPE II 10/23/2009   DYSPNEA ON EXERTION 10/23/2009   ELEVATED BLOOD PRESSURE 04/23/2010   GERD (gastroesophageal reflux disease)    Hearing loss    History of radiation therapy 07/06/2011- 08/19/2011   33  Fractions to Right facial nodes through the right neck and right trigeminal nerve   History of radiation therapy 05/23/14, 05/25/14, 05/28/14, 05/30/14, 06/01/14   SBRT Right upper lullng mass 54 Gy in 3 fractions, Left lower lung mass and adjacent nodules 50 Gy in 5 fractions   History of radiation therapy 02/11/2015- 02/20/15   Right lung superior upper lobe 50 Gy in 5 fractions, Left Lung posterior lower lobe 50 Gy in 5 fractions   Hyperlipidemia    Hypertension    Does not see a cardiologist, has not had a stress, echo    Hypothyroid 01/28/2012   Lung cancer ([place]) 03/08/2014   invasive squamous cell carcinoma   Mixed hyperlipidemia 10/23/2009   Obesity    OSA (obstructive sleep  apnea)    Otitis externa of right ear 07/06/2012   Overweight(278.02) 10/23/2009   Pedal edema 09/28/2016   Preventative health care 09/30/2011   Pulled muscle 07/06/2012   Pulmonary hemorrhage 03/08/2014   Right calf pain 09/08/2016   S/P radiation therapy 07/06/11 - 08/19/11   Right Facial and Right Neck Nodes and right Trigeminal  Nerve to Base of Skull/ Total Dose 6600 cGy/ 33 Fractions   Sepsis due to undetermined organism ([place]) 04/28/2019   SIRS (systemic inflammatory response syndrome) ([place]) 04/24/2019   Skin cancer    on nose, 9-10 yrs ago   SLEEP APNEA, OBSTRUCTIVE 10/23/2009   Sleep study Done at [facility]   SOB (shortness of breath) 09/16/2015   Tinea pedis of both feet 03/02/2016   Type II or unspecified type diabetes mellitus with unspecified complication, uncontrolled    type 2     Past Surgical History:  Procedure Laterality Date   LUNG BIOPSY Right 03/08/2014   RADICAL NECK DISSECTION  05/28/2011   Procedure: RADICAL NECK DISSECTION;  Surgeon: Darlys Buis Montane, MD;  Location: [facility] OR;  Service: ENT;  Laterality: N/A;  Suprahyoid Neck Dissection   skin cancer removal     TONSILLECTOMY AND ADENOIDECTOMY      Family History  Problem Relation Age of Onset   Other Mother        CHF   Stroke Father    Hyperlipidemia Father    Heart disease Father        s/p valve replacement   Hyperlipidemia Brother    Obesity Brother    Other Brother        Back pain   Hyperlipidemia Brother    Hypertension Brother    Other Brother        Panic attacks   Hyperlipidemia Brother    Anesthesia problems Neg Hx     Social History   Socioeconomic History   Marital status: Married    Spouse name: Not on file   Number of children: Not on file   Years of education: Not on file   Highest education level: Not on file  Occupational History   Not on file  Tobacco Use   Smoking status: Former    Packs/day: 1.00    Years: 9.00    Total pack years: 9.00    Types: Cigarettes    Quit  date: 03/31/1975    Years since quitting: 47.1   Smokeless tobacco: Never  Substance and Sexual Activity   Alcohol use: No    Alcohol/week: 0.0 standard drinks of alcohol    Comment: 4 beers a month, 06/12/11 rarely uses now   Drug use: No   Sexual activity: Yes  Other Topics Concern   Not  on file  Social History Narrative   Patient is married.   Patient with a history of smoking one pack per day for approximately 29 years from ages of [age]. Patient denies ever having used smokeless tobacco. Patient with rare use of alcohol.      Mother died at age 70 from congestive heart failure complications. Father died at the age of 4 secondary to a stroke.   Social Determinants of Health   Financial Resource Strain: Not on file  Food Insecurity: Not on file  Transportation Needs: Not on file  Physical Activity: Not on file  Stress: Not on file  Social Connections: Not on file  Intimate Partner Violence: Not on file    Outpatient Medications Prior to Visit  Medication Sig Dispense Refill   Accu-Chek Softclix Lancets lancets USE TO CHECK BLOOD SUGAR TWICE DAILY 100 each 12   albuterol (VENTOLIN HFA) 108 (90 Base) MCG/ACT inhaler Inhale 1-2 puffs into the lungs every 4 (four) hours as needed for wheezing or shortness of breath. 25.5 g 3   ALPRAZolam (XANAX) 0.25 MG tablet TAKE 1 TABLET(0.25 MG) BY MOUTH TWICE DAILY AS NEEDED FOR ANXIETY 60 tablet 1   amoxicillin-clavulanate (AUGMENTIN) 875-125 MG tablet Take 1 tablet by mouth 2 (two) times daily. 20 tablet 0   Ascorbic Acid (VITAMIN C) 1000 MG tablet Take 1,000 mg by mouth daily.     cetirizine (ZYRTEC) 10 MG tablet Take 10 mg by mouth daily as needed for allergies.      colesevelam (WELCHOL) 625 MG tablet TAKE 3 TABLETS(1875 MG) BY MOUTH TWICE DAILY WITH A MEAL 540 tablet 0   CONTOUR NEXT TEST test strip USE TO CHECK BLOOD SUGAR TWICE DAILY 100 strip 12   diclofenac Sodium (VOLTAREN) 1 % GEL APPLY 2 GRAMS(NICKEL SIZE AMOUNT) TOPICALLY TWICE  DAILY AS NEEDED 100 g 1   docusate sodium (COLACE) 100 MG capsule Take 100 mg by mouth daily.     famotidine (PEPCID) 40 MG tablet Take 1 tablet (40 mg total) by mouth daily. 90 tablet 0   fluticasone (FLONASE) 50 MCG/ACT nasal spray Place 2 sprays into both nostrils daily. 16 g 6   ibuprofen (ADVIL,MOTRIN) 200 MG tablet Take 600 mg by mouth every 6 (six) hours as needed for pain.      insulin glargine (LANTUS SOLOSTAR) 100 UNIT/ML Solostar Pen Inject 50 Units into the skin daily. 50 units in am and 60 units in pm. 15 mL 5   Insulin Pen Needle (PEN NEEDLES) 32G X 4 MM MISC Use with Semglee pen twice a day 200 each 1   Krill Oil 300 MG CAPS Take 1 capsule by mouth daily.     levothyroxine (SYNTHROID) 112 MCG tablet TAKE 1 TABLET(112 MCG) BY MOUTH DAILY BEFORE BREAKFAST 90 tablet 1   metFORMIN (GLUCOPHAGE) 500 MG tablet Take 2 tablets (1,000 mg total) by mouth 2 (two) times daily with a meal. 360 tablet 1   metoprolol succinate (TOPROL-XL) 100 MG 24 hr tablet TAKE 1 TABLET BY MOUTH DAILY WITH OR IMMEDIATELY FOLLOWING A MEAL 90 tablet 1   Misc Natural Products (OSTEO BI-FLEX TRIPLE STRENGTH PO) Take 2 tablets by mouth daily.     Probiotic Product (PROBIOTIC DAILY PO) Take by mouth.     rosuvastatin (CRESTOR) 40 MG tablet TAKE 1 TABLET(40 MG) BY MOUTH DAILY 90 tablet 1   vismodegib (ERIVEDGE) 150 MG capsule Take by mouth.     traMADol (ULTRAM) 50 MG tablet Take 1 tablet (50 mg  total) by mouth every 6 (six) hours as needed for severe pain. 20 tablet 0   No facility-administered medications prior to visit.    No Known Allergies  Review of Systems  Musculoskeletal:        (+)bilateral shoulder pain (L>R)       Objective:    Physical Exam Constitutional:      General: He is not in acute distress.    Appearance: Normal appearance. He is not ill-appearing.  HENT:     Head: Normocephalic and atraumatic.     Right Ear: External ear normal.     Left Ear: External ear normal.  Eyes:      Extraocular Movements: Extraocular movements intact.     Pupils: Pupils are equal, round, and reactive to light.  Cardiovascular:     Rate and Rhythm: Normal rate and regular rhythm.     Pulses:          Dorsalis pedis pulses are 2+ on the right side and 2+ on the left side.       Posterior tibial pulses are 2+ on the right side and 2+ on the left side.     Heart sounds: Normal heart sounds. No murmur heard.    No gallop.  Pulmonary:     Effort: Pulmonary effort is normal. No respiratory distress.     Breath sounds: Normal breath sounds. No wheezing or rales.  Musculoskeletal:     Right shoulder: Normal.     Left shoulder: Decreased range of motion.     Comments: Limited abduction left shoulder  Skin:    General: Skin is warm and dry.  Neurological:     Mental Status: He is alert and oriented to person, place, and time.  Psychiatric:        Judgment: Judgment normal.     BP 130/74 (BP Location: Right Arm, Patient Position: Sitting, Cuff Size: Large)   Pulse 66   Temp (!) 97.5 F (36.4 C) (Oral)   Resp 16   Ht 6\' 5"  (1.956 m)   Wt 256 lb (116.1 kg)   SpO2 100%   BMI 30.36 kg/m  Wt Readings from Last 3 Encounters:  04/29/22 256 lb (116.1 kg)  09/03/21 265 lb (120.2 kg)  12/11/20 275 lb (124.7 kg)       Assessment & Plan:  Anxiety Assessment & Plan: Has not needed xanax in almost 1 year. Anxiety is well controlled. Would like to keep it on hand though. Update contract, obtain UDS.   Orders: -     Drug Monitoring Panel [phone] , Urine -     Hemoglobin A1c  Bilateral shoulder pain, unspecified chronicity Assessment & Plan: Uncontrolled. Refer to sports med.   Orders: -     Ambulatory referral to Sports Medicine  Basal cell carcinoma of neck Assessment & Plan: Not currently seeing dermatology. Will arrange follow up skin check.   Orders: -     Ambulatory referral to Dermatology  Diabetes mellitus type 2 in obese [facility]) Assessment & Plan: Lab Results   Component Value Date   HGBA1C 7.3 (H) 09/03/2021   HGBA1C 7.9 (H) 12/13/2020   HGBA1C 8.0 (H) 03/19/2020   Lab Results  Component Value Date   MICROALBUR <0.7 09/03/2021   LDLCALC 32 03/19/2020   CREATININE 0.95 12/13/2020   Continues Lantus 50 units in AM and 60 units PM.  Orders: -     Microalbumin / creatinine urine ratio  Gastroesophageal reflux disease without esophagitis Assessment &  Plan: Stable on Pepcid.  Continue same.    Primary hypertension Assessment & Plan: BP Readings from Last 3 Encounters:  04/29/22 130/74  12/09/21 120/82  09/03/21 137/83   Stable on metoprolol.    Hypothyroidism, unspecified type Assessment & Plan: Lab Results  Component Value Date   TSH 4.48 10/15/2021   Clinically stable on synthroid. Check TSH.   Orders: -     TSH  Malignant neoplasm metastatic to left lung [facility]) Assessment & Plan: Followed at [facility] by oncology for monthly appointments.    Mixed hyperlipidemia Assessment & Plan: Lab Results  Component Value Date   CHOL 115 10/15/2021   HDL 31.80 (L) 10/15/2021   LDLCALC 32 03/19/2020   LDLDIRECT 50.0 10/15/2021   TRIG 245.0 (H) 10/15/2021   CHOLHDL 4 10/15/2021   Continues crestor.   Orders: -     Lipid panel  Need for 23-polyvalent pneumococcal polysaccharide vaccine -     Pneumococcal polysaccharide vaccine 23-valent greater than or equal to 2yo subcutaneous/IM    I, Nance Pear, NP, personally preformed the services described in this documentation.  All medical record entries made by the scribe were at my direction and in my presence.  I have reviewed the chart and discharge instructions (if applicable) and agree that the record reflects my personal performance and is accurate and complete. 04/29/2022   I,Steven Ibarra,acting as a Education administrator for Nance Pear, NP.,have documented all relevant documentation on the behalf of Nance Pear, NP,as directed by  Nance Pear, NP  while in the presence of Nance Pear, NP.   Nance Pear, NP

## 2022-04-29 NOTE — Assessment & Plan Note (Signed)
Stable on Pepcid.  Continue same.

## 2022-04-29 NOTE — Assessment & Plan Note (Signed)
Lab Results  Component Value Date   HGBA1C 7.3 (H) 09/03/2021   HGBA1C 7.9 (H) 12/13/2020   HGBA1C 8.0 (H) 03/19/2020   Lab Results  Component Value Date   MICROALBUR <0.7 09/03/2021   LDLCALC 32 03/19/2020   CREATININE 0.95 12/13/2020   Continues Lantus 50 units in AM and 60 units PM.

## 2022-04-29 NOTE — Assessment & Plan Note (Signed)
Lab Results  Component Value Date   CHOL 115 10/15/2021   HDL 31.80 (L) 10/15/2021   LDLCALC 32 03/19/2020   LDLDIRECT 50.0 10/15/2021   TRIG 245.0 (H) 10/15/2021   CHOLHDL 4 10/15/2021   Continues crestor.

## 2022-04-29 NOTE — Telephone Encounter (Signed)
Please call Dr. Clydene Laming (eye doctor in [place]) and request a copy of last DM eye exam.

## 2022-04-29 NOTE — Assessment & Plan Note (Signed)
Uncontrolled. Refer to sports med.

## 2022-04-29 NOTE — Assessment & Plan Note (Signed)
Not currently seeing dermatology. Will arrange follow up skin check.

## 2022-04-29 NOTE — Assessment & Plan Note (Signed)
Followed at [facility] by oncology for monthly appointments.

## 2022-04-29 NOTE — Assessment & Plan Note (Signed)
BP Readings from Last 3 Encounters:  04/29/22 130/74  12/09/21 120/82  09/03/21 137/83   Stable on metoprolol.

## 2022-04-30 ENCOUNTER — Encounter: Payer: Self-pay | Admitting: *Deleted

## 2022-04-30 NOTE — Telephone Encounter (Signed)
Request sent

## 2022-05-01 LAB — DRUG MONITORING PANEL 376104, URINE
Amphetamines: NEGATIVE ng/mL (ref <500)
Barbiturates: NEGATIVE ng/mL (ref <300)
Benzodiazepines: NEGATIVE ng/mL (ref <100)
Cocaine Metabolite: NEGATIVE ng/mL (ref <150)
Desmethyltramadol: NEGATIVE ng/mL (ref <100)
Opiates: NEGATIVE ng/mL (ref <100)
Oxycodone: NEGATIVE ng/mL (ref <100)
Tramadol: NEGATIVE ng/mL (ref <100)

## 2022-05-01 LAB — DM TEMPLATE

## 2022-05-13 ENCOUNTER — Encounter: Payer: Self-pay | Admitting: Family Medicine

## 2022-05-13 ENCOUNTER — Ambulatory Visit: Payer: Self-pay

## 2022-05-13 ENCOUNTER — Ambulatory Visit: Payer: Medicare HMO | Admitting: Family Medicine

## 2022-05-13 VITALS — BP 122/72 | Ht 77.0 in | Wt 256.0 lb

## 2022-05-13 DIAGNOSIS — M7551 Bursitis of right shoulder: Secondary | ICD-10-CM

## 2022-05-13 DIAGNOSIS — M778 Other enthesopathies, not elsewhere classified: Secondary | ICD-10-CM

## 2022-05-13 MED ORDER — METHYLPREDNISOLONE ACETATE 40 MG/ML IJ SUSP
40.0000 mg | Freq: Once | INTRAMUSCULAR | Status: AC
  Administered 2022-05-13: 40 mg via INTRAMUSCULAR

## 2022-05-13 MED ORDER — TRIAMCINOLONE ACETONIDE 40 MG/ML IJ SUSP
40.0000 mg | Freq: Once | INTRAMUSCULAR | Status: AC
  Administered 2022-05-13: 40 mg via INTRA_ARTICULAR

## 2022-05-13 NOTE — Progress Notes (Signed)
Steven Ibarra - 67 y.o. male MRN [id number]  Date of birth: [birth year]  SUBJECTIVE:  Including CC & ROS.  No chief complaint on file.   Steven Ibarra is a 67 y.o. male that is presenting with acute bilateral shoulder pain.  The pain has been ongoing for at least a month or 2.  Denies any injury or inciting event.  He is undergoing treatment for his lung cancer.  No history of shoulder surgery.    Review of Systems See HPI   HISTORY: Past Medical, Surgical, Social, and Family History Reviewed & Updated per EMR.   Pertinent Historical Findings include:  Past Medical History:  Diagnosis Date   ALLERGIC RHINITIS, SEASONAL 10/23/2009   Anxiety 09/08/2016   Basal cell carcinoma of neck 05/28/2011   r suprahyoid, radical neck dissection S/p 6 weeks of targeted radiation therapy    Cancer ([place])    CAP (community acquired pneumonia) 03/02/2016   Costochondritis 09/16/2015   Diabetes mellitus approx 2003   type 2   DIABETES MELLITUS, TYPE II 10/23/2009   DYSPNEA ON EXERTION 10/23/2009   ELEVATED BLOOD PRESSURE 04/23/2010   GERD (gastroesophageal reflux disease)    Hearing loss    History of radiation therapy 07/06/2011- 08/19/2011   33 Fractions to Right facial nodes through the right neck and right trigeminal nerve   History of radiation therapy 05/23/14, 05/25/14, 05/28/14, 05/30/14, 06/01/14   SBRT Right upper lullng mass 54 Gy in 3 fractions, Left lower lung mass and adjacent nodules 50 Gy in 5 fractions   History of radiation therapy 02/11/2015- 02/20/15   Right lung superior upper lobe 50 Gy in 5 fractions, Left Lung posterior lower lobe 50 Gy in 5 fractions   Hyperlipidemia    Hypertension    Does not see a cardiologist, has not had a stress, echo    Hypothyroid 01/28/2012   Lung cancer ([place]) 03/08/2014   invasive squamous cell carcinoma   Mixed hyperlipidemia 10/23/2009   Obesity    OSA (obstructive sleep apnea)    Otitis externa of right ear 07/06/2012   Overweight(278.02)  10/23/2009   Pedal edema 09/28/2016   Preventative health care 09/30/2011   Pulled muscle 07/06/2012   Pulmonary hemorrhage 03/08/2014   Right calf pain 09/08/2016   S/P radiation therapy 07/06/11 - 08/19/11   Right Facial and Right Neck Nodes and right Trigeminal  Nerve to Base of Skull/ Total Dose 6600 cGy/ 33 Fractions   Sepsis due to undetermined organism ([place]) 04/28/2019   SIRS (systemic inflammatory response syndrome) ([place]) 04/24/2019   Skin cancer    on nose, 9-10 yrs ago   SLEEP APNEA, OBSTRUCTIVE 10/23/2009   Sleep study Done at [facility]   SOB (shortness of breath) 09/16/2015   Tinea pedis of both feet 03/02/2016   Type II or unspecified type diabetes mellitus with unspecified complication, uncontrolled    type 2     Past Surgical History:  Procedure Laterality Date   LUNG BIOPSY Right 03/08/2014   RADICAL NECK DISSECTION  05/28/2011   Procedure: RADICAL NECK DISSECTION;  Surgeon: Melissa Montane, MD;  Location: [facility] OR;  Service: ENT;  Laterality: N/A;  Suprahyoid Neck Dissection   skin cancer removal     TONSILLECTOMY AND ADENOIDECTOMY       PHYSICAL EXAM:  VS: BP 122/72   Ht 6\' 5"  (1.956 m)   Wt 256 lb (116.1 kg)   BMI 30.36 kg/m  Physical Exam Gen: NAD, alert, cooperative with exam, well-appearing MSK:  Neurovascularly intact    Injection/Aspiration Steven Ibarra [birth year]  Procedure: Injection Indications: right shoulder pain   Procedure Details Consent: Risks of procedure as well as the alternatives and risks of each were explained to the (patient/caregiver).  Consent for procedure obtained. Time Out: Verified patient identification, verified procedure, site/side was marked, verified correct patient position, special equipment/implants available, medications/allergies/relevent history reviewed, required imaging and test results available.  Performed.  The area was cleaned with iodine and alcohol swabs.   The Right subacromial space was injected using 1 cc's  of 40 mg Depo-Medrol and 4 cc's of 0.25% bupivacaine was injected.  Ultrasound was used. Images were obtained in long views showing the injection.     A sterile dressing was applied.  Patient did tolerate procedure well.  Aspiration/Injection Procedure Note Steven Ibarra June 03, 1955  Procedure: Injection Indications: left shoulder pain  Procedure Details Consent: Risks of procedure as well as the alternatives and risks of each were explained to the (patient/caregiver).  Consent for procedure obtained. Time Out: Verified patient identification, verified procedure, site/side was marked, verified correct patient position, special equipment/implants available, medications/allergies/relevent history reviewed, required imaging and test results available.  Performed.  The area was cleaned with iodine and alcohol swabs.    The left glenohumeral joint was injected using 4 cc of 1% lidocaine and 0.4 cc of 8.4% sodium bicarbonate on a 22-gauge 3-1/2 inch needle.  The syringe was switched to mixture containing 1 cc's of 40 mg Kenalog and 4 cc's of 0.25% bupivacaine was injected.  Ultrasound was used. Images were obtained in long views showing the injection.     A sterile dressing was applied.  Patient did tolerate procedure well.     ASSESSMENT & PLAN:   Capsulitis of left shoulder Acutely occurring.  Has limited range of motion on testing on the left shoulder. -Counseled on home exercise therapy and supportive care. -Intra-articular injection. -Could consider other imaging, physical therapy, nerve block or Hydro dilation.  Subacromial bursitis of right shoulder joint Acutely occurring.  Does have calcification with in the soft tissue next to the insertion of the biceps tendon. -Counseled on home exercise therapy and supportive care. -Subacromial injection. -Could consider shockwave therapy or physical therapy.

## 2022-05-13 NOTE — Assessment & Plan Note (Signed)
Acutely occurring.  Does have calcification with in the soft tissue next to the insertion of the biceps tendon. -Counseled on home exercise therapy and supportive care. -Subacromial injection. -Could consider shockwave therapy or physical therapy.

## 2022-05-13 NOTE — Patient Instructions (Signed)
Nice to meet you Please try heat before exercise and ice after  Please try the exercise   You can try voltaren over the counter gel as needed Please send me a message in MyChart with any questions or updates.  Please see me back in 4-6 weeks.   --Dr. Raeford Razor

## 2022-05-13 NOTE — Assessment & Plan Note (Signed)
Acutely occurring.  Has limited range of motion on testing on the left shoulder. -Counseled on home exercise therapy and supportive care. -Intra-articular injection. -Could consider other imaging, physical therapy, nerve block or Hydro dilation.

## 2022-05-15 DIAGNOSIS — C4441 Basal cell carcinoma of skin of scalp and neck: Secondary | ICD-10-CM | POA: Diagnosis not present

## 2022-05-15 DIAGNOSIS — Z8616 Personal history of COVID-19: Secondary | ICD-10-CM | POA: Diagnosis not present

## 2022-05-15 DIAGNOSIS — Z6830 Body mass index (BMI) 30.0-30.9, adult: Secondary | ICD-10-CM | POA: Diagnosis not present

## 2022-05-15 DIAGNOSIS — K8070 Calculus of gallbladder and bile duct without cholecystitis without obstruction: Secondary | ICD-10-CM | POA: Diagnosis not present

## 2022-05-15 DIAGNOSIS — C7801 Secondary malignant neoplasm of right lung: Secondary | ICD-10-CM | POA: Diagnosis not present

## 2022-05-15 DIAGNOSIS — C7800 Secondary malignant neoplasm of unspecified lung: Secondary | ICD-10-CM | POA: Diagnosis not present

## 2022-05-15 DIAGNOSIS — Z923 Personal history of irradiation: Secondary | ICD-10-CM | POA: Diagnosis not present

## 2022-05-15 DIAGNOSIS — C782 Secondary malignant neoplasm of pleura: Secondary | ICD-10-CM | POA: Diagnosis not present

## 2022-05-15 DIAGNOSIS — Z85820 Personal history of malignant melanoma of skin: Secondary | ICD-10-CM | POA: Diagnosis not present

## 2022-05-15 DIAGNOSIS — R432 Parageusia: Secondary | ICD-10-CM | POA: Diagnosis not present

## 2022-05-15 DIAGNOSIS — C7951 Secondary malignant neoplasm of bone: Secondary | ICD-10-CM | POA: Diagnosis not present

## 2022-05-15 DIAGNOSIS — Z9221 Personal history of antineoplastic chemotherapy: Secondary | ICD-10-CM | POA: Diagnosis not present

## 2022-05-15 DIAGNOSIS — R630 Anorexia: Secondary | ICD-10-CM | POA: Diagnosis not present

## 2022-05-15 DIAGNOSIS — C779 Secondary and unspecified malignant neoplasm of lymph node, unspecified: Secondary | ICD-10-CM | POA: Diagnosis not present

## 2022-05-15 DIAGNOSIS — K5900 Constipation, unspecified: Secondary | ICD-10-CM | POA: Diagnosis not present

## 2022-06-10 ENCOUNTER — Other Ambulatory Visit: Payer: Self-pay | Admitting: Family Medicine

## 2022-06-12 DIAGNOSIS — C7951 Secondary malignant neoplasm of bone: Secondary | ICD-10-CM | POA: Diagnosis not present

## 2022-06-12 DIAGNOSIS — R432 Parageusia: Secondary | ICD-10-CM | POA: Diagnosis not present

## 2022-06-12 DIAGNOSIS — C4441 Basal cell carcinoma of skin of scalp and neck: Secondary | ICD-10-CM | POA: Diagnosis not present

## 2022-06-12 DIAGNOSIS — C7800 Secondary malignant neoplasm of unspecified lung: Secondary | ICD-10-CM | POA: Diagnosis not present

## 2022-06-14 ENCOUNTER — Other Ambulatory Visit: Payer: Self-pay | Admitting: Family Medicine

## 2022-06-17 ENCOUNTER — Ambulatory Visit: Payer: Medicare HMO | Admitting: Family Medicine

## 2022-07-13 ENCOUNTER — Encounter: Payer: Self-pay | Admitting: *Deleted

## 2022-07-13 ENCOUNTER — Other Ambulatory Visit: Payer: Self-pay | Admitting: Family Medicine

## 2022-07-16 ENCOUNTER — Other Ambulatory Visit: Payer: Self-pay | Admitting: Family Medicine

## 2022-07-24 DIAGNOSIS — R222 Localized swelling, mass and lump, trunk: Secondary | ICD-10-CM | POA: Diagnosis not present

## 2022-07-24 DIAGNOSIS — C7800 Secondary malignant neoplasm of unspecified lung: Secondary | ICD-10-CM | POA: Diagnosis not present

## 2022-07-24 DIAGNOSIS — S2242XS Multiple fractures of ribs, left side, sequela: Secondary | ICD-10-CM | POA: Diagnosis not present

## 2022-07-24 DIAGNOSIS — C4441 Basal cell carcinoma of skin of scalp and neck: Secondary | ICD-10-CM | POA: Diagnosis not present

## 2022-07-24 DIAGNOSIS — S2242XK Multiple fractures of ribs, left side, subsequent encounter for fracture with nonunion: Secondary | ICD-10-CM | POA: Diagnosis not present

## 2022-07-24 DIAGNOSIS — Z08 Encounter for follow-up examination after completed treatment for malignant neoplasm: Secondary | ICD-10-CM | POA: Diagnosis not present

## 2022-07-24 DIAGNOSIS — C7801 Secondary malignant neoplasm of right lung: Secondary | ICD-10-CM | POA: Diagnosis not present

## 2022-07-24 DIAGNOSIS — C779 Secondary and unspecified malignant neoplasm of lymph node, unspecified: Secondary | ICD-10-CM | POA: Diagnosis not present

## 2022-07-24 DIAGNOSIS — C770 Secondary and unspecified malignant neoplasm of lymph nodes of head, face and neck: Secondary | ICD-10-CM | POA: Diagnosis not present

## 2022-07-24 DIAGNOSIS — Z85828 Personal history of other malignant neoplasm of skin: Secondary | ICD-10-CM | POA: Diagnosis not present

## 2022-07-24 DIAGNOSIS — C799 Secondary malignant neoplasm of unspecified site: Secondary | ICD-10-CM | POA: Diagnosis not present

## 2022-07-24 DIAGNOSIS — C7802 Secondary malignant neoplasm of left lung: Secondary | ICD-10-CM | POA: Diagnosis not present

## 2022-07-24 DIAGNOSIS — Z923 Personal history of irradiation: Secondary | ICD-10-CM | POA: Diagnosis not present

## 2022-07-24 DIAGNOSIS — C7951 Secondary malignant neoplasm of bone: Secondary | ICD-10-CM | POA: Diagnosis not present

## 2022-07-24 DIAGNOSIS — R110 Nausea: Secondary | ICD-10-CM | POA: Diagnosis not present

## 2022-07-24 DIAGNOSIS — C4491 Basal cell carcinoma of skin, unspecified: Secondary | ICD-10-CM | POA: Diagnosis not present

## 2022-08-03 ENCOUNTER — Ambulatory Visit: Payer: Medicare HMO | Admitting: Family Medicine

## 2022-08-05 ENCOUNTER — Other Ambulatory Visit: Payer: Self-pay | Admitting: Family Medicine

## 2022-08-05 DIAGNOSIS — E1169 Type 2 diabetes mellitus with other specified complication: Secondary | ICD-10-CM

## 2022-08-08 ENCOUNTER — Other Ambulatory Visit: Payer: Self-pay | Admitting: Family Medicine

## 2022-09-11 DIAGNOSIS — C7951 Secondary malignant neoplasm of bone: Secondary | ICD-10-CM | POA: Diagnosis not present

## 2022-09-11 DIAGNOSIS — C782 Secondary malignant neoplasm of pleura: Secondary | ICD-10-CM | POA: Diagnosis not present

## 2022-09-11 DIAGNOSIS — C7800 Secondary malignant neoplasm of unspecified lung: Secondary | ICD-10-CM | POA: Diagnosis not present

## 2022-09-11 DIAGNOSIS — C779 Secondary and unspecified malignant neoplasm of lymph node, unspecified: Secondary | ICD-10-CM | POA: Diagnosis not present

## 2022-09-11 DIAGNOSIS — C7802 Secondary malignant neoplasm of left lung: Secondary | ICD-10-CM | POA: Diagnosis not present

## 2022-09-11 DIAGNOSIS — C4491 Basal cell carcinoma of skin, unspecified: Secondary | ICD-10-CM | POA: Diagnosis not present

## 2022-09-16 ENCOUNTER — Other Ambulatory Visit: Payer: Self-pay | Admitting: Family Medicine

## 2022-10-02 ENCOUNTER — Other Ambulatory Visit: Payer: Self-pay | Admitting: Family Medicine

## 2022-10-07 ENCOUNTER — Ambulatory Visit: Payer: Medicare HMO

## 2022-10-14 ENCOUNTER — Other Ambulatory Visit: Payer: Self-pay | Admitting: Family Medicine

## 2022-10-14 DIAGNOSIS — J019 Acute sinusitis, unspecified: Secondary | ICD-10-CM

## 2022-10-15 ENCOUNTER — Ambulatory Visit: Payer: Medicare HMO | Admitting: Family Medicine

## 2022-10-23 DIAGNOSIS — C7951 Secondary malignant neoplasm of bone: Secondary | ICD-10-CM | POA: Diagnosis not present

## 2022-10-23 DIAGNOSIS — C779 Secondary and unspecified malignant neoplasm of lymph node, unspecified: Secondary | ICD-10-CM | POA: Diagnosis not present

## 2022-10-23 DIAGNOSIS — C4491 Basal cell carcinoma of skin, unspecified: Secondary | ICD-10-CM | POA: Diagnosis not present

## 2022-10-23 DIAGNOSIS — R432 Parageusia: Secondary | ICD-10-CM | POA: Diagnosis not present

## 2022-10-23 DIAGNOSIS — R110 Nausea: Secondary | ICD-10-CM | POA: Diagnosis not present

## 2022-10-23 DIAGNOSIS — C782 Secondary malignant neoplasm of pleura: Secondary | ICD-10-CM | POA: Diagnosis not present

## 2022-10-28 ENCOUNTER — Ambulatory Visit: Payer: Medicare HMO | Admitting: *Deleted

## 2022-10-28 ENCOUNTER — Encounter (INDEPENDENT_AMBULATORY_CARE_PROVIDER_SITE_OTHER): Payer: Self-pay

## 2022-10-28 VITALS — BP 124/70 | HR 71 | Ht 77.0 in | Wt 254.0 lb

## 2022-10-28 DIAGNOSIS — Z0000 Encounter for general adult medical examination without abnormal findings: Secondary | ICD-10-CM | POA: Diagnosis not present

## 2022-10-28 NOTE — Patient Instructions (Signed)
Steven Ibarra , Thank you for taking time to come for your Medicare Wellness Visit. I appreciate your ongoing commitment to your health goals. Please review the following plan we discussed and let me know if I can assist you in the future.   These are the goals we discussed:  Goals   None     This is a list of the screening recommended for you and due dates:  Health Maintenance  Topic Date Due   COVID-19 Vaccine (1) Never done   Zoster (Shingles) Vaccine (1 of 2) Never done   Eye exam for diabetics  12/11/2021   Yearly kidney function blood test for diabetes  12/13/2021   Colon Cancer Screening  03/18/2022   Hemoglobin A1C  10/28/2022   Flu Shot  10/29/2022   Yearly kidney health urinalysis for diabetes  04/30/2023   Complete foot exam   04/30/2023   Medicare Annual Wellness Visit  10/28/2023   DTaP/Tdap/Td vaccine (3 - Td or Tdap) 09/04/2031   Pneumonia Vaccine  Completed   Hepatitis C Screening  Completed   HPV Vaccine  Aged Out     Next appointment: Follow up in one year for your annual wellness visit.   Preventive Care 66 Years and Older, Male Preventive care refers to lifestyle choices and visits with your health care provider that can promote health and wellness. What does preventive care include? A yearly physical exam. This is also called an annual well check. Dental exams once or twice a year. Routine eye exams. Ask your health care provider how often you should have your eyes checked. Personal lifestyle choices, including: Daily care of your teeth and gums. Regular physical activity. Eating a healthy diet. Avoiding tobacco and drug use. Limiting alcohol use. Practicing safe sex. Taking low doses of aspirin every day. Taking vitamin and mineral supplements as recommended by your health care provider. What happens during an annual well check? The services and screenings done by your health care provider during your annual well check will depend on your age, overall  health, lifestyle risk factors, and family history of disease. Counseling  Your health care provider may ask you questions about your: Alcohol use. Tobacco use. Drug use. Emotional well-being. Home and relationship well-being. Sexual activity. Eating habits. History of falls. Memory and ability to understand (cognition). Work and work Astronomer. Screening  You may have the following tests or measurements: Height, weight, and BMI. Blood pressure. Lipid and cholesterol levels. These may be checked every 5 years, or more frequently if you are over 51 years old. Skin check. Lung cancer screening. You may have this screening every year starting at age 11 if you have a 30-pack-year history of smoking and currently smoke or have quit within the past 15 years. Fecal occult blood test (FOBT) of the stool. You may have this test every year starting at age 56. Flexible sigmoidoscopy or colonoscopy. You may have a sigmoidoscopy every 5 years or a colonoscopy every 10 years starting at age 78. Prostate cancer screening. Recommendations will vary depending on your family history and other risks. Hepatitis C blood test. Hepatitis B blood test. Sexually transmitted disease (STD) testing. Diabetes screening. This is done by checking your blood sugar (glucose) after you have not eaten for a while (fasting). You may have this done every 1-3 years. Abdominal aortic aneurysm (AAA) screening. You may need this if you are a current or former smoker. Osteoporosis. You may be screened starting at age 63 if you are at  high risk. Talk with your health care provider about your test results, treatment options, and if necessary, the need for more tests. Vaccines  Your health care provider may recommend certain vaccines, such as: Influenza vaccine. This is recommended every year. Tetanus, diphtheria, and acellular pertussis (Tdap, Td) vaccine. You may need a Td booster every 10 years. Zoster vaccine. You may  need this after age 24. Pneumococcal 13-valent conjugate (PCV13) vaccine. One dose is recommended after age 73. Pneumococcal polysaccharide (PPSV23) vaccine. One dose is recommended after age 69. Talk to your health care provider about which screenings and vaccines you need and how often you need them. This information is not intended to replace advice given to you by your health care provider. Make sure you discuss any questions you have with your health care provider. Document Released: 04/12/2015 Document Revised: 12/04/2015 Document Reviewed: 01/15/2015 Elsevier Interactive Patient Education  2017 [employer].  Fall Prevention in the Home Falls can cause injuries. They can happen to people of all ages. There are many things you can do to make your home safe and to help prevent falls. What can I do on the outside of my home? Regularly fix the edges of walkways and driveways and fix any cracks. Remove anything that might make you trip as you walk through a door, such as a raised step or threshold. Trim any bushes or trees on the path to your home. Use bright outdoor lighting. Clear any walking paths of anything that might make someone trip, such as rocks or tools. Regularly check to see if handrails are loose or broken. Make sure that both sides of any steps have handrails. Any raised decks and porches should have guardrails on the edges. Have any leaves, snow, or ice cleared regularly. Use sand or salt on walking paths during winter. Clean up any spills in your garage right away. This includes oil or grease spills. What can I do in the bathroom? Use night lights. Install grab bars by the toilet and in the tub and shower. Do not use towel bars as grab bars. Use non-skid mats or decals in the tub or shower. If you need to sit down in the shower, use a plastic, non-slip stool. Keep the floor dry. Clean up any water that spills on the floor as soon as it happens. Remove soap buildup in  the tub or shower regularly. Attach bath mats securely with double-sided non-slip rug tape. Do not have throw rugs and other things on the floor that can make you trip. What can I do in the bedroom? Use night lights. Make sure that you have a light by your bed that is easy to reach. Do not use any sheets or blankets that are too big for your bed. They should not hang down onto the floor. Have a firm chair that has side arms. You can use this for support while you get dressed. Do not have throw rugs and other things on the floor that can make you trip. What can I do in the kitchen? Clean up any spills right away. Avoid walking on wet floors. Keep items that you use a lot in easy-to-reach places. If you need to reach something above you, use a strong step stool that has a grab bar. Keep electrical cords out of the way. Do not use floor polish or wax that makes floors slippery. If you must use wax, use non-skid floor wax. Do not have throw rugs and other things on the floor that  can make you trip. What can I do with my stairs? Do not leave any items on the stairs. Make sure that there are handrails on both sides of the stairs and use them. Fix handrails that are broken or loose. Make sure that handrails are as long as the stairways. Check any carpeting to make sure that it is firmly attached to the stairs. Fix any carpet that is loose or worn. Avoid having throw rugs at the top or bottom of the stairs. If you do have throw rugs, attach them to the floor with carpet tape. Make sure that you have a light switch at the top of the stairs and the bottom of the stairs. If you do not have them, ask someone to add them for you. What else can I do to help prevent falls? Wear shoes that: Do not have high heels. Have rubber bottoms. Are comfortable and fit you well. Are closed at the toe. Do not wear sandals. If you use a stepladder: Make sure that it is fully opened. Do not climb a closed  stepladder. Make sure that both sides of the stepladder are locked into place. Ask someone to hold it for you, if possible. Clearly mark and make sure that you can see: Any grab bars or handrails. First and last steps. Where the edge of each step is. Use tools that help you move around (mobility aids) if they are needed. These include: Canes. Walkers. Scooters. Crutches. Turn on the lights when you go into a dark area. Replace any light bulbs as soon as they burn out. Set up your furniture so you have a clear path. Avoid moving your furniture around. If any of your floors are uneven, fix them. If there are any pets around you, be aware of where they are. Review your medicines with your doctor. Some medicines can make you feel dizzy. This can increase your chance of falling. Ask your doctor what other things that you can do to help prevent falls. This information is not intended to replace advice given to you by your health care provider. Make sure you discuss any questions you have with your health care provider. Document Released: 01/10/2009 Document Revised: 08/22/2015 Document Reviewed: 04/20/2014 Elsevier Interactive Patient Education  2017 [employer].

## 2022-10-28 NOTE — Progress Notes (Signed)
Subjective:   Steven Ibarra is a 67 y.o. male who presents for an Initial Medicare Annual Wellness Visit.  Visit Complete: Virtual  I connected with  [name] on 10/28/22 by a audio enabled telemedicine application and verified that I am speaking with the correct person using two identifiers.  Patient Location: Home  Provider Location: Office/Clinic  I discussed the limitations of evaluation and management by telemedicine. The patient expressed understanding and agreed to proceed.  Patient Medicare AWV questionnaire was completed by the patient on 10/28/22; I have confirmed that all information answered by patient is correct and no changes since this date.  Review of Systems     Cardiac Risk Factors include: advanced age (>17men, >62 women);male gender;diabetes mellitus;dyslipidemia;hypertension     Objective:    Today's Vitals   10/28/22 1318  BP: 124/70  Pulse: 71  Weight: 254 lb (115.2 kg)  Height: 6\' 5"  (1.956 m)   Body mass index is 30.12 kg/m.     10/28/2022    1:05 PM 04/25/2019    4:37 AM 04/24/2019    3:57 PM 04/24/2016   11:32 AM 12/18/2015   10:53 AM 11/16/2015    9:16 PM 09/04/2015   11:33 AM  Advanced Directives  Does Patient Have a Medical Advance Directive? No No No No No No No  Would patient like information on creating a medical advance directive? No - Patient declined No - Patient declined  No - Patient declined Yes - Educational materials given  Yes - Educational materials given    Current Medications (verified) Outpatient Encounter Medications as of 10/28/2022  Medication Sig   Accu-Chek Softclix Lancets lancets USE TO CHECK BLOOD SUGAR TWICE DAILY   albuterol (VENTOLIN HFA) 108 (90 Base) MCG/ACT inhaler Inhale 1-2 puffs into the lungs every 4 (four) hours as needed for wheezing or shortness of breath.   ALPRAZolam (XANAX) 0.25 MG tablet TAKE 1 TABLET(0.25 MG) BY MOUTH TWICE DAILY AS NEEDED FOR ANXIETY   Ascorbic Acid (VITAMIN C) 1000 MG  tablet Take 1,000 mg by mouth daily.   BD PEN NEEDLE NANO 2ND GEN 32G X 4 MM MISC USE WITH SEMGLEE PEN TWICE DAILY   cetirizine (ZYRTEC) 10 MG tablet Take 10 mg by mouth daily as needed for allergies.    CONTOUR NEXT TEST test strip USE TO CHECK BLOOD SUGAR TWICE DAILY   diclofenac Sodium (VOLTAREN) 1 % GEL APPLY 2 GRAMS(NICKEL SIZE AMOUNT) TOPICALLY TWICE DAILY AS NEEDED   docusate sodium (COLACE) 100 MG capsule Take 100 mg by mouth daily.   famotidine (PEPCID) 40 MG tablet Take 1 tablet (40 mg total) by mouth daily.   fluticasone (FLONASE) 50 MCG/ACT nasal spray SHAKE LIQUID AND USE 2 SPRAYS IN EACH NOSTRIL DAILY   ibuprofen (ADVIL,MOTRIN) 200 MG tablet Take 600 mg by mouth every 6 (six) hours as needed for pain.    insulin glargine (LANTUS SOLOSTAR) 100 UNIT/ML Solostar Pen INJECT 50 UNITS INTO THE SKIN EVERY MORNING AND 60 UNITS EVERY EVENING   Krill Oil 300 MG CAPS Take 1 capsule by mouth daily.   levothyroxine (SYNTHROID) 112 MCG tablet Take 1 tablet (112 mcg total) by mouth daily before breakfast.   metFORMIN (GLUCOPHAGE) 500 MG tablet TAKE 2 TABLETS(1000 MG) BY MOUTH TWICE DAILY WITH A MEAL   metoprolol succinate (TOPROL-XL) 100 MG 24 hr tablet Take 1 tablet (100 mg total) by mouth daily. Take with or immediately following a meal   Misc Natural Products (OSTEO BI-FLEX TRIPLE  STRENGTH PO) Take 2 tablets by mouth daily.   Probiotic Product (PROBIOTIC DAILY PO) Take by mouth.   rosuvastatin (CRESTOR) 40 MG tablet TAKE 1 TABLET(40 MG) BY MOUTH DAILY   vismodegib (ERIVEDGE) 150 MG capsule Take by mouth.   [DISCONTINUED] amoxicillin-clavulanate (AUGMENTIN) 875-125 MG tablet Take 1 tablet by mouth 2 (two) times daily.   [DISCONTINUED] colesevelam (WELCHOL) 625 MG tablet TAKE 3 TABLETS(1875 MG) BY MOUTH TWICE DAILY WITH A MEAL   No facility-administered encounter medications on file as of 10/28/2022.    Allergies (verified) Patient has no known allergies.   History: Past Medical History:   Diagnosis Date   ALLERGIC RHINITIS, SEASONAL 10/23/2009   Anxiety 09/08/2016   Basal cell carcinoma of neck 05/28/2011   r suprahyoid, radical neck dissection S/p 6 weeks of targeted radiation therapy    Cancer (HCC)    CAP (community acquired pneumonia) 03/02/2016   Costochondritis 09/16/2015   Diabetes mellitus approx 2003   type 2   DIABETES MELLITUS, TYPE II 10/23/2009   DYSPNEA ON EXERTION 10/23/2009   ELEVATED BLOOD PRESSURE 04/23/2010   GERD (gastroesophageal reflux disease)    Hearing loss    History of radiation therapy 07/06/2011- 08/19/2011   33 Fractions to Right facial nodes through the right neck and right trigeminal nerve   History of radiation therapy 05/23/14, 05/25/14, 05/28/14, 05/30/14, 06/01/14   SBRT Right upper lullng mass 54 Gy in 3 fractions, Left lower lung mass and adjacent nodules 50 Gy in 5 fractions   History of radiation therapy 02/11/2015- 02/20/15   Right lung superior upper lobe 50 Gy in 5 fractions, Left Lung posterior lower lobe 50 Gy in 5 fractions   Hyperlipidemia    Hypertension    Does not see a cardiologist, has not had a stress, echo    Hypothyroid 01/28/2012   Lung cancer (HCC) 03/08/2014   invasive squamous cell carcinoma   Mixed hyperlipidemia 10/23/2009   Obesity    OSA (obstructive sleep apnea)    Otitis externa of right ear 07/06/2012   Overweight(278.02) 10/23/2009   Pedal edema 09/28/2016   Preventative health care 09/30/2011   Pulled muscle 07/06/2012   Pulmonary hemorrhage 03/08/2014   Right calf pain 09/08/2016   S/P radiation therapy 07/06/11 - 08/19/11   Right Facial and Right Neck Nodes and right Trigeminal  Nerve to Base of Skull/ Total Dose 6600 cGy/ 33 Fractions   Sepsis due to undetermined organism (HCC) 04/28/2019   SIRS (systemic inflammatory response syndrome) (HCC) 04/24/2019   Skin cancer    on nose, 9-10 yrs ago   SLEEP APNEA, OBSTRUCTIVE 10/23/2009   Sleep study Done at [facility]   SOB (shortness of breath)  09/16/2015   Tinea pedis of both feet 03/02/2016   Type II or unspecified type diabetes mellitus with unspecified complication, uncontrolled    type 2    Past Surgical History:  Procedure Laterality Date   LUNG BIOPSY Right 03/08/2014   RADICAL NECK DISSECTION  05/28/2011   Procedure: RADICAL NECK DISSECTION;  Surgeon: Suzanna Obey, MD;  Location: [facility];  Service: ENT;  Laterality: N/A;  Suprahyoid Neck Dissection   skin cancer removal     TONSILLECTOMY AND ADENOIDECTOMY     Family History  Problem Relation Age of Onset   Other Mother        CHF   Stroke Father    Hyperlipidemia Father    Heart disease Father        s/p valve replacement  Hyperlipidemia Brother    Obesity Brother    Other Brother        Back pain   Hyperlipidemia Brother    Hypertension Brother    Other Brother        Panic attacks   Hyperlipidemia Brother    Anesthesia problems Neg Hx    Social History   Socioeconomic History   Marital status: Married    Spouse name: Not on file   Number of children: Not on file   Years of education: Not on file   Highest education level: Not on file  Occupational History   Not on file  Tobacco Use   Smoking status: Former    Current packs/day: 0.00    Average packs/day: 1 pack/day for 9.0 years (9.0 ttl pk-yrs)    Types: Cigarettes    Start date: 03/30/1966    Quit date: 03/31/1975    Years since quitting: 47.6   Smokeless tobacco: Never  Substance and Sexual Activity   Alcohol use: No    Alcohol/week: 0.0 standard drinks of alcohol    Comment: 4 beers a month, 06/12/11 rarely uses now   Drug use: No   Sexual activity: Yes  Other Topics Concern   Not on file  Social History Narrative   Patient is married.   Patient with a history of smoking one pack per day for approximately 29 years from ages of [age]. Patient denies ever having used smokeless tobacco. Patient with rare use of alcohol.      Mother died at age 27 from congestive heart failure complications.  Father died at the age of 28 secondary to a stroke.   Social Determinants of Health   Financial Resource Strain: Low Risk  (10/28/2022)   Overall Financial Resource Strain (CARDIA)    Difficulty of Paying Living Expenses: Not hard at all  Food Insecurity: No Food Insecurity (10/28/2022)   Hunger Vital Sign    Worried About Running Out of Food in the Last Year: Never true    Ran Out of Food in the Last Year: Never true  Transportation Needs: No Transportation Needs (10/28/2022)   PRAPARE - Administrator, Civil Service (Medical): No    Lack of Transportation (Non-Medical): No  Physical Activity: Insufficiently Active (10/28/2022)   Exercise Vital Sign    Days of Exercise per Week: 1 day    Minutes of Exercise per Session: 10 min  Stress: No Stress Concern Present (10/28/2022)   [employer] of Occupational Health - Occupational Stress Questionnaire    Feeling of Stress : Not at all  Social Connections: Socially Integrated (10/28/2022)   Social Connection and Isolation Panel [NHANES]    Frequency of Communication with Friends and Family: Twice a week    Frequency of Social Gatherings with Friends and Family: Once a week    Attends Religious Services: More than 4 times per year    Active Member of [employer] or Organizations: Yes    Attends Engineer, structural: More than 4 times per year    Marital Status: Married    Tobacco Counseling Counseling given: Not Answered   Clinical Intake:  Pre-visit preparation completed: Yes  Pain : No/denies pain  Nutritional Risks: None Diabetes: Yes CBG done?: No Did pt. bring in CBG monitor from home?: No  How often do you need to have someone help you when you read instructions, pamphlets, or other written materials from your doctor or pharmacy?: 1 - Never  Interpreter Needed?:  No  Information entered by :: Donne Anon, CMA   Activities of Daily Living    10/28/2022   12:43 PM  In your present state of health,  do you have any difficulty performing the following activities:  Hearing? 0  Vision? 0  Difficulty concentrating or making decisions? 0  Walking or climbing stairs? 0  Dressing or bathing? 0  Doing errands, shopping? 0  Preparing Food and eating ? N  Using the Toilet? N  In the past six months, have you accidently leaked urine? N  Do you have problems with loss of bowel control? N  Managing your Medications? N  Managing your Finances? N  Housekeeping or managing your Housekeeping? N    Patient Care Team: Bradd Canary, MD as PCP - General CRAIG WOOD, OD (Optometry)  Indicate any recent Medical Services you may have received from other than [facility] providers in the past year (date may be approximate).     Assessment:   This is a routine wellness examination for Mora.  Hearing/Vision screen No results found.  Dietary issues and exercise activities discussed:     Goals Addressed   None    Depression Screen    10/28/2022    1:05 PM 04/29/2022    9:59 AM 09/03/2021    9:58 AM 04/24/2016   11:32 AM 12/18/2015   10:54 AM 09/04/2015   11:33 AM 04/08/2015    8:01 AM  PHQ 2/9 Scores  PHQ - 2 Score 0 0 0 0 0 0 0    Fall Risk    10/28/2022   12:43 PM 04/29/2022    9:59 AM 04/24/2016   11:32 AM 12/18/2015   10:54 AM 09/04/2015   11:33 AM  Fall Risk   Falls in the past year? 0 0 No No No  Number falls in past yr: 0 0     Injury with Fall? 0 0     Risk for fall due to : No Fall Risks No Fall Risks     Follow up Falls evaluation completed Falls evaluation completed       MEDICARE RISK AT HOME:   TIMED UP AND GO:  Was the test performed? No    Cognitive Function:        10/28/2022    1:08 PM  6CIT Screen  What Year? 0 points  What month? 0 points  What time? 0 points  Count back from 20 0 points  Months in reverse 0 points  Repeat phrase 4 points  Total Score 4 points    Immunizations Immunization History  Administered Date(s) Administered   Influenza Split  01/28/2012   Influenza Whole 02/11/2010, 01/29/2011   Influenza,inj,Quad PF,6+ Mos 01/19/2013, 12/28/2013, 02/13/2015, 01/22/2016, 12/29/2016, 12/27/2018   Influenza-Unspecified 01/28/2018, 12/28/2021   Pneumococcal Conjugate-13 01/19/2013, 03/04/2015   Pneumococcal Polysaccharide-23 11/19/2015, 04/29/2022   Td 09/03/2021   Tdap 05/29/2010    TDAP status: Up to date  Flu Vaccine status: Up to date  Pneumococcal vaccine status: Up to date  Covid-19 vaccine status: Information provided on how to obtain vaccines.   Qualifies for Shingles Vaccine? Yes   Zostavax completed No   Shingrix Completed?: No.    Education has been provided regarding the importance of this vaccine. Patient has been advised to call insurance company to determine out of pocket expense if they have not yet received this vaccine. Advised may also receive vaccine at local pharmacy or Health Dept. Verbalized acceptance and understanding.  Screening Tests Health Maintenance  Topic Date Due   Medicare Annual Wellness (AWV)  Never done   COVID-19 Vaccine (1) Never done   Zoster Vaccines- Shingrix (1 of 2) Never done   OPHTHALMOLOGY EXAM  12/11/2021   Diabetic kidney evaluation - eGFR measurement  12/13/2021   Colonoscopy  03/18/2022   HEMOGLOBIN A1C  10/28/2022   INFLUENZA VACCINE  10/29/2022   Diabetic kidney evaluation - Urine ACR  04/30/2023   FOOT EXAM  04/30/2023   DTaP/Tdap/Td (3 - Td or Tdap) 09/04/2031   Pneumonia Vaccine 59+ Years old  Completed   Hepatitis C Screening  Completed   HPV VACCINES  Aged Out    Health Maintenance  Health Maintenance Due  Topic Date Due   Medicare Annual Wellness (AWV)  Never done   COVID-19 Vaccine (1) Never done   Zoster Vaccines- Shingrix (1 of 2) Never done   OPHTHALMOLOGY EXAM  12/11/2021   Diabetic kidney evaluation - eGFR measurement  12/13/2021   Colonoscopy  03/18/2022   HEMOGLOBIN A1C  10/28/2022    Colorectal cancer screening: Type of screening:  Colonoscopy. Completed 03/18/12. Repeat every 10 years  Lung Cancer Screening: (Low Dose CT Chest recommended if Age 5-80 years, 20 pack-year currently smoking OR have quit w/in 15years.) does not qualify.   Additional Screening:  Hepatitis C Screening: does qualify; Completed 09/10/15  Vision Screening: Recommended annual ophthalmology exams for early detection of glaucoma and other disorders of the eye. Is the patient up to date with their annual eye exam?  No  Who is the provider or what is the name of the office in which the patient attends annual eye exams? Dr. Velna Ochs If pt is not established with a provider, would they like to be referred to a provider to establish care? No .   Dental Screening: Recommended annual dental exams for proper oral hygiene  Diabetic Foot Exam: Diabetic Foot Exam: Completed 04/29/22  Community Resource Referral / Chronic Care Management: CRR required this visit?  No   CCM required this visit?  No    Plan:     I have personally reviewed and noted the following in the patient's chart:   Medical and social history Use of alcohol, tobacco or illicit drugs  Current medications and supplements including opioid prescriptions. Patient is not currently taking opioid prescriptions. Functional ability and status Nutritional status Physical activity Advanced directives List of other physicians Hospitalizations, surgeries, and ER visits in previous 12 months Vitals Screenings to include cognitive, depression, and falls Referrals and appointments  In addition, I have reviewed and discussed with patient certain preventive protocols, quality metrics, and best practice recommendations. A written personalized care plan for preventive services as well as general preventive health recommendations were provided to patient.     Donne Anon, CMA   10/28/2022   After Visit Summary: (MyChart) Due to this being a telephonic visit, the after visit summary with  patients personalized plan was offered to patient via MyChart   Nurse Notes: None

## 2022-11-13 ENCOUNTER — Other Ambulatory Visit: Payer: Self-pay | Admitting: Family Medicine

## 2022-11-18 ENCOUNTER — Ambulatory Visit (INDEPENDENT_AMBULATORY_CARE_PROVIDER_SITE_OTHER): Payer: Medicare HMO | Admitting: Family Medicine

## 2022-11-18 ENCOUNTER — Encounter: Payer: Self-pay | Admitting: Family Medicine

## 2022-11-18 VITALS — BP 132/77 | HR 67 | Ht 77.0 in | Wt 254.0 lb

## 2022-11-18 DIAGNOSIS — E782 Mixed hyperlipidemia: Secondary | ICD-10-CM

## 2022-11-18 DIAGNOSIS — Z0000 Encounter for general adult medical examination without abnormal findings: Secondary | ICD-10-CM

## 2022-11-18 DIAGNOSIS — E669 Obesity, unspecified: Secondary | ICD-10-CM

## 2022-11-18 DIAGNOSIS — E1169 Type 2 diabetes mellitus with other specified complication: Secondary | ICD-10-CM | POA: Diagnosis not present

## 2022-11-18 DIAGNOSIS — Z794 Long term (current) use of insulin: Secondary | ICD-10-CM | POA: Diagnosis not present

## 2022-11-18 DIAGNOSIS — I10 Essential (primary) hypertension: Secondary | ICD-10-CM | POA: Diagnosis not present

## 2022-11-18 DIAGNOSIS — E039 Hypothyroidism, unspecified: Secondary | ICD-10-CM | POA: Diagnosis not present

## 2022-11-18 LAB — LIPID PANEL
Cholesterol: 182 mg/dL (ref 0–200)
HDL: 27.6 mg/dL — ABNORMAL LOW (ref >39.00)
NonHDL: 153.96
Total CHOL/HDL Ratio: 7
Triglycerides: 368 mg/dL — ABNORMAL HIGH (ref 0.0–149.0)
VLDL: 73.6 mg/dL — ABNORMAL HIGH (ref 0.0–40.0)

## 2022-11-18 LAB — HEMOGLOBIN A1C: Hgb A1c MFr Bld: 7.6 % — ABNORMAL HIGH (ref 4.6–6.5)

## 2022-11-18 LAB — MICROALBUMIN / CREATININE URINE RATIO
Creatinine,U: 60.9 mg/dL
Microalb Creat Ratio: 1.1 mg/g (ref 0.0–30.0)
Microalb, Ur: <0.7 mg/dL (ref 0.0–1.9)

## 2022-11-18 LAB — LDL CHOLESTEROL, DIRECT: Direct LDL: 95 mg/dL

## 2022-11-18 NOTE — Assessment & Plan Note (Signed)
Continue Synthroid at current dose  Recheck TSH and adjust Synthroid as indicated   

## 2022-11-18 NOTE — Progress Notes (Signed)
Complete physical exam  Patient: Steven Ibarra   DOB: [birth year]   67 y.o. Male  MRN: [id number]  Subjective:    Chief Complaint  Patient presents with   Annual Exam    Steven Ibarra is a 67 y.o. male who presents today for a complete physical exam. He reports consuming a general diet. Home exercise routine includes swimming. He generally feels fairly well. He reports sleeping fairly well. He does not have additional problems to discuss today.   Currently lives with: wife Acute concerns or interim problems since last visit:  -He recently had COVID and took Paxlovid. Recent tests were negative. Feeling mostly back to baseline now.   Vision concerns: no concerns Dental concerns: no concerns  STD concerns: no concerns  Patient denies ETOH use. Patient denies nicotine use. Patient denies illegal substance use.     Most recent fall risk assessment:    11/18/2022   10:06 AM  Fall Risk   Falls in the past year? 1  Number falls in past yr: 0  Injury with Fall? 0  Risk for fall due to : History of fall(s)  Follow up Falls evaluation completed     Most recent depression screenings:    11/18/2022   10:06 AM 10/28/2022    1:05 PM  PHQ 2/9 Scores  PHQ - 2 Score 0 0            Patient Care Team: Bradd Canary, MD as PCP - General CRAIG WOOD, OD (Optometry)   Outpatient Medications Prior to Visit  Medication Sig   Accu-Chek Softclix Lancets lancets USE TO CHECK BLOOD SUGAR TWICE DAILY   albuterol (VENTOLIN HFA) 108 (90 Base) MCG/ACT inhaler Inhale 1-2 puffs into the lungs every 4 (four) hours as needed for wheezing or shortness of breath.   ALPRAZolam (XANAX) 0.25 MG tablet TAKE 1 TABLET(0.25 MG) BY MOUTH TWICE DAILY AS NEEDED FOR ANXIETY   Ascorbic Acid (VITAMIN C) 1000 MG tablet Take 1,000 mg by mouth daily.   BD PEN NEEDLE NANO 2ND GEN 32G X 4 MM MISC USE WITH SEMGLEE PEN TWICE DAILY   cetirizine (ZYRTEC) 10 MG tablet Take 10 mg by mouth daily as needed  for allergies.    CONTOUR NEXT TEST test strip USE TO CHECK BLOOD SUGAR TWICE DAILY   diclofenac Sodium (VOLTAREN) 1 % GEL APPLY 2 GRAMS(NICKEL SIZE AMOUNT) TOPICALLY TWICE DAILY AS NEEDED   docusate sodium (COLACE) 100 MG capsule Take 100 mg by mouth daily.   famotidine (PEPCID) 40 MG tablet Take 1 tablet (40 mg total) by mouth daily.   fluticasone (FLONASE) 50 MCG/ACT nasal spray SHAKE LIQUID AND USE 2 SPRAYS IN EACH NOSTRIL DAILY   ibuprofen (ADVIL,MOTRIN) 200 MG tablet Take 600 mg by mouth every 6 (six) hours as needed for pain.    insulin glargine (LANTUS SOLOSTAR) 100 UNIT/ML Solostar Pen INJECT 50 UNITS INTO THE SKIN EVERY MORNING AND 60 UNITS EVERY EVENING   levothyroxine (SYNTHROID) 112 MCG tablet Take 1 tablet (112 mcg total) by mouth daily before breakfast.   metFORMIN (GLUCOPHAGE) 500 MG tablet TAKE 2 TABLETS(1000 MG) BY MOUTH TWICE DAILY WITH A MEAL   metoprolol succinate (TOPROL-XL) 100 MG 24 hr tablet Take 1 tablet (100 mg total) by mouth daily. Take with or immediately following a meal   Probiotic Product (PROBIOTIC DAILY PO) Take by mouth.   rosuvastatin (CRESTOR) 40 MG tablet TAKE 1 TABLET(40 MG) BY MOUTH DAILY   vismodegib (ERIVEDGE) 150 MG  capsule Take by mouth.   [DISCONTINUED] Krill Oil 300 MG CAPS Take 1 capsule by mouth daily.   [DISCONTINUED] Misc Natural Products (OSTEO BI-FLEX TRIPLE STRENGTH PO) Take 2 tablets by mouth daily.   No facility-administered medications prior to visit.    ROS All review of systems negative except what is listed in the HPI        Objective:     BP 132/77   Pulse 67   Ht 6\' 5"  (1.956 m)   Wt 254 lb (115.2 kg)   SpO2 99%   BMI 30.12 kg/m    Physical Exam Vitals reviewed.  Constitutional:      General: He is not in acute distress.    Appearance: Normal appearance. He is not ill-appearing.  HENT:     Head: Normocephalic and atraumatic.     Right Ear: Tympanic membrane normal.     Left Ear: Tympanic membrane normal.      Nose: Nose normal.     Mouth/Throat:     Mouth: Mucous membranes are moist.     Pharynx: Oropharynx is clear.  Eyes:     Extraocular Movements: Extraocular movements intact.     Conjunctiva/sclera: Conjunctivae normal.     Pupils: Pupils are equal, round, and reactive to light.  Neck:     Vascular: No carotid bruit.  Cardiovascular:     Rate and Rhythm: Normal rate and regular rhythm.     Pulses: Normal pulses.     Heart sounds: Normal heart sounds.  Pulmonary:     Effort: Pulmonary effort is normal.     Breath sounds: Normal breath sounds.  Abdominal:     General: Abdomen is flat. Bowel sounds are normal. There is no distension.     Palpations: Abdomen is soft. There is no mass.     Tenderness: There is no abdominal tenderness. There is no right CVA tenderness, left CVA tenderness, guarding or rebound.  Genitourinary:    Comments: Deferred exam Musculoskeletal:        General: Normal range of motion.     Cervical back: Normal range of motion and neck supple. No tenderness.     Right lower leg: No edema.     Left lower leg: No edema.  Lymphadenopathy:     Cervical: No cervical adenopathy.  Skin:    General: Skin is warm and dry.     Capillary Refill: Capillary refill takes less than 2 seconds.  Neurological:     General: No focal deficit present.     Mental Status: He is alert and oriented to person, place, and time. Mental status is at baseline.  Psychiatric:        Mood and Affect: Mood normal.        Behavior: Behavior normal.        Thought Content: Thought content normal.        Judgment: Judgment normal.         No results found for any visits on 11/18/22.     Assessment & Plan:    Routine Health Maintenance and Physical Exam Discussed health promotion and safety including diet and exercise recommendations, dental health, and injury prevention. Tobacco cessation if applicable. Seat belts, sunscreen, smoke detectors, etc.    Immunization History   Administered Date(s) Administered   Influenza Split 01/28/2012   Influenza Whole 02/11/2010, 01/29/2011   Influenza,inj,Quad PF,6+ Mos 01/19/2013, 12/28/2013, 02/13/2015, 01/22/2016, 12/29/2016, 12/27/2018   Influenza-Unspecified 01/28/2018, 12/28/2021   Pneumococcal Conjugate-13 01/19/2013, 03/04/2015  Pneumococcal Polysaccharide-23 11/19/2015, 04/29/2022   Td 09/03/2021   Tdap 05/29/2010    Health Maintenance  Topic Date Due   COVID-19 Vaccine (1) Never done   Diabetic kidney evaluation - eGFR measurement  12/13/2021   HEMOGLOBIN A1C  10/28/2022   INFLUENZA VACCINE  10/29/2022   OPHTHALMOLOGY EXAM  11/18/2023 (Originally 12/11/2021)   Colonoscopy  11/18/2023 (Originally 03/18/2022)   Zoster Vaccines- Shingrix (1 of 2) 11/18/2023 (Originally 10/21/1975)   Diabetic kidney evaluation - Urine ACR  04/30/2023   FOOT EXAM  04/30/2023   Medicare Annual Wellness (AWV)  10/28/2023   DTaP/Tdap/Td (3 - Td or Tdap) 09/04/2031   Pneumonia Vaccine 7+ Years old  Completed   Hepatitis C Screening  Completed   HPV VACCINES  Aged Out        Problem List Items Addressed This Visit     Mixed hyperlipidemia    Medication management: Crestor Lifestyle factors for lowering cholesterol include: Diet therapy - heart-healthy diet rich in fruits, veggies, fiber-rich whole grains, lean meats, chicken, fish (at least twice a week), fat-free or 1% dairy products; foods low in saturated/trans fats, cholesterol, sodium, and sugar. Mediterranean diet has shown to be very heart healthy. Regular exercise - recommend at least 30 minutes a day, 5 times per week Weight management  Repeat lipid panel today       Relevant Orders   Lipid panel   Hypertension    Blood pressure is at goal for age and co-morbidities.   Recommendations: continue metoprolol and lifestyle measures - BP goal <130/80 - monitor and log blood pressures at home - check around the same time each day in a relaxed setting - Limit  salt to <2000 mg/day - Follow DASH eating plan (heart healthy diet) - limit alcohol to 2 standard drinks per day for men and 1 per day for women - avoid tobacco products - get at least 2 hours of regular aerobic exercise weekly Patient aware of signs/symptoms requiring further/urgent evaluation.        Relevant Orders   TSH   Type 2 diabetes mellitus with obesity (HCC)    Labs today Continue current regimen - adjust as needed pending labs Continue low-carb heart healthy diet and physical activity       Relevant Orders   HgB A1c   Microalbumin / creatinine urine ratio   Lipid panel   Hypothyroid    Continue Synthroid at current dose  Recheck TSH and adjust Synthroid as indicated       Relevant Orders   TSH   Other Visit Diagnoses     Encounter for long-term (current) use of insulin (HCC)    -  Primary   Relevant Orders   HgB A1c   Microalbumin / creatinine urine ratio     CBC/CMP stable last month. Defer repeat today.    Return in about 3 months (around 02/18/2023) for routine follow-up.     Clayborne Dana, NP

## 2022-11-18 NOTE — Assessment & Plan Note (Signed)
Medication management: Crestor Lifestyle factors for lowering cholesterol include: Diet therapy - heart-healthy diet rich in fruits, veggies, fiber-rich whole grains, lean meats, chicken, fish (at least twice a week), fat-free or 1% dairy products; foods low in saturated/trans fats, cholesterol, sodium, and sugar. Mediterranean diet has shown to be very heart healthy. Regular exercise - recommend at least 30 minutes a day, 5 times per week Weight management  Repeat lipid panel today

## 2022-11-18 NOTE — Assessment & Plan Note (Signed)
Blood pressure is at goal for age and co-morbidities.   Recommendations: continue metoprolol and lifestyle measures - BP goal <130/80 - monitor and log blood pressures at home - check around the same time each day in a relaxed setting - Limit salt to <2000 mg/day - Follow DASH eating plan (heart healthy diet) - limit alcohol to 2 standard drinks per day for men and 1 per day for women - avoid tobacco products - get at least 2 hours of regular aerobic exercise weekly Patient aware of signs/symptoms requiring further/urgent evaluation.

## 2022-11-18 NOTE — Assessment & Plan Note (Signed)
Labs today Continue current regimen - adjust as needed pending labs Continue low-carb heart healthy diet and physical activity

## 2022-11-20 LAB — TSH: TSH: 1.34 u[IU]/mL (ref 0.35–5.50)

## 2022-12-04 DIAGNOSIS — Z9289 Personal history of other medical treatment: Secondary | ICD-10-CM | POA: Diagnosis not present

## 2022-12-04 DIAGNOSIS — Z8616 Personal history of COVID-19: Secondary | ICD-10-CM | POA: Diagnosis not present

## 2022-12-04 DIAGNOSIS — C4441 Basal cell carcinoma of skin of scalp and neck: Secondary | ICD-10-CM | POA: Diagnosis not present

## 2022-12-04 DIAGNOSIS — C7802 Secondary malignant neoplasm of left lung: Secondary | ICD-10-CM | POA: Diagnosis not present

## 2022-12-04 DIAGNOSIS — C7800 Secondary malignant neoplasm of unspecified lung: Secondary | ICD-10-CM | POA: Diagnosis not present

## 2022-12-04 DIAGNOSIS — Z923 Personal history of irradiation: Secondary | ICD-10-CM | POA: Diagnosis not present

## 2022-12-11 ENCOUNTER — Other Ambulatory Visit: Payer: Self-pay | Admitting: Family Medicine

## 2022-12-12 ENCOUNTER — Other Ambulatory Visit: Payer: Self-pay | Admitting: Family Medicine

## 2022-12-14 ENCOUNTER — Other Ambulatory Visit: Payer: Self-pay | Admitting: Family Medicine

## 2023-01-05 ENCOUNTER — Telehealth: Payer: Self-pay | Admitting: Family Medicine

## 2023-01-05 ENCOUNTER — Other Ambulatory Visit: Payer: Self-pay

## 2023-01-05 MED ORDER — LANTUS SOLOSTAR 100 UNIT/ML ~~LOC~~ SOPN: 100.0000 [IU] | PEN_INJECTOR | Freq: Two times a day (BID) | SUBCUTANEOUS | 3 refills | Status: DC

## 2023-01-05 NOTE — Telephone Encounter (Signed)
Refill sent.

## 2023-01-05 NOTE — Telephone Encounter (Signed)
Prescription Request  01/05/2023  Is this a "Controlled Substance" medicine? No  LOV: Visit date not found  What is the name of the medication or equipment? insulin glargine (LANTUS SOLOSTAR) 100 UNIT/ML Solostar Pen\ **pt stated he needs 30-day supply**  Have you contacted your pharmacy to request a refill? No   Which pharmacy would you like this sent to?  [id number] - [place], [place] - [name] [place] [address] Phone: [phone] Fax: [phone]     Patient notified that their request is being sent to the clinical staff for review and that they should receive a response within 2 business days.   Please advise at [facility] [phone]

## 2023-01-08 ENCOUNTER — Other Ambulatory Visit: Payer: Self-pay | Admitting: Family Medicine

## 2023-01-15 DIAGNOSIS — C7802 Secondary malignant neoplasm of left lung: Secondary | ICD-10-CM | POA: Diagnosis not present

## 2023-01-15 DIAGNOSIS — C799 Secondary malignant neoplasm of unspecified site: Secondary | ICD-10-CM | POA: Diagnosis not present

## 2023-01-15 DIAGNOSIS — J90 Pleural effusion, not elsewhere classified: Secondary | ICD-10-CM | POA: Diagnosis not present

## 2023-01-15 DIAGNOSIS — C4441 Basal cell carcinoma of skin of scalp and neck: Secondary | ICD-10-CM | POA: Diagnosis not present

## 2023-01-15 DIAGNOSIS — K5900 Constipation, unspecified: Secondary | ICD-10-CM | POA: Diagnosis not present

## 2023-01-15 DIAGNOSIS — C7801 Secondary malignant neoplasm of right lung: Secondary | ICD-10-CM | POA: Diagnosis not present

## 2023-01-15 DIAGNOSIS — Z9289 Personal history of other medical treatment: Secondary | ICD-10-CM | POA: Diagnosis not present

## 2023-01-15 DIAGNOSIS — F17210 Nicotine dependence, cigarettes, uncomplicated: Secondary | ICD-10-CM | POA: Diagnosis not present

## 2023-01-15 DIAGNOSIS — S2242XD Multiple fractures of ribs, left side, subsequent encounter for fracture with routine healing: Secondary | ICD-10-CM | POA: Diagnosis not present

## 2023-01-15 DIAGNOSIS — S2242XK Multiple fractures of ribs, left side, subsequent encounter for fracture with nonunion: Secondary | ICD-10-CM | POA: Diagnosis not present

## 2023-01-15 DIAGNOSIS — Z923 Personal history of irradiation: Secondary | ICD-10-CM | POA: Diagnosis not present

## 2023-01-15 DIAGNOSIS — C7951 Secondary malignant neoplasm of bone: Secondary | ICD-10-CM | POA: Diagnosis not present

## 2023-01-15 DIAGNOSIS — Z7969 Long term (current) use of other immunomodulators and immunosuppressants: Secondary | ICD-10-CM | POA: Diagnosis not present

## 2023-01-18 ENCOUNTER — Telehealth: Payer: Self-pay | Admitting: Family Medicine

## 2023-01-18 NOTE — Telephone Encounter (Signed)
Heather from [employer] called to get clarification on the instructions for the lantus.   It says to: Inject 100 Units into the skin 2 (two) times daily. INJECT 50 UNITS INTO THE SKIN EVERY MORNING AND 60 UNITS EVERY EVENING   Please call [facility] at [phone] to advise or send in updated prescription.

## 2023-01-19 ENCOUNTER — Other Ambulatory Visit: Payer: Self-pay

## 2023-01-19 MED ORDER — LANTUS SOLOSTAR 100 UNIT/ML ~~LOC~~ SOPN: PEN_INJECTOR | SUBCUTANEOUS | 3 refills | Status: DC

## 2023-01-19 NOTE — Telephone Encounter (Signed)
Called pt to confirm dose  Lantus and he stated 50 units every morning,and  60 every evening.

## 2023-02-17 ENCOUNTER — Encounter: Payer: Self-pay | Admitting: Family Medicine

## 2023-02-17 ENCOUNTER — Other Ambulatory Visit (HOSPITAL_BASED_OUTPATIENT_CLINIC_OR_DEPARTMENT_OTHER): Payer: Self-pay

## 2023-02-17 ENCOUNTER — Ambulatory Visit (INDEPENDENT_AMBULATORY_CARE_PROVIDER_SITE_OTHER): Payer: Medicare HMO | Admitting: Family Medicine

## 2023-02-17 VITALS — BP 124/81 | HR 70 | Ht 77.0 in | Wt 260.0 lb

## 2023-02-17 DIAGNOSIS — Z23 Encounter for immunization: Secondary | ICD-10-CM | POA: Diagnosis not present

## 2023-02-17 DIAGNOSIS — E782 Mixed hyperlipidemia: Secondary | ICD-10-CM

## 2023-02-17 DIAGNOSIS — Z794 Long term (current) use of insulin: Secondary | ICD-10-CM | POA: Diagnosis not present

## 2023-02-17 DIAGNOSIS — E1169 Type 2 diabetes mellitus with other specified complication: Secondary | ICD-10-CM

## 2023-02-17 DIAGNOSIS — E669 Obesity, unspecified: Secondary | ICD-10-CM | POA: Diagnosis not present

## 2023-02-17 LAB — COMPREHENSIVE METABOLIC PANEL
ALT: 11 U/L (ref 0–53)
AST: 14 U/L (ref 0–37)
Albumin: 4 g/dL (ref 3.5–5.2)
Alkaline Phosphatase: 73 U/L (ref 39–117)
BUN: 10 mg/dL (ref 6–23)
CO2: 33 meq/L — ABNORMAL HIGH (ref 19–32)
Calcium: 9 mg/dL (ref 8.4–10.5)
Chloride: 95 meq/L — ABNORMAL LOW (ref 96–112)
Creatinine, Ser: 1.05 mg/dL (ref 0.40–1.50)
GFR: 74.06 mL/min (ref >60.00)
Glucose, Bld: 106 mg/dL — ABNORMAL HIGH (ref 70–99)
Potassium: 4.6 meq/L (ref 3.5–5.1)
Sodium: 134 meq/L — ABNORMAL LOW (ref 135–145)
Total Bilirubin: 0.7 mg/dL (ref 0.2–1.2)
Total Protein: 6.1 g/dL (ref 6.0–8.3)

## 2023-02-17 LAB — LIPID PANEL
Cholesterol: 119 mg/dL (ref 0–200)
HDL: 29.1 mg/dL — ABNORMAL LOW (ref >39.00)
LDL Cholesterol: 50 mg/dL (ref 0–99)
NonHDL: 89.98
Total CHOL/HDL Ratio: 4
Triglycerides: 199 mg/dL — ABNORMAL HIGH (ref 0.0–149.0)
VLDL: 39.8 mg/dL (ref 0.0–40.0)

## 2023-02-17 LAB — HEMOGLOBIN A1C: Hgb A1c MFr Bld: 7.4 % — ABNORMAL HIGH (ref 4.6–6.5)

## 2023-02-17 MED ORDER — LANTUS SOLOSTAR 100 UNIT/ML ~~LOC~~ SOPN: PEN_INJECTOR | SUBCUTANEOUS | 3 refills | Status: DC

## 2023-02-17 MED ORDER — COVID-19 MRNA VAC-TRIS(PFIZER) 30 MCG/0.3ML IM SUSY
0.3000 mL | PREFILLED_SYRINGE | Freq: Once | INTRAMUSCULAR | 0 refills | Status: AC
  Filled 2023-02-17: qty 0.3, 1d supply, fill #0

## 2023-02-17 NOTE — Progress Notes (Signed)
Established Patient Office Visit  Subjective   Patient ID: Steven Ibarra, male    DOB: 12-23-1955  Age: 67 y.o. MRN: [id number]  Chief Complaint  Patient presents with   Medical Management of Chronic Issues   Diabetes    Discussed the use of AI scribe software for clinical note transcription with the patient, who gave verbal consent to proceed.  History of Present Illness   The patient, with a history of diabetes and lung cancer, presents for a follow-up visit. They have been managing their diabetes with Lantus, taking 50 units in the morning and 60 units in the evening. They report good blood sugar control on this regimen. However, they express a desire to change their Lantus prescription from a 15-day supply to a 30-day supply for convenience.  In addition to diabetes, the patient is dealing with lung cancer. They report significant weight loss over the past year and have been advised to maintain their current weight. To do so, they sometimes consume sweets and other foods that can affect their blood sugar levels, necessitating their current Lantus doses.  The patient experiences nausea with most foods, limiting their dietary options. They consume one or two Ensure or Boost drinks daily to supplement their nutrition. They acknowledge the need to replace simple carbs with proteins and complex carbs to better manage their blood sugar levels.              ROS All review of systems negative except what is listed in the HPI    Objective:     BP 124/81   Pulse 70   Ht 6\' 5"  (1.956 m)   Wt 260 lb (117.9 kg)   SpO2 99%   BMI 30.83 kg/m    Physical Exam Vitals reviewed.  Constitutional:      General: He is not in acute distress.    Appearance: Normal appearance. He is not ill-appearing.  Cardiovascular:     Rate and Rhythm: Normal rate and regular rhythm.     Heart sounds: Normal heart sounds.  Pulmonary:     Effort: Pulmonary effort is normal.     Breath sounds:  Normal breath sounds.  Skin:    General: Skin is warm and dry.  Neurological:     Mental Status: He is alert and oriented to person, place, and time.  Psychiatric:        Mood and Affect: Mood normal.        Behavior: Behavior normal.        Thought Content: Thought content normal.        Judgment: Judgment normal.         No results found for any visits on 02/17/23.    The 10-year ASCVD risk score (Arnett DK, et al., 2019) is: 34.1%    Assessment & Plan:   Problem List Items Addressed This Visit       Active Problems   Mixed hyperlipidemia    Patient not currently on fish oil supplement. -Start fish oil supplement. -Continue rosuvastatin -Check lipid panel today.      Relevant Orders   Lipid panel   Type 2 diabetes mellitus with obesity (HCC) - Primary    Well controlled on Lantus 50 units in the morning and 60 units in the evening. Patient reports consuming simple carbohydrates to maintain weight due to lung cancer and associated nausea. -Attempt to increase Lantus prescription to a 30-day supply from current 15-day supply. -Encourage patient to consume more protein and  complex carbohydrates to avoid blood sugar spikes. -Check A1C today.      Relevant Medications   insulin glargine (LANTUS SOLOSTAR) 100 UNIT/ML Solostar Pen   Other Relevant Orders   HgB A1c   Comprehensive metabolic panel   Other Visit Diagnoses     Flu vaccine need       Relevant Orders   Flu Vaccine Trivalent High Dose (Fluad) (Completed)       Return in about 3 months (around 05/20/2023) for routine follow-up.    Steven Dana, NP

## 2023-02-17 NOTE — Assessment & Plan Note (Signed)
Well controlled on Lantus 50 units in the morning and 60 units in the evening. Patient reports consuming simple carbohydrates to maintain weight due to lung cancer and associated nausea. -Attempt to increase Lantus prescription to a 30-day supply from current 15-day supply. -Encourage patient to consume more protein and complex carbohydrates to avoid blood sugar spikes. -Check A1C today.

## 2023-02-17 NOTE — Assessment & Plan Note (Addendum)
Patient not currently on fish oil supplement. -Start fish oil supplement. -Continue rosuvastatin -Check lipid panel today.

## 2023-03-17 ENCOUNTER — Other Ambulatory Visit: Payer: Self-pay | Admitting: Family Medicine

## 2023-03-19 DIAGNOSIS — Z923 Personal history of irradiation: Secondary | ICD-10-CM | POA: Diagnosis not present

## 2023-03-19 DIAGNOSIS — C7800 Secondary malignant neoplasm of unspecified lung: Secondary | ICD-10-CM | POA: Diagnosis not present

## 2023-03-19 DIAGNOSIS — Z9289 Personal history of other medical treatment: Secondary | ICD-10-CM | POA: Diagnosis not present

## 2023-03-19 DIAGNOSIS — S2242XD Multiple fractures of ribs, left side, subsequent encounter for fracture with routine healing: Secondary | ICD-10-CM | POA: Diagnosis not present

## 2023-03-19 DIAGNOSIS — C4441 Basal cell carcinoma of skin of scalp and neck: Secondary | ICD-10-CM | POA: Diagnosis not present

## 2023-03-19 DIAGNOSIS — C782 Secondary malignant neoplasm of pleura: Secondary | ICD-10-CM | POA: Diagnosis not present

## 2023-03-22 ENCOUNTER — Other Ambulatory Visit: Payer: Self-pay | Admitting: Family Medicine

## 2023-03-29 ENCOUNTER — Telehealth: Payer: Self-pay | Admitting: *Deleted

## 2023-03-29 DIAGNOSIS — E669 Obesity, unspecified: Secondary | ICD-10-CM

## 2023-03-29 NOTE — Telephone Encounter (Addendum)
Prior auth started via phone to [facility].  They will fax questionnaire over.

## 2023-04-06 MED ORDER — ACCU-CHEK AVIVA PLUS VI STRP: ORAL_STRIP | 1 refills | Status: DC

## 2023-04-06 MED ORDER — ACCU-CHEK AVIVA PLUS W/DEVICE KIT: PACK | 0 refills | Status: AC

## 2023-04-06 MED ORDER — ACCU-CHEK SOFTCLIX LANCETS MISC: 1 refills | Status: DC

## 2023-04-06 NOTE — Telephone Encounter (Signed)
 We never received fax but pt agree to change to new meter, lancets, strips.

## 2023-04-06 NOTE — Addendum Note (Signed)
 Addended by: Thelma Barge D on: 04/06/2023 10:30 AM   Modules accepted: Orders

## 2023-05-07 DIAGNOSIS — C4441 Basal cell carcinoma of skin of scalp and neck: Secondary | ICD-10-CM | POA: Diagnosis not present

## 2023-05-07 DIAGNOSIS — C782 Secondary malignant neoplasm of pleura: Secondary | ICD-10-CM | POA: Diagnosis not present

## 2023-05-07 DIAGNOSIS — C7951 Secondary malignant neoplasm of bone: Secondary | ICD-10-CM | POA: Diagnosis not present

## 2023-05-07 DIAGNOSIS — C7802 Secondary malignant neoplasm of left lung: Secondary | ICD-10-CM | POA: Diagnosis not present

## 2023-05-07 DIAGNOSIS — C7800 Secondary malignant neoplasm of unspecified lung: Secondary | ICD-10-CM | POA: Diagnosis not present

## 2023-05-07 DIAGNOSIS — Z9289 Personal history of other medical treatment: Secondary | ICD-10-CM | POA: Diagnosis not present

## 2023-06-02 ENCOUNTER — Other Ambulatory Visit: Payer: Self-pay | Admitting: Family Medicine

## 2023-06-02 DIAGNOSIS — E1169 Type 2 diabetes mellitus with other specified complication: Secondary | ICD-10-CM

## 2023-06-05 ENCOUNTER — Other Ambulatory Visit: Payer: Self-pay | Admitting: Family Medicine

## 2023-06-05 DIAGNOSIS — E1169 Type 2 diabetes mellitus with other specified complication: Secondary | ICD-10-CM

## 2023-06-07 ENCOUNTER — Encounter: Payer: Self-pay | Admitting: *Deleted

## 2023-06-18 ENCOUNTER — Other Ambulatory Visit: Payer: Self-pay | Admitting: Family Medicine

## 2023-06-18 DIAGNOSIS — C7951 Secondary malignant neoplasm of bone: Secondary | ICD-10-CM | POA: Diagnosis not present

## 2023-06-18 DIAGNOSIS — Z923 Personal history of irradiation: Secondary | ICD-10-CM | POA: Diagnosis not present

## 2023-06-18 DIAGNOSIS — C7802 Secondary malignant neoplasm of left lung: Secondary | ICD-10-CM | POA: Diagnosis not present

## 2023-06-18 DIAGNOSIS — C7800 Secondary malignant neoplasm of unspecified lung: Secondary | ICD-10-CM | POA: Diagnosis not present

## 2023-06-18 DIAGNOSIS — C4441 Basal cell carcinoma of skin of scalp and neck: Secondary | ICD-10-CM | POA: Diagnosis not present

## 2023-06-18 DIAGNOSIS — Z9289 Personal history of other medical treatment: Secondary | ICD-10-CM | POA: Diagnosis not present

## 2023-06-18 NOTE — Telephone Encounter (Signed)
Left message on machine to call back to schedule follow up appointment. ?

## 2023-06-29 ENCOUNTER — Encounter: Payer: Self-pay | Admitting: *Deleted

## 2023-07-06 ENCOUNTER — Other Ambulatory Visit: Payer: Self-pay | Admitting: Family Medicine

## 2023-07-30 DIAGNOSIS — E039 Hypothyroidism, unspecified: Secondary | ICD-10-CM | POA: Diagnosis not present

## 2023-07-30 DIAGNOSIS — C7802 Secondary malignant neoplasm of left lung: Secondary | ICD-10-CM | POA: Diagnosis not present

## 2023-07-30 DIAGNOSIS — I10 Essential (primary) hypertension: Secondary | ICD-10-CM | POA: Diagnosis not present

## 2023-07-30 DIAGNOSIS — Z9289 Personal history of other medical treatment: Secondary | ICD-10-CM | POA: Diagnosis not present

## 2023-07-30 DIAGNOSIS — J90 Pleural effusion, not elsewhere classified: Secondary | ICD-10-CM | POA: Diagnosis not present

## 2023-07-30 DIAGNOSIS — C7951 Secondary malignant neoplasm of bone: Secondary | ICD-10-CM | POA: Diagnosis not present

## 2023-07-30 DIAGNOSIS — Z923 Personal history of irradiation: Secondary | ICD-10-CM | POA: Diagnosis not present

## 2023-07-30 DIAGNOSIS — R911 Solitary pulmonary nodule: Secondary | ICD-10-CM | POA: Diagnosis not present

## 2023-07-30 DIAGNOSIS — K8689 Other specified diseases of pancreas: Secondary | ICD-10-CM | POA: Diagnosis not present

## 2023-07-30 DIAGNOSIS — Z87891 Personal history of nicotine dependence: Secondary | ICD-10-CM | POA: Diagnosis not present

## 2023-07-30 DIAGNOSIS — C4441 Basal cell carcinoma of skin of scalp and neck: Secondary | ICD-10-CM | POA: Diagnosis not present

## 2023-07-30 DIAGNOSIS — E119 Type 2 diabetes mellitus without complications: Secondary | ICD-10-CM | POA: Diagnosis not present

## 2023-07-30 DIAGNOSIS — C770 Secondary and unspecified malignant neoplasm of lymph nodes of head, face and neck: Secondary | ICD-10-CM | POA: Diagnosis not present

## 2023-07-30 DIAGNOSIS — C7801 Secondary malignant neoplasm of right lung: Secondary | ICD-10-CM | POA: Diagnosis not present

## 2023-08-18 ENCOUNTER — Ambulatory Visit: Admitting: Pulmonary Disease

## 2023-08-18 ENCOUNTER — Encounter: Payer: Self-pay | Admitting: Pulmonary Disease

## 2023-08-18 VITALS — BP 137/79 | HR 69 | Ht 77.0 in | Wt 254.0 lb

## 2023-08-18 DIAGNOSIS — G4733 Obstructive sleep apnea (adult) (pediatric): Secondary | ICD-10-CM

## 2023-08-18 DIAGNOSIS — Z87891 Personal history of nicotine dependence: Secondary | ICD-10-CM | POA: Diagnosis not present

## 2023-08-18 NOTE — Patient Instructions (Signed)
 If you are able to find out what medical supply company to send a prescription to, kindly call us  to let us  know  We will on and try to figure out who we can send it to as well  We should be able to get them a prescription soonest  If your machine is not working as well, we also should be able to send a prescription to the medical supply company for an upgraded machine  Sometimes, insurance may require reconfirmation of your sleep apnea before a new machine is approved  Continue using your machine on a regular basis  Call us  with significant concerns  Follow-up in about 3 to 4 months  We will send a prescription to [facility] for CPAP supplies in the interim

## 2023-08-18 NOTE — Progress Notes (Signed)
 Steven Ibarra    [id number]    [birth year]  Primary Care Physician:Blyth, Bonita Bussing, MD  Referring Physician: Neda Balk, MD [address] [place],  [place] [address]  Chief complaint:     Patient with a history of obstructive sleep apnea on CPAP therapy Needs CPAP supplies  HPI:  Patient with obstructive sleep apnea Compliant with CPAP therapy Uses a CPAP nightly and benefits from its use Wakes up feeling like he has had a good nights rest  Diagnosed with obstructive sleep apnea about 2011  Last sleep study was in 2016 showing severe obstructive sleep apnea with AHI of 36.2  He has been using an auto titrating CPAP, uses it every night, benefits from its use He has not had any follow-up recently No download from his machine  Usually goes to bed about 11 to 11:30 PM, wakes up about 7:30 AM Does not feel unusually sleepy during the day  He currently is undergoing treatment for lung cancer, has had lung cancer for over 9 years Past history of basal cell carcinoma of the skin 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 Dx Code : E11.69   albuterol  (VENTOLIN  HFA) 108 (90 Base) MCG/ACT inhaler Inhale 1-2 puffs into the lungs every 4 (four) hours as needed for wheezing or shortness of breath.   ALPRAZolam  (XANAX ) 0.25 MG tablet TAKE 1 TABLET(0.25 MG) BY MOUTH TWICE DAILY AS NEEDED FOR ANXIETY   Ascorbic Acid  (VITAMIN C ) 1000 MG tablet Take 1,000 mg by mouth daily.   BD PEN NEEDLE NANO 2ND GEN 32G X 4 MM MISC USE WITH SEMGLEE  PEN TWICE DAILY   Blood Glucose Monitoring Suppl (ACCU-CHEK AVIVA PLUS) w/Device KIT Use to check blood sugar twice a  day.  Dx Code: E11.69   cetirizine (ZYRTEC) 10 MG tablet Take 10 mg by mouth daily as needed for allergies.    diclofenac Sodium (VOLTAREN) 1 % GEL APPLY 2 GRAMS(NICKEL SIZE AMOUNT) TOPICALLY TWICE DAILY AS NEEDED   docusate sodium (COLACE) 100 MG capsule Take 100 mg by mouth daily.   famotidine  (PEPCID ) 40 MG tablet Take 1 tablet (40 mg total) by mouth daily.   fluticasone  (FLONASE ) 50 MCG/ACT nasal spray SHAKE LIQUID AND USE 2 SPRAYS IN EACH NOSTRIL DAILY   glucose blood (ACCU-CHEK GUIDE TEST) test strip USE TO TEST BLOOD GLUCOSE TWICE DAILY   ibuprofen  (ADVIL ,MOTRIN ) 200 MG tablet Take 600 mg by mouth every 6 (six) hours as needed for pain.    insulin  glargine (LANTUS  SOLOSTAR) 100 UNIT/ML Solostar Pen INJECT 50 UNITS UNDER THE SKIN EVERY MORNING AND 60 UNITS EVERY PM/ APPT FOR FURTHER REFILLS   levothyroxine  (SYNTHROID ) 112 MCG tablet Take 1 tablet (112 mcg total) by mouth daily before breakfast.   metFORMIN  (GLUCOPHAGE ) 500 MG tablet TAKE 2 TABLETS(1000 MG) BY MOUTH TWICE DAILY WITH A MEAL   metoprolol  succinate (TOPROL -XL) 100 MG 24 hr tablet TAKE 1 TABLET(100 MG) BY MOUTH DAILY WITH OR IMMEDIATELY FOLLOWING A MEAL   rosuvastatin  (CRESTOR ) 40 MG tablet Take 1 tablet (40 mg total) by mouth daily.   vismodegib (ERIVEDGE) 150 MG  capsule Take by mouth.   No facility-administered encounter medications on file as of 08/18/2023.    Allergies as of 08/18/2023   (No Known Allergies)    Past Medical History:  Diagnosis Date   ALLERGIC RHINITIS, SEASONAL 10/23/2009   Anxiety 09/08/2016   Basal cell carcinoma of neck 05/28/2011   r suprahyoid, radical neck dissection S/p 6 weeks of targeted radiation therapy    Cancer (HCC)    CAP (community acquired pneumonia) 03/02/2016   Costochondritis 09/16/2015   Diabetes mellitus approx 2003   type 2   DIABETES MELLITUS, TYPE II 10/23/2009   DYSPNEA ON EXERTION 10/23/2009   ELEVATED BLOOD PRESSURE 04/23/2010   GERD (gastroesophageal reflux  disease)    Hearing loss    History of radiation therapy 07/06/2011- 08/19/2011   33 Fractions to Right facial nodes through the right neck and right trigeminal nerve   History of radiation therapy 05/23/14, 05/25/14, 05/28/14, 05/30/14, 06/01/14   SBRT Right upper lullng mass 54 Gy in 3 fractions, Left lower lung mass and adjacent nodules 50 Gy in 5 fractions   History of radiation therapy 02/11/2015- 02/20/15   Right lung superior upper lobe 50 Gy in 5 fractions, Left Lung posterior lower lobe 50 Gy in 5 fractions   Hyperlipidemia    Hypertension    Does not see a cardiologist, has not had a stress, echo    Hypothyroid 01/28/2012   Lung cancer (HCC) 03/08/2014   invasive squamous cell carcinoma   Mixed hyperlipidemia 10/23/2009   Obesity    OSA (obstructive sleep apnea)    Otitis externa of right ear 07/06/2012   Overweight(278.02) 10/23/2009   Pedal edema 09/28/2016   Preventative health care 09/30/2011   Pulled muscle 07/06/2012   Pulmonary hemorrhage 03/08/2014   Right calf pain 09/08/2016   S/P radiation therapy 07/06/11 - 08/19/11   Right Facial and Right Neck Nodes and right Trigeminal  Nerve to Base of Skull/ Total Dose 6600 cGy/ 33 Fractions   Sepsis due to undetermined organism (HCC) 04/28/2019   SIRS (systemic inflammatory response syndrome) (HCC) 04/24/2019   Skin cancer    on nose, 9-10 yrs ago   SLEEP APNEA, OBSTRUCTIVE 10/23/2009   Sleep study Done at [facility]   SOB (shortness of breath) 09/16/2015   Tinea pedis of both feet 03/02/2016   Type II or unspecified type diabetes mellitus with unspecified complication, uncontrolled    type 2     Past Surgical History:  Procedure Laterality Date   LUNG BIOPSY Right 03/08/2014   RADICAL NECK DISSECTION  05/28/2011   Procedure: RADICAL NECK DISSECTION;  Surgeon: Vernadine Golas, MD;  Location: [facility] OR;  Service: ENT;  Laterality: N/A;  Suprahyoid Neck Dissection   skin cancer removal     TONSILLECTOMY AND ADENOIDECTOMY      Family  History  Problem Relation Age of Onset   Other Mother        CHF   Stroke Father    Hyperlipidemia Father    Heart disease Father        s/p valve replacement   Hyperlipidemia Brother    Obesity Brother    Other Brother        Back pain   Hyperlipidemia Brother    Hypertension Brother    Other Brother        Panic attacks   Hyperlipidemia Brother    Anesthesia problems Neg Hx     Social History   Socioeconomic History   Marital status:  Married    Spouse name: Not on file   Number of children: Not on file   Years of education: Not on file   Highest education level: Associate degree: academic program  Occupational History   Not on file  Tobacco Use   Smoking status: Former    Current packs/day: 0.00    Average packs/day: 1 pack/day for 9.0 years (9.0 ttl pk-yrs)    Types: Cigarettes    Start date: 03/30/1966    Quit date: 03/31/1975    Years since quitting: 48.4   Smokeless tobacco: Never  Substance and Sexual Activity   Alcohol use: No    Alcohol/week: 0.0 standard drinks of alcohol    Comment: 4 beers a month, 06/12/11 rarely uses now   Drug use: No   Sexual activity: Yes  Other Topics Concern   Not on file  Social History Narrative   Patient is married.   Patient with a history of smoking one pack per day for approximately 29 years from ages of [age]. Patient denies ever having used smokeless tobacco. Patient with rare use of alcohol.      Mother died at age 68 from congestive heart failure complications. Father died at the age of 33 secondary to a stroke.   Social Drivers of Corporate investment banker Strain: Low Risk  (02/16/2023)   Overall Financial Resource Strain (CARDIA)    Difficulty of Paying Living Expenses: Not hard at all  Food Insecurity: No Food Insecurity (02/16/2023)   Hunger Vital Sign    Worried About Running Out of Food in the Last Year: Never true    Ran Out of Food in the Last Year: Never true  Transportation Needs: No Transportation  Needs (02/16/2023)   PRAPARE - Administrator, Civil Service (Medical): No    Lack of Transportation (Non-Medical): No  Physical Activity: Insufficiently Active (02/16/2023)   Exercise Vital Sign    Days of Exercise per Week: 1 day    Minutes of Exercise per Session: 10 min  Stress: No Stress Concern Present (02/16/2023)   [employer] of Occupational Health - Occupational Stress Questionnaire    Feeling of Stress : Not at all  Social Connections: Socially Integrated (02/16/2023)   Social Connection and Isolation Panel [NHANES]    Frequency of Communication with Friends and Family: More than three times a week    Frequency of Social Gatherings with Friends and Family: Twice a week    Attends Religious Services: More than 4 times per year    Active Member of [employer] or Organizations: Yes    Attends Engineer, structural: More than 4 times per year    Marital Status: Married  Catering manager Violence: Not At Risk (10/28/2022)   Humiliation, Afraid, Rape, and Kick questionnaire    Fear of Current or Ex-Partner: No    Emotionally Abused: No    Physically Abused: No    Sexually Abused: No    Review of Systems  Respiratory:  Positive for apnea and shortness of breath.   Psychiatric/Behavioral:  Positive for sleep disturbance.     Vitals:   08/18/23 0942  BP: 137/79  Pulse: 69  SpO2: 98%     Physical Exam Constitutional:      Appearance: He is obese.  HENT:     Head: Normocephalic.     Mouth/Throat:     Mouth: Mucous membranes are moist.  Eyes:     General: No scleral icterus. Cardiovascular:  Rate and Rhythm: Normal rate and regular rhythm.     Heart sounds: No murmur heard.    No friction rub.  Pulmonary:     Effort: No respiratory distress.     Breath sounds: No stridor. No wheezing or rhonchi.     Comments: Decreased air movement bilaterally Musculoskeletal:     Cervical back: No rigidity or tenderness.  Neurological:     Mental  Status: He is alert.  Psychiatric:        Mood and Affect: Mood normal.    Data Reviewed: Most recent CT scan of the chest and abdomen 07/30/2023-results reviewed in care everywhere  Last sleep study was in 2016 showing severe obstructive sleep apnea with AHI of 36.2  Assessment:  Severe obstructive sleep apnea  Excellent compliance with CPAP use  Continues to benefit from CPAP use Wakes up feeling like he is on a good nights rest  He is running out of supplies  Machine is dated and not able to get a download from the machine May need a new machine as well  Plan/Recommendations: Will provide a prescription for new CPAP supplies to go [facility] - Insurance change with his current insurance not partnering with [employer]  Encouraged to continue using his current CPAP  Call us  with significant concerns  When able, will provide a prescription for auto titrating CPAP 5-20 with heated humidification with patient's mask of choice  Tentative follow-up in about 3 months from here  Encouraged to call us  with significant concerns   Myer Artis MD [facility] 08/18/2023, 10:10 AM  CC: Neda Balk, MD    [facility]   NAME: ZAKHARI FOGEL DATE OF BIRTH:  April 11, 1955 MEDICAL RECORD NUMBER [id number]  LOCATION: [facility]  PHYSICIAN: Wilder Handy, M.D. DATE OF STUDY: 03/19/2014   SLEEP STUDY TYPE: Polysomnogram               REFERRING PHYSICIAN: Wilder Handy, MD   INDICATION FOR STUDY:  VICKEY EWBANK is a 68 y.o. male who presents to the sleep lab for evaluation of hypersomnia with obstructive sleep apnea.  He reports snoring, sleep disruption, apnea, and daytime sleepiness.   EPWORTH SLEEPINESS SCORE: 4. HEIGHT: 6\' 6"  (198.1 cm)  WEIGHT: (!) 306 lb (138.801 kg)    Body mass index is 35.37 kg/(m^2).  NECK SIZE:   in.     SLEEP ARCHITECTURE:  Total recording time: 408 minutes.  Total sleep  time was: 303 minutes.  Sleep efficiency: 74.4%.  Sleep latency: 19.5 minutes.  REM latency: 273.5 minutes.   Stage N1: 6.8%.  Stage N2: 81.7%.  Stage N3: 5.6%.  Stage R:  5.9%.   Supine sleep: 33 minutes.  Non-supine sleep: 270.5 minutes.   CARDIAC DATA:  Average heart rate: 85 beats per minute. Rhythm strip: sinus rhythm.   RESPIRATORY DATA: Average respiratory rate: 20. Snoring: loud. Average AHI: 36.2.   Apnea index: 11.5.  Hypopnea index: 24.7. Obstructive apnea index: 9.7.  Central apnea index: 0.  Mixed apnea index: 1.8. REM AHI: 40.  NREM AHI: 35.9. Supine AHI: 80. Non-supine AHI: 21.3.   MOVEMENT/PARASOMNIA:  Periodic limb movement: 0.  Period limb movements with arousals: 0. Restroom trips: 1.   OXYGEN DATA:  Baseline oxygenation: 98%. Lowest SaO2: 75%. Time spent below SaO2 90%: 56.4 minutes. Supplemental oxygen used: none.   IMPRESSION/ RECOMMENDATION:   This study showed severe obstructive sleep apnea with an AHI  of 36.2, and SaO2 low of 75%.    Additional therapies include weight loss, CPAP, oral appliance, or surgical evaluation.   Vineet Sood, M.D. Diplomate, Biomedical engineer of Sleep Medicine   ELECTRONICALLY SIGNED ON:  04/09/2014, 4:42 PM  [facility] PH: [phone]   FX: [phone] ACCREDITED BY THE AMERICAN ACADEMY OF SLEEP MEDICINE

## 2023-08-26 ENCOUNTER — Other Ambulatory Visit: Payer: Self-pay | Admitting: Family Medicine

## 2023-08-26 DIAGNOSIS — E1169 Type 2 diabetes mellitus with other specified complication: Secondary | ICD-10-CM

## 2023-09-07 ENCOUNTER — Other Ambulatory Visit: Payer: Self-pay | Admitting: Family Medicine

## 2023-09-07 DIAGNOSIS — E669 Obesity, unspecified: Secondary | ICD-10-CM

## 2023-09-10 DIAGNOSIS — C7802 Secondary malignant neoplasm of left lung: Secondary | ICD-10-CM | POA: Diagnosis not present

## 2023-09-10 DIAGNOSIS — E038 Other specified hypothyroidism: Secondary | ICD-10-CM | POA: Diagnosis not present

## 2023-09-10 DIAGNOSIS — Z9289 Personal history of other medical treatment: Secondary | ICD-10-CM | POA: Diagnosis not present

## 2023-09-10 DIAGNOSIS — C4441 Basal cell carcinoma of skin of scalp and neck: Secondary | ICD-10-CM | POA: Diagnosis not present

## 2023-09-10 DIAGNOSIS — C7951 Secondary malignant neoplasm of bone: Secondary | ICD-10-CM | POA: Diagnosis not present

## 2023-09-10 DIAGNOSIS — C7800 Secondary malignant neoplasm of unspecified lung: Secondary | ICD-10-CM | POA: Diagnosis not present

## 2023-09-10 DIAGNOSIS — Z923 Personal history of irradiation: Secondary | ICD-10-CM | POA: Diagnosis not present

## 2023-09-15 ENCOUNTER — Other Ambulatory Visit: Payer: Self-pay | Admitting: Family Medicine

## 2023-10-06 ENCOUNTER — Ambulatory Visit

## 2023-10-06 VITALS — BP 137/79 | Ht 77.0 in | Wt 253.0 lb

## 2023-10-06 DIAGNOSIS — Z0000 Encounter for general adult medical examination without abnormal findings: Secondary | ICD-10-CM | POA: Diagnosis not present

## 2023-10-06 NOTE — Progress Notes (Signed)
 Because this visit was a virtual/telehealth visit,  certain criteria was not obtained, such a blood pressure, CBG if applicable, and timed get up and go. Any medications not marked as taking were not mentioned during the medication reconciliation part of the visit. Any vitals not documented were not able to be obtained due to this being a telehealth visit or patient was unable to self-report a recent blood pressure reading due to a lack of equipment at home via telehealth. Vitals that have been documented are verbally provided by the patient.   This visit was performed by a medical professional under my direct supervision. I was immediately available for consultation/collaboration. I have reviewed and agree with the Annual Wellness Visit documentation.  Subjective:   Steven Ibarra is a 68 y.o. who presents for a Medicare Wellness preventive visit.  As a reminder, Annual Wellness Visits don't include a physical exam, and some assessments may be limited, especially if this visit is performed virtually. We may recommend an in-person follow-up visit with your provider if needed.  Visit Complete: Virtual I connected with  Steven Ibarra on 10/06/23 by a video and audio enabled telemedicine application and verified that I am speaking with the correct person using two identifiers.  Patient Location: Home  Provider Location: Home Office  I discussed the limitations of evaluation and management by telemedicine. The patient expressed understanding and agreed to proceed.  Vital Signs: Because this visit was a virtual/telehealth visit, some criteria may be missing or patient reported. Any vitals not documented were not able to be obtained and vitals that have been documented are patient reported.    Persons Participating in Visit: Patient.  AWV Questionnaire: No: Patient Medicare AWV questionnaire was not completed prior to this visit.  Cardiac Risk Factors include: advanced age (>86men, >30  women);male gender;diabetes mellitus;obesity (BMI >30kg/m2);hypertension;dyslipidemia     Objective:    Today's Vitals   10/06/23 1320  BP: 137/79  Weight: 253 lb (114.8 kg)  Height: 6' 5 (1.956 m)   Body mass index is 30 kg/m.     10/06/2023    1:24 PM 10/28/2022    1:05 PM 04/25/2019    4:37 AM 04/24/2019    3:57 PM 04/24/2016   11:32 AM 12/18/2015   10:53 AM 11/16/2015    9:16 PM  Advanced Directives  Does Patient Have a Medical Advance Directive? No No No No No  No  No   Would patient like information on creating a medical advance directive? No - Patient declined No - Patient declined No - Patient declined  No - Patient declined  Yes - Educational materials given       Data saved with a previous flowsheet row definition    Current Medications (verified) Outpatient Encounter Medications as of 10/06/2023  Medication Sig   Accu-Chek Softclix Lancets lancets USE TO CHECK BLOOD SUGAR TWICE DAILY   albuterol  (VENTOLIN  HFA) 108 (90 Base) MCG/ACT inhaler Inhale 1-2 puffs into the lungs every 4 (four) hours as needed for wheezing or shortness of breath.   ALPRAZolam  (XANAX ) 0.25 MG tablet TAKE 1 TABLET(0.25 MG) BY MOUTH TWICE DAILY AS NEEDED FOR ANXIETY   Ascorbic Acid  (VITAMIN C ) 1000 MG tablet Take 1,000 mg by mouth daily.   BD PEN NEEDLE NANO 2ND GEN 32G X 4 MM MISC USE WITH SEMGLEE  PEN TWICE DAILY   Blood Glucose Monitoring Suppl (ACCU-CHEK AVIVA PLUS) w/Device KIT Use to check blood sugar twice a day.  Dx Code: E11.69  cetirizine (ZYRTEC) 10 MG tablet Take 10 mg by mouth daily as needed for allergies.    diclofenac Sodium (VOLTAREN) 1 % GEL APPLY 2 GRAMS(NICKEL SIZE AMOUNT) TOPICALLY TWICE DAILY AS NEEDED   docusate sodium (COLACE) 100 MG capsule Take 100 mg by mouth daily.   famotidine  (PEPCID ) 40 MG tablet Take 1 tablet (40 mg total) by mouth daily.   fluticasone  (FLONASE ) 50 MCG/ACT nasal spray SHAKE LIQUID AND USE 2 SPRAYS IN EACH NOSTRIL DAILY   glucose blood (ACCU-CHEK GUIDE  TEST) test strip USE TO TEST BLOOD GLUCOSE TWICE DAILY   ibuprofen  (ADVIL ,MOTRIN ) 200 MG tablet Take 600 mg by mouth every 6 (six) hours as needed for pain.    insulin  glargine (LANTUS  SOLOSTAR) 100 UNIT/ML Solostar Pen INJECT 50 UNITS UNDER THE SKIN EVERY MORNING AND 60 UNITS EVERY PM/ APPT FOR FURTHER REFILLS   levothyroxine  (SYNTHROID ) 112 MCG tablet Take 1 tablet (112 mcg total) by mouth daily before breakfast.   metFORMIN  (GLUCOPHAGE ) 500 MG tablet Take 2 tablets (1,000 mg total) by mouth 2 (two) times daily with a meal. Needs appt   metoprolol  succinate (TOPROL -XL) 100 MG 24 hr tablet TAKE 1 TABLET(100 MG) BY MOUTH DAILY WITH OR IMMEDIATELY FOLLOWING A MEAL   rosuvastatin  (CRESTOR ) 40 MG tablet Take 1 tablet (40 mg total) by mouth daily.   vismodegib (ERIVEDGE) 150 MG capsule Take by mouth.   No facility-administered encounter medications on file as of 10/06/2023.    Allergies (verified) Patient has no known allergies.   History: Past Medical History:  Diagnosis Date   ALLERGIC RHINITIS, SEASONAL 10/23/2009   Anxiety 09/08/2016   Basal cell carcinoma of neck 05/28/2011   r suprahyoid, radical neck dissection S/p 6 weeks of targeted radiation therapy    Cancer (HCC)    CAP (community acquired pneumonia) 03/02/2016   Costochondritis 09/16/2015   Diabetes mellitus approx 2003   type 2   DIABETES MELLITUS, TYPE II 10/23/2009   DYSPNEA ON EXERTION 10/23/2009   ELEVATED BLOOD PRESSURE 04/23/2010   GERD (gastroesophageal reflux disease)    Hearing loss    History of radiation therapy 07/06/2011- 08/19/2011   33 Fractions to Right facial nodes through the right neck and right trigeminal nerve   History of radiation therapy 05/23/14, 05/25/14, 05/28/14, 05/30/14, 06/01/14   SBRT Right upper lullng mass 54 Gy in 3 fractions, Left lower lung mass and adjacent nodules 50 Gy in 5 fractions   History of radiation therapy 02/11/2015- 02/20/15   Right lung superior upper lobe 50 Gy in 5 fractions,  Left Lung posterior lower lobe 50 Gy in 5 fractions   Hyperlipidemia    Hypertension    Does not see a cardiologist, has not had a stress, echo    Hypothyroid 01/28/2012   Lung cancer (HCC) 03/08/2014   invasive squamous cell carcinoma   Mixed hyperlipidemia 10/23/2009   Obesity    OSA (obstructive sleep apnea)    Otitis externa of right ear 07/06/2012   Overweight(278.02) 10/23/2009   Pedal edema 09/28/2016   Preventative health care 09/30/2011   Pulled muscle 07/06/2012   Pulmonary hemorrhage 03/08/2014   Right calf pain 09/08/2016   S/P radiation therapy 07/06/11 - 08/19/11   Right Facial and Right Neck Nodes and right Trigeminal  Nerve to Base of Skull/ Total Dose 6600 cGy/ 33 Fractions   Sepsis due to undetermined organism (HCC) 04/28/2019   SIRS (systemic inflammatory response syndrome) (HCC) 04/24/2019   Skin cancer    on nose, 9-10  yrs ago   SLEEP APNEA, OBSTRUCTIVE 10/23/2009   Sleep study Done at [facility]   SOB (shortness of breath) 09/16/2015   Tinea pedis of both feet 03/02/2016   Type II or unspecified type diabetes mellitus with unspecified complication, uncontrolled    type 2    Past Surgical History:  Procedure Laterality Date   LUNG BIOPSY Right 03/08/2014   RADICAL NECK DISSECTION  05/28/2011   Procedure: RADICAL NECK DISSECTION;  Surgeon: Norleen Notice, MD;  Location: [facility] OR;  Service: ENT;  Laterality: N/A;  Suprahyoid Neck Dissection   skin cancer removal     TONSILLECTOMY AND ADENOIDECTOMY     Family History  Problem Relation Age of Onset   Other Mother        CHF   Stroke Father    Hyperlipidemia Father    Heart disease Father        s/p valve replacement   Hyperlipidemia Brother    Obesity Brother    Other Brother        Back pain   Hyperlipidemia Brother    Hypertension Brother    Other Brother        Panic attacks   Hyperlipidemia Brother    Anesthesia problems Neg Hx    Social History   Socioeconomic History   Marital status: Married     Spouse name: Not on file   Number of children: Not on file   Years of education: Not on file   Highest education level: Associate degree: academic program  Occupational History   Not on file  Tobacco Use   Smoking status: Former    Current packs/day: 0.00    Average packs/day: 1 pack/day for 9.0 years (9.0 ttl pk-yrs)    Types: Cigarettes    Start date: 03/30/1966    Quit date: 03/31/1975    Years since quitting: 48.5   Smokeless tobacco: Never  Substance and Sexual Activity   Alcohol use: No    Alcohol/week: 0.0 standard drinks of alcohol    Comment: 4 beers a month, 06/12/11 rarely uses now   Drug use: No   Sexual activity: Yes  Other Topics Concern   Not on file  Social History Narrative   Patient is married.   Patient with a history of smoking one pack per day for approximately 29 years from ages of [age]. Patient denies ever having used smokeless tobacco. Patient with rare use of alcohol.      Mother died at age 83 from congestive heart failure complications. Father died at the age of 26 secondary to a stroke.   Social Drivers of Corporate investment banker Strain: Low Risk  (10/06/2023)   Overall Financial Resource Strain (CARDIA)    Difficulty of Paying Living Expenses: Not hard at all  Food Insecurity: No Food Insecurity (10/06/2023)   Hunger Vital Sign    Worried About Running Out of Food in the Last Year: Never true    Ran Out of Food in the Last Year: Never true  Transportation Needs: No Transportation Needs (10/06/2023)   PRAPARE - Administrator, Civil Service (Medical): No    Lack of Transportation (Non-Medical): No  Physical Activity: Insufficiently Active (10/06/2023)   Exercise Vital Sign    Days of Exercise per Week: 2 days    Minutes of Exercise per Session: 20 min  Stress: No Stress Concern Present (10/06/2023)   [employer] of Occupational Health - Occupational Stress Questionnaire    Feeling  of Stress: Not at all  Social Connections: Socially  Integrated (10/06/2023)   Social Connection and Isolation Panel    Frequency of Communication with Friends and Family: Twice a week    Frequency of Social Gatherings with Friends and Family: Twice a week    Attends Religious Services: More than 4 times per year    Active Member of [employer] or Organizations: Yes    Attends Engineer, structural: More than 4 times per year    Marital Status: Married    Tobacco Counseling Counseling given: Not Answered    Clinical Intake:  Pre-visit preparation completed: Yes  Pain : No/denies pain     BMI - recorded: 30 Nutritional Status: BMI > 30  Obese Nutritional Risks: None Diabetes: Yes CBG done?: No Did pt. bring in CBG monitor from home?: No  Lab Results  Component Value Date   HGBA1C 7.4 (H) 02/17/2023   HGBA1C 7.6 (H) 11/18/2022   HGBA1C 7.8 (H) 04/29/2022     How often do you need to have someone help you when you read instructions, pamphlets, or other written materials from your doctor or pharmacy?: 1 - Never What is the last grade level you completed in school?: assoc degree  Interpreter Needed?: No  Information entered by :: Markevious Ehmke,cma   Activities of Daily Living     10/06/2023    9:30 AM 10/28/2022   12:43 PM  In your present state of health, do you have any difficulty performing the following activities:  Hearing? 0 0  Vision? 0 0  Difficulty concentrating or making decisions? 0 0  Walking or climbing stairs? 0 0  Dressing or bathing? 0 0  Doing errands, shopping? 0 0  Preparing Food and eating ? N N  Using the Toilet? N N  In the past six months, have you accidently leaked urine? N N  Do you have problems with loss of bowel control? N N  Managing your Medications? N N  Managing your Finances? N N  Housekeeping or managing your Housekeeping? N N    Patient Care Team: Domenica Harlene LABOR, MD as PCP - General CRAIG WOOD, OD (Optometry)  I have updated your Care Teams any recent Medical Services  you may have received from other providers in the past year.     Assessment:   This is a routine wellness examination for [place].  Hearing/Vision screen Hearing Screening - Comments:: No difficulties Vision Screening - Comments:: Patient wears glasses   Goals Addressed             This Visit's Progress    Patient Stated       To continue working        Depression Screen     10/06/2023    1:24 PM 11/18/2022   10:06 AM 10/28/2022    1:05 PM 04/29/2022    9:59 AM 09/03/2021    9:58 AM 04/24/2016   11:32 AM 12/18/2015   10:54 AM  PHQ 2/9 Scores  PHQ - 2 Score 0 0 0 0 0 0 0  PHQ- 9 Score 0          Fall Risk     10/06/2023    9:30 AM 08/18/2023    9:38 AM 11/18/2022   10:06 AM 10/28/2022   12:43 PM 04/29/2022    9:59 AM  Fall Risk   Falls in the past year? 0 0 1 0 0  Number falls in past yr: 0  0 0  0  Injury with Fall? 0  0 0 0  Risk for fall due to : No Fall Risks  History of fall(s) No Fall Risks No Fall Risks  Follow up Falls evaluation completed  Falls evaluation completed Falls evaluation completed Falls evaluation completed    MEDICARE RISK AT HOME:  Medicare Risk at Home Any stairs in or around the home?: (Patient-Rptd) Yes If so, are there any without handrails?: (Patient-Rptd) No Home free of loose throw rugs in walkways, pet beds, electrical cords, etc?: (Patient-Rptd) Yes Adequate lighting in your home to reduce risk of falls?: (Patient-Rptd) Yes Life alert?: (Patient-Rptd) No Use of a cane, walker or w/c?: (Patient-Rptd) No Grab bars in the bathroom?: (Patient-Rptd) Yes Shower chair or bench in shower?: (Patient-Rptd) Yes Elevated toilet seat or a handicapped toilet?: (Patient-Rptd) No  TIMED UP AND GO:  Was the test performed?  No  Cognitive Function: 6CIT completed        10/06/2023    1:22 PM 10/28/2022    1:08 PM  6CIT Screen  What Year? 0 points 0 points  What month? 0 points 0 points  What time? 0 points 0 points  Count back from 20 0 points  0 points  Months in reverse 0 points 0 points  Repeat phrase 0 points 4 points  Total Score 0 points 4 points    Immunizations Immunization History  Administered Date(s) Administered   Fluad Trivalent(High Dose 65+) 02/17/2023   Influenza Split 01/28/2012   Influenza Whole 02/11/2010, 01/29/2011   Influenza,inj,Quad PF,6+ Mos 01/19/2013, 12/28/2013, 02/13/2015, 01/22/2016, 12/29/2016, 12/27/2018   Influenza-Unspecified 01/28/2018, 12/28/2021   Pfizer(Comirnaty )Fall Seasonal Vaccine 12 years and older 02/17/2023   Pneumococcal Conjugate-13 01/19/2013, 03/04/2015   Pneumococcal Polysaccharide-23 11/19/2015, 04/29/2022   Td 09/03/2021   Tdap 05/29/2010    Screening Tests Health Maintenance  Topic Date Due   Diabetic kidney evaluation - Urine ACR  Never done   COVID-19 Vaccine (2 - Pfizer risk series) 03/10/2023   FOOT EXAM  04/30/2023   HEMOGLOBIN A1C  08/17/2023   OPHTHALMOLOGY EXAM  11/18/2023 (Originally 12/11/2021)   Colonoscopy  11/18/2023 (Originally 03/18/2022)   Zoster Vaccines- Shingrix (1 of 2) 11/18/2023 (Originally 10/21/1975)   INFLUENZA VACCINE  10/29/2023   Diabetic kidney evaluation - eGFR measurement  02/17/2024   Medicare Annual Wellness (AWV)  10/05/2024   DTaP/Tdap/Td (3 - Td or Tdap) 09/04/2031   Pneumococcal Vaccine: 50+ Years  Completed   Hepatitis C Screening  Completed   Hepatitis B Vaccines  Aged Out   HPV VACCINES  Aged Out   Meningococcal B Vaccine  Aged Out    Health Maintenance  Health Maintenance Due  Topic Date Due   Diabetic kidney evaluation - Urine ACR  Never done   COVID-19 Vaccine (2 - Pfizer risk series) 03/10/2023   FOOT EXAM  04/30/2023   HEMOGLOBIN A1C  08/17/2023   Health Maintenance Items Addressed:   Additional Screening:  Vision Screening: Recommended annual ophthalmology exams for early detection of glaucoma and other disorders of the eye. Would you like a referral to an eye doctor? No    Dental Screening: Recommended  annual dental exams for proper oral hygiene  Community Resource Referral / Chronic Care Management: CRR required this visit?  No   CCM required this visit?  No   Plan:    I have personally reviewed and noted the following in the patient's chart:   Medical and social history Use of alcohol, tobacco or illicit drugs  Current  medications and supplements including opioid prescriptions. Patient is not currently taking opioid prescriptions. Functional ability and status Nutritional status Physical activity Advanced directives List of other physicians Hospitalizations, surgeries, and ER visits in previous 12 months Vitals Screenings to include cognitive, depression, and falls Referrals and appointments  In addition, I have reviewed and discussed with patient certain preventive protocols, quality metrics, and best practice recommendations. A written personalized care plan for preventive services as well as general preventive health recommendations were provided to patient.   Lyle MARLA Right, [place]   10/06/2023   After Visit Summary: (MyChart) Due to this being a telephonic visit, the after visit summary with patients personalized plan was offered to patient via MyChart   Notes: Nothing significant to report at this time.

## 2023-10-06 NOTE — Patient Instructions (Signed)
 Steven Ibarra , Thank you for taking time out of your busy schedule to complete your Annual Wellness Visit with me. I enjoyed our conversation and look forward to speaking with you again next year. I, as well as your care team,  appreciate your ongoing commitment to your health goals. Please review the following plan we discussed and let me know if I can assist you in the future. Your Game plan/ To Do List    Referrals: If you haven't heard from the office you've been referred to, please reach out to them at the phone provided.  none Follow up Visits: Next Medicare AWV with our clinical staff: 10/11/2024   Have you seen your provider in the last 6 months (3 months if uncontrolled diabetes)? No Next Office Visit with your provider: n/a  Clinician Recommendations:  Aim for 30 minutes of exercise or brisk walking, 6-8 glasses of water, and 5 servings of fruits and vegetables each day.       This is a list of the screening recommended for you and due dates:  Health Maintenance  Topic Date Due   Yearly kidney health urinalysis for diabetes  Never done   COVID-19 Vaccine (2 - Pfizer risk series) 03/10/2023   Complete foot exam   04/30/2023   Hemoglobin A1C  08/17/2023   Eye exam for diabetics  11/18/2023*   Colon Cancer Screening  11/18/2023*   Zoster (Shingles) Vaccine (1 of 2) 11/18/2023*   Flu Shot  10/29/2023   Yearly kidney function blood test for diabetes  02/17/2024   Medicare Annual Wellness Visit  10/05/2024   DTaP/Tdap/Td vaccine (3 - Td or Tdap) 09/04/2031   Pneumococcal Vaccine for age over 67  Completed   Hepatitis C Screening  Completed   Hepatitis B Vaccine  Aged Out   HPV Vaccine  Aged Out   Meningitis B Vaccine  Aged Out  *Topic was postponed. The date shown is not the original due date.    Advanced directives: (Declined) Advance directive discussed with you today. Even though you declined this today, please call our office should you change your mind, and we can give you  the proper paperwork for you to fill out. Advance Care Planning is important because it:  [x]  Makes sure you receive the medical care that is consistent with your values, goals, and preferences  [x]  It provides guidance to your family and loved ones and reduces their decisional burden about whether or not they are making the right decisions based on your wishes.  Follow the link provided in your after visit summary or read over the paperwork we have mailed to you to help you started getting your Advance Directives in place. If you need assistance in completing these, please reach out to us  so that we can help you!  See attachments for Preventive Care and Fall Prevention Tips.

## 2023-10-19 ENCOUNTER — Other Ambulatory Visit: Payer: Self-pay | Admitting: Family Medicine

## 2023-10-19 DIAGNOSIS — E1169 Type 2 diabetes mellitus with other specified complication: Secondary | ICD-10-CM

## 2023-10-20 ENCOUNTER — Other Ambulatory Visit: Payer: Self-pay | Admitting: Family Medicine

## 2023-10-20 DIAGNOSIS — E1169 Type 2 diabetes mellitus with other specified complication: Secondary | ICD-10-CM

## 2023-10-20 MED ORDER — LANTUS SOLOSTAR 100 UNIT/ML ~~LOC~~ SOPN: PEN_INJECTOR | SUBCUTANEOUS | 0 refills | Status: DC

## 2023-10-20 NOTE — Telephone Encounter (Signed)
 Copied from CRM [id number]. Topic: Clinical - Medication Refill >> Oct 20, 2023  9:20 AM Burnard DEL wrote: Medication: insulin  glargine (LANTUS  SOLOSTAR) 100 UNIT/ML Solostar Pen  Has the patient contacted their pharmacy? No (Agent: If no, request that the patient contact the pharmacy for the refill. If patient does not wish to contact the pharmacy document the reason why and proceed with request.) (Agent: If yes, when and what did the pharmacy advise?)  This is the patient's preferred pharmacy:  [id number] - [place], [place] - [address] AT SEC [address] [place] [address] Phone: [phone] Fax: [phone]   Is this the correct pharmacy for this prescription? Yes If no, delete pharmacy and type the correct one.   Has the prescription been filled recently? No  Is the patient out of the medication? no  Has the patient been seen for an appointment in the last year OR does the patient have an upcoming appointment? Yes  Can we respond through MyChart? Yes  Agent: Please be advised that Rx refills may take up to 3 business days. We ask that you follow-up with your pharmacy.

## 2023-10-21 ENCOUNTER — Other Ambulatory Visit: Payer: Self-pay | Admitting: Family Medicine

## 2023-10-21 NOTE — Telephone Encounter (Signed)
 Fill too soon just filled on 10/12/23

## 2023-10-22 DIAGNOSIS — C7802 Secondary malignant neoplasm of left lung: Secondary | ICD-10-CM | POA: Diagnosis not present

## 2023-10-22 DIAGNOSIS — C4441 Basal cell carcinoma of skin of scalp and neck: Secondary | ICD-10-CM | POA: Diagnosis not present

## 2023-10-22 DIAGNOSIS — Z923 Personal history of irradiation: Secondary | ICD-10-CM | POA: Diagnosis not present

## 2023-10-22 DIAGNOSIS — Z9289 Personal history of other medical treatment: Secondary | ICD-10-CM | POA: Diagnosis not present

## 2023-10-22 DIAGNOSIS — R197 Diarrhea, unspecified: Secondary | ICD-10-CM | POA: Diagnosis not present

## 2023-10-22 DIAGNOSIS — C7951 Secondary malignant neoplasm of bone: Secondary | ICD-10-CM | POA: Diagnosis not present

## 2023-10-22 DIAGNOSIS — C7800 Secondary malignant neoplasm of unspecified lung: Secondary | ICD-10-CM | POA: Diagnosis not present

## 2023-10-22 DIAGNOSIS — C770 Secondary and unspecified malignant neoplasm of lymph nodes of head, face and neck: Secondary | ICD-10-CM | POA: Diagnosis not present

## 2023-10-26 DIAGNOSIS — I872 Venous insufficiency (chronic) (peripheral): Secondary | ICD-10-CM | POA: Insufficient documentation

## 2023-10-27 ENCOUNTER — Ambulatory Visit: Payer: Self-pay | Admitting: Family Medicine

## 2023-10-27 ENCOUNTER — Ambulatory Visit (INDEPENDENT_AMBULATORY_CARE_PROVIDER_SITE_OTHER): Admitting: Family Medicine

## 2023-10-27 ENCOUNTER — Encounter: Payer: Self-pay | Admitting: Family Medicine

## 2023-10-27 VITALS — BP 120/74 | HR 68 | Ht 77.0 in | Wt 249.0 lb

## 2023-10-27 DIAGNOSIS — Z7984 Long term (current) use of oral hypoglycemic drugs: Secondary | ICD-10-CM

## 2023-10-27 DIAGNOSIS — E669 Obesity, unspecified: Secondary | ICD-10-CM | POA: Diagnosis not present

## 2023-10-27 DIAGNOSIS — I10 Essential (primary) hypertension: Secondary | ICD-10-CM

## 2023-10-27 DIAGNOSIS — E1169 Type 2 diabetes mellitus with other specified complication: Secondary | ICD-10-CM | POA: Diagnosis not present

## 2023-10-27 DIAGNOSIS — E039 Hypothyroidism, unspecified: Secondary | ICD-10-CM

## 2023-10-27 DIAGNOSIS — F419 Anxiety disorder, unspecified: Secondary | ICD-10-CM

## 2023-10-27 DIAGNOSIS — E782 Mixed hyperlipidemia: Secondary | ICD-10-CM | POA: Diagnosis not present

## 2023-10-27 DIAGNOSIS — E871 Hypo-osmolality and hyponatremia: Secondary | ICD-10-CM

## 2023-10-27 LAB — MICROALBUMIN / CREATININE URINE RATIO
Creatinine,U: 69.2 mg/dL
Microalb Creat Ratio: UNDETERMINED mg/g (ref 0.0–30.0)
Microalb, Ur: <0.7 mg/dL

## 2023-10-27 LAB — LIPID PANEL
Cholesterol: 119 mg/dL (ref 0–200)
HDL: 32.8 mg/dL — ABNORMAL LOW (ref >39.00)
LDL Cholesterol: 54 mg/dL (ref 0–99)
NonHDL: 85.76
Total CHOL/HDL Ratio: 4
Triglycerides: 158 mg/dL — ABNORMAL HIGH (ref 0.0–149.0)
VLDL: 31.6 mg/dL (ref 0.0–40.0)

## 2023-10-27 LAB — BASIC METABOLIC PANEL WITH GFR
BUN: 12 mg/dL (ref 6–23)
CO2: 32 meq/L (ref 19–32)
Calcium: 9.3 mg/dL (ref 8.4–10.5)
Chloride: 96 meq/L (ref 96–112)
Creatinine, Ser: 1.08 mg/dL (ref 0.40–1.50)
GFR: 71.26 mL/min (ref >60.00)
Glucose, Bld: 102 mg/dL — ABNORMAL HIGH (ref 70–99)
Potassium: 4.6 meq/L (ref 3.5–5.1)
Sodium: 136 meq/L (ref 135–145)

## 2023-10-27 LAB — HEMOGLOBIN A1C: Hgb A1c MFr Bld: 7.4 % — ABNORMAL HIGH (ref 4.6–6.5)

## 2023-10-27 NOTE — Progress Notes (Signed)
 Established Patient Office Visit  Subjective   Patient ID: Steven Ibarra, male    DOB: [birth year]  Age: 68 y.o. MRN: [id number]  Chief Complaint  Patient presents with   Medical Management of Chronic Issues    HPI    Discussed the use of AI scribe software for clinical note transcription with the patient, who gave verbal consent to proceed.  History of Present Illness Steven Ibarra is a 68 year old male presents for a regular follow-up and blood work.  He is here for a regular follow-up and blood work, specifically to check his A1c and microalbumin. His last A1c was 7.4 in November of the previous year. He is currently taking Lantus , 50 units in the morning and 60 units in the evening, although he sometimes adjusts the evening dose slightly. He also takes metformin  1000 mg twice a day.  He experiences occasional nausea related to his medications, which affects his diet. He sometimes needs to take nausea medication and finds it challenging to maintain a healthy diet, often needing to 'force' himself to eat to get enough calories.  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 He experiences some swelling in his feet and has reduced his use of compression socks, especially when wearing shorts.  He is on several other medications including rosuvastatin  40 mg daily for cholesterol, metoprolol  100 mg daily for HTN, Synthroid  112 mcg daily, Pepcid  and Colace PRN. He takes Xanax  (alprazolam ) infrequently, about once every week or two, as needed.  No need for medication refills at this time.     Lab Results  Component Value Date   HGBA1C 7.4 (H) 02/17/2023        ROS All review of systems negative except what is listed in the HPI    Objective:     BP 120/74   Pulse 68   Ht 6' 5 (1.956 m)   Wt 249 lb (112.9 kg)   SpO2 97%   BMI 29.53 kg/m     Physical Exam Vitals reviewed.  Constitutional:      General: He is not in acute distress.    Appearance: Normal appearance. He is not ill-appearing.  Cardiovascular:     Rate and Rhythm: Normal rate and regular rhythm.     Heart sounds: Normal heart sounds.  Pulmonary:     Effort: Pulmonary effort is normal.     Breath sounds: Normal breath sounds.  Musculoskeletal:     Comments: Trace pitting edema bilateral lower extremities   Skin:    General: Skin is warm and dry.  Neurological:     Mental Status: He is alert and oriented to person, place, and time.  Psychiatric:        Mood and Affect: Mood normal.        Behavior: Behavior normal.        Thought Content: Thought content normal.        Judgment: Judgment normal.      No results found for any visits on 10/27/23.    The ASCVD Risk score (Arnett DK, et al., 2019) failed to calculate for the following reasons:   The valid total cholesterol range is 130 to 320 mg/dL    Assessment & Plan:   Problem List Items Addressed This Visit       Active Problems   Mixed hyperlipidemia   Medication management:  rosuvastatin  40 mg daily Lifestyle factors for lowering cholesterol include: Diet therapy - heart-healthy diet rich in fruits, veggies, fiber-rich whole grains, lean meats, chicken, fish (at least twice a week), fat-free or 1% dairy products; foods low in saturated/trans fats, cholesterol, sodium, and sugar. Mediterranean diet has shown to be very heart healthy. Regular exercise - recommend at least 30 minutes a day, 5 times per week Weight management         Relevant Orders   Lipid panel   Hypertension   Blood pressure is at goal for age and co-morbidities.   Recommendations: continue metoprolol  and lifestyle measures - BP goal <130/80 - monitor and log blood pressures at home - check around the same time each day in a relaxed setting - Limit salt to <2000 mg/day - Follow DASH eating plan (heart healthy  diet) - limit alcohol to 2 standard drinks per day for men and 1 per day for women - avoid tobacco products - get at least 2 hours of regular aerobic exercise weekly Patient aware of signs/symptoms requiring further/urgent evaluation. Trace pitting edema - recommend compression socks        Relevant Orders   Basic Metabolic Panel (BMET)   Type 2 diabetes mellitus with obesity (HCC) - Primary   Type 2 diabetes mellitus with previous A1c of 7.4%. On Lantus  and metformin  with some self-titration. - Order labs today. - Encourage healthy dietary choices.      Relevant Orders   Microalbumin / creatinine urine ratio   Hemoglobin A1c   Basic Metabolic Panel (BMET)   Hypothyroid   Managed with Synthroid  112 mcg daily. Recent thyroid  function tests well-managed.      Anxiety   Stable. Rarely needs Xanax .      Other Visit Diagnoses       Hyponatremia       Relevant Orders   Basic Metabolic Panel (BMET)          Return in about 3 months (around 01/27/2024) for chronic disease management.    Waddell KATHEE Mon, NP

## 2023-10-27 NOTE — Assessment & Plan Note (Signed)
 Stable. Rarely needs Xanax .

## 2023-10-27 NOTE — Assessment & Plan Note (Signed)
Medication management: rosuvastatin 40 mg daily  Lifestyle factors for lowering cholesterol include: Diet therapy - heart-healthy diet rich in fruits, veggies, fiber-rich whole grains, lean meats, chicken, fish (at least twice a week), fat-free or 1% dairy products; foods low in saturated/trans fats, cholesterol, sodium, and sugar. Mediterranean diet has shown to be very heart healthy. Regular exercise - recommend at least 30 minutes a day, 5 times per week Weight management

## 2023-10-27 NOTE — Assessment & Plan Note (Signed)
 Managed with Synthroid  112 mcg daily. Recent thyroid  function tests well-managed.

## 2023-10-27 NOTE — Assessment & Plan Note (Signed)
 Type 2 diabetes mellitus with previous A1c of 7.4%. On Lantus  and metformin  with some self-titration. - Order labs today. - Encourage healthy dietary choices.

## 2023-10-27 NOTE — Assessment & Plan Note (Signed)
Blood pressure is at goal for age and co-morbidities.   Recommendations: continue metoprolol and lifestyle measures - BP goal <130/80 - monitor and log blood pressures at home - check around the same time each day in a relaxed setting - Limit salt to <2000 mg/day - Follow DASH eating plan (heart healthy diet) - limit alcohol to 2 standard drinks per day for men and 1 per day for women - avoid tobacco products - get at least 2 hours of regular aerobic exercise weekly Patient aware of signs/symptoms requiring further/urgent evaluation.

## 2023-10-29 ENCOUNTER — Telehealth (HOSPITAL_BASED_OUTPATIENT_CLINIC_OR_DEPARTMENT_OTHER): Payer: Self-pay

## 2023-10-29 NOTE — Telephone Encounter (Signed)
 Spoke to [facility]. Informed that patient has been referred to [facility] for Hyperbaric Oxygen Therapy. Physicians require radiology confirming osteoradionecrosis prior to scheduling with wound care. Message being sent to nurses.

## 2023-11-09 ENCOUNTER — Encounter (HOSPITAL_BASED_OUTPATIENT_CLINIC_OR_DEPARTMENT_OTHER): Payer: Self-pay | Admitting: Oral Surgery

## 2023-11-10 ENCOUNTER — Encounter (HOSPITAL_BASED_OUTPATIENT_CLINIC_OR_DEPARTMENT_OTHER): Payer: Self-pay | Admitting: Oral Surgery

## 2023-11-10 ENCOUNTER — Other Ambulatory Visit (HOSPITAL_COMMUNITY): Payer: Self-pay | Admitting: Oral Surgery

## 2023-11-10 DIAGNOSIS — Z923 Personal history of irradiation: Secondary | ICD-10-CM

## 2023-11-10 DIAGNOSIS — Z01818 Encounter for other preprocedural examination: Secondary | ICD-10-CM

## 2023-11-10 DIAGNOSIS — C4441 Basal cell carcinoma of skin of scalp and neck: Secondary | ICD-10-CM

## 2023-11-24 ENCOUNTER — Encounter: Admitting: Family Medicine

## 2023-11-24 ENCOUNTER — Ambulatory Visit: Admitting: Pulmonary Disease

## 2023-11-24 DIAGNOSIS — Z0000 Encounter for general adult medical examination without abnormal findings: Secondary | ICD-10-CM

## 2023-12-01 ENCOUNTER — Ambulatory Visit (HOSPITAL_COMMUNITY)
Admission: RE | Admit: 2023-12-01 | Discharge: 2023-12-01 | Disposition: A | Discharge status: Home / Self Care | Source: Ambulatory Visit | Attending: Oral Surgery | Admitting: Oral Surgery

## 2023-12-01 DIAGNOSIS — C4441 Basal cell carcinoma of skin of scalp and neck: Secondary | ICD-10-CM | POA: Diagnosis not present

## 2023-12-01 DIAGNOSIS — R9089 Other abnormal findings on diagnostic imaging of central nervous system: Secondary | ICD-10-CM | POA: Diagnosis not present

## 2023-12-01 DIAGNOSIS — Z923 Personal history of irradiation: Secondary | ICD-10-CM | POA: Diagnosis not present

## 2023-12-01 DIAGNOSIS — Z01818 Encounter for other preprocedural examination: Secondary | ICD-10-CM | POA: Insufficient documentation

## 2023-12-01 DIAGNOSIS — G9389 Other specified disorders of brain: Secondary | ICD-10-CM | POA: Diagnosis not present

## 2023-12-01 DIAGNOSIS — I6523 Occlusion and stenosis of bilateral carotid arteries: Secondary | ICD-10-CM | POA: Diagnosis not present

## 2023-12-04 ENCOUNTER — Other Ambulatory Visit: Payer: Self-pay | Admitting: Family Medicine

## 2023-12-24 DIAGNOSIS — E871 Hypo-osmolality and hyponatremia: Secondary | ICD-10-CM | POA: Diagnosis not present

## 2023-12-24 DIAGNOSIS — C4441 Basal cell carcinoma of skin of scalp and neck: Secondary | ICD-10-CM | POA: Diagnosis not present

## 2023-12-24 DIAGNOSIS — C7802 Secondary malignant neoplasm of left lung: Secondary | ICD-10-CM | POA: Diagnosis not present

## 2024-01-06 ENCOUNTER — Other Ambulatory Visit: Payer: Self-pay | Admitting: Family Medicine

## 2024-01-06 DIAGNOSIS — E669 Obesity, unspecified: Secondary | ICD-10-CM

## 2024-01-14 ENCOUNTER — Telehealth: Payer: Self-pay

## 2024-01-14 NOTE — Telephone Encounter (Signed)
 Copied from CRM [id number]. Topic: Clinical - Medication Question >> Jan 14, 2024  2:54 PM Suzen RAMAN wrote: Reason for CRM: Patient was advised by the pharmacy that he would need another appointment prior to requesting any 90 day supply of metFORMIN  (GLUCOPHAGE ) 500 MG tablet medication. Patient was seen in July by Waddell Mon and wanted to know if that visit would suffice.

## 2024-01-17 ENCOUNTER — Other Ambulatory Visit: Payer: Self-pay | Admitting: Family Medicine

## 2024-01-17 ENCOUNTER — Encounter: Payer: Self-pay | Admitting: *Deleted

## 2024-01-25 ENCOUNTER — Other Ambulatory Visit: Payer: Self-pay | Admitting: Family Medicine

## 2024-01-27 ENCOUNTER — Telehealth: Payer: Self-pay | Admitting: Family Medicine

## 2024-01-27 NOTE — Telephone Encounter (Unsigned)
 Copied from CRM #[id number]. Topic: Clinical - Medication Refill >> Jan 27, 2024  3:24 PM Macario HERO wrote: Medication: levothyroxine  (SYNTHROID ) 112 MCG tablet [[id number]]  Has the patient contacted their pharmacy? Yes (Agent: If no, request that the patient contact the pharmacy for the refill. If patient does not wish to contact the pharmacy document the reason why and proceed with request.) (Agent: If yes, when and what did the pharmacy advise?)  This is the patient's preferred pharmacy:  [id number] - [place], [place] - [address] SEC [address] [place] [address] Phone: [phone] Fax: [phone]   Is this the correct pharmacy for this prescription? Yes If no, delete pharmacy and type the correct one.   Has the prescription been filled recently? Yes  Is the patient out of the medication? Yes  Has the patient been seen for an appointment in the last year OR does the patient have an upcoming appointment? Yes  Can we respond through MyChart? Yes  Agent: Please be advised that Rx refills may take up to 3 business days. We ask that you follow-up with your pharmacy.

## 2024-01-28 MED ORDER — LEVOTHYROXINE SODIUM 112 MCG PO TABS: 112.0000 ug | ORAL_TABLET | Freq: Every day | ORAL | 0 refills | Status: DC

## 2024-01-28 NOTE — Telephone Encounter (Signed)
 Thersia can you reach out to Pt- he is overdue for appt. Needs to see Domenica or Harlene Jolly. Thank you.

## 2024-01-28 NOTE — Telephone Encounter (Signed)
 Pt scheduled for next Wednesday with CLORA

## 2024-02-01 NOTE — Assessment & Plan Note (Signed)
 Follows with oncology

## 2024-02-01 NOTE — Assessment & Plan Note (Signed)
 Stable on levothyroxine .  Continue to monitor.

## 2024-02-01 NOTE — Assessment & Plan Note (Signed)
 Tolerating statin. Encourage heart healthy diet such as MIND or DASH diet, increase exercise, avoid trans fats, simple carbohydrates and processed foods, consider a krill or fish or flaxseed oil cap daily.

## 2024-02-01 NOTE — Assessment & Plan Note (Signed)
 Stable on current medications.  Denies SI/HI.  Uses alprazolam  sparingly.

## 2024-02-01 NOTE — Progress Notes (Unsigned)
 Subjective:     Patient ID: Steven Ibarra, male    DOB: [birth year], 68 y.o.   MRN: [id number]  No chief complaint on file.   HPI  Discussed the use of AI scribe software for clinical note transcription with the patient, who gave verbal consent to proceed.  History of Present Illness       History of Present Illness Steven Ibarra is a 68 year old male with type 2 diabetes and stage four lung cancer who presents for a follow-up visit.  He manages type 2 diabetes with metformin  and Lantus  insulin . Morning blood sugar levels have been slightly elevated at 116-117 mg/dL, though previously below 100 mg/dL. He administers approximately 50 units of Lantus  insulin  twice daily, adjusting based on sugar levels and diet. No hypoglycemic episodes have occurred.  He has stage four lung cancer, treated with a daily chemotherapy pill for three to four years. He experiences occasional fatigue but maintains part-time work delivering auto parts. A CT scan is scheduled for November 14th.  He takes levothyroxine  112 mcg daily for hypothyroidism. Rosuvastatin  and metoprolol  are used for cholesterol and blood pressure management. Blood pressure is generally well-controlled, though elevated today due to delayed medication intake. No palpitations or anxiety are present. He smoked in high school but has not smoked for a long time.   Follows with oncology, pulmonology,  PMHx-OSA., SCC lung,   Diabetes: - Checking glucose at home: yes -Home BS: 110-120 fasting AM - Medications: Lantus  50 units AM, 50 units PM,  Metformin  1000 mg twice daily - Compliant with medications - Denies symptoms of hypoglycemia, polyuria, polydipsia, numbness extremities, foot ulcers/trauma, visual changes, wounds that are not healing, medication side effects   HTN-metoprolol  100 mg daily  HLD-rosuvastatin  40 mg daily  Anxiety-alprazolam  as needed  GERD-famotidine  40 mg daily  Hypothyroid-levothyroxine  112 mcg  daily  Taking medication as prescribed, denies adverse side effects.  Patient denies fever, chills, SOB, CP, palpitations, dyspnea, edema, HA, vision changes, N/V/D, abdominal pain, urinary symptoms, rash, weight changes, and recent illness or hospitalizations.    Health Maintenance Due  Topic Date Due   Zoster Vaccines- Shingrix (1 of 2) Never done   OPHTHALMOLOGY EXAM  12/11/2021   Colonoscopy  03/18/2022   FOOT EXAM  04/30/2023   COVID-19 Vaccine (7 - 2025-26 season) 11/29/2023    Past Medical History:  Diagnosis Date   ALLERGIC RHINITIS, SEASONAL 10/23/2009   Anxiety 09/08/2016   Basal cell carcinoma of neck 05/28/2011   r suprahyoid, radical neck dissection S/p 6 weeks of targeted radiation therapy    Cancer (HCC)    CAP (community acquired pneumonia) 03/02/2016   Costochondritis 09/16/2015   Diabetes mellitus approx 2003   type 2   DIABETES MELLITUS, TYPE II 10/23/2009   DYSPNEA ON EXERTION 10/23/2009   ELEVATED BLOOD PRESSURE 04/23/2010   GERD (gastroesophageal reflux disease)    Hearing loss    History of radiation therapy 07/06/2011- 08/19/2011   33 Fractions to Right facial nodes through the right neck and right trigeminal nerve   History of radiation therapy 05/23/14, 05/25/14, 05/28/14, 05/30/14, 06/01/14   SBRT Right upper lullng mass 54 Gy in 3 fractions, Left lower lung mass and adjacent nodules 50 Gy in 5 fractions   History of radiation therapy 02/11/2015- 02/20/15   Right lung superior upper lobe 50 Gy in 5 fractions, Left Lung posterior lower lobe 50 Gy in 5 fractions   Hyperlipidemia    Hypertension    Does  not see a cardiologist, has not had a stress, echo    Hypothyroid 01/28/2012   Lung cancer (HCC) 03/08/2014   invasive squamous cell carcinoma   Mixed hyperlipidemia 10/23/2009   Obesity    OSA (obstructive sleep apnea)    Otitis externa of right ear 07/06/2012   Overweight(278.02) 10/23/2009   Pedal edema 09/28/2016   Preventative health care 09/30/2011    Pulled muscle 07/06/2012   Pulmonary hemorrhage 03/08/2014   Right calf pain 09/08/2016   S/P radiation therapy 07/06/11 - 08/19/11   Right Facial and Right Neck Nodes and right Trigeminal  Nerve to Base of Skull/ Total Dose 6600 cGy/ 33 Fractions   Sepsis due to undetermined organism (HCC) 04/28/2019   SIRS (systemic inflammatory response syndrome) (HCC) 04/24/2019   Skin cancer    on nose, 9-10 yrs ago   SLEEP APNEA, OBSTRUCTIVE 10/23/2009   Sleep study Done at [facility]   SOB (shortness of breath) 09/16/2015   Tinea pedis of both feet 03/02/2016   Type II or unspecified type diabetes mellitus with unspecified complication, uncontrolled    type 2     Past Surgical History:  Procedure Laterality Date   LUNG BIOPSY Right 03/08/2014   RADICAL NECK DISSECTION  05/28/2011   Procedure: RADICAL NECK DISSECTION;  Surgeon: Norleen Notice, MD;  Location: [facility] OR;  Service: ENT;  Laterality: N/A;  Suprahyoid Neck Dissection   skin cancer removal     TONSILLECTOMY AND ADENOIDECTOMY      Family History  Problem Relation Age of Onset   Other Mother        CHF   Stroke Father    Hyperlipidemia Father    Heart disease Father        s/p valve replacement   Hyperlipidemia Brother    Obesity Brother    Other Brother        Back pain   Hyperlipidemia Brother    Hypertension Brother    Other Brother        Panic attacks   Hyperlipidemia Brother    Anesthesia problems Neg Hx     Social History   Socioeconomic History   Marital status: Married    Spouse name: Not on file   Number of children: Not on file   Years of education: Not on file   Highest education level: Associate degree: academic program  Occupational History   Not on file  Tobacco Use   Smoking status: Former    Current packs/day: 0.00    Average packs/day: 1 pack/day for 9.0 years (9.0 ttl pk-yrs)    Types: Cigarettes    Start date: 03/30/1966    Quit date: 03/31/1975    Years since quitting: 48.8   Smokeless tobacco:  Never  Substance and Sexual Activity   Alcohol use: No    Alcohol/week: 0.0 standard drinks of alcohol    Comment: 4 beers a month, 06/12/11 rarely uses now   Drug use: No   Sexual activity: Yes  Other Topics Concern   Not on file  Social History Narrative   Patient is married.   Patient with a history of smoking one pack per day for approximately 29 years from ages of [age]. Patient denies ever having used smokeless tobacco. Patient with rare use of alcohol.      Mother died at age 40 from congestive heart failure complications. Father died at the age of 81 secondary to a stroke.   Social Drivers of Corporate Investment Banker  Strain: Low Risk  (02/02/2024)   Overall Financial Resource Strain (CARDIA)    Difficulty of Paying Living Expenses: Not hard at all  Food Insecurity: No Food Insecurity (02/02/2024)   Hunger Vital Sign    Worried About Running Out of Food in the Last Year: Never true    Ran Out of Food in the Last Year: Never true  Transportation Needs: No Transportation Needs (02/02/2024)   PRAPARE - Administrator, Civil Service (Medical): No    Lack of Transportation (Non-Medical): No  Physical Activity: Insufficiently Active (02/02/2024)   Exercise Vital Sign    Days of Exercise per Week: 1 day    Minutes of Exercise per Session: 20 min  Stress: No Stress Concern Present (02/02/2024)   Steven Ibarra    Feeling of Stress: Not at all  Social Connections: Socially Integrated (02/02/2024)   Social Connection and Isolation Panel    Frequency of Communication with Friends and Family: Three times a week    Frequency of Social Gatherings with Friends and Family: Twice a week    Attends Religious Services: More than 4 times per year    Active Member of [employer] or Organizations: Yes    Attends Engineer, Structural: More than 4 times per year    Marital Status: Married  Catering Manager Violence: Not  At Risk (10/06/2023)   Humiliation, Afraid, Rape, and Kick Ibarra    Fear of Current or Ex-Partner: No    Emotionally Abused: No    Physically Abused: No    Sexually Abused: No    Outpatient Medications Prior to Visit  Medication Sig Dispense Refill   Accu-Chek Softclix Lancets lancets USE TO CHECK BLOOD SUGAR TWICE DAILY 100 each 0   albuterol  (VENTOLIN  HFA) 108 (90 Base) MCG/ACT inhaler Inhale 1-2 puffs into the lungs every 4 (four) hours as needed for wheezing or shortness of breath. 25.5 g 3   ALPRAZolam  (XANAX ) 0.25 MG tablet TAKE 1 TABLET(0.25 MG) BY MOUTH TWICE DAILY AS NEEDED FOR ANXIETY 60 tablet 1   Ascorbic Acid  (VITAMIN C ) 1000 MG tablet Take 1,000 mg by mouth daily.     Blood Glucose Monitoring Suppl (ACCU-CHEK AVIVA PLUS) w/Device KIT Use to check blood sugar twice a day.  Dx Code: E11.69 1 kit 0   cetirizine (ZYRTEC) 10 MG tablet Take 10 mg by mouth daily as needed for allergies.      diclofenac Sodium (VOLTAREN) 1 % GEL APPLY 2 GRAMS(NICKEL SIZE AMOUNT) TOPICALLY TWICE DAILY AS NEEDED 100 g 1   docusate sodium (COLACE) 100 MG capsule Take 100 mg by mouth daily.     famotidine  (PEPCID ) 40 MG tablet Take 1 tablet (40 mg total) by mouth daily. 90 tablet 0   fluticasone  (FLONASE ) 50 MCG/ACT nasal spray SHAKE LIQUID AND USE 2 SPRAYS IN EACH NOSTRIL DAILY 16 g 6   glucose blood (ACCU-CHEK GUIDE TEST) test strip USE TO TEST BLOOD GLUCOSE TWICE DAILY 200 strip 12   ibuprofen  (ADVIL ,MOTRIN ) 200 MG tablet Take 600 mg by mouth every 6 (six) hours as needed for pain.      insulin  glargine (LANTUS  SOLOSTAR) 100 UNIT/ML Solostar Pen INJECT 50 UNITS UNDER THE SKIN EVERY MORNING AND 60 UNITS EVERY EVENING 30 mL 0   levothyroxine  (SYNTHROID ) 112 MCG tablet Take 1 tablet (112 mcg total) by mouth daily before breakfast. NEEDS APPT 30 tablet 0   metoprolol  succinate (TOPROL -XL) 100 MG 24 hr tablet  Take 1 tablet (100 mg total) by mouth daily. Take with or immediately following a meal 90  tablet 0   ondansetron  (ZOFRAN -ODT) 8 MG disintegrating tablet Take 8 mg by mouth 3 (three) times daily.     rosuvastatin  (CRESTOR ) 40 MG tablet TAKE 1 TABLET(40 MG) BY MOUTH DAILY 90 tablet 0   vismodegib (ERIVEDGE) 150 MG capsule Take by mouth.     BD PEN NEEDLE NANO 2ND GEN 32G X 4 MM MISC USE WITH SEMGLEE  PEN TWICE DAILY 200 each 1   metFORMIN  (GLUCOPHAGE ) 500 MG tablet Take 2 tablets (1,000 mg total) by mouth 2 (two) times daily with a meal. Needs appt 120 tablet 0   No facility-administered medications prior to visit.    No Known Allergies  ROS    See HPI Objective:    Physical Exam Vitals reviewed.  Constitutional:      General: He is not in acute distress.    Appearance: He is not toxic-appearing.  HENT:     Head: Normocephalic and atraumatic.     Mouth/Throat:     Mouth: Mucous membranes are moist.     Pharynx: Oropharynx is clear.  Eyes:     Extraocular Movements: Extraocular movements intact.     Pupils: Pupils are equal, round, and reactive to light.  Cardiovascular:     Rate and Rhythm: Normal rate and regular rhythm.     Pulses: Normal pulses.     Heart sounds: Normal heart sounds. No murmur heard. Pulmonary:     Effort: Pulmonary effort is normal. No respiratory distress.     Breath sounds: Normal breath sounds. No wheezing.  Musculoskeletal:        General: No swelling.     Cervical back: Neck supple.  Skin:    General: Skin is warm and dry.  Neurological:     General: No focal deficit present.     Mental Status: He is alert and oriented to person, place, and time.  Psychiatric:        Mood and Affect: Mood normal.        Behavior: Behavior normal.        Thought Content: Thought content normal.        Judgment: Judgment normal.      BP 134/87   Pulse 69   Ht 6' 5 (1.956 m)   Wt 251 lb (113.9 kg)   SpO2 97%   BMI 29.76 kg/m  Wt Readings from Last 3 Encounters:  02/02/24 251 lb (113.9 kg)  10/27/23 249 lb (112.9 kg)  10/06/23 253 lb  (114.8 kg)       Assessment & Plan:   Problem List Items Addressed This Visit     Anxiety   Stable on current medications.  Denies SI/HI.  Uses alprazolam  sparingly.      Basal cell carcinoma of neck   Follows with oncology      Hypertension   Well controlled, no changes to meds. Encouraged heart healthy diet such as the DASH diet and exercise as tolerated.        Relevant Orders   CBC with Differential/Platelet   Hypothyroid   Stable on levothyroxine .  Continue to monitor.      Relevant Orders   TSH   T4, free   T3, free   Malignant neoplasm metastatic to lung [facility])   Followed at [facility] by oncology  On oral chemotherapy. Occasional fatigue but remains active. -Follows up with oncology every six weeks. - Upcoming CT  scan planned, CT q 6 mo      Mixed hyperlipidemia   Tolerating statin.Encourage heart healthy diet such as MIND or DASH diet, increase exercise, avoid trans fats, simple carbohydrates and processed foods, consider a krill or fish or flaxseed oil cap daily.        Relevant Orders   Comp Met (CMET)   Type 2 diabetes mellitus in patient with obesity (HCC) - Primary   hgba1c acceptable, minimize simple carbs. Increase exercise as tolerated. Continue current meds       Relevant Medications   metFORMIN  (GLUCOPHAGE ) 500 MG tablet   Other Relevant Orders   HgB A1c   Other Visit Diagnoses       Need for influenza vaccination       Relevant Orders   Flu vaccine HIGH DOSE PF(Fluzone Trivalent) (Completed)     Screening for malignant neoplasm of prostate       Relevant Orders   PSA      Hypothyroidism Recent TSH borderline, indicating possible over-replacement.  Current levothyroxine  dose is 112 mcg daily. - Ordered repeat TSH, T3, and T4 levels.  General Health Maintenance Discussion of vaccinations including shingles and COVID-19. Plans to receive COVID-19 vaccine soon. - Recommended shingles vaccine at pharmacy. - Plan for COVID-19 vaccination  at pharmacy.   I have changed Carlin DRAFTS Thoman's BD Pen Needle Nano 2nd Gen. I am also having him maintain his cetirizine, ibuprofen , vitamin C , albuterol , vismodegib, docusate sodium, famotidine , diclofenac Sodium, ALPRAZolam , fluticasone , Accu-Chek Aviva Plus, Accu-Chek Guide Test, Accu-Chek Softclix Lancets, ondansetron , metoprolol  succinate, Lantus  SoloStar, rosuvastatin , levothyroxine , and metFORMIN .  Meds ordered this encounter  Medications   metFORMIN  (GLUCOPHAGE ) 500 MG tablet    Sig: Take 2 tablets (1,000 mg total) by mouth 2 (two) times daily with a meal. Needs appt    Dispense:  360 tablet    Refill:  1    ZERO refills remain on this prescription. Your patient is requesting advance approval of refills for this medication to PREVENT ANY MISSED DOSES    Supervising Provider:   DOMENICA BLACKBIRD A [[id number]]   Insulin  Pen Needle (BD PEN NEEDLE NANO 2ND GEN) 32G X 4 MM MISC    Sig: Inject 50 Units into the skin 2 (two) times daily. USE WITH SEMGLEE  PEN TWICE DAILY.    Dispense:  200 each    Refill:  1    Supervising Provider:   DOMENICA BLACKBIRD A [[id number]]

## 2024-02-01 NOTE — Assessment & Plan Note (Signed)
 hgba1c acceptable, minimize simple carbs. Increase exercise as tolerated. Continue current meds

## 2024-02-01 NOTE — Assessment & Plan Note (Signed)
 Well controlled, no changes to meds. Encouraged heart healthy diet such as the DASH diet and exercise as tolerated.

## 2024-02-02 ENCOUNTER — Ambulatory Visit (INDEPENDENT_AMBULATORY_CARE_PROVIDER_SITE_OTHER): Admitting: Student

## 2024-02-02 VITALS — BP 134/87 | HR 69 | Ht 77.0 in | Wt 251.0 lb

## 2024-02-02 DIAGNOSIS — E669 Obesity, unspecified: Secondary | ICD-10-CM

## 2024-02-02 DIAGNOSIS — Z23 Encounter for immunization: Secondary | ICD-10-CM

## 2024-02-02 DIAGNOSIS — I10 Essential (primary) hypertension: Secondary | ICD-10-CM

## 2024-02-02 DIAGNOSIS — C4441 Basal cell carcinoma of skin of scalp and neck: Secondary | ICD-10-CM

## 2024-02-02 DIAGNOSIS — Z125 Encounter for screening for malignant neoplasm of prostate: Secondary | ICD-10-CM

## 2024-02-02 DIAGNOSIS — Z860100 Personal history of colon polyps, unspecified: Secondary | ICD-10-CM

## 2024-02-02 DIAGNOSIS — E782 Mixed hyperlipidemia: Secondary | ICD-10-CM | POA: Diagnosis not present

## 2024-02-02 DIAGNOSIS — C7802 Secondary malignant neoplasm of left lung: Secondary | ICD-10-CM

## 2024-02-02 DIAGNOSIS — E039 Hypothyroidism, unspecified: Secondary | ICD-10-CM | POA: Diagnosis not present

## 2024-02-02 DIAGNOSIS — E119 Type 2 diabetes mellitus without complications: Secondary | ICD-10-CM | POA: Diagnosis not present

## 2024-02-02 DIAGNOSIS — Z7984 Long term (current) use of oral hypoglycemic drugs: Secondary | ICD-10-CM

## 2024-02-02 DIAGNOSIS — F419 Anxiety disorder, unspecified: Secondary | ICD-10-CM | POA: Diagnosis not present

## 2024-02-02 MED ORDER — BD PEN NEEDLE NANO 2ND GEN 32G X 4 MM MISC: 50.0000 [IU] | Freq: Two times a day (BID) | 1 refills | Status: AC

## 2024-02-02 MED ORDER — METFORMIN HCL 500 MG PO TABS: 1000.0000 mg | ORAL_TABLET | Freq: Two times a day (BID) | ORAL | 1 refills | Status: AC

## 2024-02-02 NOTE — Assessment & Plan Note (Addendum)
 Followed at [facility] by oncology  On oral chemotherapy. Occasional fatigue but remains active. -Follows up with oncology every six weeks. - Upcoming CT scan planned, CT q 6 mo

## 2024-02-03 ENCOUNTER — Ambulatory Visit: Payer: Self-pay | Admitting: Student

## 2024-02-03 DIAGNOSIS — E039 Hypothyroidism, unspecified: Secondary | ICD-10-CM

## 2024-02-03 LAB — CBC WITH DIFFERENTIAL/PLATELET
Basophils Absolute: 0.1 K/uL (ref 0.0–0.1)
Basophils Relative: 0.5 % (ref 0.0–3.0)
Eosinophils Absolute: 0.2 K/uL (ref 0.0–0.7)
Eosinophils Relative: 2 % (ref 0.0–5.0)
HCT: 41.2 % (ref 39.0–52.0)
Hemoglobin: 14.1 g/dL (ref 13.0–17.0)
Lymphocytes Relative: 9.9 % — ABNORMAL LOW (ref 12.0–46.0)
Lymphs Abs: 1 K/uL (ref 0.7–4.0)
MCHC: 34.1 g/dL (ref 30.0–36.0)
MCV: 85.3 fl (ref 78.0–100.0)
Monocytes Absolute: 0.7 K/uL (ref 0.1–1.0)
Monocytes Relative: 7.1 % (ref 3.0–12.0)
Neutro Abs: 8 K/uL — ABNORMAL HIGH (ref 1.4–7.7)
Neutrophils Relative %: 80.5 % — ABNORMAL HIGH (ref 43.0–77.0)
Platelets: 220 K/uL (ref 150.0–400.0)
RBC: 4.83 Mil/uL (ref 4.22–5.81)
RDW: 14.4 % (ref 11.5–15.5)
WBC: 9.9 K/uL (ref 4.0–10.5)

## 2024-02-03 LAB — COMPREHENSIVE METABOLIC PANEL WITH GFR
ALT: 14 U/L (ref 0–53)
AST: 15 U/L (ref 0–37)
Albumin: 4.1 g/dL (ref 3.5–5.2)
Alkaline Phosphatase: 70 U/L (ref 39–117)
BUN: 11 mg/dL (ref 6–23)
CO2: 30 meq/L (ref 19–32)
Calcium: 9.2 mg/dL (ref 8.4–10.5)
Chloride: 93 meq/L — ABNORMAL LOW (ref 96–112)
Creatinine, Ser: 0.99 mg/dL (ref 0.40–1.50)
GFR: 78.95 mL/min (ref >60.00)
Glucose, Bld: 154 mg/dL — ABNORMAL HIGH (ref 70–99)
Potassium: 4.1 meq/L (ref 3.5–5.1)
Sodium: 133 meq/L — ABNORMAL LOW (ref 135–145)
Total Bilirubin: 0.5 mg/dL (ref 0.2–1.2)
Total Protein: 6.3 g/dL (ref 6.0–8.3)

## 2024-02-03 LAB — T4, FREE: Free T4: 0.92 ng/dL (ref 0.60–1.60)

## 2024-02-03 LAB — HEMOGLOBIN A1C: Hgb A1c MFr Bld: 7.3 % — ABNORMAL HIGH (ref 4.6–6.5)

## 2024-02-03 LAB — T3, FREE: T3, Free: 2.4 pg/mL (ref 2.3–4.2)

## 2024-02-03 LAB — PSA: PSA: 4.2 ng/mL — ABNORMAL HIGH (ref 0.10–4.00)

## 2024-02-03 LAB — TSH: TSH: 0.44 u[IU]/mL (ref 0.35–5.50)

## 2024-02-03 MED ORDER — LEVOTHYROXINE SODIUM 88 MCG PO TABS: 88.0000 ug | ORAL_TABLET | Freq: Every day | ORAL | 3 refills | Status: AC

## 2024-02-09 ENCOUNTER — Other Ambulatory Visit: Payer: Self-pay | Admitting: Family Medicine

## 2024-02-09 DIAGNOSIS — E669 Obesity, unspecified: Secondary | ICD-10-CM

## 2024-02-11 DIAGNOSIS — C7802 Secondary malignant neoplasm of left lung: Secondary | ICD-10-CM | POA: Diagnosis not present

## 2024-02-11 DIAGNOSIS — Z923 Personal history of irradiation: Secondary | ICD-10-CM | POA: Diagnosis not present

## 2024-02-11 DIAGNOSIS — C770 Secondary and unspecified malignant neoplasm of lymph nodes of head, face and neck: Secondary | ICD-10-CM | POA: Diagnosis not present

## 2024-02-11 DIAGNOSIS — R197 Diarrhea, unspecified: Secondary | ICD-10-CM | POA: Diagnosis not present

## 2024-02-11 DIAGNOSIS — J90 Pleural effusion, not elsewhere classified: Secondary | ICD-10-CM | POA: Diagnosis not present

## 2024-02-11 DIAGNOSIS — M8580 Other specified disorders of bone density and structure, unspecified site: Secondary | ICD-10-CM | POA: Diagnosis not present

## 2024-02-11 DIAGNOSIS — C4441 Basal cell carcinoma of skin of scalp and neck: Secondary | ICD-10-CM | POA: Diagnosis not present

## 2024-02-11 DIAGNOSIS — C7951 Secondary malignant neoplasm of bone: Secondary | ICD-10-CM | POA: Diagnosis not present

## 2024-02-11 DIAGNOSIS — Z9289 Personal history of other medical treatment: Secondary | ICD-10-CM | POA: Diagnosis not present

## 2024-02-11 DIAGNOSIS — R918 Other nonspecific abnormal finding of lung field: Secondary | ICD-10-CM | POA: Diagnosis not present

## 2024-02-11 DIAGNOSIS — E278 Other specified disorders of adrenal gland: Secondary | ICD-10-CM | POA: Diagnosis not present

## 2024-03-02 ENCOUNTER — Other Ambulatory Visit: Payer: Self-pay

## 2024-03-02 ENCOUNTER — Telehealth: Payer: Self-pay | Admitting: *Deleted

## 2024-03-02 DIAGNOSIS — E039 Hypothyroidism, unspecified: Secondary | ICD-10-CM

## 2024-03-02 DIAGNOSIS — G4733 Obstructive sleep apnea (adult) (pediatric): Secondary | ICD-10-CM

## 2024-03-02 NOTE — Telephone Encounter (Signed)
 Copied from CRM #[id number]. Topic: Clinical - Order For Equipment >> Feb 28, 2024  3:01 PM Leila C wrote: Reason for CRM: Patient [phone] states had cpap machine for 8 years now, an error message of cpap machine has an exceeding motor expectations, like the life time is expiring and asking for a new cpap machine order to [facility] phone #[phone] and fax #[phone]. Patient had to cancel in August 2025 with Dr. Neda, because he was called into work. Patient asked should patient make an appointment now or wait for when patient the new cpap machine? Please advise and call back.   Dr MALVA, please advise if okay to go ahead and order new CPAP since current machine is 67 years old. Then can make appt for f/u once has had new machine 31-90 days. Thanks.

## 2024-03-02 NOTE — Telephone Encounter (Signed)
 Can go ahead and place order for the CPAP  Needs to make an appointment to be followed up within 30 to 90 days of receiving the new machine to make sure we can document that he continues to use it and benefiting from it

## 2024-03-02 NOTE — Telephone Encounter (Signed)
 Good afternoon, I called and spoke with patient, I provided the information per Dr. Neda.  I advised him to call and schedule an OV between day 31-90 of the start of using his new CPAP machine.  I looked in [employer] and nothing pulled up with his name.  I was not able to find a download for him and there are no mentions of setting in the OV note.  DR. Neda, Please advise on what you want the settings to be for his CPAP.   Thank you.

## 2024-03-04 ENCOUNTER — Other Ambulatory Visit: Payer: Self-pay | Admitting: Family Medicine

## 2024-03-12 ENCOUNTER — Other Ambulatory Visit: Payer: Self-pay | Admitting: Family Medicine

## 2024-03-13 ENCOUNTER — Other Ambulatory Visit: Payer: Self-pay | Admitting: Family Medicine

## 2024-03-13 DIAGNOSIS — E669 Obesity, unspecified: Secondary | ICD-10-CM

## 2024-03-15 NOTE — Telephone Encounter (Signed)
 DME order  Settings: Auto CPAP 5-20

## 2024-04-17 ENCOUNTER — Other Ambulatory Visit: Payer: Self-pay | Admitting: *Deleted

## 2024-04-17 MED ORDER — ROSUVASTATIN CALCIUM 40 MG PO TABS: 40.0000 mg | ORAL_TABLET | Freq: Every day | ORAL | 0 refills | Status: AC

## 2024-04-28 ENCOUNTER — Telehealth: Payer: Self-pay

## 2024-04-28 NOTE — Telephone Encounter (Signed)
 Copied from CRM #[id number]. Topic: Clinical - Order For Equipment >> Apr 26, 2024 11:40 AM Celestine FALCON wrote: Reason for CRM: Pt sees Dr. Neda and was told he would be receiving a CPAP machine and supplies from [facility]. He said he was to use it for about 30 days and then make a follow up appt to see Dr. Neda to review his CPAP usage, but he stated he never received the machine or supplies. Pt stated the fax number is [phone] for [facility].  Pt would like a new order to be sent as he has waiting since around Christmas time to get this cpap and supplies. Pt's phone number is [phone] ok to leave a vm.   Called and spoke with the pt. Advised pt that order was received by [facility] on 03/27/2024. I have sent community message to [facility] to reach out to the pt.  NFN

## 2024-05-10 ENCOUNTER — Ambulatory Visit: Admitting: Student
